# Patient Record
Sex: Female | Born: 1970 | Race: Black or African American | Hispanic: No | Marital: Married | Smoking: Never smoker
Health system: Southern US, Community
[De-identification: ages and names within clinical notes are randomized; demographics above are authoritative.]

## PROBLEM LIST (undated history)

## (undated) ENCOUNTER — Emergency Department (HOSPITAL_COMMUNITY): Admission: EM | Source: Home / Self Care

## (undated) DIAGNOSIS — Z8042 Family history of malignant neoplasm of prostate: Secondary | ICD-10-CM

## (undated) DIAGNOSIS — R9430 Abnormal result of cardiovascular function study, unspecified: Secondary | ICD-10-CM

## (undated) DIAGNOSIS — Z800 Family history of malignant neoplasm of digestive organs: Secondary | ICD-10-CM

## (undated) DIAGNOSIS — I209 Angina pectoris, unspecified: Secondary | ICD-10-CM

## (undated) DIAGNOSIS — D649 Anemia, unspecified: Secondary | ICD-10-CM

## (undated) DIAGNOSIS — Z803 Family history of malignant neoplasm of breast: Secondary | ICD-10-CM

## (undated) DIAGNOSIS — K5792 Diverticulitis of intestine, part unspecified, without perforation or abscess without bleeding: Secondary | ICD-10-CM

## (undated) DIAGNOSIS — C50919 Malignant neoplasm of unspecified site of unspecified female breast: Secondary | ICD-10-CM

## (undated) DIAGNOSIS — Z808 Family history of malignant neoplasm of other organs or systems: Secondary | ICD-10-CM

## (undated) DIAGNOSIS — M1990 Unspecified osteoarthritis, unspecified site: Secondary | ICD-10-CM

## (undated) HISTORY — DX: Family history of malignant neoplasm of digestive organs: Z80.0

## (undated) HISTORY — DX: Family history of malignant neoplasm of prostate: Z80.42

## (undated) HISTORY — DX: Unspecified osteoarthritis, unspecified site: M19.90

## (undated) HISTORY — DX: Family history of malignant neoplasm of other organs or systems: Z80.8

## (undated) HISTORY — DX: Malignant neoplasm of unspecified site of unspecified female breast: C50.919

## (undated) HISTORY — DX: Abnormal result of cardiovascular function study, unspecified: R94.30

## (undated) HISTORY — DX: Diverticulitis of intestine, part unspecified, without perforation or abscess without bleeding: K57.92

## (undated) HISTORY — PX: TUBAL LIGATION: SHX77

## (undated) HISTORY — PX: CARDIAC CATHETERIZATION: SHX172

## (undated) HISTORY — DX: Family history of malignant neoplasm of breast: Z80.3

---

## 1998-05-15 ENCOUNTER — Inpatient Hospital Stay (HOSPITAL_COMMUNITY): Admission: AD | Admit: 1998-05-15 | Discharge: 1998-05-15 | Payer: Self-pay | Admitting: Obstetrics

## 1998-07-02 ENCOUNTER — Emergency Department (HOSPITAL_COMMUNITY): Admission: EM | Admit: 1998-07-02 | Discharge: 1998-07-02 | Payer: Self-pay | Admitting: Emergency Medicine

## 1998-08-25 ENCOUNTER — Ambulatory Visit (HOSPITAL_COMMUNITY): Admission: RE | Admit: 1998-08-25 | Discharge: 1998-08-25 | Payer: Self-pay | Admitting: Obstetrics

## 1998-11-02 ENCOUNTER — Inpatient Hospital Stay (HOSPITAL_COMMUNITY): Admission: AD | Admit: 1998-11-02 | Discharge: 1998-11-02 | Payer: Self-pay | Admitting: Obstetrics

## 1998-11-21 ENCOUNTER — Encounter: Payer: Self-pay | Admitting: *Deleted

## 1998-11-21 ENCOUNTER — Emergency Department (HOSPITAL_COMMUNITY): Admission: EM | Admit: 1998-11-21 | Discharge: 1998-11-21 | Payer: Self-pay | Admitting: *Deleted

## 1998-12-24 ENCOUNTER — Inpatient Hospital Stay (HOSPITAL_COMMUNITY): Admission: AD | Admit: 1998-12-24 | Discharge: 1998-12-24 | Payer: Self-pay | Admitting: Obstetrics

## 1999-04-15 ENCOUNTER — Emergency Department (HOSPITAL_COMMUNITY): Admission: EM | Admit: 1999-04-15 | Discharge: 1999-04-15 | Payer: Self-pay | Admitting: *Deleted

## 1999-06-18 ENCOUNTER — Inpatient Hospital Stay (HOSPITAL_COMMUNITY): Admission: AD | Admit: 1999-06-18 | Discharge: 1999-06-18 | Payer: Self-pay | Admitting: Obstetrics

## 1999-09-29 ENCOUNTER — Encounter: Payer: Self-pay | Admitting: Obstetrics

## 1999-09-29 ENCOUNTER — Inpatient Hospital Stay (HOSPITAL_COMMUNITY): Admission: AD | Admit: 1999-09-29 | Discharge: 1999-10-02 | Payer: Self-pay | Admitting: Obstetrics

## 1999-11-20 ENCOUNTER — Emergency Department (HOSPITAL_COMMUNITY): Admission: EM | Admit: 1999-11-20 | Discharge: 1999-11-20 | Payer: Self-pay | Admitting: Emergency Medicine

## 2000-05-12 ENCOUNTER — Other Ambulatory Visit: Admission: RE | Admit: 2000-05-12 | Discharge: 2000-05-12 | Payer: Self-pay | Admitting: Obstetrics

## 2000-07-19 ENCOUNTER — Other Ambulatory Visit: Admission: RE | Admit: 2000-07-19 | Discharge: 2000-07-19 | Payer: Self-pay | Admitting: Obstetrics

## 2000-08-16 ENCOUNTER — Emergency Department (HOSPITAL_COMMUNITY): Admission: EM | Admit: 2000-08-16 | Discharge: 2000-08-16 | Payer: Self-pay | Admitting: Emergency Medicine

## 2000-11-02 ENCOUNTER — Inpatient Hospital Stay (HOSPITAL_COMMUNITY): Admission: AD | Admit: 2000-11-02 | Discharge: 2000-11-02 | Payer: Self-pay | Admitting: Obstetrics

## 2001-04-26 ENCOUNTER — Other Ambulatory Visit: Admission: RE | Admit: 2001-04-26 | Discharge: 2001-04-26 | Payer: Self-pay | Admitting: Obstetrics

## 2001-11-04 ENCOUNTER — Encounter: Payer: Self-pay | Admitting: Obstetrics

## 2001-11-04 ENCOUNTER — Inpatient Hospital Stay (HOSPITAL_COMMUNITY): Admission: AD | Admit: 2001-11-04 | Discharge: 2001-11-04 | Payer: Self-pay | Admitting: Obstetrics

## 2002-03-29 ENCOUNTER — Inpatient Hospital Stay (HOSPITAL_COMMUNITY): Admission: AD | Admit: 2002-03-29 | Discharge: 2002-03-29 | Payer: Self-pay | Admitting: Obstetrics

## 2002-06-26 ENCOUNTER — Encounter (INDEPENDENT_AMBULATORY_CARE_PROVIDER_SITE_OTHER): Payer: Self-pay | Admitting: Specialist

## 2002-06-26 ENCOUNTER — Inpatient Hospital Stay (HOSPITAL_COMMUNITY): Admission: AD | Admit: 2002-06-26 | Discharge: 2002-06-28 | Payer: Self-pay | Admitting: Obstetrics

## 2003-03-11 ENCOUNTER — Encounter: Payer: Self-pay | Admitting: Emergency Medicine

## 2003-03-11 ENCOUNTER — Emergency Department (HOSPITAL_COMMUNITY): Admission: EM | Admit: 2003-03-11 | Discharge: 2003-03-11 | Payer: Self-pay | Admitting: Emergency Medicine

## 2003-10-05 ENCOUNTER — Emergency Department (HOSPITAL_COMMUNITY): Admission: AD | Admit: 2003-10-05 | Discharge: 2003-10-05 | Payer: Self-pay | Admitting: Family Medicine

## 2004-09-14 ENCOUNTER — Emergency Department (HOSPITAL_COMMUNITY): Admission: EM | Admit: 2004-09-14 | Discharge: 2004-09-14 | Payer: Self-pay | Admitting: *Deleted

## 2005-03-17 ENCOUNTER — Emergency Department (HOSPITAL_COMMUNITY): Admission: EM | Admit: 2005-03-17 | Discharge: 2005-03-18 | Payer: Self-pay | Admitting: Emergency Medicine

## 2005-05-28 ENCOUNTER — Emergency Department (HOSPITAL_COMMUNITY): Admission: EM | Admit: 2005-05-28 | Discharge: 2005-05-28 | Payer: Self-pay | Admitting: Family Medicine

## 2005-06-17 ENCOUNTER — Emergency Department (HOSPITAL_COMMUNITY): Admission: EM | Admit: 2005-06-17 | Discharge: 2005-06-17 | Payer: Self-pay | Admitting: Family Medicine

## 2006-02-17 ENCOUNTER — Emergency Department (HOSPITAL_COMMUNITY): Admission: EM | Admit: 2006-02-17 | Discharge: 2006-02-17 | Payer: Self-pay | Admitting: Emergency Medicine

## 2006-03-19 ENCOUNTER — Emergency Department (HOSPITAL_COMMUNITY): Admission: EM | Admit: 2006-03-19 | Discharge: 2006-03-20 | Payer: Self-pay | Admitting: Emergency Medicine

## 2006-11-01 ENCOUNTER — Emergency Department (HOSPITAL_COMMUNITY): Admission: EM | Admit: 2006-11-01 | Discharge: 2006-11-01 | Payer: Self-pay | Admitting: Family Medicine

## 2006-12-23 ENCOUNTER — Emergency Department (HOSPITAL_COMMUNITY): Admission: EM | Admit: 2006-12-23 | Discharge: 2006-12-23 | Payer: Self-pay | Admitting: Family Medicine

## 2007-08-13 ENCOUNTER — Emergency Department (HOSPITAL_COMMUNITY): Admission: EM | Admit: 2007-08-13 | Discharge: 2007-08-13 | Payer: Self-pay | Admitting: Family Medicine

## 2007-11-16 ENCOUNTER — Emergency Department (HOSPITAL_COMMUNITY): Admission: EM | Admit: 2007-11-16 | Discharge: 2007-11-16 | Payer: Self-pay | Admitting: Emergency Medicine

## 2008-01-09 ENCOUNTER — Emergency Department (HOSPITAL_COMMUNITY): Admission: EM | Admit: 2008-01-09 | Discharge: 2008-01-09 | Payer: Self-pay | Admitting: Family Medicine

## 2008-02-27 ENCOUNTER — Emergency Department (HOSPITAL_COMMUNITY): Admission: EM | Admit: 2008-02-27 | Discharge: 2008-02-28 | Payer: Self-pay | Admitting: Emergency Medicine

## 2008-05-03 ENCOUNTER — Emergency Department (HOSPITAL_COMMUNITY): Admission: EM | Admit: 2008-05-03 | Discharge: 2008-05-03 | Payer: Self-pay | Admitting: Emergency Medicine

## 2008-08-09 ENCOUNTER — Emergency Department (HOSPITAL_COMMUNITY): Admission: EM | Admit: 2008-08-09 | Discharge: 2008-08-09 | Payer: Self-pay | Admitting: Family Medicine

## 2008-09-01 ENCOUNTER — Emergency Department (HOSPITAL_COMMUNITY): Admission: EM | Admit: 2008-09-01 | Discharge: 2008-09-01 | Payer: Self-pay | Admitting: Emergency Medicine

## 2008-09-08 ENCOUNTER — Inpatient Hospital Stay (HOSPITAL_COMMUNITY): Admission: AD | Admit: 2008-09-08 | Discharge: 2008-09-08 | Payer: Self-pay | Admitting: Obstetrics

## 2008-10-24 ENCOUNTER — Emergency Department (HOSPITAL_COMMUNITY): Admission: EM | Admit: 2008-10-24 | Discharge: 2008-10-24 | Payer: Self-pay | Admitting: Family Medicine

## 2008-11-01 ENCOUNTER — Emergency Department (HOSPITAL_COMMUNITY): Admission: EM | Admit: 2008-11-01 | Discharge: 2008-11-01 | Payer: Self-pay | Admitting: Emergency Medicine

## 2008-11-06 ENCOUNTER — Encounter (INDEPENDENT_AMBULATORY_CARE_PROVIDER_SITE_OTHER): Payer: Self-pay | Admitting: Family Medicine

## 2008-11-12 ENCOUNTER — Encounter (INDEPENDENT_AMBULATORY_CARE_PROVIDER_SITE_OTHER): Payer: Self-pay | Admitting: Family Medicine

## 2008-11-12 ENCOUNTER — Ambulatory Visit (HOSPITAL_COMMUNITY): Admission: RE | Admit: 2008-11-12 | Discharge: 2008-11-12 | Payer: Self-pay | Admitting: Obstetrics

## 2009-01-13 ENCOUNTER — Emergency Department (HOSPITAL_COMMUNITY): Admission: EM | Admit: 2009-01-13 | Discharge: 2009-01-13 | Payer: Self-pay | Admitting: Family Medicine

## 2009-01-14 ENCOUNTER — Ambulatory Visit: Payer: Self-pay | Admitting: Family Medicine

## 2009-01-14 DIAGNOSIS — D649 Anemia, unspecified: Secondary | ICD-10-CM

## 2009-01-15 ENCOUNTER — Ambulatory Visit: Payer: Self-pay | Admitting: Family Medicine

## 2009-01-15 ENCOUNTER — Encounter (INDEPENDENT_AMBULATORY_CARE_PROVIDER_SITE_OTHER): Payer: Self-pay | Admitting: Family Medicine

## 2009-01-15 DIAGNOSIS — E669 Obesity, unspecified: Secondary | ICD-10-CM | POA: Insufficient documentation

## 2009-01-15 DIAGNOSIS — L659 Nonscarring hair loss, unspecified: Secondary | ICD-10-CM | POA: Insufficient documentation

## 2009-01-16 LAB — CONVERTED CEMR LAB
AST: 15 units/L (ref 0–37)
Albumin: 4.3 g/dL (ref 3.5–5.2)
Alkaline Phosphatase: 49 units/L (ref 39–117)
Calcium: 8.7 mg/dL (ref 8.4–10.5)
Chloride: 111 meq/L (ref 96–112)
Cholesterol: 143 mg/dL (ref 0–200)
Creatinine, Ser: 0.83 mg/dL (ref 0.40–1.20)
HDL: 44 mg/dL (ref >39)
Hemoglobin: 10 g/dL — ABNORMAL LOW (ref 12.0–15.0)
MCV: 77 fL — ABNORMAL LOW (ref 78.0–100.0)
RBC: 4.05 M/uL (ref 3.87–5.11)
TIBC: 449 ug/dL (ref 250–470)
TSH: 1.253 microintl units/mL (ref 0.350–4.50)
Total Bilirubin: 0.8 mg/dL (ref 0.3–1.2)
Total CHOL/HDL Ratio: 3.3
Total Protein: 6.9 g/dL (ref 6.0–8.3)
Triglycerides: 42 mg/dL (ref <150)
WBC: 3.8 10*3/uL — ABNORMAL LOW (ref 4.0–10.5)

## 2009-04-07 ENCOUNTER — Emergency Department (HOSPITAL_COMMUNITY): Admission: EM | Admit: 2009-04-07 | Discharge: 2009-04-07 | Payer: Self-pay | Admitting: Emergency Medicine

## 2009-12-20 ENCOUNTER — Emergency Department (HOSPITAL_COMMUNITY): Admission: EM | Admit: 2009-12-20 | Discharge: 2009-12-20 | Payer: Self-pay | Admitting: Emergency Medicine

## 2010-03-01 ENCOUNTER — Emergency Department (HOSPITAL_COMMUNITY): Admission: EM | Admit: 2010-03-01 | Discharge: 2010-03-02 | Payer: Self-pay | Admitting: Emergency Medicine

## 2010-07-18 ENCOUNTER — Emergency Department (HOSPITAL_COMMUNITY): Admission: EM | Admit: 2010-07-18 | Discharge: 2010-07-18 | Payer: Self-pay | Admitting: Family Medicine

## 2010-10-31 ENCOUNTER — Emergency Department (HOSPITAL_COMMUNITY)
Admission: EM | Admit: 2010-10-31 | Discharge: 2010-10-31 | Payer: Self-pay | Source: Home / Self Care | Admitting: Family Medicine

## 2011-01-25 LAB — GC/CHLAMYDIA PROBE AMP, GENITAL
Chlamydia, DNA Probe: NEGATIVE
GC Probe Amp, Genital: NEGATIVE

## 2011-01-25 LAB — POCT PREGNANCY, URINE: Preg Test, Ur: NEGATIVE

## 2011-01-25 LAB — WET PREP, GENITAL: Clue Cells Wet Prep HPF POC: NONE SEEN

## 2011-01-25 LAB — POCT URINALYSIS DIPSTICK
Bilirubin Urine: NEGATIVE
Ketones, ur: NEGATIVE mg/dL
Nitrite: NEGATIVE
Protein, ur: NEGATIVE mg/dL
Urobilinogen, UA: 0.2 mg/dL (ref 0.0–1.0)

## 2011-01-28 LAB — POCT URINALYSIS DIPSTICK
Ketones, ur: NEGATIVE mg/dL
Nitrite: NEGATIVE
Specific Gravity, Urine: <=1.005 (ref 1.005–1.030)
Urobilinogen, UA: 0.2 mg/dL (ref 0.0–1.0)

## 2011-01-28 LAB — WET PREP, GENITAL: Yeast Wet Prep HPF POC: NONE SEEN

## 2011-01-28 LAB — URINE CULTURE

## 2011-01-28 LAB — GC/CHLAMYDIA PROBE AMP, GENITAL
Chlamydia, DNA Probe: NEGATIVE
GC Probe Amp, Genital: NEGATIVE

## 2011-02-02 LAB — BASIC METABOLIC PANEL WITH GFR
GFR calc Af Amer: >60 mL/min (ref >60)
GFR calc non Af Amer: >60 mL/min (ref >60)
Glucose, Bld: 97 mg/dL (ref 70–99)
Sodium: 139 meq/L (ref 135–145)

## 2011-02-02 LAB — BASIC METABOLIC PANEL
BUN: 11 mg/dL (ref 6–23)
CO2: 25 mEq/L (ref 19–32)
Calcium: 9.2 mg/dL (ref 8.4–10.5)
Chloride: 107 mEq/L (ref 96–112)
Creatinine, Ser: 0.92 mg/dL (ref 0.4–1.2)
Potassium: 3.7 mEq/L (ref 3.5–5.1)

## 2011-02-02 LAB — CBC
HCT: 27.8 % — ABNORMAL LOW (ref 36.0–46.0)
Hemoglobin: 9.3 g/dL — ABNORMAL LOW (ref 12.0–15.0)
MCHC: 33.4 g/dL (ref 30.0–36.0)
MCV: 76.1 fL — ABNORMAL LOW (ref 78.0–100.0)
Platelets: 357 10*3/uL (ref 150–400)
RBC: 3.65 MIL/uL — ABNORMAL LOW (ref 3.87–5.11)
RDW: 16.8 % — ABNORMAL HIGH (ref 11.5–15.5)
WBC: 6.8 10*3/uL (ref 4.0–10.5)

## 2011-02-02 LAB — POCT CARDIAC MARKERS
CKMB, poc: <1 ng/mL — ABNORMAL LOW (ref 1.0–8.0)
CKMB, poc: <1 ng/mL — ABNORMAL LOW (ref 1.0–8.0)
Myoglobin, poc: 89.5 ng/mL (ref 12–200)
Myoglobin, poc: 98.1 ng/mL (ref 12–200)
Troponin i, poc: <0.05 ng/mL (ref 0.00–0.09)
Troponin i, poc: <0.05 ng/mL (ref 0.00–0.09)

## 2011-03-02 ENCOUNTER — Inpatient Hospital Stay (HOSPITAL_COMMUNITY)
Admission: RE | Admit: 2011-03-02 | Discharge: 2011-03-02 | Disposition: A | Discharge status: Home / Self Care | Payer: Self-pay | Source: Ambulatory Visit | Attending: Family Medicine | Admitting: Family Medicine

## 2011-04-02 NOTE — Op Note (Signed)
   Sheila [name], DUNBAR NO.:  [id number]   MEDICAL RECORD NO.:  [id number]                   PATIENT TYPE:  INP   LOCATION:  9144                                 FACILITY:  [facility]   PHYSICIAN:  Kathreen Cosier, M.D.           DATE OF BIRTH:  1971/07/11   DATE OF PROCEDURE:  06/27/2002  DATE OF DISCHARGE:                                 OPERATIVE REPORT   PREOPERATIVE DIAGNOSIS:  Multiparity.   POSTOPERATIVE DIAGNOSIS:  Multiparity.   PROCEDURE:  Postpartum tubal ligation.   SURGEON:  Kathreen Cosier, M.D.   DESCRIPTION OF PROCEDURE:  Under general anesthesia, patient in the supine  position, abdomen prepped and draped.  Bladder emptied with a straight  catheter.  Midline subumbilical incision 1 inch long was made and carried  down to the fascia.  The fascia was cleaned.  The fascia and peritoneum were  opened with the Mayo scissors.  The left tube was grasped in the midportion  with the Babcock clamp.  The tube was traced to the fimbria.  A 0 plain  suture was placed in the mesosalpinx below the portion of the tube within  the clamp, and this was tied and approximately one inch of tube transected.  The procedure was done in the exact fashion on the other side.  The counts  were correct.  Hemostasis was satisfactory.  The abdomen was closed in  layers, the peritoneum and fascia with continuous suture of 0 Dexon, and the  skin closed with subcuticular sutures of 3-0 plain.  The patient tolerated  the procedure well and was taken to the recovery room in good condition.                                               Kathreen Cosier, M.D.    BAM/MEDQ  D:  06/27/2002  T:  06/27/2002  Job:  [id number] [id number]
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
## 2011-04-05 ENCOUNTER — Inpatient Hospital Stay (INDEPENDENT_AMBULATORY_CARE_PROVIDER_SITE_OTHER)
Admission: RE | Admit: 2011-04-05 | Discharge: 2011-04-05 | Disposition: A | Discharge status: Home / Self Care | Payer: Medicaid Other | Source: Ambulatory Visit | Attending: Emergency Medicine | Admitting: Emergency Medicine

## 2011-04-05 DIAGNOSIS — T7840XA Allergy, unspecified, initial encounter: Secondary | ICD-10-CM

## 2011-08-04 LAB — HERPES SIMPLEX VIRUS CULTURE: Culture: DETECTED

## 2011-08-16 LAB — POCT URINALYSIS DIP (DEVICE)
Bilirubin Urine: NEGATIVE
Nitrite: NEGATIVE
Operator id: 235561
Protein, ur: NEGATIVE
pH: 6.5

## 2011-08-16 LAB — GC/CHLAMYDIA PROBE AMP, GENITAL
Chlamydia, DNA Probe: NEGATIVE
GC Probe Amp, Genital: NEGATIVE

## 2011-08-16 LAB — POCT PREGNANCY, URINE: Preg Test, Ur: NEGATIVE

## 2011-08-17 LAB — URINALYSIS, ROUTINE W REFLEX MICROSCOPIC
Glucose, UA: NEGATIVE
pH: 6

## 2011-08-17 LAB — POCT PREGNANCY, URINE: Preg Test, Ur: NEGATIVE

## 2011-08-17 LAB — WET PREP, GENITAL
Trich, Wet Prep: NONE SEEN
Yeast Wet Prep HPF POC: NONE SEEN

## 2011-08-19 LAB — POCT URINALYSIS DIP (DEVICE)
Ketones, ur: NEGATIVE mg/dL
Protein, ur: NEGATIVE mg/dL
Specific Gravity, Urine: 1.01 (ref 1.005–1.030)
pH: 5.5 (ref 5.0–8.0)

## 2011-08-19 LAB — POCT PREGNANCY, URINE: Preg Test, Ur: NEGATIVE

## 2011-08-24 ENCOUNTER — Emergency Department (HOSPITAL_COMMUNITY)
Admission: EM | Admit: 2011-08-24 | Discharge: 2011-08-25 | Disposition: A | Discharge status: Home / Self Care | Payer: Medicaid Other | Attending: Emergency Medicine | Admitting: Emergency Medicine

## 2011-08-24 DIAGNOSIS — L02419 Cutaneous abscess of limb, unspecified: Secondary | ICD-10-CM | POA: Insufficient documentation

## 2011-08-24 DIAGNOSIS — M25569 Pain in unspecified knee: Secondary | ICD-10-CM | POA: Insufficient documentation

## 2012-04-15 ENCOUNTER — Encounter (HOSPITAL_COMMUNITY): Payer: Self-pay

## 2012-04-15 ENCOUNTER — Emergency Department (INDEPENDENT_AMBULATORY_CARE_PROVIDER_SITE_OTHER)
Admission: EM | Admit: 2012-04-15 | Discharge: 2012-04-15 | Disposition: A | Discharge status: Home / Self Care | Payer: Self-pay | Source: Home / Self Care | Attending: Emergency Medicine | Admitting: Emergency Medicine

## 2012-04-15 DIAGNOSIS — S139XXA Sprain of joints and ligaments of unspecified parts of neck, initial encounter: Secondary | ICD-10-CM

## 2012-04-15 DIAGNOSIS — M546 Pain in thoracic spine: Secondary | ICD-10-CM

## 2012-04-15 DIAGNOSIS — T148XXA Other injury of unspecified body region, initial encounter: Secondary | ICD-10-CM

## 2012-04-15 DIAGNOSIS — S161XXA Strain of muscle, fascia and tendon at neck level, initial encounter: Secondary | ICD-10-CM

## 2012-04-15 MED ORDER — CYCLOBENZAPRINE HCL 10 MG PO TABS: 10.0000 mg | ORAL_TABLET | Freq: Three times a day (TID) | ORAL | Status: AC | PRN

## 2012-04-15 MED ORDER — MELOXICAM 7.5 MG PO TABS: 7.5000 mg | ORAL_TABLET | Freq: Every day | ORAL | Status: DC
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
## 2012-04-15 NOTE — Discharge Instructions (Signed)
AS. Discussed if your pain persists beyond 2 weeks Dr. Dr. as you might need further evaluation and perhaps guided physical therapy. Your exam today was suggestive of multiple muscular sprains and strains. These medicines should help you with your current discomfort.   Back Pain, Adult Low back pain is very common. About 1 in 5 people have back pain.The cause of low back pain is rarely dangerous. The pain often gets better over time.About half of people with a sudden onset of back pain feel better in just 2 weeks. About 8 in 10 people feel better by 6 weeks.  CAUSES Some common causes of back pain include:  Strain of the muscles or ligaments supporting the spine.   Wear and tear (degeneration) of the spinal discs.   Arthritis.   Direct injury to the back.  DIAGNOSIS Most of the time, the direct cause of low back pain is not known.However, back pain can be treated effectively even when the exact cause of the pain is unknown.Answering your caregiver's questions about your overall health and symptoms is one of the most accurate ways to make sure the cause of your pain is not dangerous. If your caregiver needs more information, he or she may order lab work or imaging tests (X-rays or MRIs).However, even if imaging tests show changes in your back, this usually does not require surgery. HOME CARE INSTRUCTIONS For many people, back pain returns.Since low back pain is rarely dangerous, it is often a condition that people can learn to [facility] their own.   Remain active. It is stressful on the back to sit or stand in one place. Do not sit, drive, or stand in one place for more than 30 minutes at a time. Take short walks on level surfaces as soon as pain allows.Try to increase the length of time you walk each day.   Do not stay in bed.Resting more than 1 or 2 days can delay your recovery.   Do not avoid exercise or work.Your body is made to move.It is not dangerous to be active, even  though your back may hurt.Your back will likely heal faster if you return to being active before your pain is gone.   Pay attention to your body when you bend and lift. Many people have less discomfortwhen lifting if they bend their knees, keep the load close to their bodies,and avoid twisting. Often, the most comfortable positions are those that put less stress on your recovering back.   Find a comfortable position to sleep. Use a firm mattress and lie on your side with your knees slightly bent. If you lie on your back, put a pillow under your knees.   Only take over-the-counter or prescription medicines as directed by your caregiver. Over-the-counter medicines to reduce pain and inflammation are often the most helpful.Your caregiver may prescribe muscle relaxant drugs.These medicines help dull your pain so you can more quickly return to your normal activities and healthy exercise.   Put ice on the injured area.   Put ice in a plastic bag.   Place a towel between your skin and the bag.   Leave the ice on for 15 to 20 minutes, 3 to 4 times a day for the first 2 to 3 days. After that, ice and heat may be alternated to reduce pain and spasms.   Ask your caregiver about trying back exercises and gentle massage. This may be of some benefit.   Avoid feeling anxious or stressed.Stress increases muscle tension and can  worsen back pain.It is important to recognize when you are anxious or stressed and learn ways to manage it.Exercise is a great option.  SEEK MEDICAL CARE IF:  You have pain that is not relieved with rest or medicine.   You have pain that does not improve in 1 week.   You have new symptoms.   You are generally not feeling well.  SEEK IMMEDIATE MEDICAL CARE IF:   You have pain that radiates from your back into your legs.   You develop new bowel or bladder control problems.   You have unusual weakness or numbness in your arms or legs.   You develop nausea or vomiting.    You develop abdominal pain.   You feel faint.  Document Released: 11/01/2005 Document Revised: 10/21/2011 Document Reviewed: 03/22/2011 [facility] Patient Information 2012 [place], [place].

## 2012-05-25 ENCOUNTER — Encounter (HOSPITAL_COMMUNITY): Payer: Self-pay | Admitting: Emergency Medicine

## 2012-05-25 DIAGNOSIS — R110 Nausea: Secondary | ICD-10-CM | POA: Insufficient documentation

## 2012-05-25 DIAGNOSIS — R51 Headache: Secondary | ICD-10-CM | POA: Insufficient documentation

## 2012-05-25 DIAGNOSIS — R209 Unspecified disturbances of skin sensation: Secondary | ICD-10-CM | POA: Insufficient documentation

## 2012-05-25 DIAGNOSIS — F411 Generalized anxiety disorder: Secondary | ICD-10-CM | POA: Insufficient documentation

## 2012-05-25 DIAGNOSIS — R61 Generalized hyperhidrosis: Secondary | ICD-10-CM | POA: Insufficient documentation

## 2012-05-25 DIAGNOSIS — R064 Hyperventilation: Secondary | ICD-10-CM | POA: Insufficient documentation

## 2012-05-25 LAB — CBC WITH DIFFERENTIAL/PLATELET
Basophils Absolute: 0 10*3/uL (ref 0.0–0.1)
Eosinophils Relative: 2 % (ref 0–5)
HCT: 32.2 % — ABNORMAL LOW (ref 36.0–46.0)
Lymphocytes Relative: 26 % (ref 12–46)
MCV: 77.4 fL — ABNORMAL LOW (ref 78.0–100.0)
Monocytes Absolute: 0.7 10*3/uL (ref 0.1–1.0)
RDW: 15.4 % (ref 11.5–15.5)
WBC: 7.4 10*3/uL (ref 4.0–10.5)

## 2012-05-25 LAB — POCT I-STAT, CHEM 8
BUN: 13 mg/dL (ref 6–23)
Calcium, Ion: 1.23 mmol/L (ref 1.12–1.23)
Creatinine, Ser: 1 mg/dL (ref 0.50–1.10)
Glucose, Bld: 95 mg/dL (ref 70–99)
TCO2: 23 mmol/L (ref 0–100)

## 2012-05-25 NOTE — ED Notes (Signed)
Patient with shortness of breath and sudden onset of sharp pain and headache.  Patient states she felt sweaty and feels like she is going to pass out.

## 2012-05-26 ENCOUNTER — Encounter (HOSPITAL_COMMUNITY): Payer: Self-pay | Admitting: Radiology

## 2012-05-26 ENCOUNTER — Emergency Department (HOSPITAL_COMMUNITY): Payer: Medicaid Other

## 2012-05-26 ENCOUNTER — Emergency Department (HOSPITAL_COMMUNITY)
Admission: EM | Admit: 2012-05-26 | Discharge: 2012-05-26 | Disposition: A | Discharge status: Home / Self Care | Payer: Medicaid Other | Attending: Emergency Medicine | Admitting: Emergency Medicine

## 2012-05-26 DIAGNOSIS — R51 Headache: Secondary | ICD-10-CM

## 2012-05-26 HISTORY — DX: Anemia, unspecified: D64.9

## 2012-05-26 LAB — POCT I-STAT TROPONIN I: Troponin i, poc: 0 ng/mL (ref 0.00–0.08)

## 2012-05-26 NOTE — ED Notes (Signed)
Called pt to re-assess.  No response.  Will call again.
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
## 2012-05-30 ENCOUNTER — Other Ambulatory Visit (HOSPITAL_COMMUNITY): Payer: Self-pay | Admitting: Obstetrics

## 2012-05-30 DIAGNOSIS — Z1231 Encounter for screening mammogram for malignant neoplasm of breast: Secondary | ICD-10-CM

## 2012-06-16 ENCOUNTER — Ambulatory Visit (HOSPITAL_COMMUNITY): Payer: Medicaid Other | Attending: Obstetrics

## 2012-08-14 ENCOUNTER — Ambulatory Visit (HOSPITAL_COMMUNITY): Payer: Medicaid Other

## 2012-08-15 ENCOUNTER — Ambulatory Visit (HOSPITAL_COMMUNITY)
Admission: RE | Admit: 2012-08-15 | Discharge: 2012-08-15 | Disposition: A | Discharge status: Home / Self Care | Payer: Medicaid Other | Source: Ambulatory Visit | Attending: Obstetrics | Admitting: Obstetrics

## 2012-08-15 DIAGNOSIS — Z1231 Encounter for screening mammogram for malignant neoplasm of breast: Secondary | ICD-10-CM | POA: Insufficient documentation

## 2013-04-25 ENCOUNTER — Encounter (HOSPITAL_COMMUNITY): Payer: Self-pay | Admitting: Emergency Medicine

## 2013-04-25 ENCOUNTER — Emergency Department (HOSPITAL_COMMUNITY)
Admission: EM | Admit: 2013-04-25 | Discharge: 2013-04-25 | Disposition: A | Discharge status: Home / Self Care | Payer: Medicaid Other | Source: Home / Self Care

## 2013-04-25 DIAGNOSIS — IMO0002 Reserved for concepts with insufficient information to code with codable children: Secondary | ICD-10-CM

## 2013-04-25 DIAGNOSIS — S43401A Unspecified sprain of right shoulder joint, initial encounter: Secondary | ICD-10-CM

## 2013-04-25 MED ORDER — HYDROCODONE-ACETAMINOPHEN 2.5-500 MG PO TABS: 1.0000 | ORAL_TABLET | Freq: Four times a day (QID) | ORAL | Status: DC | PRN

## 2013-04-25 MED ORDER — NAPROXEN 375 MG PO TABS: 375.0000 mg | ORAL_TABLET | Freq: Two times a day (BID) | ORAL | Status: DC

## 2013-04-25 NOTE — ED Provider Notes (Signed)
History     CSN: [id number]  Arrival date & time 04/25/13  1855   None     Chief Complaint  Patient presents with  . Shoulder Pain    (Consider location/radiation/quality/duration/timing/severity/associated sxs/prior treatment) Patient is a 42 y.o. female presenting with shoulder pain.  Shoulder Pain   This is a 42 year old female who injured her left shoulder while trying to move a mattress a few weeks ago. She states that it is not improving in fact the pain is worse now than it has been in the past few weeks. Pain is essentially in the upper part of the shoulder and radiates down the arm to her hand. Past Medical History  Diagnosis Date  . Anemia     History reviewed. No pertinent past surgical history.  No family history on file.  History  Substance Use Topics  . Smoking status: Never Smoker   . Smokeless tobacco: Not on file  . Alcohol Use: No    OB History   Grav Para Term Preterm Abortions TAB SAB Ect Mult Living                  Review of Systems  Constitutional: Negative.   HENT: Negative.   Eyes: Negative.   Respiratory: Negative.   Cardiovascular: Negative.   Gastrointestinal: Negative.   Genitourinary: Negative.   Skin: Negative.   Neurological: Negative.   Hematological: Negative.   Psychiatric/Behavioral: Negative.     Allergies  Review of patient's allergies indicates no known allergies.  Home Medications   Current Outpatient Rx  Name  Route  Sig  Dispense  Refill  . HYDROcodone-acetaminophen (VICODIN) 2.5-500 MG per tablet   Oral   Take 1 tablet by mouth every 6 (six) hours as needed for pain.   30 tablet   0   . naproxen (NAPROSYN) 375 MG tablet   Oral   Take 1 tablet (375 mg total) by mouth 2 (two) times daily with a meal.   60 tablet   0     BP 149/88  Pulse 90  Temp(Src) 98.8 F (37.1 C) (Oral)  Resp 18  SpO2 98%  LMP 04/15/2013  Physical Exam  Constitutional: She is oriented to person, place, and time. She  appears well-developed and well-nourished.  HENT:  Head: Normocephalic and atraumatic.  Eyes: Conjunctivae are normal. Pupils are equal, round, and reactive to light.  Neck: Normal range of motion. Neck supple.  Cardiovascular: Normal rate and regular rhythm.   Pulmonary/Chest: Effort normal and breath sounds normal.  Abdominal: Soft. Bowel sounds are normal.  Musculoskeletal:  Tenderness in the upper outer aspect of left shoulder is noted. There is no mass. Patient is able to lift her arm above her head although this causes a significant exacerbation in her pain. She is also able to flex and extend at the shoulder without any significant difficulty other than moderate pain.  Neurological: She is alert and oriented to person, place, and time.  Skin: Skin is warm.  Psychiatric: She has a normal mood and affect. Her behavior is normal.    ED Course  Procedures (including critical care time)  Labs Reviewed - No data to display No results found.   1. Shoulder sprain, right, initial encounter       MDM  Naprosyn, Vicodin for pain and inflammation. I've also ordered a sling and given general instructions regarding rest and ice. Followup with orthopod if improvement is not noted over the next one to 2 weeks.  Calvert Cantor, MD 04/25/13 1945

## 2013-04-25 NOTE — ED Notes (Signed)
Left shoulder pain for one week, pain has been increasing over the week. Patient reports pain onset after moving her bed/mattress. Left hand numbness and tingling

## 2013-06-26 ENCOUNTER — Inpatient Hospital Stay (HOSPITAL_COMMUNITY)
Admission: AD | Admit: 2013-06-26 | Discharge: 2013-06-26 | Disposition: A | Discharge status: Home / Self Care | Payer: Medicaid Other | Source: Ambulatory Visit | Attending: Obstetrics and Gynecology | Admitting: Obstetrics and Gynecology

## 2013-06-26 ENCOUNTER — Inpatient Hospital Stay (HOSPITAL_COMMUNITY): Payer: Medicaid Other

## 2013-06-26 ENCOUNTER — Encounter (HOSPITAL_COMMUNITY): Payer: Self-pay | Admitting: *Deleted

## 2013-06-26 DIAGNOSIS — M545 Low back pain, unspecified: Secondary | ICD-10-CM | POA: Insufficient documentation

## 2013-06-26 DIAGNOSIS — R109 Unspecified abdominal pain: Secondary | ICD-10-CM | POA: Insufficient documentation

## 2013-06-26 DIAGNOSIS — N926 Irregular menstruation, unspecified: Secondary | ICD-10-CM

## 2013-06-26 LAB — URINE MICROSCOPIC-ADD ON

## 2013-06-26 LAB — WET PREP, GENITAL
Clue Cells Wet Prep HPF POC: NONE SEEN
Trich, Wet Prep: NONE SEEN

## 2013-06-26 LAB — URINALYSIS, ROUTINE W REFLEX MICROSCOPIC
Bilirubin Urine: NEGATIVE
Glucose, UA: 100 mg/dL — AB
Specific Gravity, Urine: 1.015 (ref 1.005–1.030)
pH: 6 (ref 5.0–8.0)

## 2013-06-26 MED ORDER — HYDROCODONE-ACETAMINOPHEN 5-325 MG PO TABS: 1.0000 | ORAL_TABLET | ORAL | Status: DC | PRN

## 2013-06-26 MED ORDER — KETOROLAC TROMETHAMINE 60 MG/2ML IM SOLN
60.0000 mg | Freq: Once | INTRAMUSCULAR | Status: AC
  Administered 2013-06-26: 60 mg via INTRAMUSCULAR
  Filled 2013-06-26: qty 2

## 2013-06-26 NOTE — MAU Note (Signed)
Cramping like period is going to start, low abd, side and low back, period was due July 21- has not come on.  Tubes are tied.  Has been nauseated.
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
## 2013-06-26 NOTE — MAU Note (Signed)
Very anxious and fearful of being pregnant; hx of BTL 11 yrs ago; c/o abdominal cramping and lower back pain; pain is worse on the R but is bilateral;

## 2013-06-26 NOTE — Discharge Instructions (Signed)

## 2013-06-27 LAB — GC/CHLAMYDIA PROBE AMP
CT Probe RNA: NEGATIVE
GC Probe RNA: NEGATIVE

## 2014-01-12 ENCOUNTER — Other Ambulatory Visit (HOSPITAL_COMMUNITY)
Admission: RE | Admit: 2014-01-12 | Discharge: 2014-01-12 | Disposition: A | Discharge status: Home / Self Care | Payer: Medicaid Other | Source: Ambulatory Visit | Attending: Emergency Medicine | Admitting: Emergency Medicine

## 2014-01-12 ENCOUNTER — Encounter (HOSPITAL_COMMUNITY): Payer: Self-pay | Admitting: Emergency Medicine

## 2014-01-12 ENCOUNTER — Emergency Department (HOSPITAL_COMMUNITY)
Admission: EM | Admit: 2014-01-12 | Discharge: 2014-01-12 | Disposition: A | Discharge status: Home / Self Care | Payer: Medicaid Other | Source: Home / Self Care | Attending: Emergency Medicine | Admitting: Emergency Medicine

## 2014-01-12 DIAGNOSIS — N949 Unspecified condition associated with female genital organs and menstrual cycle: Secondary | ICD-10-CM

## 2014-01-12 DIAGNOSIS — N760 Acute vaginitis: Secondary | ICD-10-CM | POA: Insufficient documentation

## 2014-01-12 DIAGNOSIS — R102 Pelvic and perineal pain: Secondary | ICD-10-CM

## 2014-01-12 DIAGNOSIS — R1031 Right lower quadrant pain: Secondary | ICD-10-CM

## 2014-01-12 DIAGNOSIS — Z113 Encounter for screening for infections with a predominantly sexual mode of transmission: Secondary | ICD-10-CM | POA: Insufficient documentation

## 2014-01-12 LAB — POCT URINALYSIS DIP (DEVICE)
Bilirubin Urine: NEGATIVE
GLUCOSE, UA: NEGATIVE mg/dL
Ketones, ur: NEGATIVE mg/dL
Leukocytes, UA: NEGATIVE
NITRITE: NEGATIVE
Protein, ur: NEGATIVE mg/dL
SPECIFIC GRAVITY, URINE: 1.02 (ref 1.005–1.030)
UROBILINOGEN UA: 0.2 mg/dL (ref 0.0–1.0)
pH: 5.5 (ref 5.0–8.0)

## 2014-01-12 LAB — POCT PREGNANCY, URINE: PREG TEST UR: NEGATIVE

## 2014-01-12 NOTE — ED Notes (Signed)
Pt  Ambulated  To  Treatment  Room  With a  Steady  Fluid  Gait    She  Reports  Back  Pain  For  Several  Days          She  denys  Any  Urinary  Symptoms       She  Is  Sitting  Upright on  Exam table  In  No  Acute  Distress

## 2014-01-12 NOTE — ED Provider Notes (Signed)
CSN: [id number]     Arrival date & time 01/12/14  0919 History   First MD Initiated Contact with Patient 01/12/14 1029     Chief Complaint  Patient presents with  . Back Pain   (Consider location/radiation/quality/duration/timing/severity/associated sxs/prior Treatment) Patient is a 43 y.o. female presenting with abdominal pain. The history is provided by the patient.  Abdominal Pain Pain location:  RLQ Pain quality: aching   Pain radiates to:  Back Pain severity:  Mild Onset quality:  Gradual Duration: several months. Timing:  Constant Progression:  Worsening Chronicity: new as of several months ago. Relieved by:  None tried Worsened by:  Palpation (pain worse with sexual intercourse too) Ineffective treatments:  None tried Associated symptoms: vaginal discharge   Associated symptoms: no chills, no constipation, no diarrhea, no dysuria, no fever, no nausea and no vomiting   Associated symptoms comment:  Pt feels has had vaginal discharge for last 2 days Risk factors: not pregnant   Risk factors comment:  Has no hx abd surgery   Past Medical History  Diagnosis Date  . Anemia    Past Surgical History  Procedure Laterality Date  . Tubal ligation     Family History  Problem Relation Age of Onset  . Hypertension Mother   . Diabetes Father   . Hypertension Maternal Aunt   . Hypertension Maternal Uncle   . Cancer Maternal Grandmother   . Hypertension Maternal Grandmother    History  Substance Use Topics  . Smoking status: Never Smoker   . Smokeless tobacco: Not on file  . Alcohol Use: No   OB History   Grav Para Term Preterm Abortions TAB SAB Ect Mult Living   8 6 4 2 2  2   4      Review of Systems  Constitutional: Negative for fever and chills.  Gastrointestinal: Positive for abdominal pain. Negative for nausea, vomiting, diarrhea and constipation.  Genitourinary: Positive for vaginal discharge and pelvic pain. Negative for dysuria, flank pain and genital sores.     Allergies  Review of patient's allergies indicates no known allergies.  Home Medications   Current Outpatient Rx  Name  Route  Sig  Dispense  Refill  . HYDROcodone-acetaminophen (NORCO/VICODIN) 5-325 MG per tablet   Oral   Take 1-2 tablets by mouth every 4 (four) hours as needed for pain.   10 tablet   0    BP 110/80  Pulse 78  Temp(Src) 98.4 F (36.9 C) (Oral)  Resp 17  SpO2 100%  LMP 12/25/2013 Physical Exam  Constitutional: She appears well-developed and well-nourished. No distress.  Cardiovascular: Normal rate and regular rhythm.   Pulmonary/Chest: Effort normal and breath sounds normal.  Abdominal: Normal appearance and bowel sounds are normal. She exhibits no distension. There is no hepatosplenomegaly. There is tenderness in the right lower quadrant. There is no rigidity, no rebound, no guarding, no CVA tenderness, no tenderness at McBurney's point and negative Murphy's sign.  Genitourinary: Vagina normal. There is no rash, tenderness or lesion on the right labia. There is no rash, tenderness or lesion on the left labia. Cervix exhibits no discharge. Right adnexum displays tenderness. Left adnexum displays no mass, no tenderness and no fullness. No vaginal discharge found.  Pt feels pain in R adnexa with motion of cervix.   Lymphadenopathy:       Right: No inguinal adenopathy present.       Left: No inguinal adenopathy present.    ED Course  Procedures (including critical care  time) Labs Review Labs Reviewed  POCT URINALYSIS DIP (DEVICE) - Abnormal; Notable for the following:    Hgb urine dipstick TRACE (*)    All other components within normal limits  POCT PREGNANCY, URINE  CERVICOVAGINAL ANCILLARY ONLY   Imaging Review No results found.   MDM   1. Pelvic pain   2. RLQ abdominal pain   Pelvic pain for several months R side. Had pelvic US 06/2013 that showed ovaries nl, possible adenomyosis.  Nothing acute today- I suggested pt f/u with her Gyn, Dr.  Ruthann Cancer. Pt to try ibuprofen or aleve for the pain and f/u with gyn.      Carvel Getting, NP 01/12/14 1038

## 2014-01-12 NOTE — ED Provider Notes (Signed)
Medical screening examination/treatment/procedure(s) were performed by non-physician practitioner and as supervising physician I was immediately available for consultation/collaboration.  Philipp Deputy, M.D.  Harden Mo, MD 01/12/14 2022

## 2014-01-12 NOTE — Discharge Instructions (Signed)
Try aleve or ibuprofen as directed on the package for pain. Follow up with Dr. Ruthann Cancer.    Pelvic Pain, Female Female pelvic pain can be caused by many different things and start from a variety of places. Pelvic pain refers to pain that is located in the lower half of the abdomen and between your hips. The pain may occur over a short period of time (acute) or may be reoccurring (chronic). The cause of pelvic pain may be related to disorders affecting the female reproductive organs (gynecologic), but it may also be related to the bladder, kidney stones, an intestinal complication, or muscle or skeletal problems. Getting help right away for pelvic pain is important, especially if there has been severe, sharp, or a sudden onset of unusual pain. It is also important to get help right away because some types of pelvic pain can be life threatening.  CAUSES  Below are only some of the causes of pelvic pain. The causes of pelvic pain can be in one of several categories.   Gynecologic.  Pelvic inflammatory disease.  Sexually transmitted infection.  Ovarian cyst or a twisted ovarian ligament (ovarian torsion).  Uterine lining that grows outside the uterus (endometriosis).  Fibroids, cysts, or tumors.  Ovulation.  Pregnancy.  Pregnancy that occurs outside the uterus (ectopic pregnancy).  Miscarriage.  Labor.  Abruption of the placenta or ruptured uterus.  Infection.  Uterine infection (endometritis).  Bladder infection.  Diverticulitis.  Miscarriage related to a uterine infection (septic abortion).  Bladder.  Inflammation of the bladder (cystitis).  Kidney stone(s).  Gastrointenstinal.  Constipation.  Diverticulitis.  Neurologic.  Trauma.  Feeling pelvic pain because of mental or emotional causes (psychosomatic).  Cancers of the bowel or pelvis. EVALUATION  Your caregiver will want to take a careful history of your concerns. This includes recent changes in your  health, a careful gynecologic history of your periods (menses), and a sexual history. Obtaining your family history and medical history is also important. Your caregiver may suggest a pelvic exam. A pelvic exam will help identify the location and severity of the pain. It also helps in the evaluation of which organ system may be involved. In order to identify the cause of the pelvic pain and be properly treated, your caregiver may order tests. These tests may include:   A pregnancy test.  Pelvic ultrasonography.  An X-ray exam of the abdomen.  A urinalysis or evaluation of vaginal discharge.  Blood tests. HOME CARE INSTRUCTIONS   Only take over-the-counter or prescription medicines for pain, discomfort, or fever as directed by your caregiver.   Rest as directed by your caregiver.   Eat a balanced diet.   Drink enough fluids to make your urine clear or pale yellow, or as directed.   Avoid sexual intercourse if it causes pain.   Apply warm or cold compresses to the lower abdomen depending on which one helps the pain.   Avoid stressful situations.   Keep a journal of your pelvic pain. Write down when it started, where the pain is located, and if there are things that seem to be associated with the pain, such as food or your menstrual cycle.  Follow up with your caregiver as directed.  SEEK MEDICAL CARE IF:  Your medicine does not help your pain.  You have abnormal vaginal discharge. SEEK IMMEDIATE MEDICAL CARE IF:   You have heavy bleeding from the vagina.   Your pelvic pain increases.   You feel lightheaded or faint.   You  have chills.   You have pain with urination or blood in your urine.   You have uncontrolled diarrhea or vomiting.   You have a fever or persistent symptoms for more than 3 days.  You have a fever and your symptoms suddenly get worse.   You are being physically or sexually abused.  MAKE SURE YOU:  Understand these  instructions.  Will watch your condition.  Will get help if you are not doing well or get worse. Document Released: 09/28/2004 Document Revised: 05/02/2012 Document Reviewed: 02/21/2012 [facility] Patient Information 2014 [place], [place].

## 2014-01-14 LAB — CERVICOVAGINAL ANCILLARY ONLY
Chlamydia: NEGATIVE
NEISSERIA GONORRHEA: NEGATIVE
WET PREP (BD AFFIRM): POSITIVE — AB
Wet Prep (BD Affirm): NEGATIVE
Wet Prep (BD Affirm): NEGATIVE

## 2014-01-16 ENCOUNTER — Telehealth (HOSPITAL_COMMUNITY): Payer: Self-pay | Admitting: *Deleted

## 2014-01-16 NOTE — ED Notes (Signed)
GC/Chlamydia neg., Affirm: Candida pos., Gardnerella and Trich neg.,  Message sent to Dr. Jake Michaelis. Roselyn Meier 01/16/2014

## 2014-01-17 MED ORDER — FLUCONAZOLE 150 MG PO TABS: 150.0000 mg | ORAL_TABLET | Freq: Once | ORAL | Status: DC

## 2014-01-17 NOTE — ED Notes (Signed)
DNA probe was positive for Candida. We'll treat with Diflucan 150 mg, #1, one tablet one time only. Will need to call the patient and inform her of this result.   Harden Mo, MD 01/17/14 1250

## 2014-01-17 NOTE — Telephone Encounter (Signed)
Message copied by Harden Mo on Thu Jan 17, 2014 12:50 PM ------      Message from: Roselyn Meier      Created: Wed Jan 16, 2014  9:17 PM      Regarding: labs       Candida pos., Rest of labs neg.  Pt. of Willeen Cass NP and you co-signed.  Did not see any treatment for this.      Roselyn Meier      01/16/2014       ------

## 2014-01-17 NOTE — ED Notes (Signed)
I called pt. Pt. verified x 2 and given results.  Pt. told she needs Diflucan for a yeast infection and where to pick up her Rx.  Pt. voiced understanding. Roselyn Meier 01/17/2014

## 2014-02-22 ENCOUNTER — Emergency Department (HOSPITAL_COMMUNITY)
Admission: EM | Admit: 2014-02-22 | Discharge: 2014-02-22 | Disposition: A | Discharge status: Home / Self Care | Payer: Medicaid Other | Attending: Emergency Medicine | Admitting: Emergency Medicine

## 2014-02-22 ENCOUNTER — Encounter (HOSPITAL_COMMUNITY): Payer: Self-pay | Admitting: Emergency Medicine

## 2014-02-22 DIAGNOSIS — Y939 Activity, unspecified: Secondary | ICD-10-CM | POA: Insufficient documentation

## 2014-02-22 DIAGNOSIS — S61509A Unspecified open wound of unspecified wrist, initial encounter: Secondary | ICD-10-CM | POA: Insufficient documentation

## 2014-02-22 DIAGNOSIS — Z862 Personal history of diseases of the blood and blood-forming organs and certain disorders involving the immune mechanism: Secondary | ICD-10-CM | POA: Insufficient documentation

## 2014-02-22 DIAGNOSIS — Y929 Unspecified place or not applicable: Secondary | ICD-10-CM | POA: Insufficient documentation

## 2014-02-22 DIAGNOSIS — W503XXA Accidental bite by another person, initial encounter: Secondary | ICD-10-CM | POA: Insufficient documentation

## 2014-02-22 MED ORDER — TRAMADOL HCL 50 MG PO TABS
50.0000 mg | ORAL_TABLET | Freq: Once | ORAL | Status: AC
  Administered 2014-02-22: 50 mg via ORAL
  Filled 2014-02-22: qty 1

## 2014-02-22 MED ORDER — TRAMADOL HCL 50 MG PO TABS
50.0000 mg | ORAL_TABLET | Freq: Four times a day (QID) | ORAL | Status: DC | PRN
Start: 2014-02-22 — End: 2014-04-03

## 2014-02-22 MED ORDER — AMOXICILLIN-POT CLAVULANATE 875-125 MG PO TABS
1.0000 | ORAL_TABLET | Freq: Two times a day (BID) | ORAL | Status: DC
Start: 2014-02-22 — End: 2014-04-03

## 2014-02-22 MED ORDER — AMOXICILLIN-POT CLAVULANATE 875-125 MG PO TABS
1.0000 | ORAL_TABLET | Freq: Once | ORAL | Status: AC
  Administered 2014-02-22: 1 via ORAL
  Filled 2014-02-22: qty 1
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
## 2014-02-22 NOTE — Discharge Instructions (Signed)
Take augmentin as directed until gone. Take Tramadol as needed for pain. Refer to attached documents for more information. Return to the ED with worsening or concerning symptoms.

## 2014-02-23 NOTE — ED Provider Notes (Signed)
Medical screening examination/treatment/procedure(s) were performed by non-physician practitioner and as supervising physician I was immediately available for consultation/collaboration.   EKG Interpretation None        Wandra Arthurs, MD 02/23/14 1048

## 2014-04-03 ENCOUNTER — Emergency Department (HOSPITAL_COMMUNITY)
Admission: EM | Admit: 2014-04-03 | Discharge: 2014-04-04 | Disposition: A | Discharge status: Home / Self Care | Payer: Medicaid Other | Attending: Emergency Medicine | Admitting: Emergency Medicine

## 2014-04-03 ENCOUNTER — Encounter (HOSPITAL_COMMUNITY): Payer: Self-pay | Admitting: Emergency Medicine

## 2014-04-03 ENCOUNTER — Emergency Department (HOSPITAL_COMMUNITY): Payer: Medicaid Other

## 2014-04-03 DIAGNOSIS — Z79899 Other long term (current) drug therapy: Secondary | ICD-10-CM | POA: Insufficient documentation

## 2014-04-03 DIAGNOSIS — R209 Unspecified disturbances of skin sensation: Secondary | ICD-10-CM | POA: Insufficient documentation

## 2014-04-03 DIAGNOSIS — R079 Chest pain, unspecified: Secondary | ICD-10-CM | POA: Insufficient documentation

## 2014-04-03 DIAGNOSIS — Z862 Personal history of diseases of the blood and blood-forming organs and certain disorders involving the immune mechanism: Secondary | ICD-10-CM | POA: Insufficient documentation

## 2014-04-03 LAB — CBC
HCT: 28 % — ABNORMAL LOW (ref 36.0–46.0)
Hemoglobin: 8.6 g/dL — ABNORMAL LOW (ref 12.0–15.0)
MCH: 20.9 pg — AB (ref 26.0–34.0)
MCHC: 30.7 g/dL (ref 30.0–36.0)
MCV: 68 fL — AB (ref 78.0–100.0)
PLATELETS: 324 10*3/uL (ref 150–400)
RBC: 4.12 MIL/uL (ref 3.87–5.11)
RDW: 16.9 % — AB (ref 11.5–15.5)
WBC: 6 10*3/uL (ref 4.0–10.5)

## 2014-04-03 LAB — BASIC METABOLIC PANEL
BUN: 10 mg/dL (ref 6–23)
CALCIUM: 9.2 mg/dL (ref 8.4–10.5)
CO2: 23 mEq/L (ref 19–32)
CREATININE: 0.9 mg/dL (ref 0.50–1.10)
Chloride: 104 mEq/L (ref 96–112)
GFR calc non Af Amer: 76 mL/min — ABNORMAL LOW (ref >90)
GFR, EST AFRICAN AMERICAN: 88 mL/min — AB (ref >90)
Glucose, Bld: 89 mg/dL (ref 70–99)
Potassium: 4 mEq/L (ref 3.7–5.3)
Sodium: 139 mEq/L (ref 137–147)

## 2014-04-03 LAB — I-STAT TROPONIN, ED: TROPONIN I, POC: 0 ng/mL (ref 0.00–0.08)

## 2014-04-03 MED ORDER — ASPIRIN 81 MG PO CHEW
324.0000 mg | CHEWABLE_TABLET | Freq: Once | ORAL | Status: AC
  Administered 2014-04-03: 324 mg via ORAL
  Filled 2014-04-03: qty 4

## 2014-04-03 NOTE — ED Provider Notes (Signed)
CSN: [id number]     Arrival date & time 04/03/14  1835 History   First MD Initiated Contact with Patient 04/03/14 2316     Chief Complaint  Patient presents with  . Chest Pain  . Tingling     (Consider location/radiation/quality/duration/timing/severity/associated sxs/prior Treatment) Patient is a 43 y.o. female presenting with chest pain. The history is provided by the patient.  Chest Pain She had onset about 30 minutes prior to arrival in the ED of sharp lower chest pain without radiation. Pain was moderate and she rated at 5/10. Nothing made it better nothing made it worse. There was associated dyspnea but no nausea or diaphoresis. She has been pain-free since arrival in the ED. She had an episode yesterday which he thinks was a panic attack. She was in her car when she felt like she couldn't breathe and she broke out in a sweat. There is no nausea vomiting or chest pain at that time. She had to call someone to get her. This morning, she woke up with numbness and tingling in her hands which has persisted through the day. She has no significant cardiac risk factors.  Past Medical History  Diagnosis Date  . Anemia    Past Surgical History  Procedure Laterality Date  . Tubal ligation     Family History  Problem Relation Age of Onset  . Hypertension Mother   . Diabetes Father   . Hypertension Maternal Aunt   . Hypertension Maternal Uncle   . Cancer Maternal Grandmother   . Hypertension Maternal Grandmother    History  Substance Use Topics  . Smoking status: Never Smoker   . Smokeless tobacco: Not on file  . Alcohol Use: No   OB History   Grav Para Term Preterm Abortions TAB SAB Ect Mult Living   8 6 4 2 2  2   4      Review of Systems  Cardiovascular: Positive for chest pain.  All other systems reviewed and are negative.     Allergies  Review of patient's allergies indicates no known allergies.  Home Medications   Prior to Admission medications   Medication Sig  Start Date End Date Taking? Authorizing Provider  albuterol (PROVENTIL HFA;VENTOLIN HFA) 108 (90 BASE) MCG/ACT inhaler Inhale 2 puffs into the lungs every 6 (six) hours as needed for wheezing or shortness of breath.   Yes Historical Provider, MD  traMADol (ULTRAM) 50 MG tablet Take 50 mg by mouth every 6 (six) hours as needed for moderate pain.   Yes Historical Provider, MD   BP 133/71  Pulse 76  Temp(Src) 99 F (37.2 C) (Oral)  Resp 20  Ht 5\' 9"  (1.753 m)  Wt 192 lb 7 oz (87.289 kg)  BMI 28.41 kg/m2  SpO2 100%  LMP 03/15/2014 Physical Exam  Nursing note and vitals reviewed.  43 year old female, resting comfortably and in no acute distress. Vital signs are normal. Oxygen saturation is 100%, which is normal. Head is normocephalic and atraumatic. PERRLA, EOMI. Oropharynx is clear. Neck is nontender and supple without adenopathy or JVD. Back is nontender and there is no CVA tenderness. Lungs are clear without rales, wheezes, or rhonchi. Chest is nontender. Heart has regular rate and rhythm without murmur. Abdomen is soft, flat, nontender without masses or hepatosplenomegaly and peristalsis is normoactive. Extremities have no cyanosis or edema, full range of motion is present. Skin is warm and dry without rash. Neurologic: Mental status is normal, cranial nerves are intact, there are no  motor or sensory deficits.  ED Course  Procedures (including critical care time) Labs Review Results for orders placed during the hospital encounter of 04/03/14  CBC      Result Value Ref Range   WBC 6.0  4.0 - 10.5 K/uL   RBC 4.12  3.87 - 5.11 MIL/uL   Hemoglobin 8.6 (*) 12.0 - 15.0 g/dL   HCT 28.0 (*) 36.0 - 46.0 %   MCV 68.0 (*) 78.0 - 100.0 fL   MCH 20.9 (*) 26.0 - 34.0 pg   MCHC 30.7  30.0 - 36.0 g/dL   RDW 16.9 (*) 11.5 - 15.5 %   Platelets 324  150 - 400 K/uL  BASIC METABOLIC PANEL      Result Value Ref Range   Sodium 139  137 - 147 mEq/L   Potassium 4.0  3.7 - 5.3 mEq/L   Chloride  104  96 - 112 mEq/L   CO2 23  19 - 32 mEq/L   Glucose, Bld 89  70 - 99 mg/dL   BUN 10  6 - 23 mg/dL   Creatinine, Ser 0.90  0.50 - 1.10 mg/dL   Calcium 9.2  8.4 - 10.5 mg/dL   GFR calc non Af Amer 76 (*) >90 mL/min   GFR calc Af Amer 88 (*) >90 mL/min  I-STAT TROPOININ, ED      Result Value Ref Range   Troponin i, poc 0.00  0.00 - 0.08 ng/mL   Comment 3           I-STAT TROPOININ, ED      Result Value Ref Range   Troponin i, poc 0.00  0.00 - 0.08 ng/mL   Comment 3            Imaging Review Dg Chest 2 View  04/03/2014   CLINICAL DATA:  Chest pain.  EXAM: CHEST  2 VIEW  COMPARISON:  A single view of the chest 03/01/2010.  FINDINGS: Heart size and mediastinal contours are within normal limits. Both lungs are clear. Visualized skeletal structures are unremarkable.  IMPRESSION: Negative exam.   Electronically Signed   By: Inge Rise M.D.   On: 04/03/2014 19:41     EKG Interpretation   Date/Time:  Wednesday Apr 03 2014 18:43:04 EDT Ventricular Rate:  89 PR Interval:  108 QRS Duration: 80 QT Interval:  348 QTC Calculation: 423 R Axis:   18 Text Interpretation:  Sinus rhythm with short PR ST \\T \ T wave  abnormality, consider inferior ischemia ST depression in Anterolateral  leads Abnormal ECG When compared with ECG of 05/25/2012, ST depression in  Anterolateral leads is now Present Confirmed by [facility]  MD, Marcquis Ridlon ([id number])  on 04/03/2014 11:19:29 PM      MDM   Final diagnoses:  Chest pain    Chest pain that does not sound especially concerning for cardiac cause. Episode yesterday which probably was panic attack. However, ECG does show some minor changes compared with ECG from 2 years ago, but she did have a pre-existing ST and T changes. Also, it is noted that she has pre-existing anemia which has worsened from 2 years ago. Hemoglobin has dropped about 2 g. Please note that recorded hemoglobin of 12.2 was probably factitious as this was done as part of an i-STAT and not a  dedicated CBC, and i-STAT hemoglobins are sometimes factitiously high. She does state that she had stopped taking her iron and she's not sure how long she has been off of her medications. She  does have cardiac risk factor of positive family history but she is a nonsmoker without history of diabetes, hyperlipidemia, or diabetes. At this point, plan is to repeat troponin and referred to cardiology for followup for her possible stress testing.  Repeat troponin is negative, and she is discharged.  Delora Fuel, MD 86/38/17 [id number]

## 2014-04-03 NOTE — ED Notes (Signed)
Patient states while driving on yesterday, she became anxious, sob, and developed tingling in her left arm/hand.  Today her sx persist and she developed chest pain prior to arrival to ED.  She points to more upper abdomen/chest as source of her pain.  Patient continues to having tingling in her hand  and blurred vision in both eyes.

## 2014-04-04 LAB — I-STAT TROPONIN, ED: Troponin i, poc: 0 ng/mL (ref 0.00–0.08)

## 2014-04-04 NOTE — ED Notes (Signed)
Dr. Glick at bedside.  

## 2014-04-04 NOTE — Discharge Instructions (Signed)
Chest Pain (Nonspecific) °It is often hard to give a specific diagnosis for the cause of chest pain. There is always a chance that your pain could be related to something serious, such as a heart attack or a blood clot in the lungs. You need to follow up with your caregiver for further evaluation. °CAUSES  °· Heartburn. °· Pneumonia or bronchitis. °· Anxiety or stress. °· Inflammation around your heart (pericarditis) or lung (pleuritis or pleurisy). °· A blood clot in the lung. °· A collapsed lung (pneumothorax). It can develop suddenly on its own (spontaneous pneumothorax) or from injury (trauma) to the chest. °· Shingles infection (herpes zoster virus). °The chest wall is composed of bones, muscles, and cartilage. Any of these can be the source of the pain. °· The bones can be bruised by injury. °· The muscles or cartilage can be strained by coughing or overwork. °· The cartilage can be affected by inflammation and become sore (costochondritis). °DIAGNOSIS  °Lab tests or other studies, such as X-rays, electrocardiography, stress testing, or cardiac imaging, may be needed to find the cause of your pain.  °TREATMENT  °· Treatment depends on what may be causing your chest pain. Treatment may include: °· Acid blockers for heartburn. °· Anti-inflammatory medicine. °· Pain medicine for inflammatory conditions. °· Antibiotics if an infection is present. °· You may be advised to change lifestyle habits. This includes stopping smoking and avoiding alcohol, caffeine, and chocolate. °· You may be advised to keep your head raised (elevated) when sleeping. This reduces the chance of acid going backward from your stomach into your esophagus. °· Most of the time, nonspecific chest pain will improve within 2 to 3 days with rest and mild pain medicine. °HOME CARE INSTRUCTIONS  °· If antibiotics were prescribed, take your antibiotics as directed. Finish them even if you start to feel better. °· For the next few days, avoid physical  activities that bring on chest pain. Continue physical activities as directed. °· Do not smoke. °· Avoid drinking alcohol. °· Only take over-the-counter or prescription medicine for pain, discomfort, or fever as directed by your caregiver. °· Follow your caregiver's suggestions for further testing if your chest pain does not go away. °· Keep any follow-up appointments you made. If you do not go to an appointment, you could develop lasting (chronic) problems with pain. If there is any problem keeping an appointment, you must call to reschedule. °SEEK MEDICAL CARE IF:  °· You think you are having problems from the medicine you are taking. Read your medicine instructions carefully. °· Your chest pain does not go away, even after treatment. °· You develop a rash with blisters on your chest. °SEEK IMMEDIATE MEDICAL CARE IF:  °· You have increased chest pain or pain that spreads to your arm, neck, jaw, back, or abdomen. °· You develop shortness of breath, an increasing cough, or you are coughing up blood. °· You have severe back or abdominal pain, feel nauseous, or vomit. °· You develop severe weakness, fainting, or chills. °· You have a fever. °THIS IS AN EMERGENCY. Do not wait to see if the pain will go away. Get medical help at once. Call your local emergency services (911 in [place] Do not drive yourself to the hospital. °MAKE SURE YOU:  °· Understand these instructions. °· Will watch your condition. °· Will get help right away if you are not doing well or get worse. °Document Released: 08/11/2005 Document Revised: 01/24/2012 Document Reviewed: 06/06/2008 °ExitCare® Patient Information ©2014 [employer]   [employer]. ° °

## 2014-04-23 ENCOUNTER — Encounter: Payer: Self-pay | Admitting: Cardiology

## 2014-04-23 DIAGNOSIS — R0789 Other chest pain: Secondary | ICD-10-CM | POA: Insufficient documentation

## 2014-04-23 DIAGNOSIS — F410 Panic disorder [episodic paroxysmal anxiety] without agoraphobia: Secondary | ICD-10-CM | POA: Insufficient documentation

## 2014-04-24 ENCOUNTER — Ambulatory Visit (INDEPENDENT_AMBULATORY_CARE_PROVIDER_SITE_OTHER): Payer: Medicaid Other | Admitting: Cardiology

## 2014-04-24 ENCOUNTER — Encounter: Payer: Self-pay | Admitting: Cardiology

## 2014-04-24 VITALS — BP 141/83 | HR 78 | Ht 69.0 in | Wt 188.4 lb

## 2014-04-24 DIAGNOSIS — F410 Panic disorder [episodic paroxysmal anxiety] without agoraphobia: Secondary | ICD-10-CM

## 2014-04-24 DIAGNOSIS — R9431 Abnormal electrocardiogram [ECG] [EKG]: Secondary | ICD-10-CM | POA: Insufficient documentation

## 2014-04-24 DIAGNOSIS — R0789 Other chest pain: Secondary | ICD-10-CM

## 2014-04-24 NOTE — Progress Notes (Signed)
Patient ID: Sheila [name], female   DOB: 28-Apr-1971, 43 y.o.   MRN: [id number]    HPI  Patient is seen today for cardiology consultation to followup and emergency room visit on Apr 04, 2014. The patient has had significant stress. Included in this is the fact that her daughter has overdosed. She presented to the emergency room Apr 03, 2014 with 5/10 chest pain. She does not have significant risk factors for coronary disease. She did seem to have a panic attack prior to her coming to the emergency room. Since that time she's had continued fatigue. She also has intermittent chest pain.  As part of today's evaluation I have carefully reviewed the hospital records. I have reviewed her emergency room visit and labs. I will also review the hospital EKGs.  No Known Allergies  Current Outpatient Prescriptions  Medication Sig Dispense Refill  . ferrous fumarate (HEMOCYTE - 106 MG FE) 325 (106 FE) MG TABS tablet Take 1 tablet by mouth.       No current facility-administered medications for this visit.    History   Social History  . Marital Status: Married    Spouse Name: N/A    Number of Children: N/A  . Years of Education: N/A   Occupational History  . Not on file.   Social History Main Topics  . Smoking status: Never Smoker   . Smokeless tobacco: Not on file  . Alcohol Use: No  . Drug Use: No  . Sexual Activity: Not on file   Other Topics Concern  . Not on file   Social History Narrative  . No narrative on file    Family History  Problem Relation Age of Onset  . Hypertension Mother   . Diabetes Father   . Hypertension Maternal Aunt   . Hypertension Maternal Uncle   . Cancer Maternal Grandmother   . Hypertension Maternal Grandmother     Past Medical History  Diagnosis Date  . Anemia     Past Surgical History  Procedure Laterality Date  . Tubal ligation      Patient Active Problem List   Diagnosis Date Noted  . Abnormal EKG 04/24/2014  . Chest discomfort  04/23/2014  . Panic attack 04/23/2014  . OBESITY 01/15/2009  . HAIR LOSS 01/15/2009  . ANEMIA-NOS 01/14/2009    ROS   Patient denies fever, chills, headache, sweats, rash, change in vision, change in hearing, cough, nausea vomiting, urinary symptoms. All other systems are reviewed and are negative.  PHYSICAL EXAM  Patient is oriented to person time and place. Affect is normal. She is here with her husband. Head is atraumatic. She has areas on her face of decreased pigmentation. There is no jugulovenous distention. Lungs are clear. Respiratory effort is nonlabored. There is a keloid on the anterior chest. Cardiac exam reveals an S1-S2. The abdomen is soft. There is no peripheral edema. There no musculoskeletal deformities. There are no skin rashes.  Filed Vitals:   04/24/14 1218  BP: 141/83  Pulse: 78  Height: 5\' 9"  (1.753 m)  Weight: 188 lb 6.4 oz (85.458 kg)   I have reviewed the EKGs from the emergency room visit. Have also reviewed older EKG from 2013. There is some decreased anterior R wave progression that is old. There are mild diffuse nonspecific ST-T wave changes that are old.  ASSESSMENT & PLAN

## 2014-04-24 NOTE — Patient Instructions (Addendum)
Your physician has requested that you have a Dobutamine stress echocardiogram. For further information please visit [email or web address]. Please follow instruction sheet as given.  Your physician recommends that you schedule a follow-up appointment in: 4 weeks

## 2014-04-24 NOTE — Assessment & Plan Note (Signed)
There is an abnormal EKG. This appears to be unchanged since to since 2013. 2-D echo will help [place] assess LV function.

## 2014-04-24 NOTE — Assessment & Plan Note (Signed)
The patient had chest discomfort that took her to the emergency room. She continues to have intermittent chest discomfort. Her resting EKG is abnormal. There is no diagnostic change from the past. She needs exercise testing. A standard treadmill cannot be done with this baseline EKG. We will proceed with a dobutamine stress echo. In the meantime I have encouraged the patient to remain active.

## 2014-04-24 NOTE — Assessment & Plan Note (Signed)
The patient has had significant stress recently. She does think that she had a panic attack before going to the hospital. It is important for [place] to be sure that she's not having any true cardiac symptoms.

## 2014-05-22 ENCOUNTER — Ambulatory Visit (HOSPITAL_COMMUNITY): Payer: Medicaid Other | Attending: Obstetrics | Admitting: Cardiology

## 2014-05-22 ENCOUNTER — Ambulatory Visit (HOSPITAL_BASED_OUTPATIENT_CLINIC_OR_DEPARTMENT_OTHER): Payer: Medicaid Other

## 2014-05-22 DIAGNOSIS — R0989 Other specified symptoms and signs involving the circulatory and respiratory systems: Secondary | ICD-10-CM

## 2014-05-22 DIAGNOSIS — R079 Chest pain, unspecified: Secondary | ICD-10-CM | POA: Diagnosis not present

## 2014-05-22 DIAGNOSIS — R9431 Abnormal electrocardiogram [ECG] [EKG]: Secondary | ICD-10-CM | POA: Diagnosis present

## 2014-05-22 DIAGNOSIS — R0789 Other chest pain: Secondary | ICD-10-CM

## 2014-05-22 NOTE — Progress Notes (Signed)
Stress echo performed.

## 2014-05-23 ENCOUNTER — Ambulatory Visit: Payer: Medicaid Other | Admitting: Cardiology

## 2014-05-24 ENCOUNTER — Encounter: Payer: Self-pay | Admitting: Cardiology

## 2014-05-24 DIAGNOSIS — R9430 Abnormal result of cardiovascular function study, unspecified: Secondary | ICD-10-CM | POA: Insufficient documentation

## 2014-05-30 ENCOUNTER — Ambulatory Visit (HOSPITAL_COMMUNITY): Admission: RE | Admit: 2014-05-30 | Payer: Medicaid Other | Source: Ambulatory Visit | Admitting: Cardiology

## 2014-05-30 ENCOUNTER — Ambulatory Visit (INDEPENDENT_AMBULATORY_CARE_PROVIDER_SITE_OTHER): Payer: Medicaid Other | Admitting: Cardiovascular Disease

## 2014-05-30 ENCOUNTER — Encounter (HOSPITAL_COMMUNITY)
Admission: EM | Disposition: A | Discharge status: Home / Self Care | Payer: Medicaid Other | Source: Home / Self Care | Attending: Cardiology

## 2014-05-30 ENCOUNTER — Encounter: Payer: Self-pay | Admitting: Cardiovascular Disease

## 2014-05-30 ENCOUNTER — Encounter (HOSPITAL_COMMUNITY): Payer: Self-pay | Admitting: *Deleted

## 2014-05-30 ENCOUNTER — Encounter (HOSPITAL_COMMUNITY): Payer: Self-pay | Admitting: Pharmacy Technician

## 2014-05-30 ENCOUNTER — Observation Stay (HOSPITAL_COMMUNITY)
Admission: EM | Admit: 2014-05-30 | Discharge: 2014-05-31 | Disposition: A | Discharge status: Home / Self Care | Payer: Medicaid Other | Attending: Cardiology | Admitting: Cardiology

## 2014-05-30 ENCOUNTER — Emergency Department (HOSPITAL_COMMUNITY): Payer: Medicaid Other

## 2014-05-30 VITALS — BP 130/92 | HR 76 | Ht 69.0 in | Wt 187.8 lb

## 2014-05-30 DIAGNOSIS — I249 Acute ischemic heart disease, unspecified: Secondary | ICD-10-CM

## 2014-05-30 DIAGNOSIS — R0789 Other chest pain: Secondary | ICD-10-CM

## 2014-05-30 DIAGNOSIS — D649 Anemia, unspecified: Secondary | ICD-10-CM | POA: Diagnosis not present

## 2014-05-30 DIAGNOSIS — R0989 Other specified symptoms and signs involving the circulatory and respiratory systems: Secondary | ICD-10-CM | POA: Insufficient documentation

## 2014-05-30 DIAGNOSIS — E669 Obesity, unspecified: Secondary | ICD-10-CM | POA: Diagnosis not present

## 2014-05-30 DIAGNOSIS — R079 Chest pain, unspecified: Secondary | ICD-10-CM

## 2014-05-30 DIAGNOSIS — R0609 Other forms of dyspnea: Secondary | ICD-10-CM | POA: Diagnosis not present

## 2014-05-30 DIAGNOSIS — I200 Unstable angina: Secondary | ICD-10-CM

## 2014-05-30 DIAGNOSIS — F410 Panic disorder [episodic paroxysmal anxiety] without agoraphobia: Secondary | ICD-10-CM | POA: Diagnosis present

## 2014-05-30 DIAGNOSIS — Z6827 Body mass index (BMI) 27.0-27.9, adult: Secondary | ICD-10-CM | POA: Diagnosis not present

## 2014-05-30 HISTORY — PX: LEFT HEART CATHETERIZATION WITH CORONARY ANGIOGRAM: SHX5451

## 2014-05-30 LAB — COMPREHENSIVE METABOLIC PANEL
ALK PHOS: 57 U/L (ref 39–117)
ALT: 9 U/L (ref 0–35)
ALT: 9 U/L (ref 0–35)
ANION GAP: 15 (ref 5–15)
ANION GAP: 15 (ref 5–15)
AST: 16 U/L (ref 0–37)
AST: 17 U/L (ref 0–37)
Albumin: 3.8 g/dL (ref 3.5–5.2)
Albumin: 3.5 g/dL (ref 3.5–5.2)
Alkaline Phosphatase: 59 U/L (ref 39–117)
BILIRUBIN TOTAL: 0.8 mg/dL (ref 0.3–1.2)
BUN: 9 mg/dL (ref 6–23)
BUN: 8 mg/dL (ref 6–23)
CALCIUM: 9.1 mg/dL (ref 8.4–10.5)
CHLORIDE: 101 meq/L (ref 96–112)
CO2: 23 mEq/L (ref 19–32)
CO2: 22 mEq/L (ref 19–32)
Calcium: 8.9 mg/dL (ref 8.4–10.5)
Chloride: 102 mEq/L (ref 96–112)
Creatinine, Ser: 0.84 mg/dL (ref 0.50–1.10)
Creatinine, Ser: 0.82 mg/dL (ref 0.50–1.10)
GFR calc Af Amer: >90 mL/min (ref >90)
GFR calc non Af Amer: 85 mL/min — ABNORMAL LOW (ref >90)
GFR, EST NON AFRICAN AMERICAN: 83 mL/min — AB (ref >90)
Glucose, Bld: 93 mg/dL (ref 70–99)
Glucose, Bld: 110 mg/dL — ABNORMAL HIGH (ref 70–99)
POTASSIUM: 3.9 meq/L (ref 3.7–5.3)
Potassium: 4.2 mEq/L (ref 3.7–5.3)
SODIUM: 140 meq/L (ref 137–147)
SODIUM: 138 meq/L (ref 137–147)
TOTAL PROTEIN: 7.4 g/dL (ref 6.0–8.3)
TOTAL PROTEIN: 7.1 g/dL (ref 6.0–8.3)
Total Bilirubin: 0.7 mg/dL (ref 0.3–1.2)

## 2014-05-30 LAB — CBC WITH DIFFERENTIAL/PLATELET
Basophils Absolute: 0 10*3/uL (ref 0.0–0.1)
Basophils Absolute: 0 10*3/uL (ref 0.0–0.1)
Basophils Relative: 0 % (ref 0–1)
Basophils Relative: 0 % (ref 0–1)
EOS ABS: 0.1 10*3/uL (ref 0.0–0.7)
EOS ABS: 0.1 10*3/uL (ref 0.0–0.7)
EOS PCT: 2 % (ref 0–5)
Eosinophils Relative: 2 % (ref 0–5)
HCT: 30 % — ABNORMAL LOW (ref 36.0–46.0)
HEMATOCRIT: 30.3 % — AB (ref 36.0–46.0)
HEMOGLOBIN: 9.4 g/dL — AB (ref 12.0–15.0)
Hemoglobin: 9.6 g/dL — ABNORMAL LOW (ref 12.0–15.0)
Lymphocytes Relative: 29 % (ref 12–46)
Lymphocytes Relative: 32 % (ref 12–46)
Lymphs Abs: 1.1 10*3/uL (ref 0.7–4.0)
Lymphs Abs: 1.6 10*3/uL (ref 0.7–4.0)
MCH: 22.3 pg — AB (ref 26.0–34.0)
MCH: 22.2 pg — ABNORMAL LOW (ref 26.0–34.0)
MCHC: 31.7 g/dL (ref 30.0–36.0)
MCHC: 31.3 g/dL (ref 30.0–36.0)
MCV: 70.5 fL — ABNORMAL LOW (ref 78.0–100.0)
MCV: 70.9 fL — ABNORMAL LOW (ref 78.0–100.0)
MONOS PCT: 7 % (ref 3–12)
Monocytes Absolute: 0.3 10*3/uL (ref 0.1–1.0)
Monocytes Absolute: 0.3 10*3/uL (ref 0.1–1.0)
Monocytes Relative: 9 % (ref 3–12)
NEUTROS ABS: 2.9 10*3/uL (ref 1.7–7.7)
NEUTROS PCT: 59 % (ref 43–77)
Neutro Abs: 2.3 10*3/uL (ref 1.7–7.7)
Neutrophils Relative %: 60 % (ref 43–77)
PLATELETS: 289 10*3/uL (ref 150–400)
PLATELETS: 319 10*3/uL (ref 150–400)
RBC: 4.3 MIL/uL (ref 3.87–5.11)
RBC: 4.23 MIL/uL (ref 3.87–5.11)
RDW: 20.6 % — ABNORMAL HIGH (ref 11.5–15.5)
RDW: 20.5 % — ABNORMAL HIGH (ref 11.5–15.5)
WBC: 3.9 10*3/uL — AB (ref 4.0–10.5)
WBC: 4.9 10*3/uL (ref 4.0–10.5)

## 2014-05-30 LAB — TROPONIN I

## 2014-05-30 LAB — PROTIME-INR
INR: 1.05 (ref 0.00–1.49)
INR: 1.07 (ref 0.00–1.49)
PROTHROMBIN TIME: 13.7 s (ref 11.6–15.2)
PROTHROMBIN TIME: 13.9 s (ref 11.6–15.2)

## 2014-05-30 LAB — TSH: TSH: 1.21 u[IU]/mL (ref 0.350–4.500)

## 2014-05-30 LAB — PREGNANCY, URINE: Preg Test, Ur: NEGATIVE

## 2014-05-30 SURGERY — LEFT HEART CATHETERIZATION WITH CORONARY ANGIOGRAM
Anesthesia: LOCAL

## 2014-05-30 MED ORDER — ACETAMINOPHEN 325 MG PO TABS
650.0000 mg | ORAL_TABLET | ORAL | Status: DC | PRN
  Administered 2014-05-30: 650 mg via ORAL

## 2014-05-30 MED ORDER — ONDANSETRON HCL 4 MG/2ML IJ SOLN: 4.0000 mg | Freq: Four times a day (QID) | INTRAMUSCULAR | Status: DC | PRN

## 2014-05-30 MED ORDER — ATORVASTATIN CALCIUM 40 MG PO TABS
40.0000 mg | ORAL_TABLET | Freq: Every day | ORAL | Status: DC
  Administered 2014-05-30: 21:00:00 40 mg via ORAL
  Filled 2014-05-30 (×2): qty 1

## 2014-05-30 MED ORDER — ASPIRIN EC 81 MG PO TBEC
81.0000 mg | DELAYED_RELEASE_TABLET | Freq: Every day | ORAL | Status: DC
  Administered 2014-05-31: 10:00:00 81 mg via ORAL
  Filled 2014-05-30: qty 1

## 2014-05-30 MED ORDER — SODIUM CHLORIDE 0.9 % IV SOLN: INTRAVENOUS | Status: DC

## 2014-05-30 MED ORDER — NITROGLYCERIN IN D5W 200-5 MCG/ML-% IV SOLN
3.0000 ug/min | INTRAVENOUS | Status: DC
  Administered 2014-05-30: 5 ug/min via INTRAVENOUS
  Filled 2014-05-30: qty 250

## 2014-05-30 MED ORDER — METOPROLOL TARTRATE 12.5 MG HALF TABLET
12.5000 mg | ORAL_TABLET | Freq: Two times a day (BID) | ORAL | Status: DC
  Administered 2014-05-30 – 2014-05-31 (×2): 12.5 mg via ORAL
  Filled 2014-05-30 (×3): qty 1

## 2014-05-30 MED ORDER — SODIUM CHLORIDE 0.9 % IJ SOLN
3.0000 mL | Freq: Two times a day (BID) | INTRAMUSCULAR | Status: DC
Start: 2014-05-30 — End: 2014-05-30

## 2014-05-30 MED ORDER — SODIUM CHLORIDE 0.9 % IJ SOLN: 3.0000 mL | INTRAMUSCULAR | Status: DC | PRN

## 2014-05-30 MED ORDER — NITROGLYCERIN 0.4 MG SL SUBL: 0.4000 mg | SUBLINGUAL_TABLET | SUBLINGUAL | Status: DC | PRN

## 2014-05-30 MED ORDER — SODIUM CHLORIDE 0.9 % IV SOLN: 250.0000 mL | INTRAVENOUS | Status: DC | PRN

## 2014-05-30 MED ORDER — ACETAMINOPHEN 325 MG PO TABS
650.0000 mg | ORAL_TABLET | ORAL | Status: DC | PRN
  Filled 2014-05-30: qty 2

## 2014-05-30 MED ORDER — HEPARIN BOLUS VIA INFUSION
4000.0000 [IU] | Freq: Once | INTRAVENOUS | Status: AC
  Administered 2014-05-30: 4000 [IU] via INTRAVENOUS
  Filled 2014-05-30: qty 4000

## 2014-05-30 MED ORDER — ASPIRIN 81 MG PO CHEW: 81.0000 mg | CHEWABLE_TABLET | ORAL | Status: DC

## 2014-05-30 MED ORDER — NITROGLYCERIN 0.4 MG SL SUBL
0.4000 mg | SUBLINGUAL_TABLET | Freq: Once | SUBLINGUAL | Status: AC
  Administered 2014-05-30: 0.4 mg via SUBLINGUAL

## 2014-05-30 MED ORDER — SODIUM CHLORIDE 0.9 % IV SOLN
1.0000 mL/kg/h | INTRAVENOUS | Status: AC
  Administered 2014-05-30: 1 mL/kg/h via INTRAVENOUS

## 2014-05-30 MED ORDER — HEPARIN (PORCINE) IN NACL 100-0.45 UNIT/ML-% IJ SOLN
1150.0000 [IU]/h | INTRAMUSCULAR | Status: DC
  Administered 2014-05-30: 1150 [IU]/h via INTRAVENOUS
  Filled 2014-05-30: qty 250

## 2014-05-30 NOTE — CV Procedure (Signed)
    Cardiac Catheterization Procedure Note  Name: UNIQUE SILLAS MRN: [id number] DOB: 11-04-1971  Procedure: Left Heart Cath, Selective Coronary Angiography, LV angiography  Indication: 43 yo BF present with symptoms of chest pain at rest. Recent Dobutamine Echo was abnormal with inferior ischemia.    Procedural Details: The right wrist was prepped, draped, and anesthetized with 1% lidocaine. Using the modified Seldinger technique, a 6 French slender sheath was introduced into the right radial artery. 3 mg of verapamil was administered through the sheath, weight-based unfractionated heparin was administered intravenously. Standard Judkins catheters were used for selective coronary angiography and left ventriculography. Catheter exchanges were performed over an exchange length guidewire. There were no immediate procedural complications. A TR band was used for radial hemostasis at the completion of the procedure.  The patient was transferred to the post catheterization recovery area for further monitoring.  Procedural Findings: Hemodynamics: AO 127/84 mean 105 mm Hg LV 128/7 mm Hg  Coronary angiography: Coronary dominance: right  Left mainstem: Normal  Left anterior descending (LAD): Normal  Left circumflex (LCx): Normal  Right coronary artery (RCA): Normal  Left ventriculography: Left ventricular systolic function is normal, LVEF is estimated at 55-65%, there is no significant mitral regurgitation   Final Conclusions:   1. Normal coronary anatomy 2. Normal LV function.  Recommendations: Medical management.  Yeslin Delio [place], [place]  05/30/2014, 6:15 PM

## 2014-05-30 NOTE — Patient Instructions (Addendum)
Patient transported to [facility] ER by EMS for evaluation of chest pain

## 2014-05-30 NOTE — Assessment & Plan Note (Signed)
She's been having intermittent chest pain for several weeks. 2 days ago the chest pain and left arm pain became constant. Her EKG shows T-wave inversions in the inferior leads that are slightly worse than her previous EKG in the emergency room.  She has a positive dobutamine echo that revealed inferior ischemia.    We gave her a nitroglycerin here in the office and her left arm pain was relieved.  We will go ahead and send her over to the hospital today. We have arranged for her to have a cardiac catheterization this afternoon. We have discussed the risks, benefits, and options concerning heart catheterization. She understands and agrees to proceed.

## 2014-05-30 NOTE — ED Notes (Addendum)
Per EMS- pt began having left arm and chest  tightness intermittently for 2 weeks and constant yesterday. Pt was seen at Dr. Gala Romney office for an eval this morning and was sent here for EKG changes. Pt received 1 nitro and 324mg  of asprin and is pain free at this time.

## 2014-05-30 NOTE — ED Notes (Signed)
Gave report to cath lab nurses and provided pt's paperwork with her.

## 2014-05-30 NOTE — H&P (Signed)
HPI  Patient is seen  As a  followup and emergency room visit on Apr 04, 2014. The patient has had significant stress. Included in this is the fact that her daughter has overdosed. She presented to the emergency room Apr 03, 2014 with 5/10 chest pain. She does not have significant risk factors for coronary disease. She did seem to have a panic attack prior to her coming to the emergency room. Since that time she's had continued fatigue. She also has intermittent chest pain.   As part of today's evaluation I have carefully reviewed the hospital records. I have reviewed her emergency room visit and labs. I will also review the hospital EKGs.   She was seen by Dr. Ron Parker in early July. She had a dobutamine echo which was abnormal and revealed an inferior wall motion abnormality. She also had EKG changes with a stress test. She was initially told to return earlier this week but missed that appointment. She returns today.   She has been having CP with radiation down her left arm.  The pain was intermittant but has been constant for the past 2 days.   The pan has kept her from sleeping for a day or so  Associated with dyspnea.   They occur at various times - rest and exertion.     She is a nonsmoker. Does not do any drugs.   Family hx-  hyperlipidemia.  No known CAD according to her mother.     No Known Allergies    Current Outpatient Prescriptions   Medication  Sig  Dispense  Refill   .  ferrous fumarate (HEMOCYTE - 106 MG FE) 325 (106 FE) MG TABS tablet  Take 1 tablet by mouth.             No current facility-administered medications for this visit.         History       Social History   .  Marital Status:  Married       Spouse Name:  N/A       Number of Children:  N/A   .  Years of Education:  N/A       Occupational History   .  Not on file.       Social History Main Topics   .  Smoking status:  Never Smoker    .  Smokeless tobacco:  Not on file   .  Alcohol Use:   No   .  Drug Use:  No   .  Sexual Activity:  Not on file       Other Topics  Concern   .  Not on file       Social History Narrative   .  No narrative on file         Family History   Problem  Relation  Age of Onset   .  Hypertension  Mother     .  Diabetes  Father     .  Hypertension  Maternal Aunt     .  Hypertension  Maternal Uncle     .  Cancer  Maternal Grandmother     .  Hypertension  Maternal Grandmother         Past Medical History   Diagnosis  Date   .  Anemia     .  Ejection fraction         Past Surgical History   Procedure  Laterality  Date   .  Tubal ligation             Patient Active Problem List     Diagnosis  Date Noted   .  Ejection fraction     .  Abnormal EKG  04/24/2014   .  Chest discomfort  04/23/2014   .  Panic attack  04/23/2014   .  OBESITY  01/15/2009   .  HAIR LOSS  01/15/2009   .  ANEMIA-NOS  01/14/2009     ROS    Patient denies fever, chills, headache, sweats, rash, change in vision, change in hearing, cough, nausea vomiting, urinary symptoms. All other systems are reviewed and are negative.   PHYSICAL EXAM  Patient is oriented to person time and place. Affect is normal. She is here with her husband. Head is atraumatic. She has areas on her face of decreased pigmentation. There is no jugulovenous distention. Lungs are clear. Respiratory effort is nonlabored. There is a keloid on the anterior chest. Cardiac exam reveals an S1-S2. The abdomen is soft. There is no peripheral edema. There no musculoskeletal deformities. There are no skin rashes.    Filed Vitals:     05/30/14 0844   BP:  130/84   Pulse:  76   Height:  5\' 9"  (1.753 m)   Weight:  187 lb 12.8 oz (85.186 kg)     EKG: 05/30/2014: Normal sinus   rhythm 96. She is T wave inversions in the inferior leads.   the T wave inversion has progressed slightly since her previous EKG in the emergency room.    ASSESSMENT & PLAN      Chest discomfort    She's been having  intermittent chest pain for several weeks. 2 days ago the chest pain and left arm pain became constant. Her EKG shows T-wave inversions in the inferior leads that are slightly worse than her previous EKG in the emergency room.  She has a positive dobutamine echo that revealed inferior ischemia.    We gave her a nitroglycerin here in the office and her left arm pain was relieved.  We will go ahead and send her over to the hospital today. We have arranged for her to have a cardiac catheterization this afternoon. We have discussed the risks, benefits, and options concerning heart catheterization. She understands and agrees to proceed.     Thayer Headings, Brooke Bonito., MD, [facility] 05/30/2014, 9:59 AM [address]  [place] Pager [phone]

## 2014-05-30 NOTE — Interval H&P Note (Signed)
History and Physical Interval Note:  05/30/2014 5:39 PM  Sheila [name]  has presented today for surgery, with the diagnosis of NSTEMI  The various methods of treatment have been discussed with the patient and family. After consideration of risks, benefits and other options for treatment, the patient has consented to  Procedure(s): LEFT HEART CATHETERIZATION WITH CORONARY ANGIOGRAM (N/A) as a surgical intervention .  The patient's history has been reviewed, patient examined, no change in status, stable for surgery.  I have reviewed the patient's chart and labs.  Questions were answered to the patient's satisfaction.   Cath Lab Visit (complete for each Cath Lab visit)  Clinical Evaluation Leading to the Procedure:   ACS: Yes.    Non-ACS:    Anginal Classification: CCS IV  Anti-ischemic medical therapy: No Therapy  Non-Invasive Test Results: Intermediate-risk stress test findings: cardiac mortality 1-3%/year  Prior CABG: No previous CABG        Collier Salina [facility] 05/30/2014 5:39 PM

## 2014-05-30 NOTE — ED Provider Notes (Signed)
CSN: [id number]     Arrival date & time 05/30/14  3007 History   First MD Initiated Contact with Patient 05/30/14 1007     Chief Complaint  Patient presents with  . Abnormal ECG     (Consider location/radiation/quality/duration/timing/severity/associated sxs/prior Treatment) HPI Comments: Patient from cardiology office with 2 day history of left-sided chest pain it radiates to left arm associated with EKG changes. No previous cardiac history. She's never had a stress test. The pain is on the left side of the chest and radiates down her left arm.  nothing makes it better or worse. She denies any fever, chills, nausea vomiting. She endorses some shortness of breath. She was found to have T wave inversions inferior laterally and sent over for catheterization. She is pain-free currently.  The history is provided by the patient.    Past Medical History  Diagnosis Date  . Anemia   . Ejection fraction    Past Surgical History  Procedure Laterality Date  . Tubal ligation     Family History  Problem Relation Age of Onset  . Hypertension Mother   . Diabetes Father   . Hypertension Maternal Aunt   . Hypertension Maternal Uncle   . Cancer Maternal Grandmother   . Hypertension Maternal Grandmother    History  Substance Use Topics  . Smoking status: Never Smoker   . Smokeless tobacco: Not on file  . Alcohol Use: No   OB History   Grav Para Term Preterm Abortions TAB SAB Ect Mult Living   8 6 4 2 2  2   4      Review of Systems  Constitutional: Negative for fever, activity change and appetite change.  Respiratory: Positive for chest tightness and shortness of breath.   Cardiovascular: Positive for chest pain.  Gastrointestinal: Negative for nausea, vomiting and abdominal pain.  Genitourinary: Negative for dysuria and hematuria.  Musculoskeletal: Negative for arthralgias and myalgias.  Skin: Negative for rash.  Neurological: Negative for dizziness, weakness and headaches.  A  complete 10 system review of systems was obtained and all systems are negative except as noted in the HPI and PMH.      Allergies  Review of patient's allergies indicates no known allergies.  Home Medications   Prior to Admission medications   Medication Sig Start Date End Date Taking? Authorizing Provider  ferrous fumarate (HEMOCYTE - 106 MG FE) 325 (106 FE) MG TABS tablet Take 1 tablet by mouth.   Yes Historical Provider, MD  OVER THE COUNTER MEDICATION Take 1 tablet by mouth daily. Pure [employer] Booster   Yes Historical Provider, MD   BP 128/78  Pulse 86  Resp 18  Ht 5\' 9"  (1.753 m)  Wt 188 lb (85.276 kg)  BMI 27.75 kg/m2  SpO2 100%  LMP 05/16/2014 Physical Exam  Nursing note and vitals reviewed. Constitutional: She is oriented to person, place, and time. She appears well-developed and well-nourished. No distress.  HENT:  Head: Normocephalic and atraumatic.  Mouth/Throat: Oropharynx is clear and moist. No oropharyngeal exudate.  Eyes: Conjunctivae and EOM are normal. Pupils are equal, round, and reactive to light.  Neck: Normal range of motion. Neck supple.  No meningismus.  Cardiovascular: Normal rate, regular rhythm, normal heart sounds and intact distal pulses.   No murmur heard. Pulmonary/Chest: Effort normal and breath sounds normal. No respiratory distress.  Abdominal: Soft. There is no tenderness. There is no rebound and no guarding.  Musculoskeletal: Normal range of motion. She exhibits no edema and  no tenderness.  Neurological: She is alert and oriented to person, place, and time. No cranial nerve deficit. She exhibits normal muscle tone. Coordination normal.  No ataxia on finger to nose bilaterally. No pronator drift. 5/5 strength throughout. CN 2-12 intact. Negative Romberg. Equal grip strength. Sensation intact. Gait is normal.   Skin: Skin is warm.  Psychiatric: She has a normal mood and affect. Her behavior is normal.    ED Course  Procedures (including  critical care time) Labs Review Labs Reviewed  CBC WITH DIFFERENTIAL - Abnormal; Notable for the following:    WBC 3.9 (*)    Hemoglobin 9.6 (*)    HCT 30.3 (*)    MCV 70.5 (*)    MCH 22.3 (*)    RDW 20.6 (*)    All other components within normal limits  COMPREHENSIVE METABOLIC PANEL - Abnormal; Notable for the following:    GFR calc non Af Amer 83 (*)    All other components within normal limits  TROPONIN I  PROTIME-INR    Imaging Review Dg Chest 2 View  05/30/2014   CLINICAL DATA:  Abnormal ECG with left arm discomfort  EXAM: CHEST  2 VIEW  COMPARISON:  PA and lateral chest of Apr 03, 2014  FINDINGS: The lungs are well-expanded and clear. The heart and mediastinal structures are normal. There is no pleural effusion. The bony thorax is unremarkable.  IMPRESSION: There is no active cardiopulmonary disease.   Electronically Signed   By: David  Martinique   On: 05/30/2014 11:37     EKG Interpretation None      MDM   Final diagnoses:  Chest discomfort  Acute coronary syndrome  Chest pain, unspecified chest pain type   From cardiology office with left-sided chest pain for the past several days. Positive stress test for July 8. Patient received aspirin and nitroglycerin prior to arrival. Patient denies any chest pain on arrival. EKG shows unchanged T wave inversions inferolaterally that isn't new from previous visits. Chest x-ray is negative. Troponin negative.  Patient has been seen by cardiology and will be started on heparin drip. She'll be admitted for catheterization later today.    Date: 05/30/2014  Rate: 96  Rhythm: normal sinus rhythm  QRS Axis: normal  Intervals: normal  ST/T Wave abnormalities: nonspecific ST/T changes  Conduction Disutrbances:none  Narrative Interpretation: T wave inversions inferior laterally  Old EKG Reviewed: changes noted  CRITICAL CARE Performed by: Ezequiel Essex Total critical care time: 30 Critical care time was exclusive of  separately billable procedures and treating other patients. Critical care was necessary to treat or prevent imminent or life-threatening deterioration. Critical care was time spent personally by me on the following activities: development of treatment plan with patient and/or surrogate as well as nursing, discussions with consultants, evaluation of patient's response to treatment, examination of patient, obtaining history from patient or surrogate, ordering and performing treatments and interventions, ordering and review of laboratory studies, ordering and review of radiographic studies, pulse oximetry and re-evaluation of patient's condition.   Ezequiel Essex, MD 05/30/14 [phone]

## 2014-05-30 NOTE — Progress Notes (Signed)
Patient ID: Sheila [name], female   DOB: 1971-05-09, 43 y.o.   MRN: [id number]    HPI  Patient is seen  As a  followup and emergency room visit on Apr 04, 2014. The patient has had significant stress. Included in this is the fact that her daughter has overdosed. She presented to the emergency room Apr 03, 2014 with 5/10 chest pain. She does not have significant risk factors for coronary disease. She did seem to have a panic attack prior to her coming to the emergency room. Since that time she's had continued fatigue. She also has intermittent chest pain.  As part of today's evaluation I have carefully reviewed the hospital records. I have reviewed her emergency room visit and labs. I will also review the hospital EKGs.  She was seen by Dr. Ron Parker in early July. She had a dobutamine echo which was abnormal and revealed an inferior wall motion abnormality. She also had EKG changes with a stress test. She was initially told to return earlier this week but missed that appointment. She returns today.  She has been having CP with radiation down her left arm.  The pain was intermittant but has been constant for the past 2 days.   The pan has kept her from sleeping for a day or so  Associated with dyspnea.   They occur at various times - rest and exertion.    She is a nonsmoker. Does not do any drugs.  Family hx-  hyperlipidemia.  No known CAD according to her mother.    No Known Allergies  Current Outpatient Prescriptions  Medication Sig Dispense Refill  . ferrous fumarate (HEMOCYTE - 106 MG FE) 325 (106 FE) MG TABS tablet Take 1 tablet by mouth.       No current facility-administered medications for this visit.    History   Social History  . Marital Status: Married    Spouse Name: N/A    Number of Children: N/A  . Years of Education: N/A   Occupational History  . Not on file.   Social History Main Topics  . Smoking status: Never Smoker   . Smokeless tobacco: Not on file  . Alcohol  Use: No  . Drug Use: No  . Sexual Activity: Not on file   Other Topics Concern  . Not on file   Social History Narrative  . No narrative on file    Family History  Problem Relation Age of Onset  . Hypertension Mother   . Diabetes Father   . Hypertension Maternal Aunt   . Hypertension Maternal Uncle   . Cancer Maternal Grandmother   . Hypertension Maternal Grandmother     Past Medical History  Diagnosis Date  . Anemia   . Ejection fraction     Past Surgical History  Procedure Laterality Date  . Tubal ligation      Patient Active Problem List   Diagnosis Date Noted  . Ejection fraction   . Abnormal EKG 04/24/2014  . Chest discomfort 04/23/2014  . Panic attack 04/23/2014  . OBESITY 01/15/2009  . HAIR LOSS 01/15/2009  . ANEMIA-NOS 01/14/2009    ROS   Patient denies fever, chills, headache, sweats, rash, change in vision, change in hearing, cough, nausea vomiting, urinary symptoms. All other systems are reviewed and are negative.  PHYSICAL EXAM  Patient is oriented to person time and place. Affect is normal. She is here with her husband. Head is atraumatic. She has areas on her face of  decreased pigmentation. There is no jugulovenous distention. Lungs are clear. Respiratory effort is nonlabored. There is a keloid on the anterior chest. Cardiac exam reveals an S1-S2. The abdomen is soft. There is no peripheral edema. There no musculoskeletal deformities. There are no skin rashes.  Filed Vitals:   05/30/14 0844  BP: 130/84  Pulse: 76  Height: 5\' 9"  (1.753 m)  Weight: 187 lb 12.8 oz (85.186 kg)    EKG: 05/30/2014: Normal sinus   rhythm 96. She is T wave inversions in the inferior leads.   the T wave inversion has progressed slightly since her previous EKG in the emergency room.    ASSESSMENT & PLAN

## 2014-05-30 NOTE — Progress Notes (Signed)
ANTICOAGULATION CONSULT NOTE - Initial Consult  Pharmacy Consult for Heparin Indication: chest pain/ACS  No Known Allergies  Patient Measurements: Height: 5\' 9"  (175.3 cm) Weight: 188 lb (85.276 kg) IBW/kg (Calculated) : 66.2 Heparin Dosing Weight: 85 kg  Vital Signs: BP: 150/92 mmHg (07/16 1305) Pulse Rate: 78 (07/16 1305)  Labs:  Recent Labs  05/30/14 1150  HGB 9.6*  HCT 30.3*  PLT 289  LABPROT 13.7  INR 1.05  CREATININE 0.84  TROPONINI <0.30    Estimated Creatinine Clearance: 98.5 ml/min (by C-G formula based on Cr of 0.84).   Medical History: Past Medical History  Diagnosis Date  . Anemia   . Ejection fraction     Medications:  See electronic med rec  Assessment: 43 y.o. female presents from o/p cardiology office to ED with CP. Pt with positive dobutamine ECHO that revealed inferior ischemia. EKG shows slightly worse T-wave inversions. Plan to start heparin and then cath later this afternoon. Hgb a little low on admit but appears about baseline for her.  Goal of Therapy:  Heparin level 0.3-0.7 units/ml Monitor platelets by anticoagulation protocol: Yes   Plan:  1. Heparin IV bolus 4000 units 2. Heparin IV gtt at 1150 units/hr 3. Will f/u post cath  Sherlon Handing, PharmD, BCPS Clinical pharmacist, pager [phone] 05/30/2014,1:29 PM

## 2014-05-31 DIAGNOSIS — D649 Anemia, unspecified: Secondary | ICD-10-CM | POA: Diagnosis not present

## 2014-05-31 DIAGNOSIS — F410 Panic disorder [episodic paroxysmal anxiety] without agoraphobia: Secondary | ICD-10-CM | POA: Diagnosis not present

## 2014-05-31 DIAGNOSIS — E669 Obesity, unspecified: Secondary | ICD-10-CM | POA: Diagnosis not present

## 2014-05-31 DIAGNOSIS — R079 Chest pain, unspecified: Secondary | ICD-10-CM | POA: Diagnosis not present

## 2014-05-31 LAB — LIPID PANEL
CHOL/HDL RATIO: 3.4 ratio
Cholesterol: 155 mg/dL (ref 0–200)
HDL: 45 mg/dL (ref >39)
LDL CALC: 97 mg/dL (ref 0–99)
Triglycerides: 66 mg/dL (ref <150)
VLDL: 13 mg/dL (ref 0–40)

## 2014-05-31 MED ORDER — METOPROLOL TARTRATE 12.5 MG HALF TABLET: 12.5000 mg | ORAL_TABLET | Freq: Two times a day (BID) | ORAL | Status: DC

## 2014-05-31 MED FILL — Lidocaine HCl Local Preservative Free (PF) Inj 1%: INTRAMUSCULAR | Qty: 30 | Status: AC

## 2014-05-31 MED FILL — Midazolam HCl Inj 2 MG/2ML (Base Equivalent): INTRAMUSCULAR | Qty: 2 | Status: AC

## 2014-05-31 MED FILL — Heparin Sodium (Porcine) Inj 1000 Unit/ML: INTRAMUSCULAR | Qty: 10 | Status: AC

## 2014-05-31 MED FILL — Verapamil HCl IV Soln 2.5 MG/ML: INTRAVENOUS | Qty: 2 | Status: AC

## 2014-05-31 MED FILL — Fentanyl Citrate Inj 0.05 MG/ML: INTRAMUSCULAR | Qty: 2 | Status: AC

## 2014-05-31 MED FILL — Nitroglycerin IV Soln 200 MCG/ML in D5W: INTRAVENOUS | Qty: 1 | Status: AC

## 2014-05-31 MED FILL — Heparin Sodium (Porcine) 2 Unit/ML in Sodium Chloride 0.9%: INTRAMUSCULAR | Qty: 500 | Status: AC

## 2014-05-31 NOTE — Discharge Summary (Signed)
Discharge Summary   Patient ID: Sheila [name],  MRN: [id number], DOB/AGE: [birth year] 43 y.o.  Admit date: 05/30/2014 Discharge date: 05/31/2014  Primary Care Provider: MARSHALL,BERNARD A Primary Cardiologist: Dr. Ron Parker Discharge Diagnoses Principal Problem:   Chest pain at rest Active Problems:   OBESITY   ANEMIA-NOS   Panic attack   Allergies No Known Allergies  Procedures  Procedure: Left Heart Cath, Selective Coronary Angiography, LV angiography   Procedural Findings:  Hemodynamics:  AO 127/84 mean 105 mm Hg  LV 128/7 mm Hg  Coronary angiography:  Coronary dominance: right  Left mainstem: Normal  Left anterior descending (LAD): Normal  Left circumflex (LCx): Normal  Right coronary artery (RCA): Normal  Left ventriculography: Left ventricular systolic function is normal, LVEF is estimated at 55-65%, there is no significant mitral regurgitation  Final Conclusions:  1. Normal coronary anatomy  2. Normal LV function.  Recommendations: Medical management.   Hospital Course  The patient is a 43 year old [place] [name] female with no past cardiac history who has been dealing with a lot of stress recently. She presented to [facility] ED on 05/30/2014 with constant chest pain radiating to her left arm for the past 2 days. It was also associated with dyspnea as well. She was recently seen by Dr. Ron Parker in early July and had a dobutamine echo was abnormal and revealed inferior wall abnormality. She had EKG changes during the stress test. Given her recently abnormal stress test, the decision was made to evaluate with cardiac catheterization.  Patient underwent cardiac cath on 05/30/2014 which showed normal LV function and normal coronary anatomy. She was seen the morning of 05/31/2014 at which time she denies any symptoms significant chest discomfort, shortness of breath or dizziness. Her right radial cath site appeared to be clean, dry, intact without significant bleeding or  hematoma. According to the patient, she has been dealing with a lot of stress recently and had several episodes of what appears to be panic attack. I will schedule post-cath wound check followup with cardiology clinic in 1 month.  Patient will be discharged once able to ambulate without significant CP or SOB  Discharge Vitals Blood pressure 102/85, pulse 77, temperature 97.9 F (36.6 C), temperature source Oral, resp. rate 18, height 5\' 9"  (1.753 m), weight 188 lb (85.276 kg), last menstrual period 05/16/2014, SpO2 99.00%.  Filed Weights   05/30/14 1305  Weight: 188 lb (85.276 kg)    Labs  CBC  Recent Labs  05/30/14 1150 05/30/14 2025  WBC 3.9* 4.9  NEUTROABS 2.3 2.9  HGB 9.6* 9.4*  HCT 30.3* 30.0*  MCV 70.5* 70.9*  PLT 289 409   Basic Metabolic Panel  Recent Labs  05/30/14 1150 05/30/14 2025  NA 140 138  K 4.2 3.9  CL 102 101  CO2 23 22  GLUCOSE 93 110*  BUN 9 8  CREATININE 0.84 0.82  CALCIUM 9.1 8.9   Liver Function Tests  Recent Labs  05/30/14 1150 05/30/14 2025  AST 16 17  ALT 9 9  ALKPHOS 59 57  BILITOT 0.7 0.8  PROT 7.4 7.1  ALBUMIN 3.8 3.5   Cardiac Enzymes  Recent Labs  05/30/14 1150  TROPONINI <0.30   Fasting Lipid Panel  Recent Labs  05/31/14 0433  CHOL 155  HDL 45  LDLCALC 97  TRIG 66  CHOLHDL 3.4   Thyroid Function Tests  Recent Labs  05/30/14 2025  TSH 1.210    Disposition  Pt is being discharged home today in good  condition.  Follow-up Plans & Appointments      Follow-up Information   Follow up with Richardson Dopp, [name] On 07/08/2014. (post-cath wound check. 10:10am)    Specialty:  Physician Assistant   Contact information:   [address] [address] [phone]       Please follow up. (Please follow up with your PCP for monitoring of anemia)       Discharge Medications    Medication List    ASK your doctor about these medications       ferrous fumarate 325 (106 FE) MG  Tabs tablet  Commonly known as:  HEMOCYTE - 106 mg FE  Take 1 tablet by mouth.     OVER THE COUNTER MEDICATION  Take 1 tablet by mouth daily. Pure [employer] Booster        Outstanding Labs/Studies  Please followup with your PCP for monitoring of anemia.  Duration of Discharge Encounter   Greater than 30 minutes including physician time.  Signed, Almyra Deforest [name] 05/31/2014, 8:18 AM    I have examined the patient and reviewed assessment and plan and discussed with patient.  Agree with above as stated.  No cardiac etiology for chest pain noted.  She is pain free.  No problems with walking reported.  OK for discharge.  Terrill Wauters S.

## 2014-05-31 NOTE — Progress Notes (Signed)
Patient Name: ROSHA COCKER Date of Encounter: 05/31/2014     Active Problems:   Chest pain at rest    SUBJECTIVE  Mild chest pain last night, resolved. No SOB  CURRENT MEDS . aspirin EC  81 mg Oral Daily  . atorvastatin  40 mg Oral q1800  . metoprolol tartrate  12.5 mg Oral BID    OBJECTIVE  Filed Vitals:   05/30/14 2107 05/30/14 2200 05/31/14 0008 05/31/14 0421  BP: 133/87 124/93 111/76 112/76  Pulse: 92 79 80 67  Temp:   98.6 F (37 C) 98.1 F (36.7 C)  TempSrc:   Oral Oral  Resp:   18 18  Height:      Weight:      SpO2:  100% 100% 99%    Intake/Output Summary (Last 24 hours) at 05/31/14 0735 Last data filed at 05/30/14 2202  Gross per 24 hour  Intake      0 ml  Output      1 ml  Net     -1 ml   Filed Weights   05/30/14 1305  Weight: 188 lb (85.276 kg)    PHYSICAL EXAM  General: Pleasant, NAD. Neuro: Alert and oriented X 3. Moves all extremities spontaneously. Psych: Normal affect. HEENT:  Normal  Neck: Supple without bruits or JVD. Lungs:  Resp regular and unlabored, CTA. Keloid on chest Heart: RRR no s3, s4, or murmurs. Abdomen: Soft, non-tender, non-distended, BS + x 4.  Extremities: No clubbing, cyanosis or edema. DP/PT/Radials 2+ and equal bilaterally.  Accessory Clinical Findings  CBC  Recent Labs  05/30/14 1150 05/30/14 2025  WBC 3.9* 4.9  NEUTROABS 2.3 2.9  HGB 9.6* 9.4*  HCT 30.3* 30.0*  MCV 70.5* 70.9*  PLT 289 154   Basic Metabolic Panel  Recent Labs  05/30/14 1150 05/30/14 2025  NA 140 138  K 4.2 3.9  CL 102 101  CO2 23 22  GLUCOSE 93 110*  BUN 9 8  CREATININE 0.84 0.82  CALCIUM 9.1 8.9   Liver Function Tests  Recent Labs  05/30/14 1150 05/30/14 2025  AST 16 17  ALT 9 9  ALKPHOS 59 57  BILITOT 0.7 0.8  PROT 7.4 7.1  ALBUMIN 3.8 3.5   Cardiac Enzymes  Recent Labs  05/30/14 1150  TROPONINI <0.30   Fasting Lipid Panel  Recent Labs  05/31/14 0433  CHOL 155  HDL 45  LDLCALC 97  TRIG 66   CHOLHDL 3.4   Thyroid Function Tests  Recent Labs  05/30/14 2025  TSH 1.210    TELE  NSR with HR 80s without significant ventricular ectopy  ECG  NSR with TWI in V3-V6 and inferior lead  Radiology/Studies  Dg Chest 2 View  05/30/2014   CLINICAL DATA:  Abnormal ECG with left arm discomfort  EXAM: CHEST  2 VIEW  COMPARISON:  PA and lateral chest of Apr 03, 2014  FINDINGS: The lungs are well-expanded and clear. The heart and mediastinal structures are normal. There is no pleural effusion. The bony thorax is unremarkable.  IMPRESSION: There is no active cardiopulmonary disease.   Electronically Signed   By: David  Martinique   On: 05/30/2014 11:37    ASSESSMENT AND PLAN  1. Chest pain with recent abnormal stress test  - TWI in inferior lead on EKG  - clean cath 05/30/2014, CP noncardiac  - plan for discharge if able to walk without significant chest discomfort.   2. Anxiety: possible panic attacks  3.  Anemia: stable at 9.4  Signed, Almyra Deforest [name]   I have examined the patient and reviewed assessment and plan and discussed with patient.  Agree with above as stated.  No cardiac etiology for chest pain noted.  She is pain free.  No problems with walking reported.  OK for discharge.  Debraann Livingstone S.

## 2014-05-31 NOTE — Progress Notes (Signed)
Utilization review completed.  

## 2014-05-31 NOTE — Progress Notes (Signed)
TR BAND REMOVAL  LOCATION:    right radial  DEFLATED PER PROTOCOL:    Yes.    TIME BAND OFF / DRESSING APPLIED:    21:00   SITE UPON ARRIVAL:    Level 0  SITE AFTER BAND REMOVAL:    Level 0  REVERSE ALLEN'S TEST:     positive  CIRCULATION SENSATION AND MOVEMENT:    Within Normal Limits   Yes.    COMMENTS:   Pt tolerated removal of TR band without complications, will continue to monitor patient,

## 2014-05-31 NOTE — Discharge Instructions (Signed)

## 2014-07-08 ENCOUNTER — Ambulatory Visit (INDEPENDENT_AMBULATORY_CARE_PROVIDER_SITE_OTHER): Payer: Medicaid Other | Admitting: Physician Assistant

## 2014-07-08 ENCOUNTER — Encounter: Payer: Self-pay | Admitting: Physician Assistant

## 2014-07-08 VITALS — BP 140/90 | HR 63 | Ht 69.0 in | Wt 189.0 lb

## 2014-07-08 DIAGNOSIS — R0789 Other chest pain: Secondary | ICD-10-CM

## 2014-07-08 DIAGNOSIS — D649 Anemia, unspecified: Secondary | ICD-10-CM

## 2014-07-08 DIAGNOSIS — R030 Elevated blood-pressure reading, without diagnosis of hypertension: Secondary | ICD-10-CM

## 2014-07-08 NOTE — Patient Instructions (Addendum)
Your physician recommends that you continue on your current medications as directed. Please refer to the Current Medication list given to you today.  Your physician recommends that you schedule a follow-up appointment as needed with Dr Ron Parker

## 2014-07-08 NOTE — Progress Notes (Signed)
Cardiology Office Note    Date:  07/08/2014   ID:  Sheila [name], DOB 1971/04/11, MRN [id number]  PCP:  Frederico Hamman, MD  Cardiologist:  Dr. Cleatis Polka      History of Present Illness: Sheila [name] is a 43 y.o. female who was evaluated by Dr. Cleatis Polka in 04/2014 after a trip to the ED with chest pain.  Dobutamine Echo was abnormal and suggestive of inferior wall ischemia.  She was seen in FU and had significant chest pain and was admitted to the hospital.  LHC demonstrated normal coronary arteries.  Patient did admit to significant emotional stress in her life and suspected she may be having panic attacks.    She returns for FU.  She is doing much better. She has occasional chest pain that she thinks is related to the large keloid on her chest.  She denies significant dyspnea.  Denies orthopnea, PND, edema.  She denies syncope or palpitations.  Denies any chest pain related to meals, dysphagia, melena, hematochezia.     Studies:  - LHC (05/2014):  EF 55-65%, normal coronary arteries  - Dobutamine Echo (05/2014):  EF 55-60%; + stress induced inf WMA c/w ischemia; + inf-lat ST depression with peak stress   Recent Labs/Images: 05/30/2014: ALT 9; Creatinine 0.82; Hemoglobin 9.4*; Potassium 3.9; TSH 1.210  05/31/2014: HDL Cholesterol by NMR 45; LDL (calc) 97   Dg Chest 2 View   05/30/2014    IMPRESSION: There is no active cardiopulmonary disease.   Electronically Signed   By: David  Martinique   On: 05/30/2014 11:37     Wt Readings from Last 3 Encounters:  07/08/14 189 lb (85.73 kg)  05/30/14 188 lb (85.276 kg)  05/30/14 187 lb 12.8 oz (85.186 kg)     Past Medical History  Diagnosis Date  . Anemia   . Ejection fraction     Current Outpatient Prescriptions  Medication Sig Dispense Refill  . ferrous fumarate (HEMOCYTE - 106 MG FE) 325 (106 FE) MG TABS tablet Take 1 tablet by mouth.      . metoprolol tartrate (LOPRESSOR) 12.5 mg TABS tablet Take 0.5 tablets (12.5 mg  total) by mouth 2 (two) times daily.  30 tablet  6  . OVER THE COUNTER MEDICATION Take 1 tablet by mouth daily. Pure [employer] Booster       No current facility-administered medications for this visit.     Allergies:   Review of patient's allergies indicates no known allergies.   Social History:  The patient  reports that she has never smoked. She does not have any smokeless tobacco history on file. She reports that she does not drink alcohol or use illicit drugs.   Family History:  The patient's family history includes Cancer in her maternal grandmother; Diabetes in her father; Hypertension in her maternal aunt, maternal grandmother, maternal uncle, and mother; Stroke in her maternal grandfather and maternal grandmother.   ROS:  Please see the history of present illness.      All other systems reviewed and negative.   PHYSICAL EXAM: VS:  BP 140/90  Pulse 63  Ht 5\' 9"  (1.753 m)  Wt 189 lb (85.73 kg)  BMI 27.90 kg/m2 Well nourished, well developed, in no acute distress HEENT: normal Neck: no JVD Cardiac:  normal S1, S2; RRR; no murmur Lungs:  clear to auscultation bilaterally, no wheezing, rhonchi or rales Abd: soft, nontender, no hepatomegaly Ext: no edemaright wrist without hematoma or mass  Skin: large  horizontal keloid noted on anterior chest Neuro:  CNs 2-12 intact, no focal abnormalities noted  EKG:  NSR, HR 63, normal axis, NSSTTW changes, short PR, no change from prior tracing.      ASSESSMENT AND PLAN:  Chest discomfort:  Overall improved.  She is likely experiencing pain from the keloid on her chest as well. She denies symptoms that would suggest a GI etiology.  LHC demonstrated normal coronary arteries.  No further cardiac workup is needed.   ANEMIA-NOS:  Continue Fe.  FU with PCP.   Borderline systolic HTN:  Continue Metoprolol Tartrate.  Continue to monitor.     Disposition:  FU with Dr. Cleatis Polka PRN.   Signed, Versie Starks, MHS 07/08/2014 10:26  AM    [facility] [place], [place], [place]  [address] Phone: [phone]; Fax: [phone]

## 2014-07-23 ENCOUNTER — Emergency Department (HOSPITAL_COMMUNITY): Payer: Medicaid Other

## 2014-07-23 ENCOUNTER — Encounter (HOSPITAL_COMMUNITY): Payer: Self-pay | Admitting: Emergency Medicine

## 2014-07-23 ENCOUNTER — Emergency Department (HOSPITAL_COMMUNITY)
Admission: EM | Admit: 2014-07-23 | Discharge: 2014-07-23 | Disposition: A | Discharge status: Home / Self Care | Payer: Medicaid Other | Attending: Emergency Medicine | Admitting: Emergency Medicine

## 2014-07-23 DIAGNOSIS — R61 Generalized hyperhidrosis: Secondary | ICD-10-CM | POA: Diagnosis not present

## 2014-07-23 DIAGNOSIS — D649 Anemia, unspecified: Secondary | ICD-10-CM | POA: Diagnosis not present

## 2014-07-23 DIAGNOSIS — R110 Nausea: Secondary | ICD-10-CM | POA: Insufficient documentation

## 2014-07-23 DIAGNOSIS — R0602 Shortness of breath: Secondary | ICD-10-CM | POA: Diagnosis not present

## 2014-07-23 DIAGNOSIS — Z79899 Other long term (current) drug therapy: Secondary | ICD-10-CM | POA: Insufficient documentation

## 2014-07-23 DIAGNOSIS — R079 Chest pain, unspecified: Secondary | ICD-10-CM | POA: Insufficient documentation

## 2014-07-23 LAB — BASIC METABOLIC PANEL
ANION GAP: 13 (ref 5–15)
BUN: 13 mg/dL (ref 6–23)
CHLORIDE: 102 meq/L (ref 96–112)
CO2: 14 mEq/L — ABNORMAL LOW (ref 19–32)
Calcium: 8.5 mg/dL (ref 8.4–10.5)
Creatinine, Ser: 0.78 mg/dL (ref 0.50–1.10)
GFR calc Af Amer: >90 mL/min (ref >90)
GFR calc non Af Amer: >90 mL/min (ref >90)
Glucose, Bld: 94 mg/dL (ref 70–99)
POTASSIUM: 5.2 meq/L (ref 3.7–5.3)
Sodium: 129 mEq/L — ABNORMAL LOW (ref 137–147)

## 2014-07-23 LAB — I-STAT TROPONIN, ED: Troponin i, poc: 0 ng/mL (ref 0.00–0.08)

## 2014-07-23 LAB — HCG, SERUM, QUALITATIVE: PREG SERUM: NEGATIVE

## 2014-07-23 LAB — CBC
HEMATOCRIT: 27.3 % — AB (ref 36.0–46.0)
Hemoglobin: 8.7 g/dL — ABNORMAL LOW (ref 12.0–15.0)
MCH: 22.7 pg — ABNORMAL LOW (ref 26.0–34.0)
MCHC: 31.9 g/dL (ref 30.0–36.0)
MCV: 71.1 fL — ABNORMAL LOW (ref 78.0–100.0)
Platelets: 313 10*3/uL (ref 150–400)
RBC: 3.84 MIL/uL — AB (ref 3.87–5.11)
RDW: 17.5 % — ABNORMAL HIGH (ref 11.5–15.5)
WBC: 4.3 10*3/uL (ref 4.0–10.5)

## 2014-07-23 LAB — TROPONIN I

## 2014-07-23 LAB — D-DIMER, QUANTITATIVE: D-Dimer, Quant: 0.59 ug/mL-FEU — ABNORMAL HIGH (ref 0.00–0.48)

## 2014-07-23 MED ORDER — MORPHINE SULFATE 4 MG/ML IJ SOLN
6.0000 mg | Freq: Once | INTRAMUSCULAR | Status: AC
  Administered 2014-07-23: 6 mg via INTRAVENOUS
  Filled 2014-07-23: qty 2

## 2014-07-23 MED ORDER — SODIUM CHLORIDE 0.9 % IV BOLUS (SEPSIS)
1000.0000 mL | Freq: Once | INTRAVENOUS | Status: AC
  Administered 2014-07-23: 1000 mL via INTRAVENOUS

## 2014-07-23 MED ORDER — FENTANYL CITRATE 0.05 MG/ML IJ SOLN
50.0000 ug | Freq: Once | INTRAMUSCULAR | Status: AC
  Administered 2014-07-23: 50 ug via INTRAVENOUS
  Filled 2014-07-23: qty 2

## 2014-07-23 MED ORDER — NITROGLYCERIN 0.4 MG SL SUBL: 0.4000 mg | SUBLINGUAL_TABLET | SUBLINGUAL | Status: DC | PRN

## 2014-07-23 MED ORDER — HYDROCODONE-ACETAMINOPHEN 5-325 MG PO TABS: 1.0000 | ORAL_TABLET | Freq: Two times a day (BID) | ORAL | Status: DC | PRN

## 2014-07-23 MED ORDER — NITROGLYCERIN 0.4 MG SL SUBL
0.4000 mg | SUBLINGUAL_TABLET | SUBLINGUAL | Status: DC | PRN
  Administered 2014-07-23 (×2): 0.4 mg via SUBLINGUAL
  Filled 2014-07-23: qty 1

## 2014-07-23 MED ORDER — IBUPROFEN 200 MG PO TABS
400.0000 mg | ORAL_TABLET | Freq: Once | ORAL | Status: AC
  Administered 2014-07-23: 400 mg via ORAL
  Filled 2014-07-23: qty 2

## 2014-07-23 MED ORDER — IOHEXOL 350 MG/ML SOLN
100.0000 mL | Freq: Once | INTRAVENOUS | Status: AC | PRN
  Administered 2014-07-23: 73 mL via INTRAVENOUS

## 2014-07-23 NOTE — ED Notes (Signed)
Patient already received dose of nitro with EMS, and had 2 asa prior to arrival.

## 2014-07-23 NOTE — Discharge Instructions (Signed)
Chest Pain (Nonspecific) Sheila [name], you were seen today for chest pain.  Your heart markers were negative twice and your CT scan did not show any blood clots.  Follow up with your regular doctor within 3 days for more evaluation of your chest pain.  If ANY of your symptoms worsen or you develop worsening chest pain, shortness of breath, dizziness, return to the ED immediately for repeat evaluation.  Thank you.  It is often hard to give a diagnosis for the cause of chest pain. There is always a chance that your pain could be related to something serious, such as a heart attack or a blood clot in the lungs. You need to follow up with your doctor. HOME CARE  If antibiotic medicine was given, take it as directed by your doctor. Finish the medicine even if you start to feel better.  For the next few days, avoid activities that bring on chest pain. Continue physical activities as told by your doctor.  Do not use any tobacco products. This includes cigarettes, chewing tobacco, and e-cigarettes.  Avoid drinking alcohol.  Only take medicine as told by your doctor.  Follow your doctor's suggestions for more testing if your chest pain does not go away.  Keep all doctor visits you made. GET HELP IF:  Your chest pain does not go away, even after treatment.  You have a rash with blisters on your chest.  You have a fever. GET HELP RIGHT AWAY IF:   You have more pain or pain that spreads to your arm, neck, jaw, back, or belly (abdomen).  You have shortness of breath.  You cough more than usual or cough up blood.  You have very bad back or belly pain.  You feel sick to your stomach (nauseous) or throw up (vomit).  You have very bad weakness.  You pass out (faint).  You have chills. This is an emergency. Do not wait to see if the problems will go away. Call your local emergency services (911 in [place] Do not drive yourself to the hospital. MAKE SURE YOU:   Understand these  instructions.  Will watch your condition.  Will get help right away if you are not doing well or get worse. Document Released: 04/19/2008 Document Revised: 11/06/2013 Document Reviewed: 04/19/2008 [facility] Patient Information 2015 [place], [place]. This information is not intended to replace advice given to you by your health care provider. Make sure you discuss any questions you have with your health care provider.

## 2014-07-23 NOTE — ED Notes (Signed)
CT called to inform IV placed for testingl.

## 2014-07-23 NOTE — ED Provider Notes (Signed)
CSN: [id number]     Arrival date & time 07/23/14  0156 History   First MD Initiated Contact with Patient 07/23/14 [phone]     Chief Complaint  Patient presents with  . Chest Pain     (Consider location/radiation/quality/duration/timing/severity/associated sxs/prior Treatment) HPI  Sheila [name] is a 43 y.o. female with a past medical history of anemia coming in with right-sided chest pain. Patient states it woke her up out of sleep around midnight. It was associated with shortness of breath diaphoresis and nausea. She denies any emesis. The pain is pleuritic and worse with a deep breath. She states it radiates straight through to her back. As described as a cramp or ache sensation. Of note patient was recently admitted to this hospital for left-sided chest pain. She had an abnormal stress test and a normal cardiac cath workup. Patient was given 3 nitroglycerin tablets which did help relieve her chest pain today. However currently she states it is coming back again. She denies long distance travel, recent surgeries, history of blood clots, or history of malignancy. She admits to subjective fevers. No recent infections, no abdominal pain changes in her urine or bowel.  10 Systems reviewed and are negative for acute change except as noted in the HPI.    Past Medical History  Diagnosis Date  . Anemia   . Ejection fraction    Past Surgical History  Procedure Laterality Date  . Tubal ligation     Family History  Problem Relation Age of Onset  . Hypertension Mother   . Diabetes Father   . Hypertension Maternal Aunt   . Hypertension Maternal Uncle   . Cancer Maternal Grandmother   . Hypertension Maternal Grandmother   . Stroke Maternal Grandmother   . Stroke Maternal Grandfather    History  Substance Use Topics  . Smoking status: Never Smoker   . Smokeless tobacco: Not on file  . Alcohol Use: No   OB History   Grav Para Term Preterm Abortions TAB SAB Ect Mult Living   8 6 4 2 2  2    4      Review of Systems    Allergies  Review of patient's allergies indicates no known allergies.  Home Medications   Prior to Admission medications   Medication Sig Start Date End Date Taking? Authorizing Provider  albuterol (PROVENTIL HFA;VENTOLIN HFA) 108 (90 BASE) MCG/ACT inhaler Inhale 2 puffs into the lungs every 6 (six) hours as needed for wheezing or shortness of breath.   Yes Historical Provider, MD  ferrous fumarate (HEMOCYTE - 106 MG FE) 325 (106 FE) MG TABS tablet Take 1 tablet by mouth.   Yes Historical Provider, MD  metoprolol tartrate (LOPRESSOR) 12.5 mg TABS tablet Take 0.5 tablets (12.5 mg total) by mouth 2 (two) times daily. 05/31/14  Yes Almyra Deforest, PA   BP 135/100  Pulse 72  Temp(Src) 98.2 F (36.8 C) (Oral)  Resp 16  Ht 5\' 9"  (1.753 m)  Wt 189 lb (85.73 kg)  BMI 27.90 kg/m2  SpO2 100%  LMP 07/08/2014 Physical Exam  Nursing note and vitals reviewed. Constitutional: She is oriented to person, place, and time. She appears well-developed and well-nourished. No distress.  HENT:  Head: Normocephalic and atraumatic.  Nose: Nose normal.  Mouth/Throat: Oropharynx is clear and moist. No oropharyngeal exudate.  Eyes: Conjunctivae and EOM are normal. Pupils are equal, round, and reactive to light. No scleral icterus.  Neck: Normal range of motion. Neck supple. No JVD present. No  tracheal deviation present. No thyromegaly present.  Cardiovascular: Normal rate, regular rhythm and normal heart sounds.  Exam reveals no gallop and no friction rub.   No murmur heard. Pulmonary/Chest: Effort normal and breath sounds normal. No respiratory distress. She has no wheezes. She exhibits no tenderness.  Abdominal: Soft. Bowel sounds are normal. She exhibits no distension and no mass. There is no tenderness. There is no rebound and no guarding.  Musculoskeletal: Normal range of motion. She exhibits no edema and no tenderness.  Lymphadenopathy:    She has no cervical adenopathy.   Neurological: She is alert and oriented to person, place, and time. No cranial nerve deficit. She exhibits normal muscle tone.  Skin: Skin is warm and dry. No rash noted. She is not diaphoretic. No erythema. No pallor.    ED Course  Procedures (including critical care time) Labs Review Labs Reviewed  CBC - Abnormal; Notable for the following:    RBC 3.84 (*)    Hemoglobin 8.7 (*)    HCT 27.3 (*)    MCV 71.1 (*)    MCH 22.7 (*)    RDW 17.5 (*)    All other components within normal limits  BASIC METABOLIC PANEL - Abnormal; Notable for the following:    Sodium 129 (*)    CO2 14 (*)    All other components within normal limits  D-DIMER, QUANTITATIVE - Abnormal; Notable for the following:    D-Dimer, Quant 0.59 (*)    All other components within normal limits  TROPONIN I  HCG, SERUM, QUALITATIVE  I-STAT TROPOININ, ED    Imaging Review Ct Angio Chest Pe W/cm &/or Wo Cm  07/23/2014   CLINICAL DATA:  Right-sided chest pain  EXAM: CT ANGIOGRAPHY CHEST WITH CONTRAST  TECHNIQUE: Multidetector CT imaging of the chest was performed using the standard protocol during bolus administration of intravenous contrast. Multiplanar CT image reconstructions and MIPs were obtained to evaluate the vascular anatomy.  CONTRAST:  35mL OMNIPAQUE IOHEXOL 350 MG/ML SOLN  COMPARISON:  None.  FINDINGS: Lungs are well aerated bilaterally without focal infiltrate or sizable effusion. No parenchymal nodule is noted.  The thoracic aorta is well visualized and shows the findings of the aneurysmal dilatation or dissection. Pulmonary artery is well visualized and demonstrates a normal branching pattern. No filling defects are identified just pulmonary emboli. No hilar or mediastinal adenopathy is seen. No significant coronary artery calcifications all are noted. No bony abnormality is noted.  Review of the MIP images confirms the above findings.  IMPRESSION: No evidence of pulmonary emboli.  No acute abnormality is seen.    Electronically Signed   By: Inez Catalina M.D.   On: 07/23/2014 07:19   Dg Chest Port 1 View  07/23/2014   CLINICAL DATA:  Right chest pain  EXAM: PORTABLE CHEST - 1 VIEW  COMPARISON:  05/30/2014  FINDINGS: Lungs are clear.  No pleural effusion or pneumothorax.  The heart is normal size.  IMPRESSION: No evidence of acute cardiopulmonary disease.   Electronically Signed   By: Julian Hy M.D.   On: 07/23/2014 02:45     EKG Interpretation   Date/Time:  Tuesday July 23 2014 02:04:19 EDT Ventricular Rate:  78 PR Interval:    QRS Duration: 74 QT Interval:  375 QTC Calculation: 427 R Axis:   32 Text Interpretation:  Normal sinus rhythm No significant change since last  tracing Confirmed by Glynn Octave [phone]) on 07/23/2014 6:05:58 AM      MDM  Final diagnoses:  None    Patient is to emergency department out of concern for chest pain. She states that the pain is significant and she has never had anything like this before. She states this is different from the pain in which she got the cardiac cath.  D-dimer was positive, CT scan has been ordered. Patient was given morphine for pain management.  Upon my repeat assessment patient states her pain is significantly improved. Troponins are now negative x2. CT scan did not reveal any pulmonary embolism.  This was advised to follow up with her primary care physician within 3 days for continued evaluation of her chest pain.  She will be discharged with pain medication. Her vital signs remain stable at her normal baseline. Patient safe for discharge.  Everlene Balls, MD 07/23/14 [phone]

## 2014-07-23 NOTE — ED Notes (Signed)
Called main lab in order to add on new lab orders.  Red top needed for HCG. All others are added on.

## 2014-07-23 NOTE — ED Notes (Signed)
Patient ambulated to the restroom

## 2014-07-23 NOTE — ED Notes (Signed)
Per EMS, patient reports having chest pain that started this morning that woke her up from her sleep.  Right sided chest and back pain "cramping". ASA taken prior to EMS arrival.  EKG en route showed normal sinus. 1  Nitro given with no relief.  History of back spasms. Cardiac cath approx 2 months ago that showed no blockage.  No diaphoresis, painful when taking a deep breath.  BP 158/96, P 70, O2 sat 100% on room air.

## 2014-09-16 ENCOUNTER — Encounter (HOSPITAL_COMMUNITY): Payer: Self-pay | Admitting: Emergency Medicine

## 2014-10-24 ENCOUNTER — Encounter (HOSPITAL_COMMUNITY): Payer: Self-pay | Admitting: Cardiology

## 2014-10-28 ENCOUNTER — Other Ambulatory Visit: Payer: Self-pay

## 2014-10-28 DIAGNOSIS — Z1231 Encounter for screening mammogram for malignant neoplasm of breast: Secondary | ICD-10-CM

## 2014-11-07 ENCOUNTER — Ambulatory Visit
Admission: RE | Admit: 2014-11-07 | Discharge: 2014-11-07 | Disposition: A | Discharge status: Home / Self Care | Payer: Medicaid Other | Source: Ambulatory Visit

## 2014-11-07 DIAGNOSIS — Z1231 Encounter for screening mammogram for malignant neoplasm of breast: Secondary | ICD-10-CM

## 2014-11-12 ENCOUNTER — Emergency Department (HOSPITAL_COMMUNITY)
Admission: EM | Admit: 2014-11-12 | Discharge: 2014-11-12 | Disposition: A | Discharge status: Home / Self Care | Payer: Medicaid Other | Attending: Emergency Medicine | Admitting: Emergency Medicine

## 2014-11-12 ENCOUNTER — Emergency Department (HOSPITAL_COMMUNITY): Payer: Medicaid Other

## 2014-11-12 ENCOUNTER — Encounter (HOSPITAL_COMMUNITY): Payer: Self-pay | Admitting: Emergency Medicine

## 2014-11-12 DIAGNOSIS — M79621 Pain in right upper arm: Secondary | ICD-10-CM | POA: Insufficient documentation

## 2014-11-12 DIAGNOSIS — R05 Cough: Secondary | ICD-10-CM | POA: Insufficient documentation

## 2014-11-12 DIAGNOSIS — Z79899 Other long term (current) drug therapy: Secondary | ICD-10-CM | POA: Diagnosis not present

## 2014-11-12 DIAGNOSIS — M79622 Pain in left upper arm: Secondary | ICD-10-CM | POA: Insufficient documentation

## 2014-11-12 DIAGNOSIS — D649 Anemia, unspecified: Secondary | ICD-10-CM | POA: Insufficient documentation

## 2014-11-12 DIAGNOSIS — M79601 Pain in right arm: Secondary | ICD-10-CM

## 2014-11-12 DIAGNOSIS — M79602 Pain in left arm: Secondary | ICD-10-CM

## 2014-11-12 DIAGNOSIS — M79603 Pain in arm, unspecified: Secondary | ICD-10-CM | POA: Diagnosis present

## 2014-11-12 DIAGNOSIS — Z9889 Other specified postprocedural states: Secondary | ICD-10-CM | POA: Insufficient documentation

## 2014-11-12 LAB — CBC WITH DIFFERENTIAL/PLATELET
Basophils Absolute: 0 10*3/uL (ref 0.0–0.1)
Basophils Relative: 1 % (ref 0–1)
Eosinophils Absolute: 0.1 10*3/uL (ref 0.0–0.7)
Eosinophils Relative: 2 % (ref 0–5)
HCT: 30.1 % — ABNORMAL LOW (ref 36.0–46.0)
Hemoglobin: 9.5 g/dL — ABNORMAL LOW (ref 12.0–15.0)
Lymphocytes Relative: 32 % (ref 12–46)
Lymphs Abs: 1.1 10*3/uL (ref 0.7–4.0)
MCH: 22.8 pg — ABNORMAL LOW (ref 26.0–34.0)
MCHC: 31.6 g/dL (ref 30.0–36.0)
MCV: 72.2 fL — ABNORMAL LOW (ref 78.0–100.0)
Monocytes Absolute: 0.3 10*3/uL (ref 0.1–1.0)
Monocytes Relative: 9 % (ref 3–12)
Neutro Abs: 2 10*3/uL (ref 1.7–7.7)
Neutrophils Relative %: 56 % (ref 43–77)
Platelets: 342 10*3/uL (ref 150–400)
RBC: 4.17 MIL/uL (ref 3.87–5.11)
RDW: 17.2 % — ABNORMAL HIGH (ref 11.5–15.5)
WBC: 3.5 10*3/uL — ABNORMAL LOW (ref 4.0–10.5)

## 2014-11-12 LAB — BASIC METABOLIC PANEL
Anion gap: 9 (ref 5–15)
BUN: 7 mg/dL (ref 6–23)
CO2: 22 mmol/L (ref 19–32)
Calcium: 8.9 mg/dL (ref 8.4–10.5)
Chloride: 107 mEq/L (ref 96–112)
Creatinine, Ser: 0.84 mg/dL (ref 0.50–1.10)
GFR calc Af Amer: >90 mL/min (ref >90)
GFR calc non Af Amer: 83 mL/min — ABNORMAL LOW (ref >90)
Glucose, Bld: 96 mg/dL (ref 70–99)
Potassium: 4 mmol/L (ref 3.5–5.1)
Sodium: 138 mmol/L (ref 135–145)

## 2014-11-12 MED ORDER — IBUPROFEN 600 MG PO TABS: 600.0000 mg | ORAL_TABLET | Freq: Four times a day (QID) | ORAL | Status: DC | PRN

## 2014-11-12 MED ORDER — IBUPROFEN 400 MG PO TABS
600.0000 mg | ORAL_TABLET | Freq: Once | ORAL | Status: AC
  Administered 2014-11-12: 600 mg via ORAL
  Filled 2014-11-12 (×2): qty 1

## 2014-11-12 MED ORDER — OXYCODONE-ACETAMINOPHEN 5-325 MG PO TABS
2.0000 | ORAL_TABLET | Freq: Once | ORAL | Status: AC
  Administered 2014-11-12: 2 via ORAL
  Filled 2014-11-12: qty 2

## 2014-11-12 MED ORDER — PREDNISONE 20 MG PO TABS: 20.0000 mg | ORAL_TABLET | Freq: Every day | ORAL | Status: DC

## 2014-11-12 MED ORDER — OXYCODONE-ACETAMINOPHEN 5-325 MG PO TABS: 1.0000 | ORAL_TABLET | ORAL | Status: DC | PRN

## 2014-11-12 MED ORDER — PREDNISONE 20 MG PO TABS
40.0000 mg | ORAL_TABLET | Freq: Once | ORAL | Status: AC
  Administered 2014-11-12: 40 mg via ORAL
  Filled 2014-11-12: qty 2

## 2014-11-12 NOTE — Discharge Instructions (Signed)
Musculoskeletal Pain Musculoskeletal pain is muscle and boney aches and pains. These pains can occur in any part of the body. Your caregiver may treat you without knowing the cause of the pain. They may treat you if blood or urine tests, X-rays, and other tests were normal.  CAUSES There is often not a definite cause or reason for these pains. These pains may be caused by a type of germ (virus). The discomfort may also come from overuse. Overuse includes working out too hard when your body is not fit. Boney aches also come from weather changes. Bone is sensitive to atmospheric pressure changes. HOME CARE INSTRUCTIONS   Ask when your test results will be ready. Make sure you get your test results.  Only take over-the-counter or prescription medicines for pain, discomfort, or fever as directed by your caregiver. If you were given medications for your condition, do not drive, operate machinery or power tools, or sign legal documents for 24 hours. Do not drink alcohol. Do not take sleeping pills or other medications that may interfere with treatment.  Continue all activities unless the activities cause more pain. When the pain lessens, slowly resume normal activities. Gradually increase the intensity and duration of the activities or exercise.  During periods of severe pain, bed rest may be helpful. Lay or sit in any position that is comfortable.  Putting ice on the injured area.  Put ice in a bag.  Place a towel between your skin and the bag.  Leave the ice on for 15 to 20 minutes, 3 to 4 times a day.  Follow up with your caregiver for continued problems and no reason can be found for the pain. If the pain becomes worse or does not go away, it may be necessary to repeat tests or do additional testing. Your caregiver may need to look further for a possible cause. SEEK IMMEDIATE MEDICAL CARE IF:  You have pain that is getting worse and is not relieved by medications.  You develop chest pain  that is associated with shortness or breath, sweating, feeling sick to your stomach (nauseous), or throw up (vomit).  Your pain becomes localized to the abdomen.  You develop any new symptoms that seem different or that concern you. MAKE SURE YOU:   Understand these instructions.  Will watch your condition.  Will get help right away if you are not doing well or get worse. Document Released: 11/01/2005 Document Revised: 01/24/2012 Document Reviewed: 07/06/2013 [facility] Patient Information 2015 [place], [place]. This information is not intended to replace advice given to you by your health care provider. Make sure you discuss any questions you have with your health care provider.  Pain of Unknown Etiology (Pain Without a Known Cause) You have come to your caregiver because of pain. Pain can occur in any part of the body. Often there is not a definite cause. If your laboratory (blood or urine) work was normal and X-rays or other studies were normal, your caregiver may treat you without knowing the cause of the pain. An example of this is the headache. Most headaches are diagnosed by taking a history. This means your caregiver asks you questions about your headaches. Your caregiver determines a treatment based on your answers. Usually testing done for headaches is normal. Often testing is not done unless there is no response to medications. Regardless of where your pain is located today, you can be given medications to make you comfortable. If no physical cause of pain can be found, most  cases of pain will gradually leave as suddenly as they came.  If you have a painful condition and no reason can be found for the pain, it is important that you follow up with your caregiver. If the pain becomes worse or does not go away, it may be necessary to repeat tests and look further for a possible cause.  Only take over-the-counter or prescription medicines for pain, discomfort, or fever as directed by your  caregiver.  For the protection of your privacy, test results cannot be given over the phone. Make sure you receive the results of your test. Ask how these results are to be obtained if you have not been informed. It is your responsibility to obtain your test results.  You may continue all activities unless the activities cause more pain. When the pain lessens, it is important to gradually resume normal activities. Resume activities by beginning slowly and gradually increasing the intensity and duration of the activities or exercise. During periods of severe pain, bed rest may be helpful. Lie or sit in any position that is comfortable.  Ice used for acute (sudden) conditions may be effective. Use a large plastic bag filled with ice and wrapped in a towel. This may provide pain relief.  See your caregiver for continued problems. Your caregiver can help or refer you for exercises or physical therapy if necessary. If you were given medications for your condition, do not drive, operate machinery or power tools, or sign legal documents for 24 hours. Do not drink alcohol, take sleeping pills, or take other medications that may interfere with treatment. See your caregiver immediately if you have pain that is becoming worse and not relieved by medications. Document Released: 07/27/2001 Document Revised: 08/22/2013 Document Reviewed: 11/01/2005 [facility] Patient Information 2015 [place], [place]. This information is not intended to replace advice given to you by your health care provider. Make sure you discuss any questions you have with your health care provider.

## 2014-11-12 NOTE — ED Notes (Signed)
Pt c/o bilateral arm and hand pain; pt sts pain with cough and body aches with nausea; pt sts bilateral hand pain

## 2014-11-12 NOTE — ED Notes (Signed)
Pt sts pain her right arm started a few days ago.  Denies any injury.  Sts heat, aspirin and massage have not minimized pain at all.

## 2014-11-12 NOTE — ED Provider Notes (Signed)
CSN: [id number]     Arrival date & time 11/12/14  1246 History   First MD Initiated Contact with Patient 11/12/14 1604     Chief Complaint  Patient presents with  . Arm Pain  . Cough  . Generalized Body Aches     (Consider location/radiation/quality/duration/timing/severity/associated sxs/prior Treatment) HPI   43 year old female with bilateral upper extremity pain. Intermittent for the past several months. Most recently it started again approximately 3 days ago. Gradual onset in her right hand. He had having pain in her left hand shortly later. The pain extends proximally up the arm to Shoulder level. Feels like a deep ache. Denies neck pain. No joint swelling. No rash.Not significantly worse with any certain movements or positions. Pt does frequently rearrange heavy furniture around the house "because I'm OCD." She says she had been doing this well before symptoms began though. Patient has been having similar type pain intermittently since June or July. She is better this past July had a cardiac workup including cardiac cath on 05/30/2014 which showed normal LV function and normal coronary anatomy. She reports at one point someone prescribed her steroids for these symptoms and they improved, but she cannot remember whom and what specifically was being treated. No diagnosed rheumatological process.   Past Medical History  Diagnosis Date  . Anemia   . Ejection fraction    Past Surgical History  Procedure Laterality Date  . Tubal ligation    . Left heart catheterization with coronary angiogram N/A 05/30/2014    Procedure: LEFT HEART CATHETERIZATION WITH CORONARY ANGIOGRAM;  Surgeon: Peter M Martinique, MD;  Location: [facility] C[facility] LAB;  Service: Cardiovascular;  Laterality: N/A;   Family History  Problem Relation Age of Onset  . Hypertension Mother   . Diabetes Father   . Hypertension Maternal Aunt   . Hypertension Maternal Uncle   . Cancer Maternal Grandmother   . Hypertension Maternal  Grandmother   . Stroke Maternal Grandmother   . Stroke Maternal Grandfather    History  Substance Use Topics  . Smoking status: Never Smoker   . Smokeless tobacco: Not on file  . Alcohol Use: No   OB History    Gravida Para Term Preterm AB TAB SAB Ectopic Multiple Living   8 6 4 2 2  2   4      Review of Systems  All systems reviewed and negative, other than as noted in HPI.   Allergies  Review of patient's allergies indicates no known allergies.  Home Medications   Prior to Admission medications   Medication Sig Start Date End Date Taking? Authorizing Provider  albuterol (PROVENTIL HFA;VENTOLIN HFA) 108 (90 BASE) MCG/ACT inhaler Inhale 2 puffs into the lungs every 6 (six) hours as needed for wheezing or shortness of breath.    Historical Provider, MD  ferrous fumarate (HEMOCYTE - 106 MG FE) 325 (106 FE) MG TABS tablet Take 1 tablet by mouth.    Historical Provider, MD  HYDROcodone-acetaminophen (NORCO/VICODIN) 5-325 MG per tablet Take 1 tablet by mouth 2 (two) times daily as needed for severe pain. 07/23/14   Everlene Balls, MD  metoprolol tartrate (LOPRESSOR) 12.5 mg TABS tablet Take 0.5 tablets (12.5 mg total) by mouth 2 (two) times daily. 05/31/14   Almyra Deforest, PA   BP 136/79 mmHg  Pulse 75  Temp(Src) 98.6 F (37 C) (Oral)  Resp 22  SpO2 100% Physical Exam  Constitutional: She appears well-developed and well-nourished. No distress.  HENT:  Head: Normocephalic and atraumatic.  Eyes: Conjunctivae are normal. Right eye exhibits no discharge. Left eye exhibits no discharge.  Neck: Neck supple.  Cardiovascular: Normal rate, regular rhythm and normal heart sounds.  Exam reveals no gallop and no friction rub.   No murmur heard. Pulmonary/Chest: Effort normal and breath sounds normal. No respiratory distress.  Abdominal: Soft. She exhibits no distension. There is no tenderness.  Musculoskeletal: She exhibits no edema or tenderness.  Upper extremities normal in appearance and  symmetric as compared to each other. He will to actively range her shoulders, elbows wrists and digits without any apparent difficulty. No joint swelling. No rash. Easily palpable radial pulses bilaterally. Sensation is intact to light touch at fingertips and cap refill in fingertips is brisk. Pain is not worsening with palpation. No midline spinal tenderness.   Neurological: She is alert.  Skin: Skin is warm and dry.  Psychiatric: She has a normal mood and affect. Her behavior is normal. Thought content normal.  Nursing note and vitals reviewed.   ED Course  Procedures (including critical care time) Labs Review Labs Reviewed  CBC WITH DIFFERENTIAL - Abnormal; Notable for the following:    WBC 3.5 (*)    Hemoglobin 9.5 (*)    HCT 30.1 (*)    MCV 72.2 (*)    MCH 22.8 (*)    RDW 17.2 (*)    All other components within normal limits  BASIC METABOLIC PANEL - Abnormal; Notable for the following:    GFR calc non Af Amer 83 (*)    All other components within normal limits    Imaging Review Dg Chest 2 View (if Patient Has Fever And/or Copd)  11/12/2014   CLINICAL DATA:  Chest pain extending to both law arms, 3 days duration. Nausea and dizziness.  EXAM: CHEST  2 VIEW  COMPARISON:  07/23/2014  FINDINGS: Heart size is normal. Mediastinal shadows are normal. The lungs are clear. No effusions. No bony abnormalities.  IMPRESSION: No active cardiopulmonary disease.   Electronically Signed   By: Nelson Chimes M.D.   On: 11/12/2014 14:13     EKG Interpretation None      MDM   Final diagnoses:  Bilateral arm pain    43 year old female with roughly symmetric pain in her bilateral upper extremities. Her exam itself was pretty unremarkable. I do not appreciate any joint swelling. No rash. Pain does not seem sniffly change her range of motion. She is neurovascularly intact. She reports prior improvement of symptoms when previously on steroids. She has no PCP. Discussed with her the need to  establish 1. Will treat symptomatically at this point. Lowsuspicion for emergent process. Return precautions were discussed.    Virgel Manifold, MD 11/12/14 [phone]

## 2014-11-21 ENCOUNTER — Emergency Department (HOSPITAL_COMMUNITY)
Admission: EM | Admit: 2014-11-21 | Discharge: 2014-11-21 | Disposition: A | Discharge status: Home / Self Care | Payer: Medicaid Other | Attending: Emergency Medicine | Admitting: Emergency Medicine

## 2014-11-21 ENCOUNTER — Encounter (HOSPITAL_COMMUNITY): Payer: Self-pay | Admitting: *Deleted

## 2014-11-21 DIAGNOSIS — Z7952 Long term (current) use of systemic steroids: Secondary | ICD-10-CM | POA: Insufficient documentation

## 2014-11-21 DIAGNOSIS — Y9389 Activity, other specified: Secondary | ICD-10-CM | POA: Diagnosis not present

## 2014-11-21 DIAGNOSIS — Y998 Other external cause status: Secondary | ICD-10-CM | POA: Insufficient documentation

## 2014-11-21 DIAGNOSIS — Y92410 Unspecified street and highway as the place of occurrence of the external cause: Secondary | ICD-10-CM | POA: Insufficient documentation

## 2014-11-21 DIAGNOSIS — Z7982 Long term (current) use of aspirin: Secondary | ICD-10-CM | POA: Diagnosis not present

## 2014-11-21 DIAGNOSIS — S39002A Unspecified injury of muscle, fascia and tendon of lower back, initial encounter: Secondary | ICD-10-CM | POA: Insufficient documentation

## 2014-11-21 DIAGNOSIS — S3992XA Unspecified injury of lower back, initial encounter: Secondary | ICD-10-CM | POA: Diagnosis present

## 2014-11-21 DIAGNOSIS — D649 Anemia, unspecified: Secondary | ICD-10-CM | POA: Insufficient documentation

## 2014-11-21 DIAGNOSIS — S199XXA Unspecified injury of neck, initial encounter: Secondary | ICD-10-CM | POA: Diagnosis not present

## 2014-11-21 DIAGNOSIS — M791 Myalgia, unspecified site: Secondary | ICD-10-CM

## 2014-11-21 DIAGNOSIS — Z79899 Other long term (current) drug therapy: Secondary | ICD-10-CM | POA: Insufficient documentation

## 2014-11-21 NOTE — Discharge Instructions (Signed)
Read the information below.  You may return to the Emergency Department at any time for worsening condition or any new symptoms that concern you. °
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
## 2015-02-18 ENCOUNTER — Emergency Department (HOSPITAL_COMMUNITY)
Admission: EM | Admit: 2015-02-18 | Discharge: 2015-02-18 | Disposition: A | Discharge status: Home / Self Care | Payer: Medicaid Other | Source: Home / Self Care | Attending: Family Medicine | Admitting: Family Medicine

## 2015-02-18 ENCOUNTER — Encounter (HOSPITAL_COMMUNITY): Payer: Self-pay | Admitting: Emergency Medicine

## 2015-02-18 DIAGNOSIS — J069 Acute upper respiratory infection, unspecified: Secondary | ICD-10-CM

## 2015-02-18 LAB — POCT RAPID STREP A: STREPTOCOCCUS, GROUP A SCREEN (DIRECT): NEGATIVE

## 2015-02-18 MED ORDER — BENZONATATE 100 MG PO CAPS: 100.0000 mg | ORAL_CAPSULE | Freq: Three times a day (TID) | ORAL | Status: DC | PRN

## 2015-02-18 MED ORDER — IPRATROPIUM BROMIDE 0.06 % NA SOLN: 2.0000 | Freq: Four times a day (QID) | NASAL | Status: DC

## 2015-02-18 NOTE — ED Provider Notes (Signed)
CSN: [id number]     Arrival date & time 02/18/15  4098 History   First MD Initiated Contact with Patient 02/18/15 [phone]     Chief Complaint  Patient presents with  . Sore Throat   (Consider location/radiation/quality/duration/timing/severity/associated sxs/prior Treatment) Patient is a 44 y.o. female presenting with URI. The history is provided by the patient.  URI Presenting symptoms: congestion, cough, rhinorrhea and sore throat   Presenting symptoms: no ear pain, no facial pain, no fatigue and no fever   Sore throat:    Severity:  Mild   Onset quality:  Gradual   Duration:  12 hours   Timing:  Constant   Progression:  Unchanged Severity:  Mild Onset quality:  Gradual Duration:  12 hours Timing:  Constant Progression:  Unchanged Chronicity:  New Relieved by:  None tried Ineffective treatments:  None tried Associated symptoms: no arthralgias, no headaches, no myalgias, no neck pain, no sinus pain, no sneezing, no swollen glands and no wheezing   Associated symptoms comment:  +PND Risk factors: sick contacts   Risk factors comment:  +both her children currently ill with URI sx   Past Medical History  Diagnosis Date  . Anemia   . Ejection fraction    Past Surgical History  Procedure Laterality Date  . Tubal ligation    . Left heart catheterization with coronary angiogram N/A 05/30/2014    Procedure: LEFT HEART CATHETERIZATION WITH CORONARY ANGIOGRAM;  Surgeon: Peter M Martinique, MD;  Location[facility] CATH LAB;  Service: Cardiovascular;  Laterality: N/A;   Family History  Problem Relation Age of Onset  . Hypertension Mother   . Diabetes Father   . Hypertension Maternal Aunt   . Hypertension Maternal Uncle   . Cancer Maternal Grandmother   . Hypertension Maternal Grandmother   . Stroke Maternal Grandmother   . Stroke Maternal Grandfather    History  Substance Use Topics  . Smoking status: Never Smoker   . Smokeless tobacco: Not on file  . Alcohol Use: No   OB History     Gravida Para Term Preterm AB TAB SAB Ectopic Multiple Living   8 6 4 2 2  2   4      Review of Systems  Constitutional: Negative for fever and fatigue.  HENT: Positive for congestion, rhinorrhea and sore throat. Negative for ear pain and sneezing.   Respiratory: Positive for cough. Negative for wheezing.   Musculoskeletal: Negative for myalgias, arthralgias and neck pain.  Neurological: Negative for headaches.  All other systems reviewed and are negative.   Allergies  Review of patient's allergies indicates no known allergies.  Home Medications   Prior to Admission medications   Medication Sig Start Date End Date Taking? Authorizing Provider  albuterol (PROVENTIL HFA;VENTOLIN HFA) 108 (90 BASE) MCG/ACT inhaler Inhale 2 puffs into the lungs every 6 (six) hours as needed for wheezing or shortness of breath.    Historical Provider, MD  aspirin 325 MG tablet Take 325 mg by mouth every 6 (six) hours as needed for mild pain.    Historical Provider, MD  benzonatate (TESSALON) 100 MG capsule Take 1 capsule (100 mg total) by mouth 3 (three) times daily as needed for cough. 02/18/15   Audelia Hives Presson, PA  ferrous fumarate (HEMOCYTE - 106 MG FE) 325 (106 FE) MG TABS tablet Take 1 tablet by mouth.    Historical Provider, MD  HYDROcodone-acetaminophen (NORCO/VICODIN) 5-325 MG per tablet Take 1 tablet by mouth 2 (two) times daily as needed  for severe pain. Patient not taking: Reported on 11/12/2014 07/23/14   Everlene Balls, MD  ibuprofen (ADVIL,MOTRIN) 600 MG tablet Take 1 tablet (600 mg total) by mouth every 6 (six) hours as needed. 11/12/14   Virgel Manifold, MD  ipratropium (ATROVENT) 0.06 % nasal spray Place 2 sprays into both nostrils 4 (four) times daily. For nasal congestion and runny nose 02/18/15   Lutricia Feil, PA  metoprolol tartrate (LOPRESSOR) 12.5 mg TABS tablet Take 0.5 tablets (12.5 mg total) by mouth 2 (two) times daily. 05/31/14   Almyra Deforest, PA  oxyCODONE-acetaminophen  (PERCOCET/ROXICET) 5-325 MG per tablet Take 1 tablet by mouth every 4 (four) hours as needed for severe pain. 11/12/14   Virgel Manifold, MD  predniSONE (DELTASONE) 20 MG tablet Take 1 tablet (20 mg total) by mouth daily. 11/12/14   Virgel Manifold, MD   BP 130/92 mmHg  Pulse 85  Temp(Src) 98.1 F (36.7 C) (Oral)  Resp 16  SpO2 100% Physical Exam  Constitutional: She is oriented to person, place, and time. She appears well-developed and well-nourished.  HENT:  Head: Normocephalic and atraumatic.  Right Ear: Hearing, tympanic membrane, external ear and ear canal normal.  Left Ear: Hearing, tympanic membrane, external ear and ear canal normal.  Nose: Nose normal.  Mouth/Throat: Uvula is midline, oropharynx is clear and moist and mucous membranes are normal.  Eyes: Conjunctivae are normal. Right eye exhibits no discharge. Left eye exhibits no discharge. No scleral icterus.  Neck: Normal range of motion. Neck supple.  Cardiovascular: Normal rate, regular rhythm and normal heart sounds.   Pulmonary/Chest: Effort normal and breath sounds normal. No respiratory distress. She has no wheezes.  Musculoskeletal: Normal range of motion.  Lymphadenopathy:    She has no cervical adenopathy.  Neurological: She is alert and oriented to person, place, and time.  Skin: Skin is warm and dry.  Psychiatric: She has a normal mood and affect. Her behavior is normal.  Nursing note and vitals reviewed.   ED Course  Procedures (including critical care time) Labs Review Labs Reviewed  POCT RAPID STREP A ([facility] URG CARE ONLY)    Imaging Review No results found.   MDM   1. URI (upper respiratory infection)    Mild URI Symptomatic care at home Atrovent  Tessalon Rapid strep negative    Lutricia Feil, [place] 02/18/15 [phone]

## 2015-02-18 NOTE — Discharge Instructions (Signed)
Mild common cold Symptomatic care at home Atrovent for nasal congestion Tessalon for cough Rapid strep negative Upper Respiratory Infection, Adult An upper respiratory infection (URI) is also sometimes known as the common cold. The upper respiratory tract includes the nose, sinuses, throat, trachea, and bronchi. Bronchi are the airways leading to the lungs. Most people improve within 1 week, but symptoms can last up to 2 weeks. A residual cough may last even longer.  CAUSES Many different viruses can infect the tissues lining the upper respiratory tract. The tissues become irritated and inflamed and often become very moist. Mucus production is also common. A cold is contagious. You can easily spread the virus to others by oral contact. This includes kissing, sharing a glass, coughing, or sneezing. Touching your mouth or nose and then touching a surface, which is then touched by another person, can also spread the virus. SYMPTOMS  Symptoms typically develop 1 to 3 days after you come in contact with a cold virus. Symptoms vary from person to person. They may include:  Runny nose.  Sneezing.  Nasal congestion.  Sinus irritation.  Sore throat.  Loss of voice (laryngitis).  Cough.  Fatigue.  Muscle aches.  Loss of appetite.  Headache.  Low-grade fever. DIAGNOSIS  You might diagnose your own cold based on familiar symptoms, since most people get a cold 2 to 3 times a year. Your caregiver can confirm this based on your exam. Most importantly, your caregiver can check that your symptoms are not due to another disease such as strep throat, sinusitis, pneumonia, asthma, or epiglottitis. Blood tests, throat tests, and X-rays are not necessary to diagnose a common cold, but they may sometimes be helpful in excluding other more serious diseases. Your caregiver will decide if any further tests are required. RISKS AND COMPLICATIONS  You may be at risk for a more severe case of the common cold  if you smoke cigarettes, have chronic heart disease (such as heart failure) or lung disease (such as asthma), or if you have a weakened immune system. The very young and very old are also at risk for more serious infections. Bacterial sinusitis, middle ear infections, and bacterial pneumonia can complicate the common cold. The common cold can worsen asthma and chronic obstructive pulmonary disease (COPD). Sometimes, these complications can require emergency medical care and may be life-threatening. PREVENTION  The best way to protect against getting a cold is to practice good hygiene. Avoid oral or hand contact with people with cold symptoms. Wash your hands often if contact occurs. There is no clear evidence that vitamin C, vitamin E, echinacea, or exercise reduces the chance of developing a cold. However, it is always recommended to get plenty of rest and practice good nutrition. TREATMENT  Treatment is directed at relieving symptoms. There is no cure. Antibiotics are not effective, because the infection is caused by a virus, not by bacteria. Treatment may include:  Increased fluid intake. Sports drinks offer valuable electrolytes, sugars, and fluids.  Breathing heated mist or steam (vaporizer or shower).  Eating chicken soup or other clear broths, and maintaining good nutrition.  Getting plenty of rest.  Using gargles or lozenges for comfort.  Controlling fevers with ibuprofen or acetaminophen as directed by your caregiver.  Increasing usage of your inhaler if you have asthma. Zinc gel and zinc lozenges, taken in the first 24 hours of the common cold, can shorten the duration and lessen the severity of symptoms. Pain medicines may help with fever, muscle aches, and  throat pain. A variety of non-prescription medicines are available to treat congestion and runny nose. Your caregiver can make recommendations and may suggest nasal or lung inhalers for other symptoms.  HOME CARE INSTRUCTIONS    Only take over-the-counter or prescription medicines for pain, discomfort, or fever as directed by your caregiver.  Use a warm mist humidifier or inhale steam from a shower to increase air moisture. This may keep secretions moist and make it easier to breathe.  Drink enough water and fluids to keep your urine clear or pale yellow.  Rest as needed.  Return to work when your temperature has returned to normal or as your caregiver advises. You may need to stay home longer to avoid infecting others. You can also use a face mask and careful hand washing to prevent spread of the virus. SEEK MEDICAL CARE IF:   After the first few days, you feel you are getting worse rather than better.  You need your caregiver's advice about medicines to control symptoms.  You develop chills, worsening shortness of breath, or brown or red sputum. These may be signs of pneumonia.  You develop yellow or brown nasal discharge or pain in the face, especially when you bend forward. These may be signs of sinusitis.  You develop a fever, swollen neck glands, pain with swallowing, or white areas in the back of your throat. These may be signs of strep throat. SEEK IMMEDIATE MEDICAL CARE IF:   You have a fever.  You develop severe or persistent headache, ear pain, sinus pain, or chest pain.  You develop wheezing, a prolonged cough, cough up blood, or have a change in your usual mucus (if you have chronic lung disease).  You develop sore muscles or a stiff neck. Document Released: 04/27/2001 Document Revised: 01/24/2012 Document Reviewed: 02/06/2014 [facility] Patient Information 2015 [place], [place]. This information is not intended to replace advice given to you by your health care provider. Make sure you discuss any questions you have with your health care provider.

## 2015-02-18 NOTE — ED Notes (Signed)
Reports waking during the night with sore throat, dry cough, slight runny nose.

## 2015-02-20 LAB — CULTURE, GROUP A STREP: Strep A Culture: NEGATIVE

## 2015-05-11 ENCOUNTER — Inpatient Hospital Stay (HOSPITAL_COMMUNITY)
Admission: AD | Admit: 2015-05-11 | Discharge: 2015-05-11 | Disposition: A | Discharge status: Home / Self Care | Payer: Medicaid Other | Source: Ambulatory Visit | Attending: Obstetrics | Admitting: Obstetrics

## 2015-05-11 ENCOUNTER — Encounter (HOSPITAL_COMMUNITY): Payer: Self-pay | Admitting: *Deleted

## 2015-05-11 DIAGNOSIS — R102 Pelvic and perineal pain: Secondary | ICD-10-CM | POA: Insufficient documentation

## 2015-05-11 DIAGNOSIS — N898 Other specified noninflammatory disorders of vagina: Secondary | ICD-10-CM | POA: Diagnosis present

## 2015-05-11 DIAGNOSIS — B373 Candidiasis of vulva and vagina: Secondary | ICD-10-CM | POA: Insufficient documentation

## 2015-05-11 DIAGNOSIS — B3731 Acute candidiasis of vulva and vagina: Secondary | ICD-10-CM

## 2015-05-11 LAB — WET PREP, GENITAL
Clue Cells Wet Prep HPF POC: NONE SEEN
Trich, Wet Prep: NONE SEEN

## 2015-05-11 LAB — URINALYSIS, ROUTINE W REFLEX MICROSCOPIC
BILIRUBIN URINE: NEGATIVE
Glucose, UA: 100 mg/dL — AB
HGB URINE DIPSTICK: NEGATIVE
KETONES UR: NEGATIVE mg/dL
Nitrite: NEGATIVE
PH: 5.5 (ref 5.0–8.0)
Protein, ur: NEGATIVE mg/dL
Specific Gravity, Urine: 1.02 (ref 1.005–1.030)
Urobilinogen, UA: 0.2 mg/dL (ref 0.0–1.0)

## 2015-05-11 LAB — POCT PREGNANCY, URINE: PREG TEST UR: NEGATIVE

## 2015-05-11 LAB — URINE MICROSCOPIC-ADD ON

## 2015-05-11 MED ORDER — TRIAMCINOLONE ACETONIDE 0.1 % EX CREA: 1.0000 "application " | TOPICAL_CREAM | Freq: Two times a day (BID) | CUTANEOUS | Status: DC

## 2015-05-11 MED ORDER — NYSTATIN 100000 UNIT/GM EX CREA: 1.0000 "application " | TOPICAL_CREAM | Freq: Two times a day (BID) | CUTANEOUS | Status: DC

## 2015-05-11 MED ORDER — ACYCLOVIR 400 MG PO TABS: 400.0000 mg | ORAL_TABLET | Freq: Three times a day (TID) | ORAL | Status: DC

## 2015-05-11 MED ORDER — FLUCONAZOLE 150 MG PO TABS: 150.0000 mg | ORAL_TABLET | Freq: Once | ORAL | Status: DC

## 2015-05-11 NOTE — Discharge Instructions (Signed)

## 2015-05-11 NOTE — MAU Note (Signed)
Patient presents stating that she is not pregnant but has had lower pelvic pain and vaginal discharge X 2 days following her menstrual cycle.

## 2015-05-11 NOTE — MAU Provider Note (Signed)
History     CSN: [id number]  Arrival date and time: 05/11/15 1329   First Provider Initiated Contact with Patient 05/11/15 1356      Chief Complaint  Patient presents with  . Pelvic Pain  . Vaginal Discharge   HPI 44 y.o. Sheila [name] with vulvar irritation and vaginal discharge x 7 days. States a few days prior to this episode she had unprotected intercourse w/ her husband for the first time in some time and she is concerned he has been with other people. She also states she just stopped her menstrual period, which was longer than usual, so she thinks wearing pads may have caused some irritation. 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   Past Medical History  Diagnosis Date  . Anemia   . Ejection fraction     Past Surgical History  Procedure Laterality Date  . Tubal ligation    . Left heart catheterization with coronary angiogram N/A 05/30/2014    Procedure: LEFT HEART CATHETERIZATION WITH CORONARY ANGIOGRAM;  Surgeon: Peter M Martinique, MD;  Location: [facility] CATH LAB;  Service: Cardiovascular;  Laterality: N/A;    Family History  Problem Relation Age of Onset  . Hypertension Mother   . Diabetes Father   . Hypertension Maternal Aunt   . Hypertension Maternal Uncle   . Cancer Maternal Grandmother   . Hypertension Maternal Grandmother   . Stroke Maternal Grandmother   . Stroke Maternal Grandfather     History  Substance Use Topics  . Smoking status: Never Smoker   . Smokeless tobacco: Not on file  . Alcohol Use: No    Allergies: No Known Allergies  Prescriptions prior to admission  Medication Sig Dispense Refill Last Dose  . ferrous fumarate (HEMOCYTE - 106 MG FE) 325 (106 FE) MG TABS tablet Take 1 tablet by mouth.   Past Month at Unknown time  . vitamin B-12 (CYANOCOBALAMIN) 50 MCG tablet Take 50 mcg by mouth daily.   05/10/2015 at Unknown time  . albuterol (PROVENTIL HFA;VENTOLIN HFA) 108 (90 BASE) MCG/ACT inhaler Inhale 2 puffs into  the lungs every 6 (six) hours as needed for wheezing or shortness of breath.   prn  . aspirin 325 MG tablet Take 325 mg by mouth every 6 (six) hours as needed for mild pain.   prn  . benzonatate (TESSALON) 100 MG capsule Take 1 capsule (100 mg total) by mouth 3 (three) times daily as needed for cough. (Patient not taking: Reported on 05/11/2015) 21 capsule 0 Completed Course at Unknown time  . HYDROcodone-acetaminophen (NORCO/VICODIN) 5-325 MG per tablet Take 1 tablet by mouth 2 (two) times daily as needed for severe pain. (Patient not taking: Reported on 11/12/2014) 8 tablet 0 Not Taking at Unknown time  . ibuprofen (ADVIL,MOTRIN) 600 MG tablet Take 1 tablet (600 mg total) by mouth every 6 (six) hours as needed. (Patient not taking: Reported on 05/11/2015) 30 tablet 0 Not Taking at Unknown time  . ipratropium (ATROVENT) 0.06 % nasal spray Place 2 sprays into both nostrils 4 (four) times daily. For nasal congestion and runny nose (Patient not taking: Reported on 05/11/2015) 15 mL 0 Not Taking at Unknown time  . metoprolol tartrate (LOPRESSOR) 12.5 mg TABS tablet Take 0.5 tablets (12.5 mg total) by mouth 2 (two) times daily. 30 tablet 6 prn  . oxyCODONE-acetaminophen (PERCOCET/ROXICET) 5-325 MG per tablet Take 1 tablet by mouth every 4 (four) hours as needed for severe pain. (  Patient not taking: Reported on 05/11/2015) 12 tablet 0 Not Taking at Unknown time  . predniSONE (DELTASONE) 20 MG tablet Take 1 tablet (20 mg total) by mouth daily. (Patient not taking: Reported on 05/11/2015) 6 tablet 0 Not Taking at Unknown time    Review of Systems  Constitutional: Negative.   Respiratory: Negative.   Cardiovascular: Negative.   Gastrointestinal: Negative for nausea, vomiting, abdominal pain, diarrhea and constipation.  Genitourinary: Negative for dysuria, urgency, frequency, hematuria and flank pain.       + discharge, irritation   Musculoskeletal: Negative.   Neurological: Negative.     Psychiatric/Behavioral: Negative.    Physical Exam   Blood pressure 133/73, pulse 95, temperature 98.7 F (37.1 C), temperature source Oral, resp. rate 18, last menstrual period 04/18/2015.  Physical Exam  Nursing note and vitals reviewed. Constitutional: She is oriented to person, place, and time. She appears well-developed and well-nourished. No distress.  Cardiovascular: Normal rate.   Respiratory: Effort normal.  Genitourinary:    There is lesion on the right labia. There is lesion on the left labia. Uterus is not enlarged and not tender. Cervix exhibits no motion tenderness, no discharge and no friability. Right adnexum displays no mass, no tenderness and no fullness. Left adnexum displays no mass, no tenderness and no fullness. No erythema or bleeding in the vagina. Vaginal discharge (curdy, thick, white) found.  Musculoskeletal: Normal range of motion.  Neurological: She is alert and oriented to person, place, and time.  Skin: Skin is warm and dry.  Psychiatric: She has a normal mood and affect.    MAU Course  Procedures  Results for orders placed or performed during the hospital encounter of 05/11/15 (from the past 24 hour(s))  Urinalysis, Routine w reflex microscopic (not at [facility])     Status: Abnormal   Collection Time: 05/11/15  1:33 PM  Result Value Ref Range   Color, Urine YELLOW YELLOW   APPearance CLEAR CLEAR   Specific Gravity, Urine 1.020 1.005 - 1.030   pH 5.5 5.0 - 8.0   Glucose, UA 100 (A) NEGATIVE mg/dL   Hgb urine dipstick NEGATIVE NEGATIVE   Bilirubin Urine NEGATIVE NEGATIVE   Ketones, ur NEGATIVE NEGATIVE mg/dL   Protein, ur NEGATIVE NEGATIVE mg/dL   Urobilinogen, UA 0.2 0.0 - 1.0 mg/dL   Nitrite NEGATIVE NEGATIVE   Leukocytes, UA TRACE (A) NEGATIVE  Urine microscopic-add on     Status: Abnormal   Collection Time: 05/11/15  1:33 PM  Result Value Ref Range   Squamous Epithelial / LPF RARE RARE   WBC, UA 0-2 <3 WBC/hpf   Bacteria, UA FEW (A) RARE    Urine-Other MUCOUS PRESENT   Pregnancy, urine POC     Status: None   Collection Time: 05/11/15  1:57 PM  Result Value Ref Range   Preg Test, Ur NEGATIVE NEGATIVE  Wet prep, genital     Status: Abnormal   Collection Time: 05/11/15  2:00 PM  Result Value Ref Range   Yeast Wet Prep HPF POC FEW (A) NONE SEEN   Trich, Wet Prep NONE SEEN NONE SEEN   Clue Cells Wet Prep HPF POC NONE SEEN NONE SEEN   WBC, Wet Prep HPF POC FEW (A) NONE SEEN     Assessment and Plan   1. Vaginal candidiasis   Diflucan, nystatin/triamcinolone for yeast. Pt w/ known HSV, appearance today not entirely c/w HSV outbreak, but more significant that typical yeast infection, will provide rx for Acyclovir for episodic HSV in  case of outbreak. Discussed outpatient follow up for repeat STI testing considering concerning exposure within last 2 weeks - HIV, RPR, GC/CT pending, discussed incubation period.     Medication List    STOP taking these medications        benzonatate 100 MG capsule  Commonly known as:  TESSALON     HYDROcodone-acetaminophen 5-325 MG per tablet  Commonly known as:  NORCO/VICODIN     ibuprofen 600 MG tablet  Commonly known as:  ADVIL,MOTRIN     ipratropium 0.06 % nasal spray  Commonly known as:  ATROVENT     oxyCODONE-acetaminophen 5-325 MG per tablet  Commonly known as:  PERCOCET/ROXICET     predniSONE 20 MG tablet  Commonly known as:  DELTASONE      TAKE these medications        acyclovir 400 MG tablet  Commonly known as:  ZOVIRAX  Take 1 tablet (400 mg total) by mouth 3 (three) times daily.     albuterol 108 (90 BASE) MCG/ACT inhaler  Commonly known as:  PROVENTIL HFA;VENTOLIN HFA  Inhale 2 puffs into the lungs every 6 (six) hours as needed for wheezing or shortness of breath.     aspirin 325 MG tablet  Take 325 mg by mouth every 6 (six) hours as needed for mild pain.     ferrous fumarate 325 (106 FE) MG Tabs tablet  Commonly known as:  HEMOCYTE - 106 mg FE  Take 1  tablet by mouth.     fluconazole 150 MG tablet  Commonly known as:  DIFLUCAN  Take 1 tablet (150 mg total) by mouth once. Repeat in 3 days.     metoprolol tartrate 12.5 mg Tabs tablet  Commonly known as:  LOPRESSOR  Take 0.5 tablets (12.5 mg total) by mouth 2 (two) times daily.     nystatin cream  Commonly known as:  MYCOSTATIN  Apply 1 application topically 2 (two) times daily.     triamcinolone cream 0.1 %  Commonly known as:  KENALOG  Apply 1 application topically 2 (two) times daily.     vitamin B-12 50 MCG tablet  Commonly known as:  CYANOCOBALAMIN  Take 50 mcg by mouth daily.        Follow-up Information    Follow up with Frederico Hamman, MD.   Specialty:  Obstetrics and Gynecology   Contact information:   [address] [place] [address] [phone]         [facility] 05/11/2015, 2:23 PM

## 2015-05-12 LAB — GC/CHLAMYDIA PROBE AMP (~~LOC~~) NOT AT ARMC
Chlamydia: NEGATIVE
Neisseria Gonorrhea: NEGATIVE

## 2015-05-12 LAB — RPR: RPR Ser Ql: NONREACTIVE

## 2015-05-12 LAB — HIV ANTIBODY (ROUTINE TESTING W REFLEX): HIV Screen 4th Generation wRfx: NONREACTIVE

## 2015-07-23 ENCOUNTER — Encounter (HOSPITAL_COMMUNITY): Payer: Self-pay | Admitting: *Deleted

## 2015-07-23 ENCOUNTER — Inpatient Hospital Stay (HOSPITAL_COMMUNITY)
Admission: AD | Admit: 2015-07-23 | Discharge: 2015-07-23 | Disposition: A | Discharge status: Home / Self Care | Payer: Medicaid Other | Source: Ambulatory Visit | Attending: Obstetrics | Admitting: Obstetrics

## 2015-07-23 DIAGNOSIS — N946 Dysmenorrhea, unspecified: Secondary | ICD-10-CM

## 2015-07-23 DIAGNOSIS — D509 Iron deficiency anemia, unspecified: Secondary | ICD-10-CM

## 2015-07-23 DIAGNOSIS — R109 Unspecified abdominal pain: Secondary | ICD-10-CM | POA: Diagnosis present

## 2015-07-23 DIAGNOSIS — Z7982 Long term (current) use of aspirin: Secondary | ICD-10-CM | POA: Diagnosis not present

## 2015-07-23 DIAGNOSIS — D649 Anemia, unspecified: Secondary | ICD-10-CM | POA: Diagnosis not present

## 2015-07-23 DIAGNOSIS — M545 Low back pain: Secondary | ICD-10-CM | POA: Insufficient documentation

## 2015-07-23 LAB — URINALYSIS, ROUTINE W REFLEX MICROSCOPIC
Bilirubin Urine: NEGATIVE
GLUCOSE, UA: NEGATIVE mg/dL
Ketones, ur: NEGATIVE mg/dL
Leukocytes, UA: NEGATIVE
Nitrite: NEGATIVE
PH: 6.5 (ref 5.0–8.0)
PROTEIN: NEGATIVE mg/dL
SPECIFIC GRAVITY, URINE: 1.015 (ref 1.005–1.030)
Urobilinogen, UA: 0.2 mg/dL (ref 0.0–1.0)

## 2015-07-23 LAB — POCT PREGNANCY, URINE: Preg Test, Ur: NEGATIVE

## 2015-07-23 LAB — URINE MICROSCOPIC-ADD ON

## 2015-07-23 LAB — WET PREP, GENITAL
Clue Cells Wet Prep HPF POC: NONE SEEN
Trich, Wet Prep: NONE SEEN
Yeast Wet Prep HPF POC: NONE SEEN

## 2015-07-23 LAB — CBC
HCT: 26.1 % — ABNORMAL LOW (ref 36.0–46.0)
Hemoglobin: 8.3 g/dL — ABNORMAL LOW (ref 12.0–15.0)
MCH: 22.5 pg — ABNORMAL LOW (ref 26.0–34.0)
MCHC: 31.8 g/dL (ref 30.0–36.0)
MCV: 70.7 fL — ABNORMAL LOW (ref 78.0–100.0)
Platelets: 306 10*3/uL (ref 150–400)
RBC: 3.69 MIL/uL — ABNORMAL LOW (ref 3.87–5.11)
RDW: 17.2 % — ABNORMAL HIGH (ref 11.5–15.5)
WBC: 4.1 10*3/uL (ref 4.0–10.5)

## 2015-07-23 MED ORDER — FERROUS SULFATE 325 (65 FE) MG PO TBEC: 325.0000 mg | DELAYED_RELEASE_TABLET | Freq: Two times a day (BID) | ORAL | Status: DC

## 2015-07-23 MED ORDER — NAPROXEN SODIUM 550 MG PO TABS: 550.0000 mg | ORAL_TABLET | Freq: Two times a day (BID) | ORAL | Status: DC

## 2015-07-23 MED ORDER — NAPROXEN SODIUM 550 MG PO TABS
550.0000 mg | ORAL_TABLET | Freq: Once | ORAL | Status: AC
  Administered 2015-07-23: 550 mg via ORAL
  Filled 2015-07-23: qty 1

## 2015-07-23 NOTE — MAU Provider Note (Signed)
History     CSN: [id number]  Arrival date and time: 07/23/15 1335   None     Chief Complaint  Patient presents with  . Abdominal Pain  . Back Pain   Abdominal Pain  Back Pain Associated symptoms include abdominal pain.    Sheila [name] 44 y.o. [id number] presents to the MAU stating that she is having pain in her lower back and in her abdomen. She describes this as a cramping sensation that is very painful when it is happening. She is "waiting" on her period to start. Her last LMP was 06/18/15.  Past Medical History  Diagnosis Date  . Anemia   . Ejection fraction     Past Surgical History  Procedure Laterality Date  . Tubal ligation    . Left heart catheterization with coronary angiogram N/A 05/30/2014    Procedure: LEFT HEART CATHETERIZATION WITH CORONARY ANGIOGRAM;  Surgeon: Sheila M Martinique, MD;  Location: [name];  Service: Cardiovascular;  Laterality: N/A;    Family History  Problem Relation Age of Onset  . Hypertension Mother   . Diabetes Father   . Hypertension Maternal Aunt   . Hypertension Maternal Uncle   . Cancer Maternal Grandmother   . Hypertension Maternal Grandmother   . Stroke Maternal Grandmother   . Stroke Maternal Grandfather     Social History  Substance Use Topics  . Smoking status: Never Smoker   . Smokeless tobacco: None  . Alcohol Use: No    Allergies: No Known Allergies  Prescriptions prior to admission  Medication Sig Dispense Refill Last Dose  . acyclovir (ZOVIRAX) 400 MG tablet Take 1 tablet (400 mg total) by mouth 3 (three) times daily. 15 tablet 3   . albuterol (PROVENTIL HFA;VENTOLIN HFA) 108 (90 BASE) MCG/ACT inhaler Inhale 2 puffs into the lungs every 6 (six) hours as needed for wheezing or shortness of breath.   prn  . aspirin 325 MG tablet Take 325 mg by mouth every 6 (six) hours as needed for mild pain.   prn  . ferrous fumarate (HEMOCYTE - 106 MG FE) 325 (106 FE) MG TABS tablet Take 1 tablet by mouth.   Past Month at  Unknown time  . fluconazole (DIFLUCAN) 150 MG tablet Take 1 tablet (150 mg total) by mouth once. Repeat in 3 days. 2 tablet 0   . metoprolol tartrate (LOPRESSOR) 12.5 mg TABS tablet Take 0.5 tablets (12.5 mg total) by mouth 2 (two) times daily. 30 tablet 6 prn  . nystatin cream (MYCOSTATIN) Apply 1 application topically 2 (two) times daily. 30 g 0   . triamcinolone cream (KENALOG) 0.1 % Apply 1 application topically 2 (two) times daily. 30 g 0   . vitamin B-12 (CYANOCOBALAMIN) 50 MCG tablet Take 50 mcg by mouth daily.   05/10/2015 at Unknown time    Review of Systems  Gastrointestinal: Positive for abdominal pain.  Musculoskeletal: Positive for back pain.  All other systems reviewed and are negative.  Physical Exam   Blood pressure 140/92, pulse 83, temperature 98.4 F (36.9 C), temperature source Oral, resp. rate 20, height 5' 7.5" (1.715 m), weight 78.382 kg (172 lb 12.8 oz), last menstrual period 06/18/2015.  Physical Exam  Nursing note and vitals reviewed. Constitutional: She is oriented to person, place, and time. She appears well-developed and well-nourished.  HENT:  Head: Normocephalic and atraumatic.  Cardiovascular: Normal rate.   Respiratory: Effort normal. No respiratory distress.  GI: Soft. She exhibits no distension and no mass. There  is no tenderness. There is no rigidity, no rebound, no guarding, no CVA tenderness, no tenderness at McBurney's point and negative Murphy's sign.  Musculoskeletal: Normal range of motion.  Neurological: She is alert and oriented to person, place, and time.  Skin: Skin is warm and dry.  Psychiatric: She has a normal mood and affect. Her behavior is normal. Judgment and thought content normal.   Results for orders placed or performed during the [name] encounter of 07/23/15 (from the past 24 hour(s))  Urinalysis, Routine w reflex microscopic (not at [facility])     Status: Abnormal   Collection Time: 07/23/15  2:00 PM  Result Value Ref Range    Color, Urine YELLOW YELLOW   APPearance CLEAR CLEAR   Specific Gravity, Urine 1.015 1.005 - 1.030   pH 6.5 5.0 - 8.0   Glucose, UA NEGATIVE NEGATIVE mg/dL   Hgb urine dipstick TRACE (A) NEGATIVE   Bilirubin Urine NEGATIVE NEGATIVE   Ketones, ur NEGATIVE NEGATIVE mg/dL   Protein, ur NEGATIVE NEGATIVE mg/dL   Urobilinogen, UA 0.2 0.0 - 1.0 mg/dL   Nitrite NEGATIVE NEGATIVE   Leukocytes, UA NEGATIVE NEGATIVE  Urine microscopic-add on     Status: None   Collection Time: 07/23/15  2:00 PM  Result Value Ref Range   Squamous Epithelial / LPF RARE RARE   WBC, UA 0-2 <3 WBC/hpf   RBC / HPF 0-2 <3 RBC/hpf   Bacteria, UA RARE RARE  Pregnancy, urine POC     Status: None   Collection Time: 07/23/15  2:06 PM  Result Value Ref Range   Preg Test, Ur NEGATIVE NEGATIVE  Wet prep, genital     Status: Abnormal   Collection Time: 07/23/15  2:30 PM  Result Value Ref Range   Yeast Wet Prep HPF POC NONE SEEN NONE SEEN   Trich, Wet Prep NONE SEEN NONE SEEN   Clue Cells Wet Prep HPF POC NONE SEEN NONE SEEN   WBC, Wet Prep HPF POC FEW (A) NONE SEEN  CBC     Status: Abnormal   Collection Time: 07/23/15  2:41 PM  Result Value Ref Range   WBC 4.1 4.0 - 10.5 K/uL   RBC 3.69 (L) 3.87 - 5.11 MIL/uL   Hemoglobin 8.3 (L) 12.0 - 15.0 g/dL   HCT 26.1 (L) 36.0 - 46.0 %   MCV 70.7 (L) 78.0 - 100.0 fL   MCH 22.5 (L) 26.0 - 34.0 pg   MCHC 31.8 30.0 - 36.0 g/dL   RDW 17.2 (H) 11.5 - 15.5 %   Platelets 306 150 - 400 K/uL   MAU Course  Procedures  MDM Blind wet prep and CBC pending. Anaprox DS. Pt has improved and will be discharged.  Assessment and Plan  Dysmenorhea Anemia  Anaprox DS RX Iron Supplement Discharge to home  [name] Sheila [name] 07/23/2015, 2:28 PM

## 2015-07-23 NOTE — Discharge Instructions (Signed)
Dysmenorrhea Dysmenorrhea is pain during a menstrual period. You will have pain in the lower belly (abdomen). The pain is caused by the tightening (contracting) of the muscles of the uterus. The pain can be minor or severe. Headache, feeling sick to your stomach (nausea), throwing up (vomiting), or low back pain may occur with this condition. HOME CARE  Only take medicine as told by your doctor.  Place a heating pad or hot water bottle on your lower back or belly. Do not sleep with a heating pad.  Exercise may help lessen the pain.  Massage the lower back or belly.  Stop smoking.  Avoid alcohol and caffeine. GET HELP IF:   Your pain does not get better with medicine.  You have pain during sex.  Your pain gets worse while taking pain medicine.  Your period bleeding is heavier than normal.  You keep feeling sick to your stomach or keep throwing up. GET HELP RIGHT AWAY IF: You pass out (faint). Document Released: 01/28/2009 Document Revised: 11/06/2013 Document Reviewed: 04/19/2013 [facility] Patient Information 2015 [place], [place]. This information is not intended to replace advice given to you by your health care provider. Make sure you discuss any questions you have with your health care provider.  Anemia, Nonspecific Anemia is a condition in which the concentration of red blood cells or hemoglobin in the blood is below normal. Hemoglobin is a substance in red blood cells that carries oxygen to the tissues of the body. Anemia results in not enough oxygen reaching these tissues.  CAUSES  Common causes of anemia include:   Excessive bleeding. Bleeding may be internal or external. This includes excessive bleeding from periods (in women) or from the intestine.   Poor nutrition.   Chronic kidney, thyroid, and liver disease.  Bone marrow disorders that decrease red blood cell production.  Cancer and treatments for cancer.  HIV, AIDS, and their treatments.  Spleen problems  that increase red blood cell destruction.  Blood disorders.  Excess destruction of red blood cells due to infection, medicines, and autoimmune disorders. SIGNS AND SYMPTOMS   Minor weakness.   Dizziness.   Headache.  Palpitations.   Shortness of breath, especially with exercise.   Paleness.  Cold sensitivity.  Indigestion.  Nausea.  Difficulty sleeping.  Difficulty concentrating. Symptoms may occur suddenly or they may develop slowly.  DIAGNOSIS  Additional blood tests are often needed. These help your health care provider determine the best treatment. Your health care provider will check your stool for blood and look for other causes of blood loss.  TREATMENT  Treatment varies depending on the cause of the anemia. Treatment can include:   Supplements of iron, vitamin Q76, or folic acid.   Hormone medicines.   A blood transfusion. This may be needed if blood loss is severe.   Hospitalization. This may be needed if there is significant continual blood loss.   Dietary changes.  Spleen removal. HOME CARE INSTRUCTIONS Keep all follow-up appointments. It often takes many weeks to correct anemia, and having your health care provider check on your condition and your response to treatment is very important. SEEK IMMEDIATE MEDICAL CARE IF:   You develop extreme weakness, shortness of breath, or chest pain.   You become dizzy or have trouble concentrating.  You develop heavy vaginal bleeding.   You develop a rash.   You have bloody or black, tarry stools.   You faint.   You vomit up blood.   You vomit repeatedly.   You  have abdominal pain.  You have a fever or persistent symptoms for more than 2-3 days.   You have a fever and your symptoms suddenly get worse.   You are dehydrated.  MAKE SURE YOU:  Understand these instructions.  Will watch your condition.  Will get help right away if you are not doing well or get worse. Document  Released: 12/09/2004 Document Revised: 07/04/2013 Document Reviewed: 04/27/2013 [facility] Patient Information 2015 [place], [place]. This information is not intended to replace advice given to you by your health care provider. Make sure you discuss any questions you have with your health care provider.

## 2015-07-23 NOTE — MAU Note (Addendum)
Been been having pains in side, low back and bottom of stomach for the past month. At times feels like she is going to pass out, is unbearable.  Been cramping for like a month, no cycle, tubes are tied.  Has not taken anything

## 2015-08-12 ENCOUNTER — Inpatient Hospital Stay (HOSPITAL_COMMUNITY)
Admission: AD | Admit: 2015-08-12 | Discharge: 2015-08-12 | Disposition: A | Discharge status: Home / Self Care | Payer: Medicaid Other | Source: Ambulatory Visit | Attending: Obstetrics | Admitting: Obstetrics

## 2015-08-12 ENCOUNTER — Encounter (HOSPITAL_COMMUNITY): Payer: Self-pay | Admitting: *Deleted

## 2015-08-12 DIAGNOSIS — R102 Pelvic and perineal pain: Secondary | ICD-10-CM | POA: Insufficient documentation

## 2015-08-12 DIAGNOSIS — O218 Other vomiting complicating pregnancy: Secondary | ICD-10-CM

## 2015-08-12 DIAGNOSIS — O9989 Other specified diseases and conditions complicating pregnancy, childbirth and the puerperium: Secondary | ICD-10-CM | POA: Diagnosis not present

## 2015-08-12 DIAGNOSIS — Z3201 Encounter for pregnancy test, result positive: Secondary | ICD-10-CM

## 2015-08-12 DIAGNOSIS — R109 Unspecified abdominal pain: Secondary | ICD-10-CM | POA: Diagnosis not present

## 2015-08-12 DIAGNOSIS — Z3202 Encounter for pregnancy test, result negative: Secondary | ICD-10-CM | POA: Insufficient documentation

## 2015-08-12 DIAGNOSIS — N911 Secondary amenorrhea: Secondary | ICD-10-CM | POA: Diagnosis not present

## 2015-08-12 DIAGNOSIS — R110 Nausea: Secondary | ICD-10-CM

## 2015-08-12 LAB — CBC
HCT: 31.8 % — ABNORMAL LOW (ref 36.0–46.0)
HEMOGLOBIN: 9.9 g/dL — AB (ref 12.0–15.0)
MCH: 23.3 pg — ABNORMAL LOW (ref 26.0–34.0)
MCHC: 31.1 g/dL (ref 30.0–36.0)
MCV: 74.8 fL — ABNORMAL LOW (ref 78.0–100.0)
Platelets: 297 10*3/uL (ref 150–400)
RBC: 4.25 MIL/uL (ref 3.87–5.11)
RDW: 21.1 % — ABNORMAL HIGH (ref 11.5–15.5)
WBC: 4.2 10*3/uL (ref 4.0–10.5)

## 2015-08-12 LAB — WET PREP, GENITAL
Trich, Wet Prep: NONE SEEN
YEAST WET PREP: NONE SEEN

## 2015-08-12 LAB — URINALYSIS, ROUTINE W REFLEX MICROSCOPIC
Bilirubin Urine: NEGATIVE
Glucose, UA: NEGATIVE mg/dL
Ketones, ur: NEGATIVE mg/dL
Nitrite: NEGATIVE
Protein, ur: NEGATIVE mg/dL
SPECIFIC GRAVITY, URINE: 1.02 (ref 1.005–1.030)
UROBILINOGEN UA: 0.2 mg/dL (ref 0.0–1.0)
pH: 6 (ref 5.0–8.0)

## 2015-08-12 LAB — HCG, QUANTITATIVE, PREGNANCY: HCG, BETA CHAIN, QUANT, S: 1 m[IU]/mL (ref <5)

## 2015-08-12 LAB — URINE MICROSCOPIC-ADD ON

## 2015-08-12 LAB — ABO/RH: ABO/RH(D): A POS

## 2015-08-12 MED ORDER — ONDANSETRON HCL 4 MG PO TABS: 4.0000 mg | ORAL_TABLET | Freq: Four times a day (QID) | ORAL | Status: DC

## 2015-08-12 NOTE — MAU Provider Note (Signed)
Chief Complaint: Abdominal Pain; Possible Pregnancy; and Nausea   None     SUBJECTIVE HPI: Sheila BISAILLON is a 44 y.o. [id number] who presents to maternity admissions reporting abdominal cramping and nausea x 1 month with late menses. She reports her menses are usually regular and LMP was 06/18/15.  She reports feeling menstrual-like cramps off and on x 1 month accompanied by nausea but no onset of menses.  She had a dental procedure today and decided to take a home UPT which was positive.  She does not desire pregnancy.   She denies vaginal bleeding, vaginal itching/burning, urinary symptoms, h/a, dizziness, vomiting, or fever/chills.     Abdominal Pain This is a new problem. The current episode started 1 to 4 weeks ago. The onset quality is gradual. The problem occurs intermittently. The problem has been waxing and waning. The pain is located in the generalized abdominal region. The pain is moderate. The quality of the pain is cramping. The abdominal pain radiates to the back. Associated symptoms include nausea. Pertinent negatives include no constipation, diarrhea, dysuria, fever, frequency, headaches or vomiting. Nothing aggravates the pain. The pain is relieved by nothing. She has tried nothing for the symptoms.  Possible Pregnancy Associated symptoms include abdominal pain and nausea. Pertinent negatives include no chest pain, chills, fatigue, fever, headaches, neck pain, vomiting or weakness.    Past Medical History  Diagnosis Date  . Anemia   . Ejection fraction    Past Surgical History  Procedure Laterality Date  . Tubal ligation    . Left heart catheterization with coronary angiogram N/A 05/30/2014    Procedure: LEFT HEART CATHETERIZATION WITH CORONARY ANGIOGRAM;  Surgeon: Peter M Martinique, MD;  Location: [facility] CATH LAB;  Service: Cardiovascular;  Laterality: N/A;   Social History   Social History  . Marital Status: Married    Spouse Name: N/A  . Number of Children: N/A  . Years of  Education: N/A   Occupational History  . Not on file.   Social History Main Topics  . Smoking status: Never Smoker   . Smokeless tobacco: Not on file  . Alcohol Use: No  . Drug Use: No  . Sexual Activity: Yes    Birth Control/ Protection: Condom, Surgical   Other Topics Concern  . Not on file   Social History Narrative   No current facility-administered medications on file prior to encounter.   Current Outpatient Prescriptions on File Prior to Encounter  Medication Sig Dispense Refill  . albuterol (PROVENTIL HFA;VENTOLIN HFA) 108 (90 BASE) MCG/ACT inhaler Inhale 2 puffs into the lungs every 6 (six) hours as needed for wheezing or shortness of breath.    . ferrous sulfate 325 (65 FE) MG EC tablet Take 1 tablet (325 mg total) by mouth 2 (two) times daily. 60 tablet 3  . naproxen sodium (ANAPROX DS) 550 MG tablet Take 1 tablet (550 mg total) by mouth 2 (two) times daily with a meal. 60 tablet 2   No Known Allergies  ROS:  Review of Systems  Constitutional: Negative for fever, chills and fatigue.  HENT: Negative for sinus pressure.   Eyes: Negative for photophobia.  Respiratory: Negative for shortness of breath.   Cardiovascular: Negative for chest pain.  Gastrointestinal: Positive for nausea and abdominal pain. Negative for vomiting, diarrhea and constipation.  Genitourinary: Negative for dysuria, frequency, flank pain, vaginal bleeding, vaginal discharge, difficulty urinating, vaginal pain and pelvic pain.  Musculoskeletal: Negative for neck pain.  Neurological: Negative for dizziness, weakness and  headaches.  Psychiatric/Behavioral: Negative.      I have reviewed patient's Past Medical Hx, Surgical Hx, Family Hx, Social Hx, medications and allergies.   Physical Exam  Patient Vitals for the past 24 hrs:  BP Temp Temp src Pulse Resp SpO2 Height Weight  08/12/15 1503 141/93 mmHg 97.6 F (36.4 C) Oral 77 18 100 % 5' 8.5" (1.74 m) 77.338 kg (170 lb 8 oz)   Constitutional:  Well-developed, well-nourished female in no acute distress.  Cardiovascular: normal rate Respiratory: normal effort GI: Abd soft, non-tender. Pos BS x 4 MS: Extremities nontender, no edema, normal ROM Neurologic: Alert and oriented x 4.  GU: Neg CVAT.  PELVIC EXAM: Cervix pink, visually closed, without lesion, scant white creamy discharge, vaginal walls and external genitalia normal Bimanual exam: Cervix 0/long/high, firm, anterior, neg CMT, uterus nontender, nonenlarged, adnexa without tenderness, enlargement, or mass   LAB RESULTS Results for orders placed or performed during the hospital encounter of 08/12/15 (from the past 24 hour(s))  Urinalysis, Routine w reflex microscopic (not at [facility])     Status: Abnormal   Collection Time: 08/12/15  3:10 PM  Result Value Ref Range   Color, Urine YELLOW YELLOW   APPearance HAZY (A) CLEAR   Specific Gravity, Urine 1.020 1.005 - 1.030   pH 6.0 5.0 - 8.0   Glucose, UA NEGATIVE NEGATIVE mg/dL   Hgb urine dipstick TRACE (A) NEGATIVE   Bilirubin Urine NEGATIVE NEGATIVE   Ketones, ur NEGATIVE NEGATIVE mg/dL   Protein, ur NEGATIVE NEGATIVE mg/dL   Urobilinogen, UA 0.2 0.0 - 1.0 mg/dL   Nitrite NEGATIVE NEGATIVE   Leukocytes, UA TRACE (A) NEGATIVE  Urine microscopic-add on     Status: Abnormal   Collection Time: 08/12/15  3:10 PM  Result Value Ref Range   Squamous Epithelial / LPF FEW (A) RARE   WBC, UA 0-2 <3 WBC/hpf   RBC / HPF 0-2 <3 RBC/hpf   Bacteria, UA RARE RARE  hCG, quantitative, pregnancy     Status: None   Collection Time: 08/12/15  3:35 PM  Result Value Ref Range   hCG, Beta Chain, Quant, S 1 <5 mIU/mL  CBC     Status: Abnormal   Collection Time: 08/12/15  3:35 PM  Result Value Ref Range   WBC 4.2 4.0 - 10.5 K/uL   RBC 4.25 3.87 - 5.11 MIL/uL   Hemoglobin 9.9 (L) 12.0 - 15.0 g/dL   HCT 31.8 (L) 36.0 - 46.0 %   MCV 74.8 (L) 78.0 - 100.0 fL   MCH 23.3 (L) 26.0 - 34.0 pg   MCHC 31.1 30.0 - 36.0 g/dL   RDW 21.1 (H) 11.5 -  15.5 %   Platelets 297 150 - 400 K/uL  ABO/Rh     Status: None (Preliminary result)   Collection Time: 08/12/15  3:35 PM  Result Value Ref Range   ABO/RH(D) A POS   Wet prep, genital     Status: Abnormal   Collection Time: 08/12/15  4:41 PM  Result Value Ref Range   Yeast Wet Prep HPF POC NONE SEEN NONE SEEN   Trich, Wet Prep NONE SEEN NONE SEEN   Clue Cells Wet Prep HPF POC FEW (A) NONE SEEN   WBC, Wet Prep HPF POC FEW (A) NONE SEEN    --/--/A POS (09/27 1535)  IMAGING No results found.  MAU Management/MDM: Ordered labs and reviewed results.  Quant hcg 1 indicates false positive pregnancy test at home today.  Pt to pick up  Naproxen as prescribed by dentist today and add Zofran PRN for nausea.  F/U outpatient for irregular menses/menstrual pain as needed.  Pt stable at time of discharge.  ASSESSMENT 1. Negative pregnancy test   2. Secondary amenorrhea   3. Pelvic pain in female   4. Nausea     PLAN Discharge home Zofran 4 mg PO Q 8 hours PRN       Follow-up Information    Schedule an appointment as soon as possible for a visit with Frederico Hamman, MD.   Specialty:  Obstetrics and Gynecology   Contact information:   [address] [place] [address] [phone]       Follow up with [facility].   Why:  As needed for emergencies   Contact information:   [address] [id number] [place] Certified Nurse-Midwife 08/12/2015  5:17 PM

## 2015-08-12 NOTE — MAU Note (Signed)
Having real bad abd pain- continues.  Still hasn't had menstrual and the nausea also continues. +HPT this morning.

## 2015-08-12 NOTE — Discharge Instructions (Signed)
Pelvic Pain °Female pelvic pain can be caused by many different things and start from a variety of places. Pelvic pain refers to pain that is located in the lower half of the abdomen and between your hips. The pain may occur over a short period of time (acute) or may be reoccurring (chronic). The cause of pelvic pain may be related to disorders affecting the female reproductive organs (gynecologic), but it may also be related to the bladder, kidney stones, an intestinal complication, or muscle or skeletal problems. Getting help right away for pelvic pain is important, especially if there has been severe, sharp, or a sudden onset of unusual pain. It is also important to get help right away because some types of pelvic pain can be life threatening.  °CAUSES  °Below are only some of the causes of pelvic pain. The causes of pelvic pain can be in one of several categories.  °· Gynecologic. °· Pelvic inflammatory disease. °· Sexually transmitted infection. °· Ovarian cyst or a twisted ovarian ligament (ovarian torsion). °· Uterine lining that grows outside the uterus (endometriosis). °· Fibroids, cysts, or tumors. °· Ovulation. °· Pregnancy. °· Pregnancy that occurs outside the uterus (ectopic pregnancy). °· Miscarriage. °· Labor. °· Abruption of the placenta or ruptured uterus. °· Infection. °· Uterine infection (endometritis). °· Bladder infection. °· Diverticulitis. °· Miscarriage related to a uterine infection (septic abortion). °· Bladder. °· Inflammation of the bladder (cystitis). °· Kidney stone(s). °· Gastrointestinal. °· Constipation. °· Diverticulitis. °· Neurologic. °· Trauma. °· Feeling pelvic pain because of mental or emotional causes (psychosomatic). °· Cancers of the bowel or pelvis. °EVALUATION  °Your caregiver will want to take a careful history of your concerns. This includes recent changes in your health, a careful gynecologic history of your periods (menses), and a sexual history. Obtaining your family  history and medical history is also important. Your caregiver may suggest a pelvic exam. A pelvic exam will help identify the location and severity of the pain. It also helps in the evaluation of which organ system may be involved. In order to identify the cause of the pelvic pain and be properly treated, your caregiver may order tests. These tests may include:  °· A pregnancy test. °· Pelvic ultrasonography. °· An X-ray exam of the abdomen. °· A urinalysis or evaluation of vaginal discharge. °· Blood tests. °HOME CARE INSTRUCTIONS  °· Only take over-the-counter or prescription medicines for pain, discomfort, or fever as directed by your caregiver.   °· Rest as directed by your caregiver.   °· Eat a balanced diet.   °· Drink enough fluids to make your urine clear or pale yellow, or as directed.   °· Avoid sexual intercourse if it causes pain.   °· Apply warm or cold compresses to the lower abdomen depending on which one helps the pain.   °· Avoid stressful situations.   °· Keep a journal of your pelvic pain. Write down when it started, where the pain is located, and if there are things that seem to be associated with the pain, such as food or your menstrual cycle. °· Follow up with your caregiver as directed.   °SEEK MEDICAL CARE IF: °· Your medicine does not help your pain. °· You have abnormal vaginal discharge. °SEEK IMMEDIATE MEDICAL CARE IF:  °· You have heavy bleeding from the vagina.   °· Your pelvic pain increases.   °· You feel light-headed or faint.   °· You have chills.   °· You have pain with urination or blood in your urine.   °· You have uncontrolled diarrhea   or vomiting.   You have a fever or persistent symptoms for more than 3 days.  You have a fever and your symptoms suddenly get worse.   You are being physically or sexually abused.  MAKE SURE YOU:  Understand these instructions.  Will watch your condition.  Will get help if you are not doing well or get worse. Document Released:  09/28/2004 Document Revised: 03/18/2014 Document Reviewed: 02/21/2012 [facility] Patient Information 2015 [place], [place]. This information is not intended to replace advice given to you by your health care provider. Make sure you discuss any questions you have with your health care provider. Nausea and Vomiting Nausea is a sick feeling that often comes before throwing up (vomiting). Vomiting is a reflex where stomach contents come out of your mouth. Vomiting can cause severe loss of body fluids (dehydration). Children and elderly adults can become dehydrated quickly, especially if they also have diarrhea. Nausea and vomiting are symptoms of a condition or disease. It is important to find the cause of your symptoms. CAUSES   Direct irritation of the stomach lining. This irritation can result from increased acid production (gastroesophageal reflux disease), infection, food poisoning, taking certain medicines (such as nonsteroidal anti-inflammatory drugs), alcohol use, or tobacco use.  Signals from the brain.These signals could be caused by a headache, heat exposure, an inner ear disturbance, increased pressure in the brain from injury, infection, a tumor, or a concussion, pain, emotional stimulus, or metabolic problems.  An obstruction in the gastrointestinal tract (bowel obstruction).  Illnesses such as diabetes, hepatitis, gallbladder problems, appendicitis, kidney problems, cancer, sepsis, atypical symptoms of a heart attack, or eating disorders.  Medical treatments such as chemotherapy and radiation.  Receiving medicine that makes you sleep (general anesthetic) during surgery. DIAGNOSIS Your caregiver may ask for tests to be done if the problems do not improve after a few days. Tests may also be done if symptoms are severe or if the reason for the nausea and vomiting is not clear. Tests may include:  Urine tests.  Blood tests.  Stool tests.  Cultures (to look for evidence of  infection).  X-rays or other imaging studies. Test results can help your caregiver make decisions about treatment or the need for additional tests. TREATMENT You need to stay well hydrated. Drink frequently but in small amounts.You may wish to drink water, sports drinks, clear broth, or eat frozen ice pops or gelatin dessert to help stay hydrated.When you eat, eating slowly may help prevent nausea.There are also some antinausea medicines that may help prevent nausea. HOME CARE INSTRUCTIONS   Take all medicine as directed by your caregiver.  If you do not have an appetite, do not force yourself to eat. However, you must continue to drink fluids.  If you have an appetite, eat a normal diet unless your caregiver tells you differently.  Eat a variety of complex carbohydrates (rice, wheat, potatoes, bread), lean meats, yogurt, fruits, and vegetables.  Avoid high-fat foods because they are more difficult to digest.  Drink enough water and fluids to keep your urine clear or pale yellow.  If you are dehydrated, ask your caregiver for specific rehydration instructions. Signs of dehydration may include:  Severe thirst.  Dry lips and mouth.  Dizziness.  Dark urine.  Decreasing urine frequency and amount.  Confusion.  Rapid breathing or pulse. SEEK IMMEDIATE MEDICAL CARE IF:   You have blood or brown flecks (like coffee grounds) in your vomit.  You have black or bloody stools.  You have  a severe headache or stiff neck.  You are confused.  You have severe abdominal pain.  You have chest pain or trouble breathing.  You do not urinate at least once every 8 hours.  You develop cold or clammy skin.  You continue to vomit for longer than 24 to 48 hours.  You have a fever. MAKE SURE YOU:   Understand these instructions.  Will watch your condition.  Will get help right away if you are not doing well or get worse. Document Released: 11/01/2005 Document Revised: 01/24/2012  Document Reviewed: 03/31/2011 [facility] Patient Information 2015 [place], [place]. This information is not intended to replace advice given to you by your health care provider. Make sure you discuss any questions you have with your health care provider.

## 2015-08-13 LAB — GC/CHLAMYDIA PROBE AMP (~~LOC~~) NOT AT ARMC
Chlamydia: NEGATIVE
Neisseria Gonorrhea: NEGATIVE

## 2015-12-03 ENCOUNTER — Emergency Department (HOSPITAL_COMMUNITY): Payer: Medicaid Other

## 2015-12-03 ENCOUNTER — Emergency Department (HOSPITAL_COMMUNITY)
Admission: EM | Admit: 2015-12-03 | Discharge: 2015-12-03 | Disposition: A | Discharge status: Home / Self Care | Payer: Medicaid Other | Attending: Emergency Medicine | Admitting: Emergency Medicine

## 2015-12-03 ENCOUNTER — Encounter (HOSPITAL_COMMUNITY): Payer: Self-pay | Admitting: Emergency Medicine

## 2015-12-03 DIAGNOSIS — R1013 Epigastric pain: Secondary | ICD-10-CM | POA: Insufficient documentation

## 2015-12-03 DIAGNOSIS — Z3202 Encounter for pregnancy test, result negative: Secondary | ICD-10-CM | POA: Diagnosis not present

## 2015-12-03 DIAGNOSIS — R0789 Other chest pain: Secondary | ICD-10-CM | POA: Insufficient documentation

## 2015-12-03 DIAGNOSIS — R42 Dizziness and giddiness: Secondary | ICD-10-CM | POA: Diagnosis not present

## 2015-12-03 DIAGNOSIS — Z79899 Other long term (current) drug therapy: Secondary | ICD-10-CM | POA: Insufficient documentation

## 2015-12-03 DIAGNOSIS — M545 Low back pain: Secondary | ICD-10-CM | POA: Diagnosis not present

## 2015-12-03 DIAGNOSIS — Z8659 Personal history of other mental and behavioral disorders: Secondary | ICD-10-CM | POA: Insufficient documentation

## 2015-12-03 DIAGNOSIS — D649 Anemia, unspecified: Secondary | ICD-10-CM | POA: Diagnosis not present

## 2015-12-03 DIAGNOSIS — R630 Anorexia: Secondary | ICD-10-CM | POA: Diagnosis not present

## 2015-12-03 DIAGNOSIS — R110 Nausea: Secondary | ICD-10-CM | POA: Diagnosis not present

## 2015-12-03 DIAGNOSIS — R079 Chest pain, unspecified: Secondary | ICD-10-CM

## 2015-12-03 LAB — URINALYSIS, ROUTINE W REFLEX MICROSCOPIC
Bilirubin Urine: NEGATIVE
Glucose, UA: 100 mg/dL — AB
HGB URINE DIPSTICK: NEGATIVE
Ketones, ur: NEGATIVE mg/dL
LEUKOCYTES UA: NEGATIVE
Nitrite: NEGATIVE
PROTEIN: 30 mg/dL — AB
SPECIFIC GRAVITY, URINE: 1.013 (ref 1.005–1.030)
pH: 6 (ref 5.0–8.0)

## 2015-12-03 LAB — URINE MICROSCOPIC-ADD ON: RBC / HPF: NONE SEEN RBC/hpf (ref 0–5)

## 2015-12-03 LAB — COMPREHENSIVE METABOLIC PANEL
ALBUMIN: 3.6 g/dL (ref 3.5–5.0)
ALK PHOS: 51 U/L (ref 38–126)
ALT: 12 U/L — ABNORMAL LOW (ref 14–54)
AST: 18 U/L (ref 15–41)
Anion gap: 8 (ref 5–15)
BILIRUBIN TOTAL: 1.1 mg/dL (ref 0.3–1.2)
BUN: 6 mg/dL (ref 6–20)
CO2: 22 mmol/L (ref 22–32)
CREATININE: 0.9 mg/dL (ref 0.44–1.00)
Calcium: 9.1 mg/dL (ref 8.9–10.3)
Chloride: 108 mmol/L (ref 101–111)
GFR calc Af Amer: >60 mL/min (ref >60)
Glucose, Bld: 102 mg/dL — ABNORMAL HIGH (ref 65–99)
POTASSIUM: 3.9 mmol/L (ref 3.5–5.1)
Sodium: 138 mmol/L (ref 135–145)
Total Protein: 6.9 g/dL (ref 6.5–8.1)

## 2015-12-03 LAB — I-STAT TROPONIN, ED
Troponin i, poc: 0.01 ng/mL (ref 0.00–0.08)
Troponin i, poc: 0 ng/mL (ref 0.00–0.08)

## 2015-12-03 LAB — CBC
HEMATOCRIT: 35.8 % — AB (ref 36.0–46.0)
HEMOGLOBIN: 11.9 g/dL — AB (ref 12.0–15.0)
MCH: 27.9 pg (ref 26.0–34.0)
MCHC: 33.2 g/dL (ref 30.0–36.0)
MCV: 83.8 fL (ref 78.0–100.0)
Platelets: 261 10*3/uL (ref 150–400)
RBC: 4.27 MIL/uL (ref 3.87–5.11)
RDW: 14.8 % (ref 11.5–15.5)
WBC: 5.2 10*3/uL (ref 4.0–10.5)

## 2015-12-03 LAB — LIPASE, BLOOD: Lipase: 25 U/L (ref 11–51)

## 2015-12-03 LAB — I-STAT BETA HCG BLOOD, ED (MC, WL, AP ONLY)

## 2015-12-03 MED ORDER — SODIUM CHLORIDE 0.9 % IV BOLUS (SEPSIS): 500.0000 mL | Freq: Once | INTRAVENOUS | Status: DC

## 2015-12-03 MED ORDER — ACETAMINOPHEN 325 MG PO TABS
650.0000 mg | ORAL_TABLET | Freq: Once | ORAL | Status: AC
  Administered 2015-12-03: 650 mg via ORAL
  Filled 2015-12-03: qty 2

## 2015-12-03 NOTE — ED Notes (Addendum)
Pt from work/school via [facility] with c/o dizziness and nausea.  Pt then laid on the bathroom floor after having a BM.  Additionally reports epigastric pain/CP intermittent x years which has resolved and lower back pain x 3-4 days with nausea.  Pt ambulatory from stretcher to ED room.  Back pain is more significant than any other location.  NAD, A&O.

## 2015-12-03 NOTE — ED Notes (Addendum)
Pt reports nausea has been x 3-4 days when her back pain started.  Denies urinary symptoms.  Hx of kidney stones.

## 2015-12-03 NOTE — ED Provider Notes (Signed)
CSN: [id number]     Arrival date & time 12/03/15  1217 History   First MD Initiated Contact with Patient 12/03/15 1223     Chief Complaint  Patient presents with  . Dizziness  . Nausea  . Abdominal Pain  . Back Pain     (Consider location/radiation/quality/duration/timing/severity/associated sxs/prior Treatment) HPI Comments: 54 rolled female with anemia, panic attack history presents with back pain. Patient is had intermittent low back pain for the past 3-4 days, denies cancer history, IV drug abuse, weakness or numbness in her legs, bowel or bladder changes, worse with palpation and movement. Denies urinary symptoms. Today patient had episode of lightheadedness and nausea no syncope, no blood in the stools. Patient has improved since then. Patient had an episode of epigastric lower chest discomfort intermittent for the past few years no cardiac history. No recent surgeries or blood clot history  Patient is a 45 y.o. female presenting with dizziness, abdominal pain, and back pain. The history is provided by the patient.  Dizziness Associated symptoms: chest pain   Associated symptoms: no headaches, no shortness of breath and no vomiting   Abdominal Pain Associated symptoms: chest pain   Associated symptoms: no chills, no dysuria, no fever, no shortness of breath and no vomiting   Back Pain Associated symptoms: abdominal pain and chest pain   Associated symptoms: no dysuria, no fever and no headaches     Past Medical History  Diagnosis Date  . Anemia   . Ejection fraction    Past Surgical History  Procedure Laterality Date  . Tubal ligation    . Left heart catheterization with coronary angiogram N/A 05/30/2014    Procedure: LEFT HEART CATHETERIZATION WITH CORONARY ANGIOGRAM;  Surgeon: Peter M Martinique, MD;  Location: [facility] CATH LAB;  Service: Cardiovascular;  Laterality: N/A;   Family History  Problem Relation Age of Onset  . Hypertension Mother   . Diabetes Father   .  Hypertension Maternal Aunt   . Hypertension Maternal Uncle   . Cancer Maternal Grandmother   . Hypertension Maternal Grandmother   . Stroke Maternal Grandmother   . Stroke Maternal Grandfather    Social History  Substance Use Topics  . Smoking status: Never Smoker   . Smokeless tobacco: None  . Alcohol Use: No   OB History    Gravida Para Term Preterm AB TAB SAB Ectopic Multiple Living   8 6 4 2 2  2   4      Review of Systems  Constitutional: Positive for appetite change. Negative for fever and chills.  HENT: Negative for congestion.   Eyes: Negative for visual disturbance.  Respiratory: Negative for shortness of breath.   Cardiovascular: Positive for chest pain.  Gastrointestinal: Positive for abdominal pain. Negative for vomiting.  Genitourinary: Negative for dysuria and flank pain.  Musculoskeletal: Positive for back pain and arthralgias. Negative for neck pain and neck stiffness.  Skin: Negative for rash.  Neurological: Positive for dizziness and light-headedness. Negative for headaches.      Allergies  Review of patient's allergies indicates no known allergies.  Home Medications   Prior to Admission medications   Medication Sig Start Date End Date Taking? Authorizing Provider  albuterol (PROVENTIL HFA;VENTOLIN HFA) 108 (90 BASE) MCG/ACT inhaler Inhale 2 puffs into the lungs every 6 (six) hours as needed for wheezing or shortness of breath.   Yes Historical Provider, MD  ferrous sulfate 325 (65 FE) MG EC tablet Take 1 tablet (325 mg total) by mouth 2 (two)  times daily. Patient not taking: Reported on 12/03/2015 07/23/15 07/22/16  Cecille Rubin A Clemmons, CNM  naproxen sodium (ANAPROX DS) 550 MG tablet Take 1 tablet (550 mg total) by mouth 2 (two) times daily with a meal. Patient not taking: Reported on 12/03/2015 07/23/15   Cecille Rubin A Clemmons, CNM  ondansetron (ZOFRAN) 4 MG tablet Take 1 tablet (4 mg total) by mouth every 6 (six) hours. Patient not taking: Reported on 12/03/2015 08/12/15    Kathie Dike Leftwich-Kirby, CNM   BP 115/87 mmHg  Pulse 82  Temp(Src) 98.3 F (36.8 C) (Oral)  Resp 14  SpO2 98%  LMP 10/03/2015 (Approximate) Physical Exam  Constitutional: She is oriented to person, place, and time. She appears well-developed and well-nourished.  HENT:  Head: Normocephalic and atraumatic.  Eyes: Conjunctivae are normal. Right eye exhibits no discharge. Left eye exhibits no discharge.  Neck: Normal range of motion. Neck supple. No tracheal deviation present.  Cardiovascular: Normal rate, regular rhythm and intact distal pulses.   No murmur heard. Pulmonary/Chest: Effort normal and breath sounds normal.  Abdominal: Soft. She exhibits no distension. There is no tenderness. There is no guarding.  Musculoskeletal: She exhibits tenderness. She exhibits no edema.  Patient has mild tenderness proximal lumbar paraspinal and midline.  Neurological: She is alert and oriented to person, place, and time. No cranial nerve deficit. GCS eye subscore is 4. GCS verbal subscore is 5. GCS motor subscore is 6.  Reflex Scores:      Patellar reflexes are 2+ on the right side and 2+ on the left side.      Achilles reflexes are 2+ on the right side and 2+ on the left side. 5+ strength in UE and LE with f/e at major joints. Sensation to palpation intact in UE and LE. CNs 2-12 grossly intact.  EOMFI.  PERRL.   Finger nose and coordination intact bilateral.   Visual fields intact to finger testing. No nystagmus   Skin: Skin is warm. No rash noted.  Psychiatric: She has a normal mood and affect.  Nursing note and vitals reviewed.   ED Course  Procedures (including critical care time) Labs Review Labs Reviewed  COMPREHENSIVE METABOLIC PANEL - Abnormal; Notable for the following:    Glucose, Bld 102 (*)    ALT 12 (*)    All other components within normal limits  CBC - Abnormal; Notable for the following:    Hemoglobin 11.9 (*)    HCT 35.8 (*)    All other components within normal  limits  URINALYSIS, ROUTINE W REFLEX MICROSCOPIC (NOT AT [facility]) - Abnormal; Notable for the following:    Glucose, UA 100 (*)    Protein, ur 30 (*)    All other components within normal limits  URINE MICROSCOPIC-ADD ON - Abnormal; Notable for the following:    Squamous Epithelial / LPF 6-30 (*)    Bacteria, UA FEW (*)    Casts GRANULAR CAST (*)    Crystals CA OXALATE CRYSTALS (*)    All other components within normal limits  LIPASE, BLOOD  I-STAT BETA HCG BLOOD, ED ([facility], [facility], [facility] ONLY)  I-STAT TROPOININ, ED  Randolm Idol, ED    Imaging Review Dg Chest 2 View  12/03/2015  CLINICAL DATA:  Left-sided chest pain for 3 or 4 days.  Smoker. EXAM: CHEST  2 VIEW COMPARISON:  Radiographs 11/12/2014.  CT 07/23/2014. FINDINGS: The heart size and mediastinal contours are normal. The lungs are clear. There is no pleural effusion or pneumothorax. No acute  osseous findings are identified. IMPRESSION: Stable chest.  No active cardiopulmonary process. Electronically Signed   By: Richardean Sale M.D.   On: 12/03/2015 13:33   I have personally reviewed and evaluated these images and lab results as part of my medical decision-making.   EKG Interpretation   Date/Time:  Wednesday December 03 2015 12:32:11 EST Ventricular Rate:  76 PR Interval:  107 QRS Duration: 70 QT Interval:  367 QTC Calculation: 413 R Axis:   31 Text Interpretation:  Sinus rhythm Short PR interval Abnormal R-wave  progression, early transition Nonspecific T abnormalities, lateral leads  Confirmed by Addis Bennie  MD, Gerlean Cid ([id number]) on 12/03/2015 4:03:57 PM     EKG reviewed heart rate 76, sinus, no acute ST elevation nonspecific ST findings, normal QT. MDM   Final diagnoses:  Lightheaded  Chest pain, unspecified chest pain type   Patient presents with multiple different complaints primarily lower back pain without injury. Patient has normal neurologic exam. Discussed differential diagnosis including urine, musculoskeletal. Patient  had atypical brief intermittent chest discomfort in the past few weeks. Exertional component. Patient low risk cardiac plan for delta troponin. Vitals normal. On reassessment patient has no symptoms. Patient is low risk cardiac. Patient comfortable close outpatient follow-up and strict reasons return. Patient care signed out to follow-up second troponin if negative patient will be discharge. PO given Results and differential diagnosis were discussed with the patient/parent/guardian. Xrays were independently reviewed by myself.  Close follow up outpatient was discussed, comfortable with the plan.   Medications  acetaminophen (TYLENOL) tablet 650 mg (650 mg Oral Given 12/03/15 1253)    Filed Vitals:   12/03/15 1438 12/03/15 1500 12/03/15 1530 12/03/15 1600  BP: 106/71 110/67 119/71 115/87  Pulse: 80 83 87 82  Temp:      TempSrc:      Resp:      SpO2: 99% 98% 98% 98%    Final diagnoses:  Lightheaded  Chest pain, unspecified chest pain type       Elnora Morrison, MD 12/03/15 [phone]

## 2015-12-03 NOTE — ED Notes (Signed)
Patient transported to X-ray 

## 2015-12-03 NOTE — Discharge Instructions (Signed)
If you were given medicines take as directed.  If you are on coumadin or contraceptives realize their levels and effectiveness is altered by many different medicines.  If you have any reaction (rash, tongues swelling, other) to the medicines stop taking and see a physician.    If your blood pressure was elevated in the ER make sure you follow up for management with a primary doctor or return for chest pain, shortness of breath or stroke symptoms.  Please follow up as directed and return to the ER or see a physician for new or worsening symptoms.  Thank you. Filed Vitals:   12/03/15 1240 12/03/15 1300 12/03/15 1438 12/03/15 1500  BP:  118/79 106/71 110/67  Pulse:  75 80 83  Temp: 98.3 F (36.8 C)     TempSrc: Oral     Resp:  14    SpO2:  98% 99% 98%

## 2015-12-03 NOTE — ED Provider Notes (Signed)
A she reports she became lightheaded with standing and sweaty and with left-sided chest pain at 11:30 a.m. today. Symptoms lasted for 25 minutes resolved spontaneously. She is presently asymptomatic. Patient had negative cardiac catheterization July 2015. On exam alert no distress lungs clear to auscultation heart regular rate and rhythm no murmurs rubs abdomen nontender. Extremities without edema skin warm dry neurologic Glasgow Coma Score 15 she is not lightheaded on standing. Results for orders placed or performed during the hospital encounter of 12/03/15  Lipase, blood  Result Value Ref Range   Lipase 25 11 - 51 U/L  Comprehensive metabolic panel  Result Value Ref Range   Sodium 138 135 - 145 mmol/L   Potassium 3.9 3.5 - 5.1 mmol/L   Chloride 108 101 - 111 mmol/L   CO2 22 22 - 32 mmol/L   Glucose, Bld 102 (H) 65 - 99 mg/dL   BUN 6 6 - 20 mg/dL   Creatinine, Ser 0.90 0.44 - 1.00 mg/dL   Calcium 9.1 8.9 - 10.3 mg/dL   Total Protein 6.9 6.5 - 8.1 g/dL   Albumin 3.6 3.5 - 5.0 g/dL   AST 18 15 - 41 U/L   ALT 12 (L) 14 - 54 U/L   Alkaline Phosphatase 51 38 - 126 U/L   Total Bilirubin 1.1 0.3 - 1.2 mg/dL   GFR calc non Af Amer >60 >60 mL/min   GFR calc Af Amer >60 >60 mL/min   Anion gap 8 5 - 15  CBC  Result Value Ref Range   WBC 5.2 4.0 - 10.5 K/uL   RBC 4.27 3.87 - 5.11 MIL/uL   Hemoglobin 11.9 (L) 12.0 - 15.0 g/dL   HCT 35.8 (L) 36.0 - 46.0 %   MCV 83.8 78.0 - 100.0 fL   MCH 27.9 26.0 - 34.0 pg   MCHC 33.2 30.0 - 36.0 g/dL   RDW 14.8 11.5 - 15.5 %   Platelets 261 150 - 400 K/uL  Urinalysis, Routine w reflex microscopic (not at [facility])  Result Value Ref Range   Color, Urine YELLOW YELLOW   APPearance CLEAR CLEAR   Specific Gravity, Urine 1.013 1.005 - 1.030   pH 6.0 5.0 - 8.0   Glucose, UA 100 (A) NEGATIVE mg/dL   Hgb urine dipstick NEGATIVE NEGATIVE   Bilirubin Urine NEGATIVE NEGATIVE   Ketones, ur NEGATIVE NEGATIVE mg/dL   Protein, ur 30 (A) NEGATIVE mg/dL   Nitrite  NEGATIVE NEGATIVE   Leukocytes, UA NEGATIVE NEGATIVE  Urine microscopic-add on  Result Value Ref Range   Squamous Epithelial / LPF 6-30 (A) NONE SEEN   WBC, UA 0-5 0 - 5 WBC/hpf   RBC / HPF NONE SEEN 0 - 5 RBC/hpf   Bacteria, UA FEW (A) NONE SEEN   Casts GRANULAR CAST (A) NEGATIVE   Crystals CA OXALATE CRYSTALS (A) NEGATIVE  I-Stat beta hCG blood, ED ([facility], [facility], [facility] only)  Result Value Ref Range   I-stat hCG, quantitative <5.0 <5 mIU/mL   Comment 3          I-stat troponin, ED (not at [facility], [facility])  Result Value Ref Range   Troponin i, poc 0.01 0.00 - 0.08 ng/mL   Comment 3          I-stat troponin, ED  Result Value Ref Range   Troponin i, poc 0.00 0.00 - 0.08 ng/mL   Comment 3           Dg Chest 2 View  12/03/2015  CLINICAL DATA:  Left-sided chest pain for 3 or 4 days.  Smoker. EXAM: CHEST  2 VIEW COMPARISON:  Radiographs 11/12/2014.  CT 07/23/2014. FINDINGS: The heart size and mediastinal contours are normal. The lungs are clear. There is no pleural effusion or pneumothorax. No acute osseous findings are identified. IMPRESSION: Stable chest.  No active cardiopulmonary process. Electronically Signed   By: Richardean Sale M.D.   On: 12/03/2015 13:33   Pt stable for  Discharge. She states she will f/u with [facility] as out pt  Orlie Dakin, MD 12/03/15 1637

## 2016-05-16 ENCOUNTER — Emergency Department (HOSPITAL_COMMUNITY)
Admission: EM | Admit: 2016-05-16 | Discharge: 2016-05-16 | Disposition: A | Discharge status: Home / Self Care | Payer: Medicaid Other | Attending: Emergency Medicine | Admitting: Emergency Medicine

## 2016-05-16 ENCOUNTER — Encounter (HOSPITAL_COMMUNITY): Payer: Self-pay | Admitting: Emergency Medicine

## 2016-05-16 DIAGNOSIS — G5601 Carpal tunnel syndrome, right upper limb: Secondary | ICD-10-CM | POA: Diagnosis not present

## 2016-05-16 DIAGNOSIS — M79641 Pain in right hand: Secondary | ICD-10-CM | POA: Diagnosis present

## 2016-05-16 MED ORDER — HYDROCODONE-ACETAMINOPHEN 5-325 MG PO TABS: 2.0000 | ORAL_TABLET | ORAL | Status: DC | PRN

## 2016-05-16 MED ORDER — OXYCODONE-ACETAMINOPHEN 5-325 MG PO TABS
1.0000 | ORAL_TABLET | Freq: Once | ORAL | Status: AC
Start: 2016-05-16 — End: 2016-05-16
  Administered 2016-05-16: 1 via ORAL

## 2016-05-16 MED ORDER — PREDNISONE 20 MG PO TABS
60.0000 mg | ORAL_TABLET | Freq: Once | ORAL | Status: AC
  Administered 2016-05-16: 60 mg via ORAL
  Filled 2016-05-16: qty 3

## 2016-05-16 MED ORDER — METHYLPREDNISOLONE 4 MG PO TBPK: ORAL_TABLET | ORAL | Status: DC

## 2016-05-16 MED ORDER — OXYCODONE-ACETAMINOPHEN 5-325 MG PO TABS
ORAL_TABLET | ORAL | Status: AC
  Filled 2016-05-16: qty 1

## 2016-05-16 NOTE — ED Notes (Signed)
Pt. reports left knee pain , right hand and right arm pain onset today , denies injury . Respirations unlabored / denies fever or chills.

## 2016-05-16 NOTE — ED Provider Notes (Signed)
CSN: [id number]     Arrival date & time 05/16/16  0101 History   By signing my name below, I, Altamease Oiler, attest that this documentation has been prepared under the direction and in the presence of Tanna Furry, MD. Electronically Signed: Altamease Oiler, ED Scribe. 05/16/2016. 3:29 AM    Chief Complaint  Patient presents with  . Knee Pain  . Hand Pain  . Arm Pain   The history is provided by the patient. No language interpreter was used.   Sheila [name] is a 45 y.o. female who presents to the Emergency Department complaining of new, constant, 8/10 in severity, aching, diffuse, right hand and arm pain with sudden onset 2 hours PTA while sitting on the couch. The pain started in the hand and has increased since initial onset. 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 She denies any history of HTN and takes no daily medication.   Past Medical History  Diagnosis Date  . Anemia   . Ejection fraction    Past Surgical History  Procedure Laterality Date  . Tubal ligation    . Left heart catheterization with coronary angiogram N/A 05/30/2014    Procedure: LEFT HEART CATHETERIZATION WITH CORONARY ANGIOGRAM;  Surgeon: Peter M Martinique, MD;  Location: [facility] CATH LAB;  Service: Cardiovascular;  Laterality: N/A;   Family History  Problem Relation Age of Onset  . Hypertension Mother   . Diabetes Father   . Hypertension Maternal Aunt   . Hypertension Maternal Uncle   . Cancer Maternal Grandmother   . Hypertension Maternal Grandmother   . Stroke Maternal Grandmother   . Stroke Maternal Grandfather    Social History  Substance Use Topics  . Smoking status: Never Smoker   . Smokeless tobacco: None  . Alcohol Use: No   OB History    Gravida Para Term Preterm AB TAB SAB Ectopic Multiple Living   8 6 4 2 2  2   4      Review of Systems  Constitutional: Negative for fever, chills, diaphoresis, appetite change and fatigue.  HENT: Negative for mouth sores, sore  throat and trouble swallowing.   Eyes: Negative for visual disturbance.  Respiratory: Negative for cough, chest tightness, shortness of breath and wheezing.   Cardiovascular: Negative for chest pain.  Gastrointestinal: Negative for nausea, vomiting, abdominal pain, diarrhea and abdominal distention.  Endocrine: Negative for polydipsia, polyphagia and polyuria.  Genitourinary: Negative for dysuria, frequency and hematuria.  Musculoskeletal: Negative for gait problem.  Skin: Negative for color change, pallor and rash.  Neurological: Negative for dizziness, syncope, light-headedness and headaches.  Hematological: Does not bruise/bleed easily.  Psychiatric/Behavioral: Negative for behavioral problems and confusion.   Allergies  Review of patient's allergies indicates no known allergies.  Home Medications   Prior to Admission medications   Medication Sig Start Date End Date Taking? Authorizing Provider  albuterol (PROVENTIL HFA;VENTOLIN HFA) 108 (90 BASE) MCG/ACT inhaler Inhale 2 puffs into the lungs every 6 (six) hours as needed for wheezing or shortness of breath.    Historical Provider, MD  ferrous sulfate 325 (65 FE) MG EC tablet Take 1 tablet (325 mg total) by mouth 2 (two) times daily. Patient not taking: Reported on 12/03/2015 07/23/15 07/22/16  Cecille Rubin A Clemmons, CNM  naproxen sodium (ANAPROX DS) 550 MG tablet Take 1 tablet (550 mg total) by mouth 2 (two) times daily with a meal. Patient not taking: Reported on 12/03/2015 07/23/15   Bertram Gala Clemmons,  CNM  ondansetron (ZOFRAN) 4 MG tablet Take 1 tablet (4 mg total) by mouth every 6 (six) hours. Patient not taking: Reported on 12/03/2015 08/12/15   Lattie Haw A Leftwich-Kirby, CNM   BP 157/105 mmHg  Pulse 88  Temp(Src) 97.3 F (36.3 C) (Oral)  Resp 18  SpO2 100% Physical Exam  Constitutional: She is oriented to person, place, and time. She appears well-developed and well-nourished. No distress.  HENT:  Head: Normocephalic.  Eyes: Conjunctivae are  normal. Pupils are equal, round, and reactive to light. No scleral icterus.  Neck: Normal range of motion. Neck supple. No thyromegaly present.  Cardiovascular: Normal rate and regular rhythm.  Exam reveals no gallop and no friction rub.   No murmur heard. Pulmonary/Chest: Effort normal and breath sounds normal. No respiratory distress. She has no wheezes. She has no rales.  Abdominal: Soft. Bowel sounds are normal. She exhibits no distension. There is no tenderness. There is no rebound.  Musculoskeletal: Normal range of motion.  Normal neuro and vascular exam Pain radiating primarily volar from wrist and distally   Neurological: She is alert and oriented to person, place, and time.  Skin: Skin is warm and dry. No rash noted.  Psychiatric: She has a normal mood and affect. Her behavior is normal.    ED Course  Procedures (including critical care time) DIAGNOSTIC STUDIES: Oxygen Saturation is 100% on RA,  normal by my interpretation.    COORDINATION OF CARE: 3:26 AM Discussed treatment plan which includes pain medication, antiinflammatory medication, and splinting with pt at bedside and pt agreed to plan.  Labs Review Labs Reviewed - No data to display  Imaging Review No results found.   EKG Interpretation None      MDM   Final diagnoses:  Carpal tunnel syndrome of right wrist   I personally performed the services described in this documentation, which was scribed in my presence. The recorded information has been reviewed and is accurate.    Tanna Furry, MD 05/16/16 [phone]

## 2016-05-16 NOTE — Discharge Instructions (Signed)
Avoid repetitive motion. Wear splints at night, and whenever possible.   Carpal Tunnel Syndrome Carpal tunnel syndrome is a condition that causes pain in your hand and arm. The carpal tunnel is a narrow area located on the palm side of your wrist. Repeated wrist motion or certain diseases may cause swelling within the tunnel. This swelling pinches the main nerve in the wrist (median nerve). CAUSES  This condition may be caused by:   Repeated wrist motions.  Wrist injuries.  Arthritis.  A cyst or tumor in the carpal tunnel.  Fluid buildup during pregnancy. Sometimes the cause of this condition is not known.  RISK FACTORS This condition is more likely to develop in:   People who have jobs that cause them to repeatedly move their wrists in the same motion, such as butchers and cashiers.  Women.  People with certain conditions, such as:  Diabetes.  Obesity.  An underactive thyroid (hypothyroidism).  Kidney failure. SYMPTOMS  Symptoms of this condition include:   A tingling feeling in your fingers, especially in your thumb, index, and middle fingers.  Tingling or numbness in your hand.  An aching feeling in your entire arm, especially when your wrist and elbow are bent for long periods of time.  Wrist pain that goes up your arm to your shoulder.  Pain that goes down into your palm or fingers.  A weak feeling in your hands. You may have trouble grabbing and holding items. Your symptoms may feel worse during the night.  DIAGNOSIS  This condition is diagnosed with a medical history and physical exam. You may also have tests, including:   An electromyogram (EMG). This test measures electrical signals sent by your nerves into the muscles.  X-rays. TREATMENT  Treatment for this condition includes:  Lifestyle changes. It is important to stop doing or modify the activity that caused your condition.  Physical or occupational therapy.  Medicines for pain and  inflammation. This may include medicine that is injected into your wrist.  A wrist splint.  Surgery. HOME CARE INSTRUCTIONS  If You Have a Splint:  Wear it as told by your health care provider. Remove it only as told by your health care provider.  Loosen the splint if your fingers become numb and tingle, or if they turn cold and blue.  Keep the splint clean and dry. General Instructions  Take over-the-counter and prescription medicines only as told by your health care provider.  Rest your wrist from any activity that may be causing your pain. If your condition is work related, talk to your employer about changes that can be made, such as getting a wrist pad to use while typing.  If directed, apply ice to the painful area:  Put ice in a plastic bag.  Place a towel between your skin and the bag.  Leave the ice on for 20 minutes, 2-3 times per day.  Keep all follow-up visits as told by your health care provider. This is important.  Do any exercises as told by your health care provider, physical therapist, or occupational therapist. [place] IF:   You have new symptoms.  Your pain is not controlled with medicines.  Your symptoms get worse.   This information is not intended to replace advice given to you by your health care provider. Make sure you discuss any questions you have with your health care provider.   Document Released: 10/29/2000 Document Revised: 07/23/2015 Document Reviewed: 03/19/2015 Elsevier Interactive Patient Education [employer].

## 2016-07-24 LAB — CYTOLOGY - PAP

## 2016-07-29 ENCOUNTER — Inpatient Hospital Stay (HOSPITAL_COMMUNITY)
Admission: AD | Admit: 2016-07-29 | Discharge: 2016-07-29 | Disposition: A | Discharge status: Home / Self Care | Payer: Medicaid Other | Source: Ambulatory Visit | Attending: Obstetrics & Gynecology | Admitting: Obstetrics & Gynecology

## 2016-07-29 ENCOUNTER — Encounter (HOSPITAL_COMMUNITY): Payer: Self-pay

## 2016-07-29 ENCOUNTER — Inpatient Hospital Stay (HOSPITAL_COMMUNITY): Payer: Medicaid Other

## 2016-07-29 DIAGNOSIS — R109 Unspecified abdominal pain: Secondary | ICD-10-CM | POA: Diagnosis present

## 2016-07-29 DIAGNOSIS — N760 Acute vaginitis: Secondary | ICD-10-CM | POA: Diagnosis not present

## 2016-07-29 DIAGNOSIS — D649 Anemia, unspecified: Secondary | ICD-10-CM | POA: Insufficient documentation

## 2016-07-29 DIAGNOSIS — A499 Bacterial infection, unspecified: Secondary | ICD-10-CM

## 2016-07-29 DIAGNOSIS — R102 Pelvic and perineal pain: Secondary | ICD-10-CM

## 2016-07-29 DIAGNOSIS — Z3202 Encounter for pregnancy test, result negative: Secondary | ICD-10-CM | POA: Diagnosis not present

## 2016-07-29 DIAGNOSIS — N951 Menopausal and female climacteric states: Secondary | ICD-10-CM

## 2016-07-29 DIAGNOSIS — B9689 Other specified bacterial agents as the cause of diseases classified elsewhere: Secondary | ICD-10-CM

## 2016-07-29 LAB — URINALYSIS, ROUTINE W REFLEX MICROSCOPIC
Bilirubin Urine: NEGATIVE
Glucose, UA: NEGATIVE mg/dL
Ketones, ur: NEGATIVE mg/dL
LEUKOCYTES UA: NEGATIVE
Nitrite: NEGATIVE
Protein, ur: NEGATIVE mg/dL
SPECIFIC GRAVITY, URINE: 1.015 (ref 1.005–1.030)
pH: 6 (ref 5.0–8.0)

## 2016-07-29 LAB — WET PREP, GENITAL
SPERM: NONE SEEN
TRICH WET PREP: NONE SEEN
WBC, Wet Prep HPF POC: NONE SEEN
Yeast Wet Prep HPF POC: NONE SEEN

## 2016-07-29 LAB — CBC
HEMATOCRIT: 35.1 % — AB (ref 36.0–46.0)
HEMOGLOBIN: 11.7 g/dL — AB (ref 12.0–15.0)
MCH: 27.4 pg (ref 26.0–34.0)
MCHC: 33.3 g/dL (ref 30.0–36.0)
MCV: 82.2 fL (ref 78.0–100.0)
Platelets: 254 10*3/uL (ref 150–400)
RBC: 4.27 MIL/uL (ref 3.87–5.11)
RDW: 14.3 % (ref 11.5–15.5)
WBC: 5 10*3/uL (ref 4.0–10.5)

## 2016-07-29 LAB — URINE MICROSCOPIC-ADD ON: RBC / HPF: NONE SEEN RBC/hpf (ref 0–5)

## 2016-07-29 LAB — POCT PREGNANCY, URINE: Preg Test, Ur: NEGATIVE

## 2016-07-29 MED ORDER — METRONIDAZOLE 500 MG PO TABS: 500.0000 mg | ORAL_TABLET | Freq: Two times a day (BID) | ORAL | 0 refills | Status: DC

## 2016-07-29 NOTE — MAU Note (Signed)
Pt reports really bad pain in her lower abd for a couple of months, has been taking OTC without relief.

## 2016-07-29 NOTE — Discharge Instructions (Signed)

## 2016-07-29 NOTE — MAU Provider Note (Signed)
History     CSN: [id number]  Arrival date and time: 07/29/16 1935   None     No chief complaint on file.  Non-pregnant female c/o intermittent lower abdominal cramping and pressure x2 months. She is using ASA with no relief. She describes as "feels like I'm going to come on my period". No menses since March. She denies fevers. She reports polyuria and urgency x1 week. She denies dysuria and hematuria. She denies N/V/C/D. She denies vaginal discharge and odor. No new partner. She is not currently having pain.   Past Medical History:  Diagnosis Date  . Anemia   . Ejection fraction     Past Surgical History:  Procedure Laterality Date  . LEFT HEART CATHETERIZATION WITH CORONARY ANGIOGRAM N/A 05/30/2014   Procedure: LEFT HEART CATHETERIZATION WITH CORONARY ANGIOGRAM;  Surgeon: Peter M Martinique, MD;  Location: [facility] CATH LAB;  Service: Cardiovascular;  Laterality: N/A;  . TUBAL LIGATION      Family History  Problem Relation Age of Onset  . Hypertension Mother   . Diabetes Father   . Hypertension Maternal Aunt   . Hypertension Maternal Uncle   . Cancer Maternal Grandmother   . Hypertension Maternal Grandmother   . Stroke Maternal Grandmother   . Stroke Maternal Grandfather     Social History  Substance Use Topics  . Smoking status: Never Smoker  . Smokeless tobacco: Never Used  . Alcohol use No    Allergies: No Known Allergies  Prescriptions Prior to Admission  Medication Sig Dispense Refill Last Dose  . albuterol (PROVENTIL HFA;VENTOLIN HFA) 108 (90 BASE) MCG/ACT inhaler Inhale 2 puffs into the lungs every 6 (six) hours as needed for wheezing or shortness of breath.   Past Month at Unknown time  . ferrous sulfate 325 (65 FE) MG EC tablet Take 1 tablet (325 mg total) by mouth 2 (two) times daily. (Patient not taking: Reported on 12/03/2015) 60 tablet 3 08/11/2015 at Unknown time  . HYDROcodone-acetaminophen (NORCO/VICODIN) 5-325 MG tablet Take 2 tablets by mouth every 4 (four)  hours as needed. 10 tablet 0   . methylPREDNISolone (MEDROL DOSEPAK) 4 MG TBPK tablet 6 po on day 1, then decrease by 1 per day 21 tablet 0   . naproxen sodium (ANAPROX DS) 550 MG tablet Take 1 tablet (550 mg total) by mouth 2 (two) times daily with a meal. (Patient not taking: Reported on 12/03/2015) 60 tablet 2 not pu yet  . ondansetron (ZOFRAN) 4 MG tablet Take 1 tablet (4 mg total) by mouth every 6 (six) hours. (Patient not taking: Reported on 12/03/2015) 12 tablet 0     Review of Systems  Constitutional: Negative.   Gastrointestinal: Positive for abdominal pain. Negative for constipation, diarrhea, nausea and vomiting.  Genitourinary: Positive for frequency and urgency. Negative for dysuria, flank pain and hematuria.   Physical Exam   Blood pressure 135/83, pulse 94, temperature 98.6 F (37 C), temperature source Oral, resp. rate 16, height 5\' 9"  (1.753 m), weight 85.3 kg (188 lb), SpO2 99 %.  Physical Exam  Constitutional: She is oriented to person, place, and time. She appears well-developed and well-nourished.  HENT:  Head: Normocephalic and atraumatic.  Neck: Normal range of motion. Neck supple.  Cardiovascular: Normal rate.   Respiratory: Effort normal.  GI: Soft. She exhibits no distension. There is no hepatosplenomegaly. There is tenderness in the right lower quadrant and left lower quadrant. There is no rigidity, no rebound and no guarding.  Genitourinary:  Genitourinary Comments: External:  no lesions Vagina: rugated, parous, thin white discharge Uterus: slightly enlarged, anteverted, non tender, no CMT Adnexae: no masses, mild tenderness left, mild tenderness right   Musculoskeletal: Normal range of motion.  Neurological: She is alert and oriented to person, place, and time.  Skin: Skin is warm and dry.  Psychiatric: She has a normal mood and affect.   Results for orders placed or performed during the hospital encounter of 07/29/16 (from the past 24 hour(s))   Urinalysis, Routine w reflex microscopic (not at [facility])     Status: Abnormal   Collection Time: 07/29/16  7:46 PM  Result Value Ref Range   Color, Urine YELLOW YELLOW   APPearance CLEAR CLEAR   Specific Gravity, Urine 1.015 1.005 - 1.030   pH 6.0 5.0 - 8.0   Glucose, UA NEGATIVE NEGATIVE mg/dL   Hgb urine dipstick TRACE (A) NEGATIVE   Bilirubin Urine NEGATIVE NEGATIVE   Ketones, ur NEGATIVE NEGATIVE mg/dL   Protein, ur NEGATIVE NEGATIVE mg/dL   Nitrite NEGATIVE NEGATIVE   Leukocytes, UA NEGATIVE NEGATIVE  Urine microscopic-add on     Status: Abnormal   Collection Time: 07/29/16  7:46 PM  Result Value Ref Range   Squamous Epithelial / LPF 0-5 (A) NONE SEEN   WBC, UA 0-5 0 - 5 WBC/hpf   RBC / HPF NONE SEEN 0 - 5 RBC/hpf   Bacteria, UA RARE (A) NONE SEEN  Pregnancy, urine POC     Status: None   Collection Time: 07/29/16  7:54 PM  Result Value Ref Range   Preg Test, Ur NEGATIVE NEGATIVE  Wet prep, genital     Status: Abnormal   Collection Time: 07/29/16  8:09 PM  Result Value Ref Range   Yeast Wet Prep HPF POC NONE SEEN NONE SEEN   Trich, Wet Prep NONE SEEN NONE SEEN   Clue Cells Wet Prep HPF POC PRESENT (A) NONE SEEN   WBC, Wet Prep HPF POC NONE SEEN NONE SEEN   Sperm NONE SEEN   CBC     Status: Abnormal   Collection Time: 07/29/16  8:30 PM  Result Value Ref Range   WBC 5.0 4.0 - 10.5 K/uL   RBC 4.27 3.87 - 5.11 MIL/uL   Hemoglobin 11.7 (L) 12.0 - 15.0 g/dL   HCT 35.1 (L) 36.0 - 46.0 %   MCV 82.2 78.0 - 100.0 fL   MCH 27.4 26.0 - 34.0 pg   MCHC 33.3 30.0 - 36.0 g/dL   RDW 14.3 11.5 - 15.5 %   Platelets 254 150 - 400 K/uL   TVUS: normal (preliminary) MAU Course  Procedures  MDM Labs and [place] ordered and reviewed. No evidence of acute abd or pelvic process. Stable for discharge home.  Assessment and Plan   1. Bacterial vaginosis   2. Pelvic pain in female   3. Perimenopause   4. Pain, female pelvic    Discharge home Ibuprofen 600 mg po q 6 hrs prn OTC Follow up  with Gyn provider of choice if sx persist    Medication List    STOP taking these medications   HYDROcodone-acetaminophen 5-325 MG tablet Commonly known as:  NORCO/VICODIN   methylPREDNISolone 4 MG Tbpk tablet Commonly known as:  MEDROL DOSEPAK     TAKE these medications   albuterol 108 (90 Base) MCG/ACT inhaler Commonly known as:  PROVENTIL HFA;VENTOLIN HFA Inhale 2 puffs into the lungs every 6 (six) hours as needed for wheezing or shortness of breath.   metroNIDAZOLE 500 MG  tablet Commonly known as:  FLAGYL Take 1 tablet (500 mg total) by mouth 2 (two) times daily.      Julianne Handler, CNM 07/29/2016, 8:11 PM

## 2016-07-30 LAB — GC/CHLAMYDIA PROBE AMP (~~LOC~~) NOT AT ARMC
CHLAMYDIA, DNA PROBE: NEGATIVE
Neisseria Gonorrhea: NEGATIVE

## 2016-10-11 ENCOUNTER — Emergency Department (HOSPITAL_COMMUNITY)
Admission: EM | Admit: 2016-10-11 | Discharge: 2016-10-12 | Disposition: A | Discharge status: Home / Self Care | Payer: Medicaid Other | Attending: Emergency Medicine | Admitting: Emergency Medicine

## 2016-10-11 ENCOUNTER — Encounter (HOSPITAL_COMMUNITY): Payer: Self-pay

## 2016-10-11 ENCOUNTER — Emergency Department (HOSPITAL_COMMUNITY): Payer: Medicaid Other

## 2016-10-11 ENCOUNTER — Other Ambulatory Visit: Payer: Self-pay

## 2016-10-11 DIAGNOSIS — R0602 Shortness of breath: Secondary | ICD-10-CM | POA: Diagnosis present

## 2016-10-11 DIAGNOSIS — R091 Pleurisy: Secondary | ICD-10-CM | POA: Diagnosis not present

## 2016-10-11 LAB — BASIC METABOLIC PANEL
ANION GAP: 7 (ref 5–15)
BUN: 9 mg/dL (ref 6–20)
CALCIUM: 9.5 mg/dL (ref 8.9–10.3)
CO2: 24 mmol/L (ref 22–32)
Chloride: 106 mmol/L (ref 101–111)
Creatinine, Ser: 0.83 mg/dL (ref 0.44–1.00)
Glucose, Bld: 88 mg/dL (ref 65–99)
Potassium: 4.2 mmol/L (ref 3.5–5.1)
SODIUM: 137 mmol/L (ref 135–145)

## 2016-10-11 LAB — CBC
HCT: 37.2 % (ref 36.0–46.0)
HEMOGLOBIN: 12.7 g/dL (ref 12.0–15.0)
MCH: 28.7 pg (ref 26.0–34.0)
MCHC: 34.1 g/dL (ref 30.0–36.0)
MCV: 84.2 fL (ref 78.0–100.0)
Platelets: 288 10*3/uL (ref 150–400)
RBC: 4.42 MIL/uL (ref 3.87–5.11)
RDW: 14.2 % (ref 11.5–15.5)
WBC: 4.9 10*3/uL (ref 4.0–10.5)

## 2016-10-11 LAB — D-DIMER, QUANTITATIVE (NOT AT ARMC): D DIMER QUANT: 0.9 ug{FEU}/mL — AB (ref 0.00–0.50)

## 2016-10-11 LAB — I-STAT TROPONIN, ED: TROPONIN I, POC: 0.01 ng/mL (ref 0.00–0.08)

## 2016-10-11 MED ORDER — ACETAMINOPHEN 500 MG PO TABS
1000.0000 mg | ORAL_TABLET | Freq: Once | ORAL | Status: AC
  Administered 2016-10-11: 1000 mg via ORAL
  Filled 2016-10-11: qty 2

## 2016-10-11 MED ORDER — IOPAMIDOL (ISOVUE-370) INJECTION 76%
INTRAVENOUS | Status: AC
  Administered 2016-10-11: 80 mL
  Filled 2016-10-11: qty 100

## 2016-10-11 NOTE — ED Provider Notes (Signed)
[place] DEPT Provider Note   CSN: [id number] Arrival date & time: 10/11/16  1731     History   Chief Complaint Chief Complaint  Patient presents with  . Shortness of Breath    HPI Sheila [name] is a 45 y.o. female.  HPI Patient developed pain at left posterior chest nonradiating pleuritic onset 12 noon today gradually. She denies any shortness of breath denies anterior chest pain pain is nonexertional. No treatment prior to coming here. Pain is worse with deep breathing not improved by anything is moderate at present. No cough no fever no other associated symptoms Past Medical History:  Diagnosis Date  . Anemia   . Ejection fraction   Ejection fraction 55-60% from echocardiogram dobutamine stress test July 2015 Kari catheterization from 05/30/2014 showed normal coronary anatomy, normal LV function  Patient Active Problem List   Diagnosis Date Noted  . Chest pain at rest 05/30/2014  . Ejection fraction   . Abnormal EKG 04/24/2014  . Chest discomfort 04/23/2014  . Panic attack 04/23/2014  . OBESITY 01/15/2009  . HAIR LOSS 01/15/2009  . ANEMIA-NOS 01/14/2009    Past Surgical History:  Procedure Laterality Date  . LEFT HEART CATHETERIZATION WITH CORONARY ANGIOGRAM N/A 05/30/2014   Procedure: LEFT HEART CATHETERIZATION WITH CORONARY ANGIOGRAM;  Surgeon: Peter M Martinique, MD;  Location: [facility] CATH LAB;  Service: Cardiovascular;  Laterality: N/A;  . TUBAL LIGATION      OB History    Gravida Para Term Preterm AB Living   8 6 4 2 2 4    SAB TAB Ectopic Multiple Live Births   2               Home Medications    Prior to Admission medications   Medication Sig Start Date End Date Taking? Authorizing Provider  albuterol (PROVENTIL HFA;VENTOLIN HFA) 108 (90 BASE) MCG/ACT inhaler Inhale 2 puffs into the lungs every 6 (six) hours as needed for wheezing or shortness of breath.    Historical Provider, MD  metroNIDAZOLE (FLAGYL) 500 MG tablet Take 1 tablet (500 mg total)  by mouth 2 (two) times daily. 07/29/16   Julianne Handler, CNM    Family History Family History  Problem Relation Age of Onset  . Hypertension Mother   . Diabetes Father   . Hypertension Maternal Aunt   . Hypertension Maternal Uncle   . Cancer Maternal Grandmother   . Hypertension Maternal Grandmother   . Stroke Maternal Grandmother   . Stroke Maternal Grandfather     Social History Social History  Substance Use Topics  . Smoking status: Never Smoker  . Smokeless tobacco: Never Used  . Alcohol use No     Allergies   Patient has no known allergies.   Review of Systems Review of Systems  Constitutional: Negative.   HENT: Negative.   Respiratory: Negative.   Cardiovascular: Negative.   Gastrointestinal: Negative.   Musculoskeletal: Negative.   Skin: Negative.   Neurological: Positive for light-headedness.       Lightheadedness for several months, unchanged  Psychiatric/Behavioral: Negative.   All other systems reviewed and are negative.    Physical Exam Updated Vital Signs BP 142/89 (BP Location: Left Arm)   Pulse 86   Temp 98.2 F (36.8 C) (Oral)   Resp 16   Ht 5\' 9"  (1.753 m)   Wt 180 lb (81.6 kg)   SpO2 100%   BMI 26.58 kg/m   Physical Exam  Constitutional: She appears well-developed and well-nourished.  HENT:  Head:  Normocephalic and atraumatic.  Eyes: Conjunctivae are normal. Pupils are equal, round, and reactive to light.  Neck: Neck supple. No tracheal deviation present. No thyromegaly present.  Cardiovascular: Normal rate and regular rhythm.   No murmur heard. Pulmonary/Chest: Effort normal and breath sounds normal.  Abdominal: Soft. Bowel sounds are normal. She exhibits no distension. There is no tenderness.  Musculoskeletal: Normal range of motion. She exhibits no edema or tenderness.  Neurological: She is alert. Coordination normal.  Skin: Skin is warm and dry. No rash noted.  Psychiatric: She has a normal mood and affect.  Nursing note  and vitals reviewed.    ED Treatments / Results  Labs (all labs ordered are listed, but only abnormal results are displayed) Labs Reviewed  [place], ED    EKG  EKG Interpretation  Date/Time:  Monday October 11 2016 17:37:07 EST Ventricular Rate:  86 PR Interval:  122 QRS Duration: 72 QT Interval:  328 QTC Calculation: 392 R Axis:   25 Text Interpretation:  Normal sinus rhythm Nonspecific T wave abnormality Abnormal ECG No significant change since last tracing Confirmed by Winfred Leeds  MD, Zanasia Hickson [phone]) on 10/11/2016 8:22:19 PM      Results for orders placed or performed during the hospital encounter of [id number]  Basic metabolic panel  Result Value Ref Range   Sodium 137 135 - 145 mmol/L   Potassium 4.2 3.5 - 5.1 mmol/L   Chloride 106 101 - 111 mmol/L   CO2 24 22 - 32 mmol/L   Glucose, Bld 88 65 - 99 mg/dL   BUN 9 6 - 20 mg/dL   Creatinine, Ser 0.83 0.44 - 1.00 mg/dL   Calcium 9.5 8.9 - 10.3 mg/dL   GFR calc non Af Amer >60 >60 mL/min   GFR calc Af Amer >60 >60 mL/min   Anion gap 7 5 - 15  CBC  Result Value Ref Range   WBC 4.9 4.0 - 10.5 K/uL   RBC 4.42 3.87 - 5.11 MIL/uL   Hemoglobin 12.7 12.0 - 15.0 g/dL   HCT 37.2 36.0 - 46.0 %   MCV 84.2 78.0 - 100.0 fL   MCH 28.7 26.0 - 34.0 pg   MCHC 34.1 30.0 - 36.0 g/dL   RDW 14.2 11.5 - 15.5 %   Platelets 288 150 - 400 K/uL  D-dimer, quantitative (not at [facility])  Result Value Ref Range   D-Dimer, Quant 0.90 (H) 0.00 - 0.50 ug/mL-FEU  I-stat troponin, ED  Result Value Ref Range   Troponin i, poc 0.01 0.00 - 0.08 ng/mL   Comment 3           Dg Chest 2 View  Result Date: 10/11/2016 CLINICAL DATA:  Shortness of breath chest pain left-sided radiating between shoulder blades since this afternoon. EXAM: CHEST  2 VIEW COMPARISON:  12/03/2015 FINDINGS: Lungs are adequately inflated without consolidation or effusion. No pneumothorax. Cardiomediastinal silhouette, bones and soft tissues are  within normal. IMPRESSION: No active cardiopulmonary disease. Electronically Signed   By: Marin Olp M.D.   On: 10/11/2016 18:32   Ct Angio Chest Pe W Or Wo Contrast  Result Date: 10/11/2016 CLINICAL DATA:  Shortness of breath and chest pain. Elevated D-dimer. EXAM: CT ANGIOGRAPHY CHEST WITH CONTRAST TECHNIQUE: Multidetector CT imaging of the chest was performed using the standard protocol during bolus administration of intravenous contrast. Multiplanar CT image reconstructions and MIPs were obtained to evaluate the vascular anatomy. CONTRAST:  80 mL Isovue 370 IV  COMPARISON:  Chest radiograph 10/11/2016 FINDINGS: Cardiovascular: Contrast injection is sufficient to demonstrate satisfactory opacification of the pulmonary arteries to the segmental level. There is no pulmonary embolus. The main pulmonary artery is within normal limits for size. There is no CT evidence of acute right heart strain. There is a normal 3-vessel aortic arch branching pattern. Heart size is normal, without pericardial effusion. Mediastinum/Nodes: No mediastinal, hilar or axillary lymphadenopathy. The visualized thyroid and thoracic esophageal course are unremarkable. Lungs/Pleura: No pulmonary nodules or masses. No pleural effusion or pneumothorax. No focal airspace consolidation. No focal pleural abnormality. There is bibasilar dependent subsegmental atelectasis. Upper Abdomen: Contrast bolus timing is not optimized for evaluation of the abdominal organs. Within this limitation, the visualized organs of the upper abdomen are normal. Musculoskeletal: No chest wall abnormality. No acute or significant osseous findings. Review of the MIP images confirms the above findings. IMPRESSION: 1. No pulmonary embolus. 2. No acute abnormality of the thorax. Electronically Signed   By: Ulyses Jarred M.D.   On: 10/11/2016 23:32    Radiology Dg Chest 2 View  Result Date: 10/11/2016 CLINICAL DATA:  Shortness of breath chest pain left-sided  radiating between shoulder blades since this afternoon. EXAM: CHEST  2 VIEW COMPARISON:  12/03/2015 FINDINGS: Lungs are adequately inflated without consolidation or effusion. No pneumothorax. Cardiomediastinal silhouette, bones and soft tissues are within normal. IMPRESSION: No active cardiopulmonary disease. Electronically Signed   By: Marin Olp M.D.   On: 10/11/2016 18:32   Chest x-ray viewed by me Procedures Procedures (including critical care time)  Medications Ordered in ED Medications - No data to display   Initial Impression / Assessment and Plan / ED Course  I have reviewed the triage vital signs and the nursing notes.  Pertinent labs & imaging results that were available during my care of the patient were reviewed by me and considered in my medical decision making (see chart for details).  Clinical Course     12:10 AM patient resting comfortably. No distress Strongly doubt acute coronary syndrome. Highly atypical symptoms. Patient had negative cardiac catheterization 2015. Symptoms consistent with pleurisy. Low pretest clinical suspicion for pulmonary embolism or aortic dissection. Negative CT angiogram. Plan Tylenol or Advil for pain. Referral [facility] and [place] Final Clinical Impressions(s) / ED Diagnoses  Diagnosis pleurisy Final diagnoses:  None    New Prescriptions New Prescriptions   No medications on file     Orlie Dakin, MD 10/12/16 0020

## 2016-10-11 NOTE — ED Triage Notes (Signed)
Pt reports sudden onset of shortness of breath around 1230. She reports it is painful to breath. No acute distress noted.

## 2016-10-11 NOTE — ED Notes (Signed)
Patient transported to CT 

## 2016-10-12 NOTE — Discharge Instructions (Signed)
Take Tylenol or or Advil as directed for pain. Contact the [facility] tomorrow to arrange to get a primary care physician. Return if concern for any reason.

## 2016-10-12 NOTE — ED Notes (Signed)
Unable to sign paper due to room not having signature pad, dc instructions given and pt and family verbalized understanding.

## 2017-03-22 ENCOUNTER — Inpatient Hospital Stay (HOSPITAL_COMMUNITY)
Admission: AD | Admit: 2017-03-22 | Discharge: 2017-03-22 | Disposition: A | Discharge status: Home / Self Care | Payer: Medicaid Other | Source: Ambulatory Visit | Attending: Family Medicine | Admitting: Family Medicine

## 2017-03-22 ENCOUNTER — Encounter (HOSPITAL_COMMUNITY): Payer: Self-pay

## 2017-03-22 DIAGNOSIS — Z809 Family history of malignant neoplasm, unspecified: Secondary | ICD-10-CM | POA: Diagnosis not present

## 2017-03-22 DIAGNOSIS — R109 Unspecified abdominal pain: Secondary | ICD-10-CM | POA: Diagnosis not present

## 2017-03-22 DIAGNOSIS — Z833 Family history of diabetes mellitus: Secondary | ICD-10-CM | POA: Diagnosis not present

## 2017-03-22 DIAGNOSIS — Z823 Family history of stroke: Secondary | ICD-10-CM | POA: Diagnosis not present

## 2017-03-22 DIAGNOSIS — R110 Nausea: Secondary | ICD-10-CM | POA: Diagnosis not present

## 2017-03-22 DIAGNOSIS — Z8249 Family history of ischemic heart disease and other diseases of the circulatory system: Secondary | ICD-10-CM | POA: Insufficient documentation

## 2017-03-22 DIAGNOSIS — Z9889 Other specified postprocedural states: Secondary | ICD-10-CM | POA: Insufficient documentation

## 2017-03-22 DIAGNOSIS — Z79899 Other long term (current) drug therapy: Secondary | ICD-10-CM | POA: Diagnosis not present

## 2017-03-22 DIAGNOSIS — L0882 Omphalitis not of newborn: Secondary | ICD-10-CM

## 2017-03-22 LAB — CBC WITH DIFFERENTIAL/PLATELET
Basophils Absolute: 0 10*3/uL (ref 0.0–0.1)
Basophils Relative: 0 %
Eosinophils Absolute: 0.1 10*3/uL (ref 0.0–0.7)
Eosinophils Relative: 3 %
HEMATOCRIT: 34.1 % — AB (ref 36.0–46.0)
Hemoglobin: 11.6 g/dL — ABNORMAL LOW (ref 12.0–15.0)
LYMPHS PCT: 36 %
Lymphs Abs: 1.8 10*3/uL (ref 0.7–4.0)
MCH: 29.1 pg (ref 26.0–34.0)
MCHC: 34 g/dL (ref 30.0–36.0)
MCV: 85.7 fL (ref 78.0–100.0)
MONO ABS: 0.2 10*3/uL (ref 0.1–1.0)
MONOS PCT: 4 %
NEUTROS ABS: 2.9 10*3/uL (ref 1.7–7.7)
Neutrophils Relative %: 57 %
PLATELETS: 248 10*3/uL (ref 150–400)
RBC: 3.98 MIL/uL (ref 3.87–5.11)
RDW: 13 % (ref 11.5–15.5)
WBC: 5 10*3/uL (ref 4.0–10.5)

## 2017-03-22 LAB — URINALYSIS, ROUTINE W REFLEX MICROSCOPIC
Bacteria, UA: NONE SEEN
Bilirubin Urine: NEGATIVE
GLUCOSE, UA: NEGATIVE mg/dL
Ketones, ur: NEGATIVE mg/dL
Leukocytes, UA: NEGATIVE
NITRITE: NEGATIVE
PROTEIN: NEGATIVE mg/dL
SPECIFIC GRAVITY, URINE: 1.01 (ref 1.005–1.030)
pH: 5 (ref 5.0–8.0)

## 2017-03-22 LAB — POCT PREGNANCY, URINE: Preg Test, Ur: NEGATIVE

## 2017-03-22 MED ORDER — SULFAMETHOXAZOLE-TRIMETHOPRIM 800-160 MG PO TABS: 1.0000 | ORAL_TABLET | Freq: Two times a day (BID) | ORAL | 0 refills | Status: DC

## 2017-03-22 NOTE — MAU Provider Note (Signed)
History     CSN: [id number]  Arrival date and time: 03/22/17 2042   First Provider Initiated Contact with Patient 03/22/17 2121      No chief complaint on file.  Patient is a 46 year old female who presents today with abdominal pain and discharge from her navel. She reports she had a tubal ligation through her navel 15 years ago. She reports ever since she's had trouble with abdominal pain around her naval. She reports recently over the past week or so she's had significant discharge from the navel however. She denies fevers or chills but does report increasing pain. She tries to clean the area out with a Q-tip and it is very painful for her and makes her nauseous. She's never had this problem before.    OB History    Gravida Para Term Preterm AB Living   8 6 4 2 2 4    SAB TAB Ectopic Multiple Live Births   2              Past Medical History:  Diagnosis Date  . Anemia   . Ejection fraction     Past Surgical History:  Procedure Laterality Date  . LEFT HEART CATHETERIZATION WITH CORONARY ANGIOGRAM N/A 05/30/2014   Procedure: LEFT HEART CATHETERIZATION WITH CORONARY ANGIOGRAM;  Surgeon: Peter M Martinique, MD;  Location: [facility] CATH LAB;  Service: Cardiovascular;  Laterality: N/A;  . TUBAL LIGATION      Family History  Problem Relation Age of Onset  . Hypertension Mother   . Diabetes Father   . Hypertension Maternal Aunt   . Hypertension Maternal Uncle   . Cancer Maternal Grandmother   . Hypertension Maternal Grandmother   . Stroke Maternal Grandmother   . Stroke Maternal Grandfather     Social History  Substance Use Topics  . Smoking status: Never Smoker  . Smokeless tobacco: Never Used  . Alcohol use No    Allergies: No Known Allergies  Prescriptions Prior to Admission  Medication Sig Dispense Refill Last Dose  . albuterol (PROVENTIL HFA;VENTOLIN HFA) 108 (90 BASE) MCG/ACT inhaler Inhale 2 puffs into the lungs every 6 (six) hours as needed for wheezing or shortness  of breath.   unk  . Multiple Vitamin (MULTIVITAMIN WITH MINERALS) TABS tablet Take 1 tablet by mouth daily.   10/10/2016 at Unknown time    Review of Systems  Constitutional: Negative for chills and fever.  HENT: Negative for congestion and rhinorrhea.   Respiratory: Negative for cough and shortness of breath.   Cardiovascular: Negative for chest pain and palpitations.  Gastrointestinal: Positive for abdominal pain. Negative for abdominal distention, constipation, diarrhea, nausea and vomiting.  Genitourinary: Negative for difficulty urinating, dysuria, frequency and hematuria.  Neurological: Negative for dizziness and weakness.   Physical Exam   Blood pressure (!) 143/87, pulse 86, temperature 98 F (36.7 C), resp. rate 18, SpO2 100 %.  Physical Exam  Vitals reviewed. Constitutional: She is oriented to person, place, and time. She appears well-developed and well-nourished.  Cardiovascular: Normal rate and intact distal pulses.   Respiratory: Effort normal. No respiratory distress.  GI: Soft. Bowel sounds are normal. She exhibits no distension. There is tenderness. There is no rebound and no guarding.  Small amount of discharge noted in the umbilicus, minimal irritation noted. Small raised cystlike lesions at the edge of the umbilicus.  Musculoskeletal: Normal range of motion. She exhibits no edema.  Neurological: She is alert and oriented to person, place, and time.  Skin: Skin is warm  and dry.  Psychiatric: She has a normal mood and affect. Her behavior is normal.    MAU Course  Procedures  MDM In MA U patient underwent evaluation with physical exam and a CBC. CBC was unremarkable with only mild anemia noted. At this time patient likely has omphalitis. We'll treat with Bactrim twice a day for 7 days. If no improvement we'll recommend follow-up with surgery for evaluation of possible urachal cyst. Assessment and Plan  #omphalitis: Plan for Bactrim twice a day 7 days follow-up  with general surgery if not improving. Patient given number for general surgery.  Jacquiline Doe 03/22/2017, 9:57 PM

## 2017-03-22 NOTE — Progress Notes (Signed)
Presents to triage for pain around the naval area past 2 days and smells of odor and leaking yellowish color. Also c/o chest pain that started coming here while driving.

## 2017-03-22 NOTE — Discharge Instructions (Signed)
Cellulitis, Adult Cellulitis is a skin infection. The infected area is usually red and sore. This condition occurs most often in the arms and lower legs. It is very important to get treated for this condition. Follow these instructions at home:  Take over-the-counter and prescription medicines only as told by your doctor.  If you were prescribed an antibiotic medicine, take it as told by your doctor. Do not stop taking the antibiotic even if you start to feel better.  Drink enough fluid to keep your pee (urine) clear or pale yellow.  Do not touch or rub the infected area.  Raise (elevate) the infected area above the level of your heart while you are sitting or lying down.  Place warm or cold wet cloths (warm or cold compresses) on the infected area. Do this as told by your doctor.  Keep all follow-up visits as told by your doctor. This is important. These visits let your doctor make sure your infection is not getting worse. Contact a doctor if:  You have a fever.  Your symptoms do not get better after 1-2 days of treatment.  Your bone or joint under the infected area starts to hurt after the skin has healed.  Your infection comes back. This can happen in the same area or another area.  You have a swollen bump in the infected area.  You have new symptoms.  You feel ill and also have muscle aches and pains. Get help right away if:  Your symptoms get worse.  You feel very sleepy.  You throw up (vomit) or have watery poop (diarrhea) for a long time.  There are red streaks coming from the infected area.  Your red area gets larger.  Your red area turns darker. This information is not intended to replace advice given to you by your health care provider. Make sure you discuss any questions you have with your health care provider. Document Released: 04/19/2008 Document Revised: 04/08/2016 Document Reviewed: 09/10/2015 Elsevier Interactive Patient Education  2017 [employer]vier  Inc.  

## 2017-09-15 ENCOUNTER — Ambulatory Visit (HOSPITAL_COMMUNITY)
Admission: EM | Admit: 2017-09-15 | Discharge: 2017-09-15 | Disposition: A | Discharge status: Home / Self Care | Payer: Medicaid Other

## 2017-09-15 ENCOUNTER — Emergency Department (HOSPITAL_COMMUNITY): Payer: Medicaid Other

## 2017-09-15 ENCOUNTER — Emergency Department (HOSPITAL_COMMUNITY)
Admission: EM | Admit: 2017-09-15 | Discharge: 2017-09-15 | Disposition: A | Discharge status: Home / Self Care | Payer: Medicaid Other | Attending: Emergency Medicine | Admitting: Emergency Medicine

## 2017-09-15 ENCOUNTER — Encounter (HOSPITAL_COMMUNITY): Payer: Self-pay

## 2017-09-15 ENCOUNTER — Encounter (HOSPITAL_COMMUNITY): Payer: Self-pay | Admitting: Emergency Medicine

## 2017-09-15 DIAGNOSIS — R519 Headache, unspecified: Secondary | ICD-10-CM

## 2017-09-15 DIAGNOSIS — R51 Headache: Secondary | ICD-10-CM

## 2017-09-15 DIAGNOSIS — R404 Transient alteration of awareness: Secondary | ICD-10-CM | POA: Diagnosis not present

## 2017-09-15 DIAGNOSIS — R0602 Shortness of breath: Secondary | ICD-10-CM | POA: Diagnosis not present

## 2017-09-15 DIAGNOSIS — D649 Anemia, unspecified: Secondary | ICD-10-CM | POA: Diagnosis not present

## 2017-09-15 DIAGNOSIS — G44309 Post-traumatic headache, unspecified, not intractable: Secondary | ICD-10-CM | POA: Insufficient documentation

## 2017-09-15 DIAGNOSIS — Z79899 Other long term (current) drug therapy: Secondary | ICD-10-CM | POA: Insufficient documentation

## 2017-09-15 LAB — CBC WITH DIFFERENTIAL/PLATELET
BASOS ABS: 0 10*3/uL (ref 0.0–0.1)
Basophils Relative: 0 %
EOS PCT: 3 %
Eosinophils Absolute: 0.2 10*3/uL (ref 0.0–0.7)
HEMATOCRIT: 36.5 % (ref 36.0–46.0)
Hemoglobin: 12.3 g/dL (ref 12.0–15.0)
LYMPHS PCT: 30 %
Lymphs Abs: 1.5 10*3/uL (ref 0.7–4.0)
MCH: 28.8 pg (ref 26.0–34.0)
MCHC: 33.7 g/dL (ref 30.0–36.0)
MCV: 85.5 fL (ref 78.0–100.0)
MONO ABS: 0.4 10*3/uL (ref 0.1–1.0)
MONOS PCT: 8 %
NEUTROS ABS: 2.9 10*3/uL (ref 1.7–7.7)
Neutrophils Relative %: 59 %
PLATELETS: 281 10*3/uL (ref 150–400)
RBC: 4.27 MIL/uL (ref 3.87–5.11)
RDW: 13.2 % (ref 11.5–15.5)
WBC: 4.9 10*3/uL (ref 4.0–10.5)

## 2017-09-15 LAB — BASIC METABOLIC PANEL
ANION GAP: 8 (ref 5–15)
BUN: 9 mg/dL (ref 6–20)
CALCIUM: 9.5 mg/dL (ref 8.9–10.3)
CO2: 24 mmol/L (ref 22–32)
CREATININE: 0.8 mg/dL (ref 0.44–1.00)
Chloride: 107 mmol/L (ref 101–111)
GFR calc Af Amer: >60 mL/min (ref >60)
GLUCOSE: 106 mg/dL — AB (ref 65–99)
Potassium: 4 mmol/L (ref 3.5–5.1)
Sodium: 139 mmol/L (ref 135–145)

## 2017-09-15 LAB — I-STAT TROPONIN, ED: Troponin i, poc: 0 ng/mL (ref 0.00–0.08)

## 2017-09-15 MED ORDER — SODIUM CHLORIDE 0.9 % IV BOLUS (SEPSIS): 1000.0000 mL | Freq: Once | INTRAVENOUS | Status: DC

## 2017-09-15 MED ORDER — PROCHLORPERAZINE EDISYLATE 5 MG/ML IJ SOLN
10.0000 mg | Freq: Once | INTRAMUSCULAR | Status: DC
  Filled 2017-09-15: qty 2

## 2017-09-15 MED ORDER — METOCLOPRAMIDE HCL 5 MG/ML IJ SOLN
10.0000 mg | Freq: Once | INTRAMUSCULAR | Status: AC
  Administered 2017-09-15: 10 mg via INTRAMUSCULAR
  Filled 2017-09-15: qty 2

## 2017-09-15 MED ORDER — IBUPROFEN 600 MG PO TABS: 600.0000 mg | ORAL_TABLET | Freq: Four times a day (QID) | ORAL | 0 refills | Status: DC | PRN

## 2017-09-15 MED ORDER — DIPHENHYDRAMINE HCL 25 MG PO CAPS
25.0000 mg | ORAL_CAPSULE | Freq: Once | ORAL | Status: AC
  Administered 2017-09-15: 25 mg via ORAL
  Filled 2017-09-15: qty 1

## 2017-09-15 MED ORDER — DIPHENHYDRAMINE HCL 50 MG/ML IJ SOLN
25.0000 mg | Freq: Once | INTRAMUSCULAR | Status: DC
  Filled 2017-09-15: qty 1

## 2017-09-15 MED ORDER — NAPROXEN 250 MG PO TABS
500.0000 mg | ORAL_TABLET | Freq: Once | ORAL | Status: AC
  Administered 2017-09-15: 500 mg via ORAL
  Filled 2017-09-15: qty 2

## 2017-09-15 NOTE — Discharge Instructions (Signed)
We saw you in the ER for headaches. All the labs and imaging are normal. We are not sure what is causing your headaches, however, there appears to be no evidence of infection, bleeds or tumors based on our exam and results.  Please take motrin round the clock for the next 6 hours, and take other meds prescribed only for break through pain. See your doctor if the pain persists, as you might need better medications or a specialist.  Please return to the ER if the headache gets severe and in not improving, you have associated new one sided numbness, tingling, weakness or confusion, seizures, poor balance or poor vision.

## 2017-09-15 NOTE — ED Provider Notes (Signed)
[facility]    CSN: [id number] Arrival date & time: 09/15/17  1652     History   Chief Complaint Chief Complaint  Patient presents with  . Shortness of Breath    HPI Sheila [name] is a 46 y.o. female.    46 year old female is accompanied by her husband stating that after she awoke from a nap today between 4:30 and 5:30 and she had acute shortness of breath associated with coughing. She had cloudy, confused all. She complained of left-sided arm pain, facial twitching and could not talk  For a brief period of time.  She is having difficulty in explaining and describing her current history of present illness and recent events. Slow to respond but alert, she notes her confusion and memory difficulties. Also with arm and thumb pain, likely tendinitis.  Pt and husband poor historians but both st her behavior has not been her normal in the past 90 minutes or so.       Past Medical History:  Diagnosis Date  . Anemia   . Ejection fraction     Patient Active Problem List   Diagnosis Date Noted  . Chest pain at rest 05/30/2014  . Ejection fraction   . Abnormal EKG 04/24/2014  . Chest discomfort 04/23/2014  . Panic attack 04/23/2014  . OBESITY 01/15/2009  . HAIR LOSS 01/15/2009  . ANEMIA-NOS 01/14/2009    Past Surgical History:  Procedure Laterality Date  . LEFT HEART CATHETERIZATION WITH CORONARY ANGIOGRAM N/A 05/30/2014   Procedure: LEFT HEART CATHETERIZATION WITH CORONARY ANGIOGRAM;  Surgeon: Peter M Martinique, MD;  Location: [facility] CATH LAB;  Service: Cardiovascular;  Laterality: N/A;  . TUBAL LIGATION      OB History    Gravida Para Term Preterm AB Living   8 6 4 2 2 4    SAB TAB Ectopic Multiple Live Births   2               Home Medications    Prior to Admission medications   Medication Sig Start Date End Date Taking? Authorizing Provider  albuterol (PROVENTIL HFA;VENTOLIN HFA) 108 (90 BASE) MCG/ACT inhaler Inhale 2 puffs into the lungs every 6 (six)  hours as needed for wheezing or shortness of breath.    [provider]  Multiple Vitamin (MULTIVITAMIN WITH MINERALS) TABS tablet Take 1 tablet by mouth daily.    [provider]  sulfamethoxazole-trimethoprim (BACTRIM DS,SEPTRA DS) 800-160 MG tablet Take 1 tablet by mouth 2 (two) times daily. 03/22/17   Waldemar Dickens, MD    Family History Family History  Problem Relation Age of Onset  . Hypertension Mother   . Diabetes Father   . Hypertension Maternal Aunt   . Hypertension Maternal Uncle   . Cancer Maternal Grandmother   . Hypertension Maternal Grandmother   . Stroke Maternal Grandmother   . Stroke Maternal Grandfather     Social History Social History  Substance Use Topics  . Smoking status: Never Smoker  . Smokeless tobacco: Never Used  . Alcohol use No     Allergies   Patient has no known allergies.   Review of Systems Review of Systems  Constitutional: Positive for activity change. Negative for fever.  HENT: Negative.   Eyes: Negative.   Respiratory: Positive for cough and shortness of breath.   Cardiovascular: Positive for chest pain.       Sharp, shooting and brief in anterior chest.  Gastrointestinal: Negative.   Genitourinary: Negative.   Neurological: Positive  for speech difficulty, light-headedness and numbness. Negative for tremors, seizures and facial asymmetry.       Questionable syncope as there was a period of time she is uncertain about remembering during this incident.  Psychiatric/Behavioral:       Flat affect     Physical Exam Triage Vital Signs ED Triage Vitals  Enc Vitals Group     BP 09/15/17 1723 (!) 161/98     Pulse Rate 09/15/17 1723 86     Resp 09/15/17 1723 18     Temp 09/15/17 1723 98.2 F (36.8 C)     Temp Source 09/15/17 1723 Oral     SpO2 09/15/17 1723 98 %     Weight --      Height --      Head Circumference --      Peak Flow --      Pain Score 09/15/17 1721 7     Pain Loc --      Pain Edu?  --      Excl. in [place]? --    No data found.   Updated Vital Signs BP (!) 161/98 (BP Location: Right Arm)   Pulse 86   Temp 98.2 F (36.8 C) (Oral)   Resp 18   SpO2 98%   Visual Acuity Right Eye Distance:   Left Eye Distance:   Bilateral Distance:    Right Eye Near:   Left Eye Near:    Bilateral Near:     Physical Exam  Constitutional: She appears well-developed and well-nourished. No distress.  HENT:  Head: Normocephalic and atraumatic.  Eyes: EOM are normal.  Neck: Normal range of motion. Neck supple.  Cardiovascular: Normal rate, regular rhythm, normal heart sounds and intact distal pulses.   Pulmonary/Chest: Effort normal and breath sounds normal. No respiratory distress. She has no wheezes.  Musculoskeletal:  Positive Finkelstein sign to the left thumb. Pain radiates along the extensor pollicis longus.  Lymphadenopathy:    She has no cervical adenopathy.  Neurological: She is alert. She has normal strength. No sensory deficit. She exhibits normal muscle tone. Coordination normal.  Finger to nose normal  Skin: Skin is warm and dry.  Psychiatric: Her speech is delayed. Cognition and memory are not impaired. She exhibits abnormal recent memory.  Nursing note and vitals reviewed.    UC Treatments / Results  Labs (all labs ordered are listed, but only abnormal results are displayed) Labs Reviewed - No data to display  EKG  EKG Interpretation None       Radiology No results found.  Procedures Procedures (including critical care time)  Medications Ordered in UC Medications - No data to display   Initial Impression / Assessment and Plan / UC Course  I have reviewed the triage vital signs and the nursing notes.  Pertinent labs & imaging results that were available during my care of the patient were reviewed by me and considered in my medical decision making (see chart for details).       Final Clinical Impressions(s) / UC Diagnoses   Final  diagnoses:  None    New Prescriptions New Prescriptions   No medications on file     Controlled Substance Prescriptions [place] Controlled Substance Registry consulted? Not Applicable   Janne Napoleon, NP 09/15/17 1744

## 2017-09-15 NOTE — ED Notes (Signed)
Patient access staff says patient answered questions related to birth date and address and name without difficulty.

## 2017-09-15 NOTE — ED Notes (Signed)
Janne Napoleon, NP evaluated patient and instructed patient to go to ed, now,  and stated husband could take her.

## 2017-09-15 NOTE — ED Triage Notes (Signed)
Pt reports LKW 1500- when she woke up from nap with pounding headache on right side of head. Pt states, "I woke up coughing, couldn't breathe, thought I was going to pass out. I thought I was calling my daughters name but she said I wasn't. I can't get my thoughts"  Pt endorses right sided face pain but tingling to left arm. No neuro deficits noted. VAN neg. Dr. Tyrone Nine at bedside

## 2017-09-15 NOTE — ED Triage Notes (Addendum)
Woke at 4:45pm Dozed off to sleep around 4:00pm  Patient has had chest pain sharp pain in chest.  Has had pain today  Woke up from nap with left side numb and tingling, left eye twitching.  Leg, arm and left side of face is tingling.  Patient says she started coughing and the inability to catch her breath woke her up and continued to have trouble catching her breath.   Patient skin is warm and dry.  Patient ambulated to treatment room with out difficulty.  Patient is moving all extremities.  Currently no coughing, NAD. Follows commands.  Patient says she is having a difficult time getting words together.  Says left face, leg and left arm are tingling.    Man that brought patient to ucc is as poor of a historian as the patient.  Man identifies self as her husband.  When asked if patient is any different than usual.  He responds yes, when asked how she is different he responds "just different " and instructs patient to answer questions.  .   Patient facial symmetry present, equal grips.

## 2017-09-15 NOTE — Discharge Instructions (Signed)
TO ED now for eval

## 2017-09-15 NOTE — ED Provider Notes (Signed)
[facility] EMERGENCY DEPARTMENT Provider Note   CSN: [id number] Arrival date & time: 09/15/17  1743     History   Chief Complaint No chief complaint on file.   HPI Sheila [name] is a 46 y.o. female.  HPI  46 year old female comes in with chief complaint of headache.  Patient reports that about 2 hours ago she woke up from her sleep with severe headache.  Patient's headache is right-sided, throbbing/pounding with associated nausea.  Patient also reports that she had some tingling sensation to the left side of her arm  Patient also reports that she was coughing when she woke up and the cough was so severe that she felt like she was going to faint.  Patient denied any chest pain to me.  Patient denies any history of coronary artery disease, and she has had a negative cath in the past. Pt has no active chest pain.  Patient has no history of PE or DVT and denies any risk factors for that.   On further questioning about the headache, patient reports that she does have family history of migraines.  Patient does not have any family history of brain aneurysm, brain tumor, brain bleeds, sudden unexpected death.   Past Medical History:  Diagnosis Date  . Anemia   . Ejection fraction     Patient Active Problem List   Diagnosis Date Noted  . Chest pain at rest 05/30/2014  . Ejection fraction   . Abnormal EKG 04/24/2014  . Chest discomfort 04/23/2014  . Panic attack 04/23/2014  . OBESITY 01/15/2009  . HAIR LOSS 01/15/2009  . ANEMIA-NOS 01/14/2009    Past Surgical History:  Procedure Laterality Date  . LEFT HEART CATHETERIZATION WITH CORONARY ANGIOGRAM N/A 05/30/2014   Procedure: LEFT HEART CATHETERIZATION WITH CORONARY ANGIOGRAM;  Surgeon: Peter M Martinique, MD;  Location: [facility] CATH LAB;  Service: Cardiovascular;  Laterality: N/A;  . TUBAL LIGATION      OB History    Gravida Para Term Preterm AB Living   8 6 4 2 2 4    SAB TAB Ectopic Multiple Live Births   2                Home Medications    Prior to Admission medications   Medication Sig Start Date End Date Taking? Authorizing Provider  albuterol (PROVENTIL HFA;VENTOLIN HFA) 108 (90 BASE) MCG/ACT inhaler Inhale 2 puffs into the lungs every 6 (six) hours as needed for wheezing or shortness of breath.   Yes [provider]  ibuprofen (ADVIL,MOTRIN) 600 MG tablet Take 1 tablet (600 mg total) by mouth every 6 (six) hours as needed. 09/15/17   Varney Biles, MD    Family History Family History  Problem Relation Age of Onset  . Hypertension Mother   . Diabetes Father   . Hypertension Maternal Aunt   . Hypertension Maternal Uncle   . Cancer Maternal Grandmother   . Hypertension Maternal Grandmother   . Stroke Maternal Grandmother   . Stroke Maternal Grandfather     Social History Social History  Substance Use Topics  . Smoking status: Never Smoker  . Smokeless tobacco: Never Used  . Alcohol use No     Allergies   Patient has no known allergies.   Review of Systems Review of Systems  Constitutional: Negative for activity change.  Musculoskeletal: Negative for neck pain and neck stiffness.  Allergic/Immunologic: Negative for immunocompromised state.  Neurological: Positive for headaches. Negative for dizziness, seizures, facial asymmetry, speech  difficulty, weakness, light-headedness and numbness.  Hematological: Does not bruise/bleed easily.     Physical Exam Updated Vital Signs BP (!) 142/109 (BP Location: Right Arm)   Pulse 82   Temp 98.2 F (36.8 C) (Oral)   Resp 17   SpO2 100%   Physical Exam  Constitutional: She is oriented to person, place, and time. She appears well-developed.  HENT:  Head: Normocephalic and atraumatic.  Eyes: EOM are normal.  Neck: Normal range of motion. Neck supple.  Cardiovascular: Normal rate.   Pulmonary/Chest: Effort normal.  Abdominal: Bowel sounds are normal.  Neurological: She is alert and oriented to person, place, and  time. No cranial nerve deficit or sensory deficit. Coordination normal.  Cerebellar exam is normal (finger to nose) Sensory exam normal for bilateral upper and lower extremities - and patient is able to discriminate between sharp and dull. Motor exam is 4+/5   Skin: Skin is warm and dry.  Nursing note and vitals reviewed.    ED Treatments / Results  Labs (all labs ordered are listed, but only abnormal results are displayed) Labs Reviewed  BASIC METABOLIC PANEL - Abnormal; Notable for the following:       Result Value   Glucose, Bld 106 (*)    All other components within normal limits  CBC WITH DIFFERENTIAL/PLATELET  I-STAT TROPONIN, ED    EKG  EKG Interpretation  Date/Time:  Thursday September 15 2017 18:02:02 EDT Ventricular Rate:  90 PR Interval:  120 QRS Duration: 78 QT Interval:  354 QTC Calculation: 433 R Axis:   14 Text Interpretation:  Normal sinus rhythm ST & T wave abnormality, consider inferior ischemia Abnormal ECG Nonspecific ST and T wave abnormality No acute changes Confirmed by Varney Biles [phone]) on 09/15/2017 10:41:04 PM       Radiology Dg Chest 2 View  Result Date: 09/15/2017 CLINICAL DATA:  Initial evaluation for acute headache. EXAM: CHEST  2 VIEW COMPARISON:  [facility] CT from 10/11/2016. FINDINGS: The cardiac and mediastinal silhouettes are stable in size and contour, and remain within normal limits. The lungs are normally inflated. No airspace consolidation, pleural effusion, or pulmonary edema is identified. There is no pneumothorax. No acute osseous abnormality identified. IMPRESSION: No active cardiopulmonary disease. Electronically Signed   By: Jeannine Boga M.D.   On: 09/15/2017 19:27   Ct Head Wo Contrast  Result Date: 09/15/2017 CLINICAL DATA:  46 year old female with headache. EXAM: CT HEAD WITHOUT CONTRAST TECHNIQUE: Contiguous axial images were obtained from the base of the skull through the vertex without intravenous contrast.  COMPARISON:  Head CT dated 05/26/2012 FINDINGS: Brain: No evidence of acute infarction, hemorrhage, hydrocephalus, extra-axial collection or mass lesion/mass effect. Vascular: No hyperdense vessel or unexpected calcification. Skull: Normal. Negative for fracture or focal lesion. Sinuses/Orbits: No acute finding. There is dysconjugate gaze. Clinical correlation is recommended. Other: None IMPRESSION: No acute intracranial pathology. Electronically Signed   By: Anner Crete M.D.   On: 09/15/2017 20:24    Procedures Procedures (including critical care time)  Medications Ordered in ED Medications  sodium chloride 0.9 % bolus 1,000 mL (1,000 mLs Intravenous Refused 09/15/17 2141)  metoCLOPramide (REGLAN) injection 10 mg (10 mg Intramuscular Given 09/15/17 2141)  naproxen (NAPROSYN) tablet 500 mg (500 mg Oral Given 09/15/17 2141)  diphenhydrAMINE (BENADRYL) capsule 25 mg (25 mg Oral Given 09/15/17 2141)     Initial Impression / Assessment and Plan / ED Course  I have reviewed the triage vital signs and the nursing notes.  Pertinent labs &  imaging results that were available during my care of the patient were reviewed by me and considered in my medical decision making (see chart for details).  Clinical Course as of Sep 15 2240  Thu Sep 15, 2017  2240 Upon reassessment, patient reports that the headache has improved and that she wants to go home. She denies any new neurologic symptoms. Strict return precautions discussed, pt will return to the ER if there is visual complains, seizures, altered mental status, loss of consciousness, dizziness, new focal weakness, or numbness.     [AN]    Clinical Course User Index [AN] Varney Biles, MD    Patient comes to the ER with main complaint of headache.  Patient specifically request [place] to focus on the headache.  Patient had some cough and near syncope-like episode while she was at home but she does not have any chest pain or shortness of breath at the  moment.  Chest x-ray is negative, lung exam is normal.  EKG has no acute changes and initial troponin is negative.  No further cardiac workup indicated.  Patient's headache is right-sided, throbbing in nature and she has some tingling in her left upper extremity.  Patient on exam has no focal neurologic deficits and she even denies any subjective numbness to the left upper extremity on her neuro exam.  Patient has no heavy risk factors for stroke.  CT scan of the head was done and the triage and is negative for any kind of brain bleeds.  Patient declines any IV meds, so we will give her IM Reglan oral Naprosyn and then reassess.  Final Clinical Impressions(s) / ED Diagnoses   Final diagnoses:  Post-traumatic headache, not intractable, unspecified chronicity pattern  Severe headache    New Prescriptions New Prescriptions   IBUPROFEN (ADVIL,MOTRIN) 600 MG TABLET    Take 1 tablet (600 mg total) by mouth every 6 (six) hours as needed.     Varney Biles, MD 09/15/17 2245

## 2018-04-29 ENCOUNTER — Emergency Department (HOSPITAL_COMMUNITY): Payer: Medicaid Other

## 2018-04-29 ENCOUNTER — Encounter (HOSPITAL_COMMUNITY): Payer: Self-pay | Admitting: Emergency Medicine

## 2018-04-29 ENCOUNTER — Emergency Department (HOSPITAL_COMMUNITY)
Admission: EM | Admit: 2018-04-29 | Discharge: 2018-04-29 | Disposition: A | Discharge status: Home / Self Care | Payer: Medicaid Other | Attending: Emergency Medicine | Admitting: Emergency Medicine

## 2018-04-29 DIAGNOSIS — R079 Chest pain, unspecified: Secondary | ICD-10-CM | POA: Diagnosis present

## 2018-04-29 DIAGNOSIS — M5489 Other dorsalgia: Secondary | ICD-10-CM | POA: Insufficient documentation

## 2018-04-29 DIAGNOSIS — L910 Hypertrophic scar: Secondary | ICD-10-CM | POA: Diagnosis not present

## 2018-04-29 DIAGNOSIS — Z79899 Other long term (current) drug therapy: Secondary | ICD-10-CM | POA: Diagnosis not present

## 2018-04-29 DIAGNOSIS — R1012 Left upper quadrant pain: Secondary | ICD-10-CM | POA: Insufficient documentation

## 2018-04-29 DIAGNOSIS — R0789 Other chest pain: Secondary | ICD-10-CM

## 2018-04-29 LAB — BASIC METABOLIC PANEL
ANION GAP: 10 (ref 5–15)
BUN: 7 mg/dL (ref 6–20)
CHLORIDE: 109 mmol/L (ref 101–111)
CO2: 23 mmol/L (ref 22–32)
Calcium: 9.3 mg/dL (ref 8.9–10.3)
Creatinine, Ser: 0.95 mg/dL (ref 0.44–1.00)
GFR calc non Af Amer: >60 mL/min (ref >60)
Glucose, Bld: 96 mg/dL (ref 65–99)
POTASSIUM: 3.8 mmol/L (ref 3.5–5.1)
SODIUM: 142 mmol/L (ref 135–145)

## 2018-04-29 LAB — URINALYSIS, ROUTINE W REFLEX MICROSCOPIC
BILIRUBIN URINE: NEGATIVE
Bacteria, UA: NONE SEEN
Glucose, UA: NEGATIVE mg/dL
Ketones, ur: NEGATIVE mg/dL
LEUKOCYTES UA: NEGATIVE
NITRITE: NEGATIVE
PH: 5 (ref 5.0–8.0)
Protein, ur: NEGATIVE mg/dL
SPECIFIC GRAVITY, URINE: 1.013 (ref 1.005–1.030)

## 2018-04-29 LAB — CBC
HEMATOCRIT: 40.4 % (ref 36.0–46.0)
HEMOGLOBIN: 12.9 g/dL (ref 12.0–15.0)
MCH: 28.5 pg (ref 26.0–34.0)
MCHC: 31.9 g/dL (ref 30.0–36.0)
MCV: 89.2 fL (ref 78.0–100.0)
Platelets: 256 10*3/uL (ref 150–400)
RBC: 4.53 MIL/uL (ref 3.87–5.11)
RDW: 12.5 % (ref 11.5–15.5)
WBC: 3.3 10*3/uL — AB (ref 4.0–10.5)

## 2018-04-29 LAB — I-STAT TROPONIN, ED: Troponin i, poc: 0 ng/mL (ref 0.00–0.08)

## 2018-04-29 LAB — I-STAT BETA HCG BLOOD, ED (MC, WL, AP ONLY)

## 2018-04-29 MED ORDER — DICLOFENAC SODIUM 1 % TD GEL: 2.0000 g | Freq: Four times a day (QID) | TRANSDERMAL | 0 refills | Status: DC

## 2018-04-29 MED ORDER — GI COCKTAIL ~~LOC~~
30.0000 mL | Freq: Once | ORAL | Status: AC
  Administered 2018-04-29: 30 mL via ORAL
  Filled 2018-04-29: qty 30

## 2018-04-29 NOTE — Discharge Instructions (Signed)
You can take Tylenol or Ibuprofen as directed for pain. You can alternate Tylenol and Ibuprofen every 4 hours. If you take Tylenol at 1pm, then you can take Ibuprofen at 5pm. Then you can take Tylenol again at 9pm.   You can use the voltaren gel as directed.   Follow-up with your primary care doctor or the [facility] for further evaluation.  Return to the Emergency Department immediately for any worsening back pain, neck pain, difficulty walking, numbness/weaknss of your arms or legs, urinary or bowel accidents, fever or any other worsening or concerning symptoms.

## 2018-04-29 NOTE — ED Notes (Signed)
Pt able to ambulated independently

## 2018-04-29 NOTE — ED Triage Notes (Signed)
Patient to ED c/o superficial burning chest pain x 1 month - has keloids on her chest that have been there for years but just now acting up. Also c/o back pain, urgency, and frequency. Denies fevers/chills, SOB, dizziness. Resp e/u, skin warm/dry.

## 2018-04-29 NOTE — ED Provider Notes (Signed)
[facility] EMERGENCY DEPARTMENT Provider Note   CSN: [id number] Arrival date & time: 04/29/18  1144     History   Chief Complaint Chief Complaint  Patient presents with  . Chest Pain  . Back Pain    HPI QUETZALY EBNER is a 47 y.o. female history of anemia, chest discomfort, anxiety who presents for evaluation of chest pain that is been intermittently ongoing for the last month.  Patient states it is a burning type of pain that radiates across her anterior chest.  She reports that she has keloids noted to the anterior aspect of her chest and feels like they are pulling and stretching.  Patient states that she does not get diaphoretic or nauseous with this pain.  She states that the pain is not worse with exertion.  She reports that sometimes it is worse with deep inspiration.  She states it is not brought on by any particular activity but states that it occurs at random.  Patient reports she has not tried anything for the pain.  She has not sought evaluation for this pain.  Additionally, patient reports some lower back pain, urinary urgency, frequency that has been ongoing for last month that is worse in the last few days.  Denies any dysuria, hematuria.  Reports that she has not had any trauma, injury, fall to the back.  Patient reports that she has been evaluated by cardiologist and had a cath done in 2015.  Patient states that she has had no other cardiac history.  Patient dates her mom had a heart attack in her 81s but otherwise denies any history of family heart attacks at early ages.  She is not a current smoker.  Denies any cocaine use.  Patient denies any fevers, vision changes, difficulty breathing, abdominal pain, nausea/vomiting, numbness/weakness of her arms or legs, saddle anesthesia, urinary or bowel incontinence, history of IV drug use, vaginal bleeding. She denies any OCP use, recent immobilization, prior history of DVT/PE, recent surgery, leg swelling, or long  travel.  The history is provided by the patient.    Past Medical History:  Diagnosis Date  . Anemia   . Ejection fraction     Patient Active Problem List   Diagnosis Date Noted  . Chest pain at rest 05/30/2014  . Ejection fraction   . Abnormal EKG 04/24/2014  . Chest discomfort 04/23/2014  . Panic attack 04/23/2014  . OBESITY 01/15/2009  . HAIR LOSS 01/15/2009  . ANEMIA-NOS 01/14/2009    Past Surgical History:  Procedure Laterality Date  . LEFT HEART CATHETERIZATION WITH CORONARY ANGIOGRAM N/A 05/30/2014   Procedure: LEFT HEART CATHETERIZATION WITH CORONARY ANGIOGRAM;  Surgeon: Peter M Martinique, MD;  Location: [facility] CATH LAB;  Service: Cardiovascular;  Laterality: N/A;  . TUBAL LIGATION       OB History    Gravida  8   Para  6   Term  4   Preterm  2   AB  2   Living  4     SAB  2   TAB      Ectopic      Multiple      Live Births               Home Medications    Prior to Admission medications   Medication Sig Start Date End Date Taking? Authorizing Provider  albuterol (PROVENTIL HFA;VENTOLIN HFA) 108 (90 BASE) MCG/ACT inhaler Inhale 2 puffs into the lungs every 6 (six) hours as  needed for wheezing or shortness of breath.    [provider]  diclofenac sodium (VOLTAREN) 1 % GEL Apply 2 g topically 4 (four) times daily. 04/29/18   Volanda Napoleon, [name]  ibuprofen (ADVIL,MOTRIN) 600 MG tablet Take 1 tablet (600 mg total) by mouth every 6 (six) hours as needed. 09/15/17   Varney Biles, MD    Family History Family History  Problem Relation Age of Onset  . Hypertension Mother   . Diabetes Father   . Hypertension Maternal Aunt   . Hypertension Maternal Uncle   . Cancer Maternal Grandmother   . Hypertension Maternal Grandmother   . Stroke Maternal Grandmother   . Stroke Maternal Grandfather     Social History Social History   Tobacco Use  . Smoking status: Never Smoker  . Smokeless tobacco: Never Used  Substance Use Topics  .  Alcohol use: No  . Drug use: No     Allergies   Patient has no known allergies.   Review of Systems Review of Systems  Constitutional: Negative for fever.  Respiratory: Negative for cough and shortness of breath.   Cardiovascular: Positive for chest pain.  Gastrointestinal: Negative for abdominal pain, nausea and vomiting.  Genitourinary: Negative for dysuria and hematuria.  Musculoskeletal: Positive for back pain.  Neurological: Negative for headaches.  All other systems reviewed and are negative.    Physical Exam Updated Vital Signs BP 115/80   Pulse 61   Temp 98.7 F (37.1 C) (Oral)   Resp 15   LMP 04/03/2018 (Within Days)   SpO2 97%   Physical Exam  Constitutional: She is oriented to person, place, and time. She appears well-developed and well-nourished.  HENT:  Head: Normocephalic and atraumatic.  Mouth/Throat: Oropharynx is clear and moist and mucous membranes are normal.  Eyes: Pupils are equal, round, and reactive to light. Conjunctivae, EOM and lids are normal.  Neck: Full passive range of motion without pain.  Cardiovascular: Normal rate, regular rhythm, normal heart sounds and normal pulses. Exam reveals no gallop and no friction rub.  No murmur heard. Pulses:      Radial pulses are 2+ on the right side, and 2+ on the left side.       Dorsalis pedis pulses are 2+ on the right side, and 2+ on the left side.  Pulmonary/Chest: Effort normal and breath sounds normal.  Lungs clear to auscultation bilaterally.  Symmetric chest rise.  No wheezing, rales, rhonchi.  Extensive keloids across the anterior aspect of chest.  No overlying warmth, erythema.  Pain is reproduced with palpation and with movement of her upper extremities.  Abdominal: Soft. Normal appearance. There is no tenderness. There is CVA tenderness. There is no rigidity and no guarding.  Abdomen is soft, non-distended, non-tender. No rigidity, No guarding. No peritoneal signs. Left sided CVA tenderness  present.   Musculoskeletal: Normal range of motion.  Neurological: She is alert and oriented to person, place, and time.  Skin: Skin is warm and dry. Capillary refill takes less than 2 seconds.  Psychiatric: She has a normal mood and affect. Her speech is normal.  Nursing note and vitals reviewed.    ED Treatments / Results  Labs (all labs ordered are listed, but only abnormal results are displayed) Labs Reviewed  CBC - Abnormal; Notable for the following components:      Result Value   WBC 3.3 (*)    All other components within normal limits  URINALYSIS, ROUTINE W REFLEX MICROSCOPIC - Abnormal; Notable  for the following components:   APPearance HAZY (*)    Hgb urine dipstick SMALL (*)    All other components within normal limits  BASIC METABOLIC PANEL  I-STAT TROPONIN, ED  I-STAT BETA HCG BLOOD, ED ([facility], [facility], [facility] ONLY)    EKG EKG Interpretation  Date/Time:  Saturday April 29 2018 11:49:43 EDT Ventricular Rate:  72 PR Interval:  130 QRS Duration: 76 QT Interval:  368 QTC Calculation: 402 R Axis:   18 Text Interpretation:  Normal sinus rhythm Nonspecific T wave abnormality No significant change since last tracing Confirmed by Lajean Saver [phone]) on 04/29/2018 3:55:00 PM   Radiology Dg Chest 2 View  Result Date: 04/29/2018 CLINICAL DATA:  Central chest pain.  Shortness of breath. EXAM: CHEST - 2 VIEW COMPARISON:  09/15/2017. FINDINGS: Trachea is midline. Heart size normal. Lungs are clear. No pleural fluid. IMPRESSION: Negative. Electronically Signed   By: Lorin Picket M.D.   On: 04/29/2018 12:19   Ct Renal Stone Study  Result Date: 04/29/2018 CLINICAL DATA:  Low back pain and progressive urinary urgency and frequency. EXAM: CT ABDOMEN AND PELVIS WITHOUT CONTRAST TECHNIQUE: Multidetector CT imaging of the abdomen and pelvis was performed following the standard protocol without IV contrast. COMPARISON:  CT AP 5/6/7 FINDINGS: Lower chest: No acute abnormality. Hepatobiliary:  Lesion involving the inferior aspect of right lobe of liver with multiple rounded calcifications are again noted and appear unchanged from comparison exam compatible with a benign abnormality. Gallbladder appears normal. No biliary dilatation. Pancreas: Unremarkable. No pancreatic ductal dilatation or surrounding inflammatory changes. Spleen: Normal in size without focal abnormality. Adrenals/Urinary Tract: Normal appearance of the adrenal glands. No kidney stones or hydronephrosis identified. No ureteral calculi noted. Small exophytic lesion arising from the posterolateral cortex of the interpolar left kidney measures 1.2 cm. This is incompletely characterized without IV contrast. Urinary bladder appears normal. Stomach/Bowel: The stomach is normal. The small bowel loops have a normal course and caliber. No bowel obstruction. Unremarkable appearance of the colon. Vascular/Lymphatic: Normal appearance of the abdominal aorta. No enlarged retroperitoneal or mesenteric adenopathy. No enlarged pelvic or inguinal lymph nodes. Reproductive: The uterus and adnexal structures are unremarkable. Other: No free fluid or fluid collections identified within the abdomen or pelvis. Musculoskeletal: Unremarkable appearance of the colon. IMPRESSION: 1. No acute findings within the abdomen or pelvis. No urinary tract calculi noted. 2. Stable calcified lesion arising from the inferior aspect of the right lobe of liver. Electronically Signed   By: Kerby Moors M.D.   On: 04/29/2018 15:43    Procedures Procedures (including critical care time)  Medications Ordered in ED Medications  gi cocktail (Maalox,Lidocaine,Donnatal) (30 mLs Oral Given 04/29/18 1254)     Initial Impression / Assessment and Plan / ED Course  I have reviewed the triage vital signs and the nursing notes.  Pertinent labs & imaging results that were available during my care of the patient were reviewed by me and considered in my medical decision making  (see chart for details).     47 year old female who presents for evaluation of chest pain that is been ongoing for the last month.  Reports it is an anterior burning sensation across the anterior aspect of her chest.  Feels like it is associated with the pulling of her keloids.  Not worse with exertion, intermittently worse with deep inspiration.  No associated diaphoresis, nausea, vomiting, difficulty breathing.  No fevers.  No history of high blood pressure, diabetes.  No history of heart attacks.  She states that she was having chest pain and had an abnormal EKG and was evaluated by cardiology.  She had a cardiac cath in 2015 that was normal.  Otherwise no other cardiac history.  Reports mom had heart attack in her 54s.  No other family ab normalities.  Consider infectious etiology versus ACS etiology versus GERD versus keloid associated pain.  Low suspicion for ACS etiology given history/physical exam.  History/physical exam is not concerning for PE.  Patient is PERC negative.  Initial labs ordered at triage.  CBC shows leukopenia of 3.3.  Hemoglobin is 12.9, hematocrit is 40.4.  Otherwise unremarkable.  I-STAT beta negative.  I-STAT troponin is negative.  BMP is negative for any acute abnormality.  UA shows small amount of hemoglobin.  No other acute abnormalities.  Given CVA tenderness and hemoglobin on urine, will plan for evaluation of kidney stone, though low suspicion.  Chest x-ray negative for any acute infectious etiology.  Given that patient's pain has been ongoing for the last month and is more related to keloids rather than cardiac in nature, one troponin is sufficient for rule out. Discussed patient with Dr. Ashok Cordia who agrees.   CT renal study shows no kidney stones.  No other acute abnormalities.  Discussed results with patient.  Vital signs are stable at this time.  At this time, I do not believe patient's pain to be cardiac in nature.  Pain is reproduced to palpation, reproduced with  movement of arms, is described as a burning to the keloid does not sound suspicious for ACS etiology.  Instructed patient to follow-up with primary care doctor for further evaluation. Patient had ample opportunity for questions and discussion. All patient's questions were answered with full understanding. Strict return precautions discussed. Patient expresses understanding and agreement to plan.    Final Clinical Impressions(s) / ED Diagnoses   Final diagnoses:  Keloid of skin  Atypical chest pain    ED Discharge Orders        Ordered    diclofenac sodium (VOLTAREN) 1 % GEL  4 times daily     04/29/18 1601       Volanda Napoleon, [name] 04/30/18 6384    Lajean Saver, MD 05/01/18 1942

## 2018-04-29 NOTE — ED Notes (Signed)
This RN made patient aware of need for urine sample.  Patient states she is unable to go at this time.  This RN asked patient if she would be willing to go to the bathroom to try, and patient states "no thank you.  I'll call you when I can go".  This RN made patient aware of possible delay in care without sample.  Pt verbalized understanding

## 2018-04-29 NOTE — ED Notes (Signed)
Pt transported to CT ?

## 2018-09-01 ENCOUNTER — Other Ambulatory Visit: Payer: Self-pay

## 2018-09-01 ENCOUNTER — Encounter (HOSPITAL_COMMUNITY): Payer: Self-pay | Admitting: Emergency Medicine

## 2018-09-01 ENCOUNTER — Ambulatory Visit (HOSPITAL_COMMUNITY)
Admission: EM | Admit: 2018-09-01 | Discharge: 2018-09-01 | Disposition: A | Discharge status: Home / Self Care | Payer: Medicaid Other | Attending: Internal Medicine | Admitting: Internal Medicine

## 2018-09-01 DIAGNOSIS — N898 Other specified noninflammatory disorders of vagina: Secondary | ICD-10-CM

## 2018-09-01 DIAGNOSIS — R030 Elevated blood-pressure reading, without diagnosis of hypertension: Secondary | ICD-10-CM | POA: Insufficient documentation

## 2018-09-01 DIAGNOSIS — Z79899 Other long term (current) drug therapy: Secondary | ICD-10-CM | POA: Diagnosis not present

## 2018-09-01 LAB — POCT URINALYSIS DIP (DEVICE)
BILIRUBIN URINE: NEGATIVE
Glucose, UA: NEGATIVE mg/dL
KETONES UR: NEGATIVE mg/dL
Leukocytes, UA: NEGATIVE
Nitrite: NEGATIVE
Protein, ur: NEGATIVE mg/dL
Specific Gravity, Urine: 1.015 (ref 1.005–1.030)
Urobilinogen, UA: 1 mg/dL (ref 0.0–1.0)
pH: 5.5 (ref 5.0–8.0)

## 2018-09-01 NOTE — ED Provider Notes (Signed)
[facility]    CSN: [id number] Arrival date & time: 09/01/18  1911     History   Chief Complaint Chief Complaint  Patient presents with  . Vaginal Discharge    HPI Sheila [name] is a 47 y.o. female.   Onset of increased vaginal discharge x 1 week, the discharge is slightly yellow and thin, but does not have an order. Denies any vaginal sores or pain when she wipes. Has burning on the vaginal area when the urine touches. She and her husband denies outside marriage sexual encounters. She just wants me to do vaginal swab testing. Her husband was in the room during interview and exam.      Past Medical History:  Diagnosis Date  . Anemia   . Ejection fraction     Patient Active Problem List   Diagnosis Date Noted  . Chest pain at rest 05/30/2014  . Ejection fraction   . Abnormal EKG 04/24/2014  . Chest discomfort 04/23/2014  . Panic attack 04/23/2014  . OBESITY 01/15/2009  . HAIR LOSS 01/15/2009  . ANEMIA-NOS 01/14/2009    Past Surgical History:  Procedure Laterality Date  . LEFT HEART CATHETERIZATION WITH CORONARY ANGIOGRAM N/A 05/30/2014   Procedure: LEFT HEART CATHETERIZATION WITH CORONARY ANGIOGRAM;  Surgeon: Peter M Martinique, MD;  Location: [facility] CATH LAB;  Service: Cardiovascular;  Laterality: N/A;  . TUBAL LIGATION      OB History    Gravida  8   Para  6   Term  4   Preterm  2   AB  2   Living  4     SAB  2   TAB      Ectopic      Multiple      Live Births               Home Medications    Prior to Admission medications   Medication Sig Start Date End Date Taking? Authorizing Provider  albuterol (PROVENTIL HFA;VENTOLIN HFA) 108 (90 BASE) MCG/ACT inhaler Inhale 2 puffs into the lungs every 6 (six) hours as needed for wheezing or shortness of breath.    [provider]    Family History Family History  Problem Relation Age of Onset  . Hypertension Mother   . Diabetes Father   . Hypertension Maternal Aunt   .  Hypertension Maternal Uncle   . Cancer Maternal Grandmother   . Hypertension Maternal Grandmother   . Stroke Maternal Grandmother   . Stroke Maternal Grandfather     Social History Social History   Tobacco Use  . Smoking status: Never Smoker  . Smokeless tobacco: Never Used  Substance Use Topics  . Alcohol use: No  . Drug use: No     Allergies   Patient has no known allergies.   Review of Systems Review of Systems  Constitutional: Negative for chills and fever.  Gastrointestinal: Negative for abdominal distention and abdominal pain.  Genitourinary: Positive for vaginal discharge. Negative for difficulty urinating, dyspareunia, dysuria, frequency, genital sores, pelvic pain, urgency, vaginal bleeding and vaginal pain.  Skin: Negative for rash.  Psychiatric/Behavioral:       Has been stressed     Physical Exam Triage Vital Signs ED Triage Vitals  Enc Vitals Group     BP 09/01/18 1943 (!) 138/98     Pulse Rate 09/01/18 1943 78     Resp --      Temp 09/01/18 1943 98.3 F (36.8 C)  Temp Source 09/01/18 1943 Oral     SpO2 09/01/18 1943 100 %     Weight --      Height --      Head Circumference --      Peak Flow --      Pain Score 09/01/18 1944 6     Pain Loc --      Pain Edu? --      Excl. in [place]? --    No data found.  Updated Vital Signs BP (!) 138/98 (BP Location: Right Wrist)   Pulse 78   Temp 98.3 F (36.8 C) (Oral)   SpO2 100%   Visual Acuity Right Eye Distance:   Left Eye Distance:   Bilateral Distance:    Right Eye Near:   Left Eye Near:    Bilateral Near:     Physical Exam  Constitutional: She is oriented to person, place, and time. She appears well-developed and well-nourished. No distress.  HENT:  Head: Normocephalic.  Nose: Nose normal.  Eyes: Conjunctivae are normal. No scleral icterus.  Neck: Neck supple.  Cardiovascular: Normal rate and regular rhythm.  No murmur heard. Pulmonary/Chest: Effort normal.  Genitourinary: Uterus  normal. Vaginal discharge found.  Genitourinary Comments: Has a light white thin discharge with slight fish odor. Bimanual exam was negative. NO adnexal tenderness or masses noted.   Musculoskeletal: She exhibits no edema.  Neurological: She is alert and oriented to person, place, and time.  Skin: Skin is warm and dry. She is not diaphoretic.  Psychiatric: She has a normal mood and affect. Her behavior is normal. Judgment and thought content normal.  Vitals reviewed.    UC Treatments / Results  Labs (all labs ordered are listed, but only abnormal results are displayed) Labs Reviewed  POCT URINALYSIS DIP (DEVICE) - Abnormal; Notable for the following components:      Result Value   Hgb urine dipstick TRACE (*)    All other components within normal limits  CERVICOVAGINAL ANCILLARY ONLY    EKG None  Radiology No results found.  Procedures Pelvic exam  Medications Ordered in UC Medications - No data to display  Initial Impression / Assessment and Plan / UC Course  I have reviewed the triage vital signs and the nursing notes. Vaginal Discharge- I sent out vaginitis swab and we will call her when the results are back. She was advised to have her daughter who is a nurse to do BP diaries 2-3 times a week and take those readings to her future PCP since her prior PCP retired. But she can get established with his partner.    Clinical Course as of Sep 02 2055  Fri Sep 01, 2018  2050 Repeated vitals at 8:45 pm before she left and was 143/86   [SR]    Clinical Course User Index [SR] Rodriguez-Southworth, Sunday Spillers, [name]   UA had trace blood, the rest neg.   Final Clinical Impressions(s) / UC Diagnoses   Final diagnoses:  Vaginal discharge  Transient elevated blood pressure     Discharge Instructions     I will send the vaginal swabs to be tested for yeast, bacterial vaginitis, trichomonas, gonorrhea and chlamydia. Ones we hear back from the results we will call you and  treat you if any of them are positive.     ED Prescriptions    None     Controlled Substance Prescriptions [place] Controlled Substance Registry consulted?    Sheila [name], [place] 09/01/18 2058

## 2018-09-01 NOTE — Discharge Instructions (Addendum)
I will send the vaginal swabs to be tested for yeast, bacterial vaginitis, trichomonas, gonorrhea and chlamydia. Ones we hear back from the results we will call you and treat you if any of them are positive.

## 2018-09-01 NOTE — ED Triage Notes (Signed)
Pt reports vaginal discharge and discomfort x1 week with no other urinary symptoms.

## 2018-09-04 LAB — CERVICOVAGINAL ANCILLARY ONLY
Bacterial vaginitis: POSITIVE — AB
CHLAMYDIA, DNA PROBE: NEGATIVE
Candida vaginitis: NEGATIVE
Neisseria Gonorrhea: NEGATIVE
Trichomonas: NEGATIVE

## 2018-09-05 ENCOUNTER — Telehealth: Payer: Self-pay | Admitting: Emergency Medicine

## 2018-09-05 MED ORDER — METRONIDAZOLE 500 MG PO TABS: 500.0000 mg | ORAL_TABLET | Freq: Two times a day (BID) | ORAL | 0 refills | Status: AC

## 2018-09-05 NOTE — Telephone Encounter (Signed)
Bacterial vaginosis is positive. This was not treated at the urgent care visit.  Flagyl 500 mg BID x 7 days #14 no refills sent to patients pharmacy of choice.  Called patient, number out of service, mailing letter.

## 2018-09-13 ENCOUNTER — Telehealth: Payer: Self-pay | Admitting: Emergency Medicine

## 2018-09-13 NOTE — Telephone Encounter (Signed)
Patient returned call is aware

## 2019-03-17 ENCOUNTER — Ambulatory Visit (INDEPENDENT_AMBULATORY_CARE_PROVIDER_SITE_OTHER): Payer: Medicaid Other

## 2019-03-17 ENCOUNTER — Other Ambulatory Visit: Payer: Self-pay

## 2019-03-17 ENCOUNTER — Ambulatory Visit (HOSPITAL_COMMUNITY)
Admission: EM | Admit: 2019-03-17 | Discharge: 2019-03-17 | Disposition: A | Discharge status: Home / Self Care | Payer: Medicaid Other | Attending: Family Medicine | Admitting: Family Medicine

## 2019-03-17 DIAGNOSIS — M25551 Pain in right hip: Secondary | ICD-10-CM | POA: Diagnosis present

## 2019-03-17 DIAGNOSIS — K5900 Constipation, unspecified: Secondary | ICD-10-CM | POA: Diagnosis not present

## 2019-03-17 DIAGNOSIS — R102 Pelvic and perineal pain: Secondary | ICD-10-CM | POA: Diagnosis present

## 2019-03-17 DIAGNOSIS — M545 Low back pain, unspecified: Secondary | ICD-10-CM

## 2019-03-17 LAB — POCT URINALYSIS DIP (DEVICE)
Bilirubin Urine: NEGATIVE
Glucose, UA: NEGATIVE mg/dL
Hgb urine dipstick: NEGATIVE
Ketones, ur: NEGATIVE mg/dL
Leukocytes,Ua: NEGATIVE
Nitrite: NEGATIVE
Protein, ur: NEGATIVE mg/dL
Specific Gravity, Urine: 1.02 (ref 1.005–1.030)
Urobilinogen, UA: 2 mg/dL — ABNORMAL HIGH (ref 0.0–1.0)
pH: 7 (ref 5.0–8.0)

## 2019-03-17 MED ORDER — DOCUSATE SODIUM 50 MG PO CAPS: 100.0000 mg | ORAL_CAPSULE | Freq: Every day | ORAL | 0 refills | Status: DC | PRN

## 2019-03-17 MED ORDER — KETOROLAC TROMETHAMINE 30 MG/ML IJ SOLN
INTRAMUSCULAR | Status: AC
  Filled 2019-03-17: qty 1

## 2019-03-17 MED ORDER — MELOXICAM 15 MG PO TABS: 15.0000 mg | ORAL_TABLET | Freq: Every day | ORAL | 1 refills | Status: DC

## 2019-03-17 MED ORDER — POLYETHYLENE GLYCOL 3350 17 G PO PACK: 17.0000 g | PACK | Freq: Every day | ORAL | 0 refills | Status: DC | PRN

## 2019-03-17 MED ORDER — KETOROLAC TROMETHAMINE 60 MG/2ML IM SOLN
60.0000 mg | Freq: Once | INTRAMUSCULAR | Status: AC
  Administered 2019-03-17: 60 mg via INTRAMUSCULAR

## 2019-03-17 NOTE — ED Triage Notes (Signed)
Per pt she has been having abdominal pain and right sided back and side pain. Pt also stated she has had sexual intercourse with husband after 6 months and started having these symptoms 3 days afterwards

## 2019-03-17 NOTE — ED Provider Notes (Signed)
[place]    CSN: [id number] Arrival date & time: 03/17/19  1222     History   Chief Complaint Chief Complaint  Patient presents with  . Abdominal Pain  . Back Pain    HPI Sheila [name] is a 48 y.o. female.   HPI  Sheila [name] reports initially developing right hip pain x 1 week. Over the last few days, pain has developed in the lower pelvic region bilaterally, bilateral lower back, and she feels bloated. Endorses one day of nausea without vomiting. She is not experiencing dysuria or urine frequency. Reports a distant history of renal stones. Denies hematuria. She has not taken any medication for symptoms. Recently had sexual intercourse prior to developing pelvic pain 3 days ago and had not recently been active. Denies chills, fever, or any other body aches. No chance of pregnancy, history of tubal ligation several years ago.  Past Medical History:  Diagnosis Date  . Anemia   . Ejection fraction     Patient Active Problem List   Diagnosis Date Noted  . Chest pain at rest 05/30/2014  . Ejection fraction   . Abnormal EKG 04/24/2014  . Chest discomfort 04/23/2014  . Panic attack 04/23/2014  . OBESITY 01/15/2009  . HAIR LOSS 01/15/2009  . ANEMIA-NOS 01/14/2009    Past Surgical History:  Procedure Laterality Date  . LEFT HEART CATHETERIZATION WITH CORONARY ANGIOGRAM N/A 05/30/2014   Procedure: LEFT HEART CATHETERIZATION WITH CORONARY ANGIOGRAM;  Surgeon: Peter M Martinique, MD;  Location: [facility] CATH LAB;  Service: Cardiovascular;  Laterality: N/A;  . TUBAL LIGATION      OB History    Gravida  8   Para  6   Term  4   Preterm  2   AB  2   Living  4     SAB  2   TAB      Ectopic      Multiple      Live Births               Home Medications    Prior to Admission medications   Medication Sig Start Date End Date Taking? Authorizing Provider  albuterol (PROVENTIL HFA;VENTOLIN HFA) 108 (90 BASE) MCG/ACT inhaler Inhale 2 puffs into the lungs every  6 (six) hours as needed for wheezing or shortness of breath.    [provider]    Family History Family History  Problem Relation Age of Onset  . Hypertension Mother   . Diabetes Father   . Hypertension Maternal Aunt   . Hypertension Maternal Uncle   . Cancer Maternal Grandmother   . Hypertension Maternal Grandmother   . Stroke Maternal Grandmother   . Stroke Maternal Grandfather     Social History Social History   Tobacco Use  . Smoking status: Never Smoker  . Smokeless tobacco: Never Used  Substance Use Topics  . Alcohol use: No  . Drug use: No     Allergies   Patient has no known allergies.   Review of Systems Review of Systems See HPI  Physical Exam Triage Vital Signs ED Triage Vitals  Enc Vitals Group     BP 03/17/19 1248 (!) 138/94     Pulse Rate 03/17/19 1248 70     Resp 03/17/19 1248 16     Temp --      Temp Source 03/17/19 1248 Oral     SpO2 03/17/19 1248 99 %     Weight --  Height --      Head Circumference --      Peak Flow --      Pain Score 03/17/19 1247 8     Pain Loc --      Pain Edu? --      Excl. in [place]? --    No data found.  Updated Vital Signs BP (!) 138/94 (BP Location: Right Arm)   Pulse 70   Resp 16   SpO2 99%   Visual Acuity Right Eye Distance:   Left Eye Distance:   Bilateral Distance:    Right Eye Near:   Left Eye Near:    Bilateral Near:     Physical Exam Constitutional:      General: She is in acute distress.     Comments: Complaining of persistent pain in back and abdomen  HENT:     Head: Normocephalic and atraumatic.  Cardiovascular:     Rate and Rhythm: Normal rate and regular rhythm.     Heart sounds: Normal heart sounds.  Pulmonary:     Effort: Pulmonary effort is normal.     Breath sounds: Normal breath sounds.  Abdominal:     General: Bowel sounds are normal.     Tenderness: There is abdominal tenderness in the right lower quadrant, suprapubic area and left lower quadrant. There is  right CVA tenderness and left CVA tenderness.     Hernia: No hernia is present.  Skin:    General: Skin is warm.  Neurological:     General: No focal deficit present.  Psychiatric:        Mood and Affect: Mood is anxious.    UC Treatments / Results  Labs (all labs ordered are listed, but only abnormal results are displayed) Labs Reviewed - No data to display  EKG None  Radiology No results found.  Procedures Procedures (including critical care time)  Medications Ordered in UC Medications - No data to display  Initial Impression / Assessment and Plan / UC Course  I have reviewed the triage vital signs and the nursing notes.  Pertinent labs & imaging results that were available during my care of the patient were reviewed by me and considered in my medical decision making (see chart for details).   Imaging only significant for constipation. Recommending Miralax to reduce stool burden. UA negative. Will recommend Meloxicam to manage back, hip, and pelvic pain. If symptoms persists, recommend follow-up at the ER for further evaluation and testing. Patient verbalized agreement and understanding of plan.  Final Clinical Impressions(s) / UC Diagnoses   Final diagnoses:  Acute bilateral low back pain without sciatica  Pelvic pain in female  Right hip pain  Constipation, unspecified constipation type     Discharge Instructions     Meloxicam take once daily as needed for back, hip, or pelvic pain. Start tomorrow as you have already received Toradol injection here in office.  Imaging indicated you are constipated. Increase intake of water. I am sending over Miralax to drink for the next 3 days or until stool is less firm.  I am also sending over a stool softener which I recommend taking daily until constipation resolves.    ED Prescriptions    Medication Sig Dispense Auth. Provider   meloxicam (MOBIC) 15 MG tablet Take 1 tablet (15 mg total) by mouth daily. 30 tablet  Scot Jun, FNP   docusate sodium (COLACE) 50 MG capsule Take 2 capsules (100 mg total) by mouth daily as needed for  mild constipation or moderate constipation. 30 capsule Scot Jun, FNP   polyethylene glycol (MIRALAX / GLYCOLAX) 17 g packet Take 17 g by mouth daily as needed. Harvest, FNP     Controlled Substance Prescriptions [place] Controlled Substance Registry consulted? Not Applicable   Scot Jun, FNP 03/17/19 1456

## 2019-03-17 NOTE — Discharge Instructions (Addendum)
Meloxicam take once daily as needed for back, hip, or pelvic pain. Start tomorrow as you have already received Toradol injection here in office.  Imaging indicated you are constipated. Increase intake of water. I am sending over Miralax to drink for the next 3 days or until stool is less firm.  I am also sending over a stool softener which I recommend taking daily until constipation resolves.

## 2019-03-18 LAB — URINE CULTURE: Culture: <10000 — AB

## 2019-08-03 ENCOUNTER — Encounter (HOSPITAL_COMMUNITY): Payer: Self-pay | Admitting: Family Medicine

## 2019-08-03 ENCOUNTER — Ambulatory Visit (HOSPITAL_COMMUNITY)
Admission: EM | Admit: 2019-08-03 | Discharge: 2019-08-03 | Disposition: A | Discharge status: Home / Self Care | Payer: Medicaid Other | Attending: Family Medicine | Admitting: Family Medicine

## 2019-08-03 ENCOUNTER — Other Ambulatory Visit: Payer: Self-pay

## 2019-08-03 DIAGNOSIS — M436 Torticollis: Secondary | ICD-10-CM

## 2019-08-03 MED ORDER — PREDNISONE 50 MG PO TABS: ORAL_TABLET | ORAL | 0 refills | Status: DC

## 2019-08-03 MED ORDER — HYDROCODONE-ACETAMINOPHEN 5-325 MG PO TABS: 1.0000 | ORAL_TABLET | Freq: Four times a day (QID) | ORAL | 0 refills | Status: DC | PRN

## 2019-08-03 NOTE — ED Triage Notes (Signed)
Pt presents with right side neck pain since yesterday.

## 2019-08-03 NOTE — ED Provider Notes (Signed)
[place]    CSN: [id number] Arrival date & time: 08/03/19  [id number]      History   Chief Complaint Chief Complaint  Patient presents with  . Neck Pain    HPI Sheila [name] is a 48 y.o. female.   This is a 48 year old woman who was in a motor splint urgent care established patient.  She presents with neck pain on right side that began yesterday.  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  No injury     Past Medical History:  Diagnosis Date  . Anemia   . Ejection fraction     Patient Active Problem List   Diagnosis Date Noted  . Chest pain at rest 05/30/2014  . Ejection fraction   . Abnormal EKG 04/24/2014  . Chest discomfort 04/23/2014  . Panic attack 04/23/2014  . OBESITY 01/15/2009  . HAIR LOSS 01/15/2009  . ANEMIA-NOS 01/14/2009    Past Surgical History:  Procedure Laterality Date  . LEFT HEART CATHETERIZATION WITH CORONARY ANGIOGRAM N/A 05/30/2014   Procedure: LEFT HEART CATHETERIZATION WITH CORONARY ANGIOGRAM;  Surgeon: Peter M Martinique, MD;  Location: [facility] CATH LAB;  Service: Cardiovascular;  Laterality: N/A;  . TUBAL LIGATION      OB History    Gravida  8   Para  6   Term  4   Preterm  2   AB  2   Living  4     SAB  2   TAB      Ectopic      Multiple      Live Births               Home Medications    Prior to Admission medications   Medication Sig Start Date End Date Taking? Authorizing Provider  albuterol (PROVENTIL HFA;VENTOLIN HFA) 108 (90 BASE) MCG/ACT inhaler Inhale 2 puffs into the lungs every 6 (six) hours as needed for wheezing or shortness of breath.    [provider]  docusate sodium (COLACE) 50 MG capsule Take 2 capsules (100 mg total) by mouth daily as needed for mild constipation or moderate constipation. 03/17/19   Scot Jun, FNP  HYDROcodone-acetaminophen (NORCO) 5-325 MG  tablet Take 1 tablet by mouth every 6 (six) hours as needed for moderate pain. 08/03/19   Robyn Haber, MD  meloxicam (MOBIC) 15 MG tablet Take 1 tablet (15 mg total) by mouth daily. 03/17/19   Scot Jun, FNP  polyethylene glycol (MIRALAX / GLYCOLAX) 17 g packet Take 17 g by mouth daily as needed. 03/17/19   Scot Jun, FNP  predniSONE (DELTASONE) 50 MG tablet One daily with food 08/03/19   Robyn Haber, MD    Family History Family History  Problem Relation Age of Onset  . Hypertension Mother   . Diabetes Father   . Hypertension Maternal Aunt   . Hypertension Maternal Uncle   . Cancer Maternal Grandmother   . Hypertension Maternal Grandmother   . Stroke Maternal Grandmother   . Stroke Maternal Grandfather     Social History Social History   Tobacco Use  . Smoking status: Never Smoker  . Smokeless tobacco: Never Used  Substance Use Topics  . Alcohol use: No  . Drug use: No     Allergies   Patient has no known allergies.   Review of Systems Review of Systems  Constitutional: Negative.   Musculoskeletal: Positive for neck pain.  All other systems reviewed and are negative.    Physical Exam Triage Vital Signs ED Triage Vitals  Enc Vitals Group     BP      Pulse      Resp      Temp      Temp src      SpO2      Weight      Height      Head Circumference      Peak Flow      Pain Score      Pain Loc      Pain Edu?      Excl. in [place]?    No data found.  Updated Vital Signs BP 134/81 (BP Location: Right Arm)   Pulse 83   Temp 97.8 F (36.6 C) (Temporal)   Resp 16   SpO2 100%    Physical Exam Vitals signs and nursing note reviewed.  Constitutional:      Appearance: Normal appearance.  HENT:     Head: Normocephalic.     Right Ear: Tympanic membrane normal.     Nose: Nose normal.     Mouth/Throat:     Mouth: Mucous membranes are moist.  Eyes:     Conjunctiva/sclera: Conjunctivae normal.  Neck:     Musculoskeletal: Muscular  tenderness present.     Comments: Tender right cervical paraspinal muscles.  Unable to rotate head because of pain Pulmonary:     Effort: Pulmonary effort is normal.  Musculoskeletal:        General: Tenderness present. No signs of injury.  Skin:    General: Skin is warm and dry.  Neurological:     General: No focal deficit present.     Mental Status: She is alert and oriented to person, place, and time.  Psychiatric:        Mood and Affect: Mood normal.      UC Treatments / Results  Labs (all labs ordered are listed, but only abnormal results are displayed) Labs Reviewed - No data to display  EKG   Radiology No results found.  Procedures Procedures (including critical care time)  Medications Ordered in UC Medications - No data to display  Initial Impression / Assessment and Plan / UC Course  I have reviewed the triage vital signs and the nursing notes.  Pertinent labs & imaging results that were available during my care of the patient were reviewed by me and considered in my medical decision making (see chart for details).    Final Clinical Impressions(s) / UC Diagnoses   Final diagnoses:  Torticollis   Discharge Instructions   None    ED Prescriptions    Medication Sig Dispense Auth. Provider   HYDROcodone-acetaminophen (NORCO) 5-325 MG tablet Take 1 tablet by mouth every 6 (six) hours as needed for moderate pain. 12 tablet Robyn Haber, MD   predniSONE (DELTASONE) 50 MG tablet One daily with food 3 tablet Robyn Haber, MD     I have reviewed the PDMP during this encounter.   Robyn Haber, MD 08/03/19 [phone]

## 2019-08-31 ENCOUNTER — Emergency Department (HOSPITAL_COMMUNITY): Payer: Medicaid Other

## 2019-08-31 ENCOUNTER — Encounter (HOSPITAL_COMMUNITY): Payer: Self-pay | Admitting: Emergency Medicine

## 2019-08-31 ENCOUNTER — Other Ambulatory Visit: Payer: Self-pay

## 2019-08-31 ENCOUNTER — Emergency Department (HOSPITAL_COMMUNITY)
Admission: EM | Admit: 2019-08-31 | Discharge: 2019-08-31 | Disposition: A | Discharge status: Home / Self Care | Payer: Medicaid Other | Attending: Emergency Medicine | Admitting: Emergency Medicine

## 2019-08-31 DIAGNOSIS — Z79899 Other long term (current) drug therapy: Secondary | ICD-10-CM | POA: Diagnosis not present

## 2019-08-31 DIAGNOSIS — Z7982 Long term (current) use of aspirin: Secondary | ICD-10-CM | POA: Insufficient documentation

## 2019-08-31 DIAGNOSIS — R0602 Shortness of breath: Secondary | ICD-10-CM | POA: Insufficient documentation

## 2019-08-31 DIAGNOSIS — R0789 Other chest pain: Secondary | ICD-10-CM | POA: Insufficient documentation

## 2019-08-31 DIAGNOSIS — Z20828 Contact with and (suspected) exposure to other viral communicable diseases: Secondary | ICD-10-CM | POA: Insufficient documentation

## 2019-08-31 DIAGNOSIS — R079 Chest pain, unspecified: Secondary | ICD-10-CM | POA: Diagnosis present

## 2019-08-31 DIAGNOSIS — M546 Pain in thoracic spine: Secondary | ICD-10-CM

## 2019-08-31 LAB — CBC
HCT: 36.4 % (ref 36.0–46.0)
Hemoglobin: 12.2 g/dL (ref 12.0–15.0)
MCH: 29.4 pg (ref 26.0–34.0)
MCHC: 33.5 g/dL (ref 30.0–36.0)
MCV: 87.7 fL (ref 80.0–100.0)
Platelets: 270 10*3/uL (ref 150–400)
RBC: 4.15 MIL/uL (ref 3.87–5.11)
RDW: 12.5 % (ref 11.5–15.5)
WBC: 4 10*3/uL (ref 4.0–10.5)
nRBC: 0 % (ref 0.0–0.2)

## 2019-08-31 LAB — COMPREHENSIVE METABOLIC PANEL
ALT: 16 U/L (ref 0–44)
AST: 21 U/L (ref 15–41)
Albumin: 3.9 g/dL (ref 3.5–5.0)
Alkaline Phosphatase: 65 U/L (ref 38–126)
Anion gap: 8 (ref 5–15)
BUN: 9 mg/dL (ref 6–20)
CO2: 23 mmol/L (ref 22–32)
Calcium: 8.9 mg/dL (ref 8.9–10.3)
Chloride: 110 mmol/L (ref 98–111)
Creatinine, Ser: 0.96 mg/dL (ref 0.44–1.00)
GFR calc Af Amer: >60 mL/min (ref >60)
GFR calc non Af Amer: >60 mL/min (ref >60)
Glucose, Bld: 100 mg/dL — ABNORMAL HIGH (ref 70–99)
Potassium: 3.7 mmol/L (ref 3.5–5.1)
Sodium: 141 mmol/L (ref 135–145)
Total Bilirubin: 1.4 mg/dL — ABNORMAL HIGH (ref 0.3–1.2)
Total Protein: 6.8 g/dL (ref 6.5–8.1)

## 2019-08-31 LAB — SARS CORONAVIRUS 2 BY RT PCR (HOSPITAL ORDER, PERFORMED IN ~~LOC~~ HOSPITAL LAB): SARS Coronavirus 2: NEGATIVE

## 2019-08-31 LAB — TROPONIN I (HIGH SENSITIVITY)
Troponin I (High Sensitivity): 3 ng/L (ref <18)
Troponin I (High Sensitivity): 4 ng/L (ref <18)

## 2019-08-31 LAB — I-STAT BETA HCG BLOOD, ED (MC, WL, AP ONLY): I-stat hCG, quantitative: <5 m[IU]/mL (ref <5)

## 2019-08-31 LAB — D-DIMER, QUANTITATIVE: D-Dimer, Quant: 0.78 ug/mL-FEU — ABNORMAL HIGH (ref 0.00–0.50)

## 2019-08-31 MED ORDER — METHOCARBAMOL 500 MG PO TABS: 1000.0000 mg | ORAL_TABLET | Freq: Three times a day (TID) | ORAL | 0 refills | Status: DC | PRN

## 2019-08-31 MED ORDER — LORAZEPAM 2 MG/ML IJ SOLN
0.5000 mg | Freq: Once | INTRAMUSCULAR | Status: DC
  Filled 2019-08-31: qty 1

## 2019-08-31 MED ORDER — IOHEXOL 350 MG/ML SOLN
75.0000 mL | Freq: Once | INTRAVENOUS | Status: AC | PRN
  Administered 2019-08-31: 75 mL via INTRAVENOUS

## 2019-08-31 MED ORDER — KETOROLAC TROMETHAMINE 30 MG/ML IJ SOLN
30.0000 mg | Freq: Once | INTRAMUSCULAR | Status: AC
  Administered 2019-08-31: 30 mg via INTRAVENOUS
  Filled 2019-08-31: qty 1

## 2019-08-31 MED ORDER — SODIUM CHLORIDE 0.9% FLUSH
3.0000 mL | Freq: Once | INTRAVENOUS | Status: AC
  Administered 2019-08-31: 18:00:00 via INTRAVENOUS

## 2019-08-31 NOTE — ED Provider Notes (Signed)
[place] EMERGENCY DEPARTMENT Provider Note   CSN: [id number] Arrival date & time: 08/31/19  1440     History   Chief Complaint Chief Complaint  Patient presents with  . Chest Pain  . Shortness of Breath    HPI Sheila [name] is a 48 y.o. female.     HPI Patient presents with central chest tightness and thoracic back pain which she describes as sharp.  Symptoms started roughly 30 minutes prior to coming to the emergency department.  States the pain is worse with deep breathing.  She does have associated shortness of breath.  Denies cough, fever or chills.  No new lower extremity swelling or pain.  Patient does have like some tingling to the distal tips of her right hand which she states she has had for more than a month. Past Medical History:  Diagnosis Date  . Anemia   . Ejection fraction     Patient Active Problem List   Diagnosis Date Noted  . Chest pain at rest 05/30/2014  . Ejection fraction   . Abnormal EKG 04/24/2014  . Chest discomfort 04/23/2014  . Panic attack 04/23/2014  . OBESITY 01/15/2009  . HAIR LOSS 01/15/2009  . ANEMIA-NOS 01/14/2009    Past Surgical History:  Procedure Laterality Date  . LEFT HEART CATHETERIZATION WITH CORONARY ANGIOGRAM N/A 05/30/2014   Procedure: LEFT HEART CATHETERIZATION WITH CORONARY ANGIOGRAM;  Surgeon: Peter M Martinique, MD;  Location: [facility] CATH LAB;  Service: Cardiovascular;  Laterality: N/A;  . TUBAL LIGATION       OB History    Gravida  8   Para  6   Term  4   Preterm  2   AB  2   Living  4     SAB  2   TAB      Ectopic      Multiple      Live Births               Home Medications    Prior to Admission medications   Medication Sig Start Date End Date Taking? Authorizing Provider  albuterol (PROVENTIL HFA;VENTOLIN HFA) 108 (90 BASE) MCG/ACT inhaler Inhale 2 puffs into the lungs every 6 (six) hours as needed for wheezing or shortness of breath.   Yes [provider]   aspirin EC 325 MG tablet Take 325 mg by mouth daily as needed (pain).   Yes [provider]  ELDERBERRY PO Take 1 tablet by mouth every morning.   Yes [provider]  HYDROcodone-acetaminophen (NORCO) 5-325 MG tablet Take 1 tablet by mouth every 6 (six) hours as needed for moderate pain. 08/03/19  Yes Robyn Haber, MD  docusate sodium (COLACE) 50 MG capsule Take 2 capsules (100 mg total) by mouth daily as needed for mild constipation or moderate constipation. Patient not taking: Reported on 08/31/2019 03/17/19   Scot Jun, FNP  meloxicam (MOBIC) 15 MG tablet Take 1 tablet (15 mg total) by mouth daily. Patient not taking: Reported on 08/31/2019 03/17/19   Scot Jun, FNP  methocarbamol (ROBAXIN) 500 MG tablet Take 2 tablets (1,000 mg total) by mouth every 8 (eight) hours as needed for muscle spasms. 08/31/19   Julianne Rice, MD  polyethylene glycol (MIRALAX / GLYCOLAX) 17 g packet Take 17 g by mouth daily as needed. Patient not taking: Reported on 08/31/2019 03/17/19   Scot Jun, FNP    Family History Family History  Problem Relation Age of Onset  .  Hypertension Mother   . Diabetes Father   . Hypertension Maternal Aunt   . Hypertension Maternal Uncle   . Cancer Maternal Grandmother   . Hypertension Maternal Grandmother   . Stroke Maternal Grandmother   . Stroke Maternal Grandfather     Social History Social History   Tobacco Use  . Smoking status: Never Smoker  . Smokeless tobacco: Never Used  Substance Use Topics  . Alcohol use: No  . Drug use: No     Allergies   Patient has no known allergies.   Review of Systems Review of Systems  Constitutional: Positive for diaphoresis. Negative for chills and fever.  HENT: Negative for sore throat and trouble swallowing.   Respiratory: Positive for chest tightness and shortness of breath. Negative for cough.   Cardiovascular: Positive for chest pain. Negative for palpitations and leg  swelling.  Gastrointestinal: Negative for abdominal pain, constipation, diarrhea, nausea and vomiting.  Genitourinary: Negative for dysuria, flank pain and frequency.  Musculoskeletal: Positive for back pain and myalgias. Negative for arthralgias and neck pain.  Skin: Negative for rash and wound.  Neurological: Negative for dizziness, weakness, light-headedness, numbness and headaches.  Psychiatric/Behavioral: The patient is nervous/anxious.   All other systems reviewed and are negative.    Physical Exam Updated Vital Signs BP (!) 146/90   Pulse 70   Temp 98.5 F (36.9 C) (Oral)   Resp 19   SpO2 97%   Physical Exam Vitals signs and nursing note reviewed.  Constitutional:      General: She is not in acute distress.    Appearance: Normal appearance. She is well-developed. She is not ill-appearing.  HENT:     Head: Normocephalic and atraumatic.     Nose: Nose normal.     Mouth/Throat:     Mouth: Mucous membranes are moist.  Eyes:     Pupils: Pupils are equal, round, and reactive to light.  Neck:     Musculoskeletal: Normal range of motion and neck supple. No neck rigidity or muscular tenderness.  Cardiovascular:     Rate and Rhythm: Normal rate and regular rhythm.     Heart sounds: No murmur. No friction rub. No gallop.   Pulmonary:     Effort: Pulmonary effort is normal. No respiratory distress.     Breath sounds: Normal breath sounds. No stridor. No wheezing, rhonchi or rales.  Chest:     Chest wall: No tenderness.  Abdominal:     General: Bowel sounds are normal. There is no distension.     Palpations: Abdomen is soft. There is no mass.     Tenderness: There is no abdominal tenderness. There is no right CVA tenderness, left CVA tenderness, guarding or rebound.     Hernia: No hernia is present.  Musculoskeletal: Normal range of motion.        General: No swelling, tenderness, deformity or signs of injury.     Right lower leg: No edema.     Left lower leg: No edema.      Comments: Patient with bilateral medial scapular muscular tenderness to palpation.  No midline thoracic lumbar tenderness.  No lower extremity swelling, asymmetry or tenderness.  Distal pulses present and all extremities.  Lymphadenopathy:     Cervical: No cervical adenopathy.  Skin:    General: Skin is warm and dry.     Capillary Refill: Capillary refill takes less than 2 seconds.     Findings: No erythema or rash.  Neurological:     Mental  Status: She is alert and oriented to person, place, and time.     Comments: 5/5 motor all extremities.  Patient does complain of paresthesias to the distal fingers especially of the third through fifth digits of the right hand.  Distal cap refill is intact.  Psychiatric:     Comments: Mildly anxious appearing      ED Treatments / Results  Labs (all labs ordered are listed, but only abnormal results are displayed) Labs Reviewed  COMPREHENSIVE METABOLIC PANEL - Abnormal; Notable for the following components:      Result Value   Glucose, Bld 100 (*)    Total Bilirubin 1.4 (*)    All other components within normal limits  D-DIMER, QUANTITATIVE (NOT AT [facility]) - Abnormal; Notable for the following components:   D-Dimer, Quant 0.78 (*)    All other components within normal limits  SARS CORONAVIRUS 2 BY RT PCR (HOSPITAL ORDER, [facility])  CBC  I-STAT BETA HCG BLOOD, ED ([place], [facility], [facility] ONLY)  TROPONIN I (HIGH SENSITIVITY)  TROPONIN I (HIGH SENSITIVITY)    EKG EKG Interpretation  Date/Time:  Friday August 31 2019 14:47:10 EDT Ventricular Rate:  90 PR Interval:  118 QRS Duration: 70 QT Interval:  336 QTC Calculation: 411 R Axis:   24 Text Interpretation:  Normal sinus rhythm ST & T wave abnormality, consider inferolateral ischemia Abnormal ECG Confirmed by Julianne Rice [id number] on 08/31/2019 4:24:53 PM   Radiology Dg Chest 2 View  Result Date: 08/31/2019 CLINICAL DATA:  Chest pain shortness of breath began  about 1 hour ago. EXAM: CHEST - 2 VIEW COMPARISON:  04/29/2018 FINDINGS: Lungs are clear. Cardiomediastinal contours and pulmonary vasculature are unremarkable. No signs of pleural effusion. No acute bone process. IMPRESSION: No active cardiopulmonary disease. Electronically Signed   By: Zetta Bills M.D.   On: 08/31/2019 15:39   Ct Angio Chest Pe W And/or Wo Contrast  Result Date: 08/31/2019 CLINICAL DATA:  48 year old female with acute chest pain and shortness of breath. EXAM: CT ANGIOGRAPHY CHEST WITH CONTRAST TECHNIQUE: Multidetector CT imaging of the chest was performed using the standard protocol during bolus administration of intravenous contrast. Multiplanar CT image reconstructions and MIPs were obtained to evaluate the vascular anatomy. CONTRAST:  53mL OMNIPAQUE IOHEXOL 350 MG/ML SOLN COMPARISON:  10/11/2016 CT FINDINGS: Cardiovascular: This is a technically satisfactory study. No pulmonary emboli are identified. Heart size normal. No thoracic aortic aneurysm or pericardial effusion. Mediastinum/Nodes: No enlarged mediastinal, hilar, or axillary lymph nodes. Thyroid gland, trachea, and esophagus demonstrate no significant findings. Lungs/Pleura: The lungs are clear. No airspace disease, consolidation, mass, nodule, pleural effusion or pneumothorax. Upper Abdomen: No acute abnormality. Musculoskeletal: No acute or suspicious bony abnormalities. Review of the MIP images confirms the above findings. IMPRESSION: No evidence of acute or significant abnormality. No evidence of pulmonary emboli. Electronically Signed   By: Margarette Canada M.D.   On: 08/31/2019 20:36    Procedures Procedures (including critical care time)  Medications Ordered in ED Medications  LORazepam (ATIVAN) injection 0.5 mg (0 mg Intravenous Hold 08/31/19 1808)  sodium chloride flush (NS) 0.9 % injection 3 mL ( Intravenous Given 08/31/19 1805)  ketorolac (TORADOL) 30 MG/ML injection 30 mg (30 mg Intravenous Given 08/31/19 1805)   iohexol (OMNIPAQUE) 350 MG/ML injection 75 mL (75 mLs Intravenous Contrast Given 08/31/19 2013)     Initial Impression / Assessment and Plan / ED Course  I have reviewed the triage vital signs and the nursing notes.  Pertinent labs & imaging results that were available during my care of the patient were reviewed by me and considered in my medical decision making (see chart for details).        Troponin x2 is normal.  Mild elevation in D-dimer.  CT angio chest without concerning findings.  Symptoms are likely musculoskeletal.  Likely a component of anxiety as well.  Patient had cardiac catheterization 5 years ago which was normal.  Will treat symptomatically.  Return precautions given.  Final Clinical Impressions(s) / ED Diagnoses   Final diagnoses:  Atypical chest pain  Acute bilateral thoracic back pain    ED Discharge Orders         Ordered    methocarbamol (ROBAXIN) 500 MG tablet  Every 8 hours PRN     08/31/19 2134           Julianne Rice, MD 08/31/19 2135

## 2019-08-31 NOTE — ED Notes (Signed)
Pt states '[place], [place] know a lot of people with corona. Ive had diarrhea and shortness of breath since last night. My chest hurts when I breathe." No hx of PE, recent travel.

## 2019-08-31 NOTE — ED Triage Notes (Addendum)
Sharp cp and sob started all of a sudden hurts to breath states her fingers get numb at time usually rt hand that comes and goes states started to sweat

## 2019-10-21 IMAGING — CT CT RENAL STONE PROTOCOL
2 of 4 series · 16 of 46 positions shown, 18 images · non-contrast
Comparison: CT AP [DATE]

CLINICAL DATA: Low back pain and progressive urinary urgency and
frequency.

EXAM:
CT ABDOMEN AND PELVIS WITHOUT CONTRAST
TECHNIQUE: Multidetector CT imaging of the abdomen and pelvis was performed
following the standard protocol without IV contrast.

[Series 3: ap without · axial · non-contrast · 0.79mm/px · z∈[+814,+1214]mm · 13 of 92 slices shown, 15 images]
[im 6/92  soft-tissue]
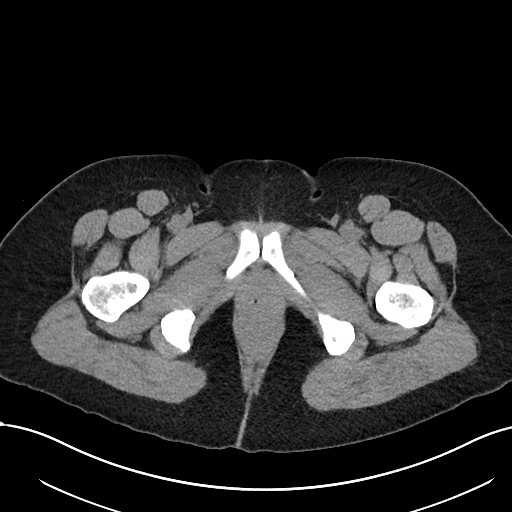
[im 6/92  bone]
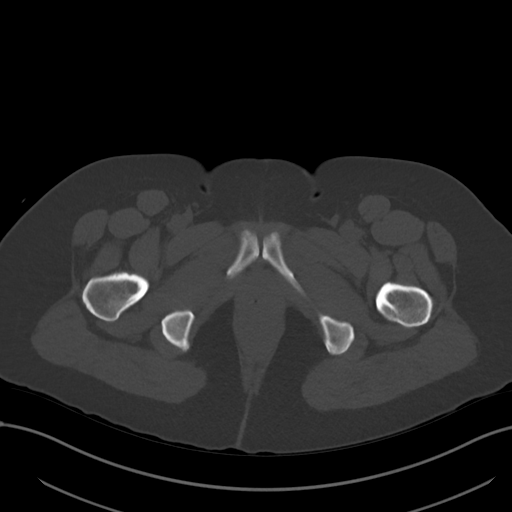
[im 11/92  soft-tissue]
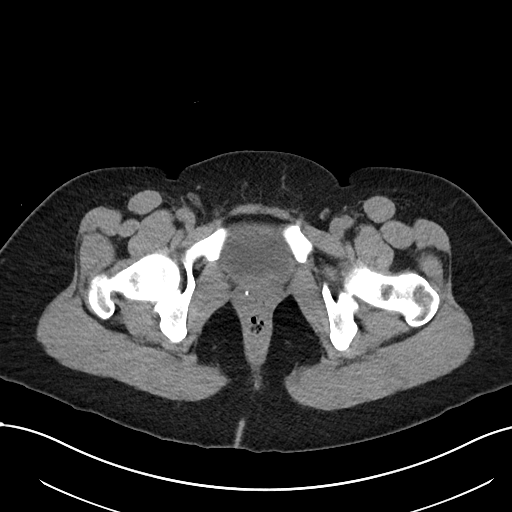
[im 22/92  soft-tissue]
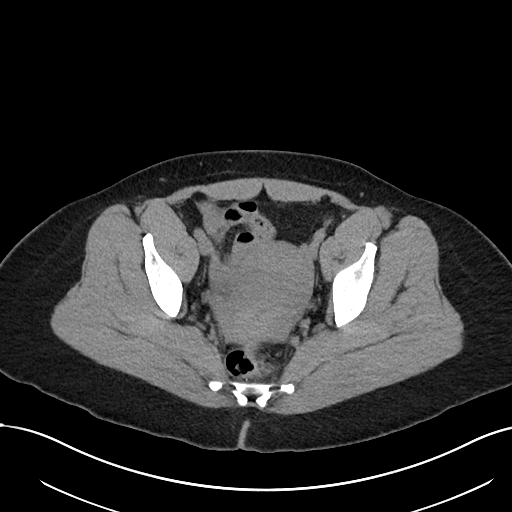
[im 27/92  soft-tissue]
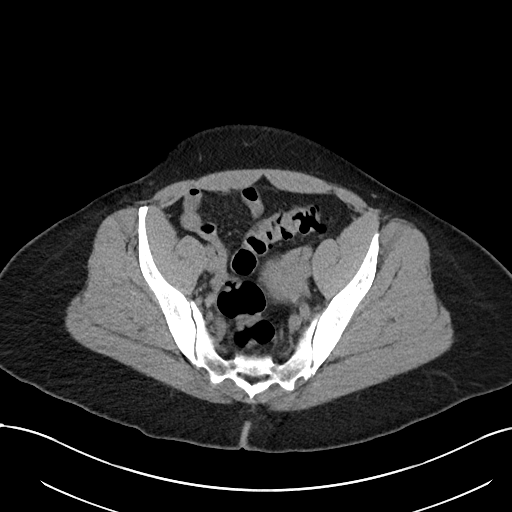
[im 33/92  soft-tissue]
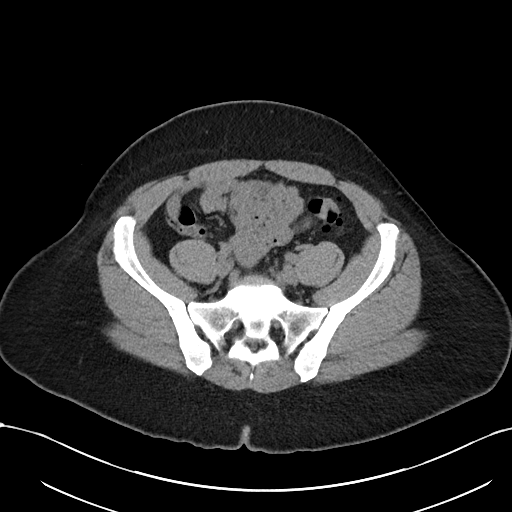
[im 38/92  soft-tissue]
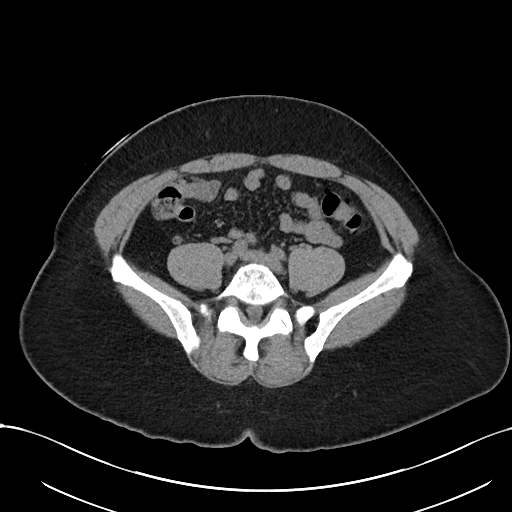
[im 49/92  soft-tissue]
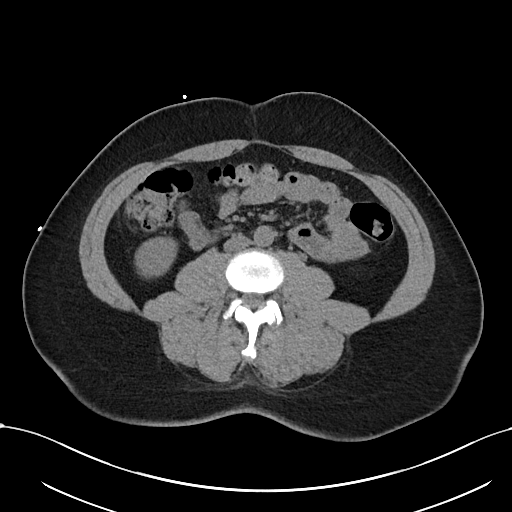
[im 54/92  soft-tissue]
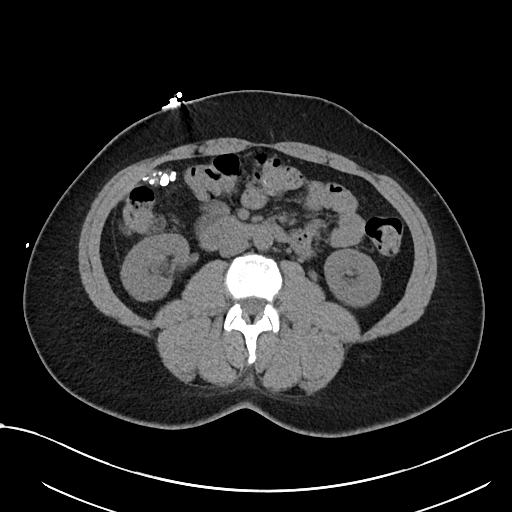
[im 59/92  soft-tissue]
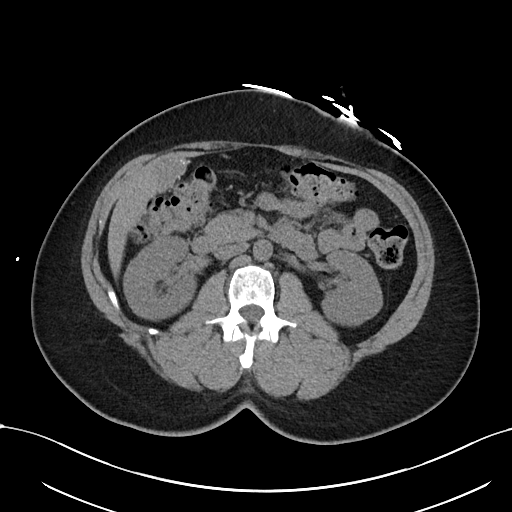
[im 59/92  bone]
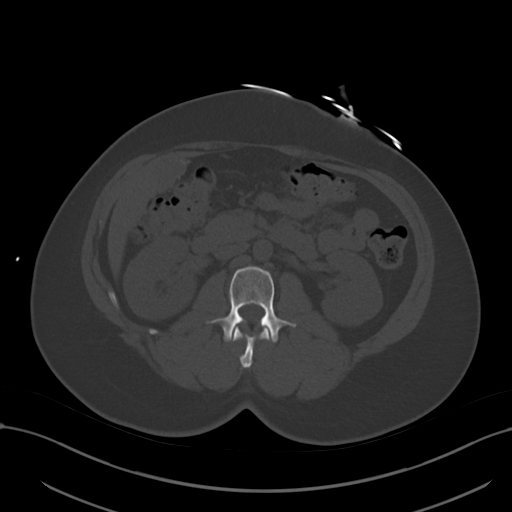
[im 65/92  soft-tissue]
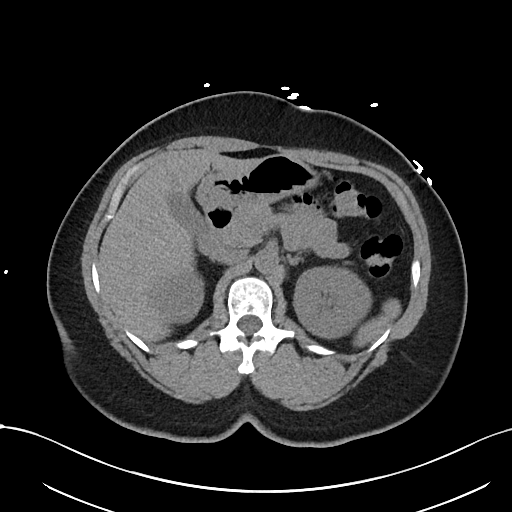
[im 70/92  soft-tissue]
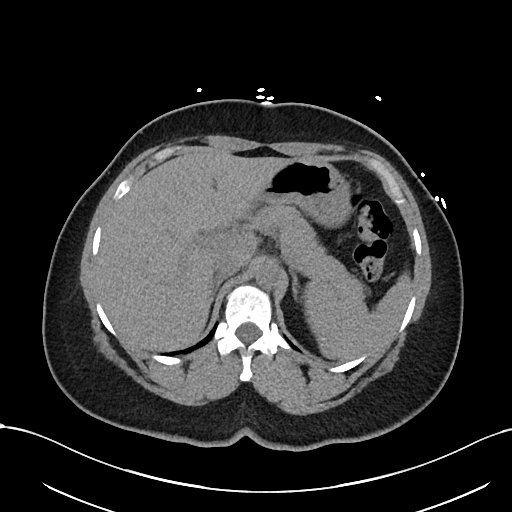
[im 81/92  soft-tissue]
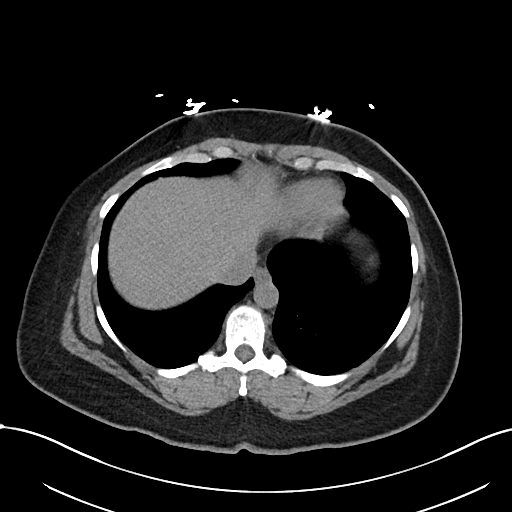
[im 86/92  soft-tissue]
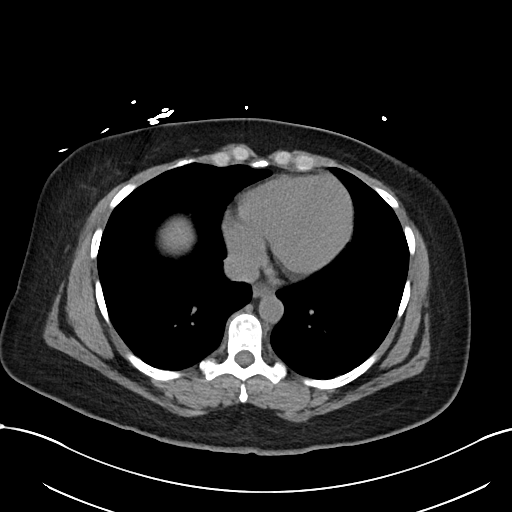

[Series 6: cor · coronal · 0.74mm/px · 3 of 72 slices shown]
[im 24/72  soft-tissue]
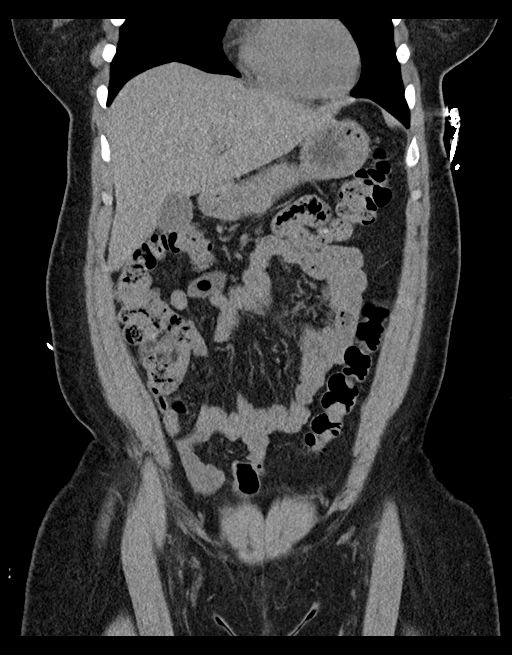
[im 32/72  soft-tissue]
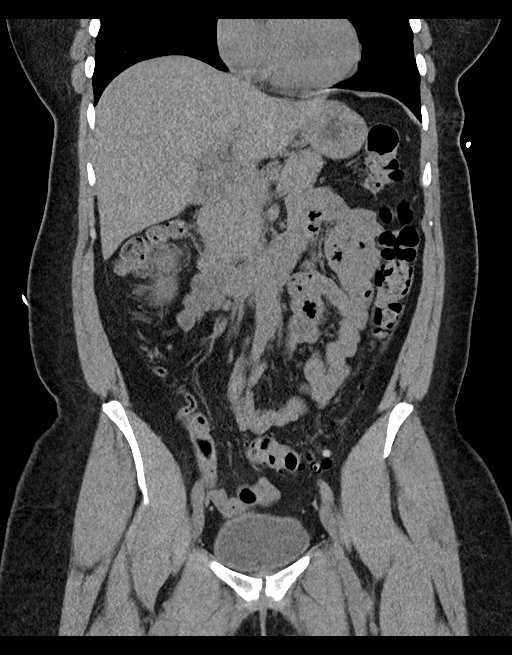
[im 40/72  soft-tissue]
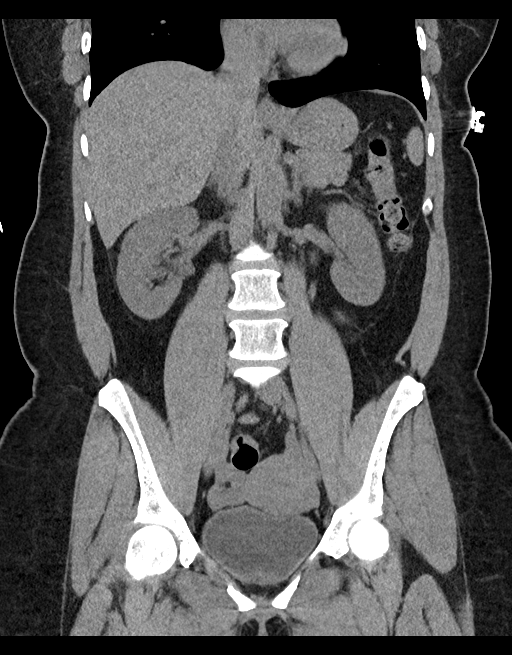

[16 of 46 positions shown; findings below may reference images not displayed]

FINDINGS: Lower chest: No acute abnormality.

Hepatobiliary: Lesion involving the inferior aspect of right lobe of
liver with multiple rounded calcifications are again noted and
appear unchanged from comparison exam compatible with a benign
abnormality. Gallbladder appears normal. No biliary dilatation.

Pancreas: Unremarkable. No pancreatic ductal dilatation or
surrounding inflammatory changes.

Spleen: Normal in size without focal abnormality.

Adrenals/Urinary Tract: Normal appearance of the adrenal glands. No
kidney stones or hydronephrosis identified. No ureteral calculi
noted. Small exophytic lesion arising from the posterolateral cortex
of the interpolar left kidney measures 1.2 cm. This is incompletely
characterized without IV contrast. Urinary bladder appears normal.

Stomach/Bowel: The stomach is normal. The small bowel loops have a
normal course and caliber. No bowel obstruction. Unremarkable
appearance of the colon.

Vascular/Lymphatic: Normal appearance of the abdominal aorta. No
enlarged retroperitoneal or mesenteric adenopathy. No enlarged
pelvic or inguinal lymph nodes.

Reproductive: The uterus and adnexal structures are unremarkable.

Other: No free fluid or fluid collections identified within the
abdomen or pelvis.

Musculoskeletal: Unremarkable appearance of the colon.
IMPRESSION: 1. No acute findings within the abdomen or pelvis. No urinary tract
calculi noted.
2. Stable calcified lesion arising from the inferior aspect of the
right lobe of liver.

## 2019-11-29 ENCOUNTER — Ambulatory Visit (HOSPITAL_COMMUNITY)
Admission: EM | Admit: 2019-11-29 | Discharge: 2019-11-29 | Disposition: A | Discharge status: Home / Self Care | Payer: Medicaid Other | Attending: Family Medicine | Admitting: Family Medicine

## 2019-11-29 ENCOUNTER — Encounter (HOSPITAL_COMMUNITY): Payer: Self-pay

## 2019-11-29 ENCOUNTER — Other Ambulatory Visit: Payer: Self-pay

## 2019-11-29 DIAGNOSIS — R300 Dysuria: Secondary | ICD-10-CM

## 2019-11-29 LAB — POCT URINALYSIS DIP (DEVICE)
Bilirubin Urine: NEGATIVE
Glucose, UA: NEGATIVE mg/dL
Ketones, ur: NEGATIVE mg/dL
Leukocytes,Ua: NEGATIVE
Nitrite: NEGATIVE
Protein, ur: NEGATIVE mg/dL
Specific Gravity, Urine: 1.025 (ref 1.005–1.030)
Urobilinogen, UA: 0.2 mg/dL (ref 0.0–1.0)
pH: 5.5 (ref 5.0–8.0)

## 2019-11-29 MED ORDER — CLINDAMYCIN HCL 300 MG PO CAPS: 300.0000 mg | ORAL_CAPSULE | Freq: Three times a day (TID) | ORAL | 0 refills | Status: DC

## 2019-11-29 NOTE — ED Provider Notes (Signed)
[place]    CSN: [id number] Arrival date & time: 11/29/19  1255      History   Chief Complaint Chief Complaint  Patient presents with  . Urinary Tract Infection    HPI Sheila [name] is a 49 y.o. female.   HPI Patient is still having issues going on.  First she has "hair bumps" in her pubic region.  She states that these have turned into hard nodules that do not go away.  They are tender.  She thinks that she may have a skin infection. She also has some urinary frequency and burning for about a month.  No blood in the urine.  No fever.  No back pain.  No nausea.  No history of kidney stones or kidney infections. She also states that she had a sexual encounter and she is worried with STDs.  No vaginal discharge.  No abdominal pain.  No irregular vaginal bleeding. Past Medical History:  Diagnosis Date  . Anemia   . Ejection fraction     Patient Active Problem List   Diagnosis Date Noted  . Chest pain at rest 05/30/2014  . Ejection fraction   . Abnormal EKG 04/24/2014  . Chest discomfort 04/23/2014  . Panic attack 04/23/2014  . OBESITY 01/15/2009  . HAIR LOSS 01/15/2009  . ANEMIA-NOS 01/14/2009    Past Surgical History:  Procedure Laterality Date  . LEFT HEART CATHETERIZATION WITH CORONARY ANGIOGRAM N/A 05/30/2014   Procedure: LEFT HEART CATHETERIZATION WITH CORONARY ANGIOGRAM;  Surgeon: Peter M Martinique, MD;  Location: [facility] CATH LAB;  Service: Cardiovascular;  Laterality: N/A;  . TUBAL LIGATION      OB History    Gravida  8   Para  6   Term  4   Preterm  2   AB  2   Living  4     SAB  2   TAB      Ectopic      Multiple      Live Births               Home Medications    Prior to Admission medications   Medication Sig Start Date End Date Taking? Authorizing Provider  albuterol (PROVENTIL HFA;VENTOLIN HFA) 108 (90 BASE) MCG/ACT inhaler Inhale 2 puffs into the lungs every 6 (six) hours as needed for wheezing or shortness of  breath.    [provider]  aspirin EC 325 MG tablet Take 325 mg by mouth daily as needed (pain).    [provider]  clindamycin (CLEOCIN) 300 MG capsule Take 1 capsule (300 mg total) by mouth 3 (three) times daily. 11/29/19   Raylene [name], MD  ELDERBERRY PO Take 1 tablet by mouth every morning.    [provider]    Family History Family History  Problem Relation Age of Onset  . Hypertension Mother   . Heart Problems Mother   . Hyperlipidemia Mother   . Diabetes Father   . Seizures Father   . Hypertension Maternal Aunt   . Hypertension Maternal Uncle   . Cancer Maternal Grandmother   . Hypertension Maternal Grandmother   . Stroke Maternal Grandmother   . Stroke Maternal Grandfather     Social History Social History   Tobacco Use  . Smoking status: Never Smoker  . Smokeless tobacco: Never Used  Substance Use Topics  . Alcohol use: No  . Drug use: No     Allergies   Patient has no known  allergies.   Review of Systems Review of Systems  Constitutional: Negative for chills and fever.  HENT: Negative for congestion and rhinorrhea.   Respiratory: Negative for cough and shortness of breath.   Cardiovascular: Negative for chest pain and leg swelling.  Gastrointestinal: Negative for diarrhea and vomiting.  Genitourinary: Positive for dysuria and frequency. Negative for flank pain, vaginal bleeding and vaginal discharge.  Musculoskeletal: Negative for back pain and myalgias.  Skin: Negative for rash.  Neurological: Negative for dizziness and headaches.     Physical Exam Triage Vital Signs ED Triage Vitals  Enc Vitals Group     BP 11/29/19 1315 107/66     Pulse Rate 11/29/19 1315 92     Resp 11/29/19 1315 18     Temp 11/29/19 1315 98.5 F (36.9 C)     Temp Source 11/29/19 1315 Oral     SpO2 11/29/19 1315 95 %     Weight 11/29/19 1313 221 lb 6.4 oz (100.4 kg)     Height --      Head Circumference --      Peak Flow --      Pain  Score 11/29/19 1313 0     Pain Loc --      Pain Edu? --      Excl. in [place]? --    No data found.  Updated Vital Signs BP 107/66 (BP Location: Left Arm)   Pulse 92   Temp 98.5 F (36.9 C) (Oral)   Resp 18   Wt 100.4 kg   SpO2 95%   BMI 32.70 kg/m      Physical Exam Constitutional:      General: She is not in acute distress.    Appearance: She is well-developed and normal weight.     Comments: Appears healthy.  Cooperative  HENT:     Head: Normocephalic and atraumatic.     Mouth/Throat:     Comments: Is wearing a mask Eyes:     Conjunctiva/sclera: Conjunctivae normal.     Pupils: Pupils are equal, round, and reactive to light.  Cardiovascular:     Rate and Rhythm: Normal rate and regular rhythm.     Heart sounds: Normal heart sounds.  Pulmonary:     Effort: Pulmonary effort is normal. No respiratory distress.     Breath sounds: Normal breath sounds.  Abdominal:     Palpations: Abdomen is soft.     Tenderness: There is no abdominal tenderness. There is no right CVA tenderness or left CVA tenderness.     Comments: No abdominal pain, no flank pain  Musculoskeletal:        General: Normal range of motion.     Cervical back: Normal range of motion.  Skin:    General: Skin is warm and dry.     Comments: On the previous there are several hyperpigmented nodules mostly 1 cm across.Marland Kitchen  Possible keloids from old infection.  Neurological:     Mental Status: She is alert. Mental status is at baseline.     Gait: Gait normal.  Psychiatric:        Behavior: Behavior normal.      UC Treatments / Results  Labs (all labs ordered are listed, but only abnormal results are displayed) Labs Reviewed  POCT URINALYSIS DIP (DEVICE) - Abnormal; Notable for the following components:      Result Value   Hgb urine dipstick TRACE (*)    All other components within normal limits  CERVICOVAGINAL ANCILLARY ONLY  EKG   Radiology No results found.  Procedures Procedures (including  critical care time)  Medications Ordered in UC Medications - No data to display  Initial Impression / Assessment and Plan / UC Course  I have reviewed the triage vital signs and the nursing notes.  Pertinent labs & imaging results that were available during my care of the patient were reviewed by me and considered in my medical decision making (see chart for details).     Urinalysis does not support UTI.  Swab was sent for STD testing.  Patient will be notified if any of your tests are positive. Final Clinical Impressions(s) / UC Diagnoses   Final diagnoses:  Dysuria     Discharge Instructions     Your urine test is clear You will be called if any of your cultures are positive I am giving you an antibiotic to treat the infection in your skin Call for problems   ED Prescriptions    Medication Sig Dispense Auth. Provider   clindamycin (CLEOCIN) 300 MG capsule Take 1 capsule (300 mg total) by mouth 3 (three) times daily. 21 capsule Raylene [name], MD     PDMP not reviewed this encounter.   Raylene [name], MD 11/29/19 1440

## 2019-11-29 NOTE — Discharge Instructions (Signed)
Your urine test is clear You will be called if any of your cultures are positive I am giving you an antibiotic to treat the infection in your skin Call for problems

## 2019-11-29 NOTE — ED Triage Notes (Signed)
Pt. States she has had frequent urination & burning when urinating for a month now. States she has hair bumps but they have not went away.

## 2019-11-30 LAB — CERVICOVAGINAL ANCILLARY ONLY
Chlamydia: NEGATIVE
Neisseria Gonorrhea: NEGATIVE
Trichomonas: NEGATIVE

## 2020-03-29 ENCOUNTER — Emergency Department (HOSPITAL_COMMUNITY): Payer: Medicaid Other

## 2020-03-29 ENCOUNTER — Other Ambulatory Visit: Payer: Self-pay

## 2020-03-29 ENCOUNTER — Emergency Department (HOSPITAL_BASED_OUTPATIENT_CLINIC_OR_DEPARTMENT_OTHER): Payer: Medicaid Other

## 2020-03-29 ENCOUNTER — Emergency Department (HOSPITAL_COMMUNITY)
Admission: EM | Admit: 2020-03-29 | Discharge: 2020-03-29 | Disposition: A | Discharge status: Home / Self Care | Payer: Medicaid Other | Attending: Emergency Medicine | Admitting: Emergency Medicine

## 2020-03-29 ENCOUNTER — Encounter (HOSPITAL_COMMUNITY): Payer: Self-pay | Admitting: Emergency Medicine

## 2020-03-29 DIAGNOSIS — R52 Pain, unspecified: Secondary | ICD-10-CM | POA: Diagnosis not present

## 2020-03-29 DIAGNOSIS — M79605 Pain in left leg: Secondary | ICD-10-CM | POA: Diagnosis not present

## 2020-03-29 DIAGNOSIS — M7989 Other specified soft tissue disorders: Secondary | ICD-10-CM

## 2020-03-29 DIAGNOSIS — R600 Localized edema: Secondary | ICD-10-CM | POA: Insufficient documentation

## 2020-03-29 DIAGNOSIS — Z733 Stress, not elsewhere classified: Secondary | ICD-10-CM | POA: Insufficient documentation

## 2020-03-29 DIAGNOSIS — M79604 Pain in right leg: Secondary | ICD-10-CM | POA: Insufficient documentation

## 2020-03-29 DIAGNOSIS — Z79899 Other long term (current) drug therapy: Secondary | ICD-10-CM | POA: Insufficient documentation

## 2020-03-29 DIAGNOSIS — R079 Chest pain, unspecified: Secondary | ICD-10-CM

## 2020-03-29 LAB — CBC WITH DIFFERENTIAL/PLATELET
Abs Immature Granulocytes: 0.01 10*3/uL (ref 0.00–0.07)
Basophils Absolute: 0 10*3/uL (ref 0.0–0.1)
Basophils Relative: 1 %
Eosinophils Absolute: 0.1 10*3/uL (ref 0.0–0.5)
Eosinophils Relative: 3 %
HCT: 37.7 % (ref 36.0–46.0)
Hemoglobin: 12.3 g/dL (ref 12.0–15.0)
Immature Granulocytes: 0 %
Lymphocytes Relative: 36 %
Lymphs Abs: 1.4 10*3/uL (ref 0.7–4.0)
MCH: 29.3 pg (ref 26.0–34.0)
MCHC: 32.6 g/dL (ref 30.0–36.0)
MCV: 89.8 fL (ref 80.0–100.0)
Monocytes Absolute: 0.4 10*3/uL (ref 0.1–1.0)
Monocytes Relative: 9 %
Neutro Abs: 2 10*3/uL (ref 1.7–7.7)
Neutrophils Relative %: 51 %
Platelets: 303 10*3/uL (ref 150–400)
RBC: 4.2 MIL/uL (ref 3.87–5.11)
RDW: 12.7 % (ref 11.5–15.5)
WBC: 3.9 10*3/uL — ABNORMAL LOW (ref 4.0–10.5)
nRBC: 0 % (ref 0.0–0.2)

## 2020-03-29 LAB — COMPREHENSIVE METABOLIC PANEL
ALT: 18 U/L (ref 0–44)
AST: 24 U/L (ref 15–41)
Albumin: 3.8 g/dL (ref 3.5–5.0)
Alkaline Phosphatase: 61 U/L (ref 38–126)
Anion gap: 11 (ref 5–15)
BUN: 9 mg/dL (ref 6–20)
CO2: 23 mmol/L (ref 22–32)
Calcium: 8.9 mg/dL (ref 8.9–10.3)
Chloride: 109 mmol/L (ref 98–111)
Creatinine, Ser: 0.85 mg/dL (ref 0.44–1.00)
GFR calc Af Amer: >60 mL/min (ref >60)
GFR calc non Af Amer: >60 mL/min (ref >60)
Glucose, Bld: 106 mg/dL — ABNORMAL HIGH (ref 70–99)
Potassium: 3.7 mmol/L (ref 3.5–5.1)
Sodium: 143 mmol/L (ref 135–145)
Total Bilirubin: 1.3 mg/dL — ABNORMAL HIGH (ref 0.3–1.2)
Total Protein: 7.1 g/dL (ref 6.5–8.1)

## 2020-03-29 LAB — URINALYSIS, ROUTINE W REFLEX MICROSCOPIC
Bilirubin Urine: NEGATIVE
Glucose, UA: NEGATIVE mg/dL
Hgb urine dipstick: NEGATIVE
Ketones, ur: NEGATIVE mg/dL
Nitrite: NEGATIVE
Protein, ur: NEGATIVE mg/dL
Specific Gravity, Urine: 1.011 (ref 1.005–1.030)
pH: 6 (ref 5.0–8.0)

## 2020-03-29 LAB — D-DIMER, QUANTITATIVE: D-Dimer, Quant: 0.62 ug/mL-FEU — ABNORMAL HIGH (ref 0.00–0.50)

## 2020-03-29 LAB — TROPONIN I (HIGH SENSITIVITY)
Troponin I (High Sensitivity): 3 ng/L (ref <18)
Troponin I (High Sensitivity): <2 ng/L (ref <18)

## 2020-03-29 LAB — POC URINE PREG, ED: Preg Test, Ur: NEGATIVE

## 2020-03-29 LAB — BRAIN NATRIURETIC PEPTIDE: B Natriuretic Peptide: 30.4 pg/mL (ref 0.0–100.0)

## 2020-03-29 MED ORDER — IOHEXOL 350 MG/ML SOLN
75.0000 mL | Freq: Once | INTRAVENOUS | Status: AC | PRN
  Administered 2020-03-29: 75 mL via INTRAVENOUS

## 2020-03-29 NOTE — ED Notes (Signed)
Pt transported for vascular study

## 2020-03-29 NOTE — ED Provider Notes (Addendum)
[facility] EMERGENCY DEPARTMENT Provider Note   CSN: [id number] Arrival date & time: 03/29/20  0801     History Chief Complaint  Patient presents with  . Leg Pain    Sheila [name] is a 49 y.o. female with pertinent past medical history of panic attacks and anemia that presents to the emergency department today for bilateral leg swelling and chest pain.  States that her legs are extremely tender, especially under her knee.  Denies any trauma to that area.  States that this started 3 days ago.  States that this is never happened to her before.  Also admits to some chest pain that is sharp and comes and goes, has had this for a couple of days. States that it is non exertional and does not radiate. States that she has a lot of stress at home right now and feels as if she is stressed out. Is having to take care of grandchild who went missing a couple days ago. States that she is never had a heart attack before.  Denies any cardiac history. Per chart review, cardiac history negative except for abnormal stress echo in 2015, with normal cath following, normal EF.  Denies any shortness of breath, nausea, vomiting, abdominal pain, back pain.  States that she has not taken anything for any of her symptoms.  Denies any clotting history, chance of pregnancy, recent surgeries, does not use any tobacco products or estrogen products.  Denies any recent long travel.  Patient states that she gets up and moves around, does not live sedentary lifestyle. No pain right now.  HPI    Past Medical History:  Diagnosis Date  . Anemia   . Ejection fraction     Patient Active Problem List   Diagnosis Date Noted  . Chest pain at rest 05/30/2014  . Ejection fraction   . Abnormal EKG 04/24/2014  . Chest discomfort 04/23/2014  . Panic attack 04/23/2014  . OBESITY 01/15/2009  . HAIR LOSS 01/15/2009  . ANEMIA-NOS 01/14/2009    Past Surgical History:  Procedure Laterality Date  . LEFT HEART  CATHETERIZATION WITH CORONARY ANGIOGRAM N/A 05/30/2014   Procedure: LEFT HEART CATHETERIZATION WITH CORONARY ANGIOGRAM;  Surgeon: Peter M Martinique, MD;  Location: [facility] CATH LAB;  Service: Cardiovascular;  Laterality: N/A;  . TUBAL LIGATION       OB History    Gravida  8   Para  6   Term  4   Preterm  2   AB  2   Living  4     SAB  2   TAB      Ectopic      Multiple      Live Births              Family History  Problem Relation Age of Onset  . Hypertension Mother   . Heart Problems Mother   . Hyperlipidemia Mother   . Diabetes Father   . Seizures Father   . Hypertension Maternal Aunt   . Hypertension Maternal Uncle   . Cancer Maternal Grandmother   . Hypertension Maternal Grandmother   . Stroke Maternal Grandmother   . Stroke Maternal Grandfather     Social History   Tobacco Use  . Smoking status: Never Smoker  . Smokeless tobacco: Never Used  Substance Use Topics  . Alcohol use: No  . Drug use: No    Home Medications Prior to Admission medications   Medication Sig Start Date End  Date Taking? Authorizing Provider  albuterol (PROVENTIL HFA;VENTOLIN HFA) 108 (90 BASE) MCG/ACT inhaler Inhale 2 puffs into the lungs every 6 (six) hours as needed for wheezing or shortness of breath.   Yes [provider]  aspirin EC 325 MG tablet Take 325 mg by mouth daily as needed (pain).   Yes [provider]  ELDERBERRY PO Take 1 tablet by mouth every morning.   Yes [provider]  fluticasone (FLONASE) 50 MCG/ACT nasal spray Place 1 spray into both nostrils daily.   Yes [provider]  clindamycin (CLEOCIN) 300 MG capsule Take 1 capsule (300 mg total) by mouth 3 (three) times daily. Patient not taking: Reported on 03/29/2020 11/29/19   Raylene [name], MD    Allergies    Patient has no known allergies.  Review of Systems   Review of Systems  Constitutional: Negative for chills, diaphoresis, fatigue and fever.  HENT: Negative  for congestion, sore throat and trouble swallowing.   Eyes: Negative for pain and visual disturbance.  Respiratory: Negative for cough, shortness of breath and wheezing.   Cardiovascular: Positive for chest pain and leg swelling. Negative for palpitations.  Gastrointestinal: Negative for abdominal distention, abdominal pain, diarrhea, nausea and vomiting.  Genitourinary: Negative for difficulty urinating, dysuria and hematuria.  Musculoskeletal: Negative for back pain, neck pain and neck stiffness.  Skin: Negative for pallor.  Neurological: Negative for dizziness, speech difficulty, weakness and headaches.  Psychiatric/Behavioral: Negative for confusion.    Physical Exam Updated Vital Signs BP (!) 151/97   Pulse 76   Temp 98.2 F (36.8 C) (Oral)   Resp 18   SpO2 99%   Physical Exam Constitutional:      General: She is not in acute distress.    Appearance: Normal appearance. She is not ill-appearing, toxic-appearing or diaphoretic.  HENT:     Mouth/Throat:     Mouth: Mucous membranes are moist.     Pharynx: Oropharynx is clear.  Eyes:     General: No scleral icterus.    Extraocular Movements: Extraocular movements intact.     Pupils: Pupils are equal, round, and reactive to light.  Cardiovascular:     Rate and Rhythm: Normal rate and regular rhythm.     Pulses: Normal pulses.     Heart sounds: Normal heart sounds.  Pulmonary:     Effort: Pulmonary effort is normal. No respiratory distress.     Breath sounds: Normal breath sounds. No stridor. No wheezing, rhonchi or rales.  Chest:     Chest wall: No tenderness.  Abdominal:     General: Abdomen is flat. There is no distension.     Palpations: Abdomen is soft.     Tenderness: There is no abdominal tenderness. There is no guarding or rebound.  Musculoskeletal:        General: No swelling or tenderness. Normal range of motion.     Cervical back: Normal range of motion and neck supple. No rigidity.     Right lower leg: Edema  (1+ pitting until mid calf, no overlying erythema or induration. tender to touch. Nomral strength and sensation. Distal pulses 2+) present.     Left lower leg: Edema (1+ pitting until mid calf, no overlying erythema or induration. tender to touch. Nomral strength and sensation. Distal pulses 2+) present.  Skin:    General: Skin is warm and dry.     Capillary Refill: Capillary refill takes less than 2 seconds.     Coloration: Skin is not pale.  Neurological:     General: No focal deficit present.     Mental Status: She is alert and oriented to person, place, and time.     Cranial Nerves: No cranial nerve deficit.     Sensory: No sensory deficit.     Motor: No weakness.     Coordination: Coordination normal.     Gait: Gait normal.  Psychiatric:        Mood and Affect: Mood normal.        Behavior: Behavior normal.     ED Results / Procedures / Treatments   Labs (all labs ordered are listed, but only abnormal results are displayed) Labs Reviewed  COMPREHENSIVE METABOLIC PANEL - Abnormal; Notable for the following components:      Result Value   Glucose, Bld 106 (*)    Total Bilirubin 1.3 (*)    All other components within normal limits  CBC WITH DIFFERENTIAL/PLATELET - Abnormal; Notable for the following components:   WBC 3.9 (*)    All other components within normal limits  URINALYSIS, ROUTINE W REFLEX MICROSCOPIC - Abnormal; Notable for the following components:   APPearance CLOUDY (*)    Leukocytes,Ua TRACE (*)    Bacteria, UA MANY (*)    All other components within normal limits  D-DIMER, QUANTITATIVE (NOT AT [facility]) - Abnormal; Notable for the following components:   D-Dimer, Quant 0.62 (*)    All other components within normal limits  URINE CULTURE  BRAIN NATRIURETIC PEPTIDE  POC URINE PREG, ED  TROPONIN I (HIGH SENSITIVITY)  TROPONIN I (HIGH SENSITIVITY)    EKG EKG Interpretation  Date/Time:  Saturday Mar 29 2020 11:59:13 EDT Ventricular Rate:  65 PR Interval:      QRS Duration: 100 QT Interval:  386 QTC Calculation: 402 R Axis:   30 Text Interpretation: Sinus rhythm Borderline T abnormalities, lateral leads When compard to prior, similar. No STEMI Confirmed by Antony Blackbird [phone]) on 03/29/2020 12:05:27 PM   Radiology DG Chest 2 View  Result Date: 03/29/2020 CLINICAL DATA:  Chest pain EXAM: CHEST - 2 VIEW COMPARISON:  08/31/2019 FINDINGS: The heart size and mediastinal contours are within normal limits. Both lungs are clear. The visualized skeletal structures are unremarkable. IMPRESSION: No active cardiopulmonary disease. Electronically Signed   By: Inez Catalina M.D.   On: 03/29/2020 13:53   CT Angio Chest PE W/Cm &/Or Wo Cm  Result Date: 03/29/2020 CLINICAL DATA:  Chest pain and shortness of breath EXAM: CT ANGIOGRAPHY CHEST WITH CONTRAST TECHNIQUE: Multidetector CT imaging of the chest was performed using the standard protocol during bolus administration of intravenous contrast. Multiplanar CT image reconstructions and MIPs were obtained to evaluate the vascular anatomy. CONTRAST:  43mL OMNIPAQUE IOHEXOL 350 MG/ML SOLN COMPARISON:  Chest x-ray from earlier in the same day, CT from 08/31/2019 FINDINGS: Cardiovascular: Thoracic aorta is within normal limits. No atherosclerotic calcifications are identified. No cardiac enlargement is seen. The pulmonary artery shows a normal branching pattern. No filling defect to suggest pulmonary embolism is noted. Mediastinum/Nodes: Thoracic inlet is within normal limits. The esophagus is within normal limits as visualized. No hilar or mediastinal adenopathy is noted. Lungs/Pleura: Lungs are clear. No pleural effusion or pneumothorax. Upper Abdomen: Visualized upper abdomen is within normal limits. Musculoskeletal: No chest wall abnormality. No acute or significant osseous findings. Review of the MIP images confirms the above findings. IMPRESSION: No evidence of pulmonary emboli. No acute intrathoracic abnormality is  noted. Electronically Signed   By: Linus Mako.D.  On: 03/29/2020 16:07   VAS [place] LOWER EXTREMITY VENOUS (DVT) (ONLY [facility] & [facility])  Result Date: 03/29/2020  Lower Venous DVTStudy Indications: Pain, and Swelling.  Comparison Study: No prior study on file Performing Technologist: Sharion Dove RVS  Examination Guidelines: A complete evaluation includes B-mode imaging, spectral Doppler, color Doppler, and power Doppler as needed of all accessible portions of each vessel. Bilateral testing is considered an integral part of a complete examination. Limited examinations for reoccurring indications may be performed as noted. The reflux portion of the exam is performed with the patient in reverse Trendelenburg.  +---------+---------------+---------+-----------+----------+--------------+ RIGHT    CompressibilityPhasicitySpontaneityPropertiesThrombus Aging +---------+---------------+---------+-----------+----------+--------------+ CFV      Full           Yes      Yes                                 +---------+---------------+---------+-----------+----------+--------------+ SFJ      Full                                                        +---------+---------------+---------+-----------+----------+--------------+ FV Prox  Full                                                        +---------+---------------+---------+-----------+----------+--------------+ FV Mid   Full                                                        +---------+---------------+---------+-----------+----------+--------------+ FV DistalFull                                                        +---------+---------------+---------+-----------+----------+--------------+ PFV      Full                                                        +---------+---------------+---------+-----------+----------+--------------+ POP      Full           Yes      Yes                                  +---------+---------------+---------+-----------+----------+--------------+ PTV      Full                                                        +---------+---------------+---------+-----------+----------+--------------+ PERO     Full                                                        +---------+---------------+---------+-----------+----------+--------------+   +---------+---------------+---------+-----------+----------+--------------+  LEFT     CompressibilityPhasicitySpontaneityPropertiesThrombus Aging +---------+---------------+---------+-----------+----------+--------------+ CFV      Full           Yes      Yes                                 +---------+---------------+---------+-----------+----------+--------------+ SFJ      Full                                                        +---------+---------------+---------+-----------+----------+--------------+ FV Prox  Full                                                        +---------+---------------+---------+-----------+----------+--------------+ FV Mid   Full                                                        +---------+---------------+---------+-----------+----------+--------------+ FV DistalFull                                                        +---------+---------------+---------+-----------+----------+--------------+ PFV      Full                                                        +---------+---------------+---------+-----------+----------+--------------+ POP      Full           Yes      Yes                                 +---------+---------------+---------+-----------+----------+--------------+ PTV      Full                                                        +---------+---------------+---------+-----------+----------+--------------+ PERO     Full                                                         +---------+---------------+---------+-----------+----------+--------------+     Summary: BILATERAL: - No evidence of deep vein thrombosis seen in the lower extremities, bilaterally. -   *See table(s) above for measurements and observations.    Preliminary     Procedures Procedures (including critical care time)  Medications Ordered in  ED Medications  iohexol (OMNIPAQUE) 350 MG/ML injection 75 mL (75 mLs Intravenous Contrast Given 03/29/20 1555)    ED Course  I have reviewed the triage vital signs and the nursing notes.  Pertinent labs & imaging results that were available during my care of the patient were reviewed by me and considered in my medical decision making (see chart for details).    MDM Rules/Calculators/A&P HEAR Score: 3                   Sheila [name] is a 49 y.o. female with pertinent past medical history of panic attacks and anemia that presents to the emergency department today for bilateral leg swelling and chest pain. The emergent causes of chest pain include: Acute coronary syndrome, tamponade, pericarditis/myocarditis, aortic dissection, pulmonary embolism, tension pneumothorax, pneumonia, and esophageal rupture. PE with 1+ pitting edema on legs bilaterally, no overlying sings of infection.   Initial interventions none - pt is not in pain right now.  ECG interpreted by me demonstrated normal sinus.  CXR interpreted by me demonstrated no cardiopulmonary disease. Venous US of bilateral legs negative for DVT.  Labs demonstrated normal CBC, CMP, two negative troponins. D-Dimer elevated, will obtain CT to r/o PE.  CT PE study negative.   Upon reassessment pt is less anxious after hearing all tests were negative. Ready for discharge.   Given the above findings, my suspicion is that has chronic venous insufficiency, explained that she needs to wear compression stockings and elevate legs. Printed out guide for her to read on this with follow up with cardiology. Chest  pain most likley due to anxiety. Pt agrees with this and also thinks this is the cause of her chest pain.  HEAR score 3.   Patient is to be discharged with recommendation to follow up with PCP in regards to today's hospital visit. Chest pain is not likely of cardiac or pulmonary etiology d/t presentation, PERC negative, VSS, no tracheal deviation, no JVD or new murmur, RRR, breath sounds equal bilaterally, EKG without acute abnormalities, negative troponin, and negative CXR. Pt has been advised to return to the ED if CP becomes exertional, associated with diaphoresis or nausea, radiates to left jaw/arm, worsens or becomes concerning in any way. Pt appears reliable for follow up and is agreeable to discharge.   Case has been discussed with and seen by Dr. Sherry Ruffing who agrees with the above plan to discharge.     Final Clinical Impression(s) / ED Diagnoses Final diagnoses:  Bilateral leg pain  Chest pain, unspecified type    Rx / DC Orders ED Discharge Orders    None       Alfredia Client, [name] 03/29/20 1824    Alfredia Client, [name] 03/29/20 1825    Tegeler, Gwenyth Allegra, MD 03/29/20 [phone]

## 2020-03-29 NOTE — Discharge Instructions (Addendum)
You were seen today for bilateral leg pain/swelling and some chest pain.  All of your labs and CT scan and ultrasound are reassuring.  They do not show any clots.  Your leg swelling and leg pain is probably due to chronic venous insufficiency.  I attached a guide for you to read about that.  I want you to elevate your legs as much as you can and wear compression stockings.  In regards to your chest pain, this could be due to anxiety as we spoke about.  I also provided a guide about nonspecific chest pain that you can read about.  Please come back to the emergency department if you have any worsening chest pain on exertion, shortness of breath, trouble walking, weakness, lightheadedness.  Want you to follow-up with cardiology for this, provided their number. I sent out for a urine culture, if this is positive someone will contact you in a couple of days.I want you to read the guide about anxiety as well.

## 2020-03-29 NOTE — Progress Notes (Signed)
VASCULAR LAB PRELIMINARY  PRELIMINARY  PRELIMINARY  PRELIMINARY  Bilateral lower extremity venous duplex completed.    Preliminary report:  See CV proc for preliminary results.  Called report to Dr. Emelda Fear, [place], RVT 03/29/2020, 1:15 PM

## 2020-03-29 NOTE — ED Triage Notes (Signed)
Pt reports swelling and pain to bilateral legs x3 days, denies any other symptoms, reports hx of some cardiac issues but states she is not on any diuretics at this time or other cardiac meds.

## 2020-03-29 NOTE — ED Notes (Signed)
Pt transported to CT ?

## 2020-03-29 NOTE — ED Notes (Signed)
Pt d/c home per MD order. Discharge summary reviewed with pt, pt verbalizes understanding. Off unit via WC. Reports d/c ride home

## 2020-03-30 LAB — URINE CULTURE: Culture: >=100000 — AB

## 2020-04-04 ENCOUNTER — Telehealth: Payer: Self-pay | Admitting: General Practice

## 2020-04-04 NOTE — Telephone Encounter (Signed)
   Pt was discharge at ED and referred to Dr. Rayann Heman for f/u, connect call to [facility]

## 2020-04-15 ENCOUNTER — Ambulatory Visit: Payer: Medicaid Other | Admitting: Internal Medicine

## 2020-04-25 ENCOUNTER — Ambulatory Visit (INDEPENDENT_AMBULATORY_CARE_PROVIDER_SITE_OTHER): Payer: Medicaid Other | Admitting: Internal Medicine

## 2020-04-25 ENCOUNTER — Encounter: Payer: Self-pay | Admitting: Internal Medicine

## 2020-04-25 ENCOUNTER — Other Ambulatory Visit: Payer: Self-pay

## 2020-04-25 VITALS — BP 134/88 | HR 84 | Ht 69.0 in | Wt 210.6 lb

## 2020-04-25 DIAGNOSIS — E669 Obesity, unspecified: Secondary | ICD-10-CM

## 2020-04-25 DIAGNOSIS — R072 Precordial pain: Secondary | ICD-10-CM | POA: Diagnosis not present

## 2020-04-25 DIAGNOSIS — R7301 Impaired fasting glucose: Secondary | ICD-10-CM

## 2020-04-25 DIAGNOSIS — R9431 Abnormal electrocardiogram [ECG] [EKG]: Secondary | ICD-10-CM

## 2020-04-25 DIAGNOSIS — R079 Chest pain, unspecified: Secondary | ICD-10-CM | POA: Diagnosis not present

## 2020-04-25 DIAGNOSIS — Z6830 Body mass index (BMI) 30.0-30.9, adult: Secondary | ICD-10-CM

## 2020-04-25 MED ORDER — METOPROLOL TARTRATE 50 MG PO TABS
100.0000 mg | ORAL_TABLET | Freq: Once | ORAL | 0 refills | Status: DC
Start: 2020-04-25 — End: 2021-04-15

## 2020-04-25 NOTE — Patient Instructions (Addendum)
Medication Instructions:   No changes  *If you need a refill on your cardiac medications before your next appointment, please call your pharmacy*   Lab Work:  lipid [id number]  bmp   If you have labs (blood work) drawn today and your tests are completely normal, you will receive your results only by: Marland Kitchen MyChart Message (if you have MyChart) OR . A paper copy in the mail If you have any lab test that is abnormal or we need to change your treatment, we will call you to review the results.   Testing/Procedures:  will be schedule at [facility] at Radiology dept  Once approved by insurance Coronary CTA    Follow-Up: At [facility], you and your health needs are our priority.  As part of our continuing mission to provide you with exceptional heart care, we have created designated Provider Care Teams.  These Care Teams include your primary Cardiologist (physician) and Advanced Practice Providers (APPs -  Physician Assistants and Nurse Practitioners) who all work together to provide you with the care you need, when you need it.  We recommend signing up for the patient portal called "MyChart".  Sign up information is provided on this After Visit Summary.  MyChart is used to connect with patients for Virtual Visits (Telemedicine).  Patients are able to view lab/test results, encounter notes, upcoming appointments, etc.  Non-urgent messages can be sent to your provider as well.   To learn more about what you can do with MyChart, go to [email or web address].    Your next appointment:   2 month(s)  The format for your next appointment:   In Person  Provider:   Cherlynn Kaiser, MD    Your cardiac CT will be scheduled at  the below locations:   [facility] [address] [place], [place] [address] [phone]    If scheduled at [facility], please [facility] of [facility] 30 minutes prior to test start time. Proceed  to the [facility] Radiology Department (first floor) to check-in and test prep.   Please follow these instructions carefully (unless otherwise directed):  Please  Do  Labs at least one week  Prior to test   On the Night Before the Test: . Be sure to Drink plenty of water. . Do not consume any caffeinated/decaffeinated beverages or chocolate 12 hours prior to your test. . Do not take any antihistamines 12 hours prior to your test.  On the Day of the Test: . Drink plenty of water. Do not drink any water within one hour of the test. . Do not eat any food 4 hours prior to the test. . You may take your regular medications prior to the test.  . Take metoprolol (Lopressor) 100 mg  two hours prior to test. . FEMALES- please wear underwire-free bra if available          After the Test: . Drink plenty of water. . After receiving IV contrast, you may experience a mild flushed feeling. This is normal. . On occasion, you may experience a mild rash up to 24 hours after the test. This is not dangerous. If this occurs, you can take Benadryl 25 mg and increase your fluid intake. . If you experience trouble breathing, this can be serious. If it is severe call 911 IMMEDIATELY. If it is mild, please call our office.   Once we have confirmed authorization from your insurance company, we will call you  to set up a date and time for your test.   For non-scheduling related questions, please contact the cardiac imaging nurse navigator should you have any questions/concerns: Marchia Bond, Cardiac Imaging Nurse Navigator Burley Saver, Interim Cardiac Imaging Nurse [place] [facility] Direct Office Dial: [phone]   For scheduling needs, including cancellations and rescheduling, please call [phone].

## 2020-04-25 NOTE — Progress Notes (Signed)
Cardiology Office Note:    Date:  04/25/2020   ID:  Sheila [name], DOB Dec 14, 1970, MRN [id number]  PCP:  Patient, No Pcp Per  Cardiologist:  No primary care provider on file.  Electrophysiologist:  None   Referring MD: Lucrezia Starch, MD   Chief Complaint: chest pain   History of Present Illness:    Sheila [name] is a 49 y.o. female with a history of anemia and no significant cardiovascular history who presents for chest pain.  She has a significant amount of stress in her life per her report, and has been noticing "an air bubble in her chest" on a monthly basis for several months, lasting several minutes at a time. She describes a substernal chest pressure particularly in times of stress. Occurs with rest but worsened by exertion. She will take alka seltzer and it will help her symptoms. She has a strong family history of heart disease. Nonsmoker, nondiabetic to her knowledge. Has mild shortness of breath.   Presented in 2015 with chest pain, had an abnormal dobutamine stress echo, underwent LHC with normal coronary arteries. Felt to be related to possible GI source or keloid scar on chest.   The patient denies palpitations, PND, orthopnea, or leg swelling. Denies cough, fever, chills. Denies nausea, vomiting. Denies syncope or presyncope. Denies dizziness or lightheadedness. Denies snoring.  Past Medical History:  Diagnosis Date  . Anemia     Past Surgical History:  Procedure Laterality Date  . LEFT HEART CATHETERIZATION WITH CORONARY ANGIOGRAM N/A 05/30/2014   Procedure: LEFT HEART CATHETERIZATION WITH CORONARY ANGIOGRAM;  Surgeon: Peter M Martinique, MD;  Location: [facility] CATH LAB;  Service: Cardiovascular;  Laterality: N/A;  . TUBAL LIGATION      Current Medications: Current Meds  Medication Sig  . albuterol (PROVENTIL HFA;VENTOLIN HFA) 108 (90 BASE) MCG/ACT inhaler Inhale 2 puffs into the lungs every 6 (six) hours as needed for wheezing or shortness of breath.  Marland Kitchen aspirin  EC 325 MG tablet Take 325 mg by mouth daily as needed (pain).  . clindamycin (CLEOCIN) 300 MG capsule Take 1 capsule (300 mg total) by mouth 3 (three) times daily.  Marland Kitchen ELDERBERRY PO Take 1 tablet by mouth every morning.  . fluticasone (FLONASE) 50 MCG/ACT nasal spray Place 1 spray into both nostrils daily.     Allergies:   Patient has no known allergies.   Social History   Socioeconomic History  . Marital status: Married    Spouse name: Not on file  . Number of children: Not on file  . Years of education: Not on file  . Highest education level: Not on file  Occupational History  . Not on file  Tobacco Use  . Smoking status: Never Smoker  . Smokeless tobacco: Never Used  Substance and Sexual Activity  . Alcohol use: No  . Drug use: No  . Sexual activity: Yes    Birth control/protection: Condom, Surgical  Other Topics Concern  . Not on file  Social History Narrative  . Not on file   Social Determinants of Health   Financial Resource Strain:   . Difficulty of Paying Living Expenses: Not on file  Food Insecurity:   . Worried About Charity fundraiser in the Last Year: Not on file  . Ran Out of Food in the Last Year: Not on file  Transportation Needs:   . Lack of Transportation (Medical): Not on file  . Lack of Transportation (Non-Medical): Not on file  Physical Activity:   .  Days of Exercise per Week: Not on file  . Minutes of Exercise per Session: Not on file  Stress:   . Feeling of Stress : Not on file  Social Connections:   . Frequency of Communication with Friends and Family: Not on file  . Frequency of Social Gatherings with Friends and Family: Not on file  . Attends Religious Services: Not on file  . Active Member of Clubs or Organizations: Not on file  . Attends Archivist Meetings: Not on file  . Marital Status: Not on file     Family History: The patient's family history includes Cancer in her maternal grandmother; Diabetes in her father; Heart  Problems in her mother; Hyperlipidemia in her mother; Hypertension in her maternal aunt, maternal grandmother, maternal uncle, and mother; Seizures in her father; Stroke in her maternal grandfather and maternal grandmother.  ROS:   Please see the history of present illness.    All other systems reviewed and are negative.  EKGs/Labs/Other Studies Reviewed:    The following studies were reviewed today:  EKG:  03/29/20 - T wave abnormality in lateral leads, NSR.  I have independently reviewed the images from CT angio chest PE 03/29/20 - no definite coronary artery calcifications.  Recent Labs: 03/29/2020: ALT 18; B Natriuretic Peptide 30.4; Hemoglobin 12.3; Platelets 303 06/30/2020: BUN 8; Creatinine, Ser 0.93; Potassium 4.7; Sodium 143  Recent Lipid Panel    Component Value Date/Time   CHOL 213 (H) 06/30/2020 1553   TRIG 93 06/30/2020 1553   HDL 55 06/30/2020 1553   CHOLHDL 3.9 06/30/2020 1553   CHOLHDL 3.4 05/31/2014 0433   VLDL 13 05/31/2014 0433   LDLCALC 141 (H) 06/30/2020 1553    Physical Exam:    VS:  BP 134/88   Pulse 84   Ht _0  (1.753 m)   Wt 210 lb 9.6 oz (95.5 kg)   SpO2 99%   BMI 31.10 kg/m     Wt Readings from Last 5 Encounters:  06/30/20 209 lb (94.8 kg)  04/25/20 210 lb 9.6 oz (95.5 kg)  11/29/19 221 lb 6.4 oz (100.4 kg)  10/11/16 180 lb (81.6 kg)  07/29/16 188 lb (85.3 kg)     Constitutional: No acute distress Eyes: sclera non-icteric, normal conjunctiva and lids ENMT: normal dentition, moist mucous membranes Cardiovascular: regular rhythm, normal rate, no murmurs. S1 and S2 normal. Radial pulses normal bilaterally. No jugular venous distention.  Respiratory: clear to auscultation bilaterally GI : normal bowel sounds, soft and nontender. No distention.   MSK: extremities warm, well perfused. No edema.  NEURO: grossly nonfocal exam, moves all extremities. PSYCH: alert and oriented x 3, normal mood and affect.   ASSESSMENT:    1. Precordial pain     2. Chest pain at rest   3. Abnormal EKG   4. Class 1 obesity without serious comorbidity with body mass index (BMI) of 30.0 to 30.9 in adult, unspecified obesity type   5. Impaired fasting glucose    PLAN:    Chest pain - concern for epicardial coronary artery disease with strong family history of CAD and chest pressure that is substernal and provoked by emotional stressors. Will obtain Coronary CTA and Echocardiogram for chest pain to evaluate wall motion as well as the pericardium.   Impaired fasting glucose - will check HbA1c and lipids to risk stratify.   Total time of encounter: 45 minutes total time of encounter, including 25 minutes spent in face-to-face patient care on the date of  this encounter. This time includes coordination of care and counseling regarding above mentioned problem list. Remainder of non-face-to-face time involved reviewing chart documents/testing relevant to the patient encounter and documentation in the medical record. I have independently reviewed documentation from referring provider.   Cherlynn Kaiser, MD [place]  [facility]    Medication Adjustments/Labs and Tests Ordered: Current medicines are reviewed at length with the patient today.  Concerns regarding medicines are outlined above.  Orders Placed This Encounter  Procedures  . CT CORONARY MORPH W/CTA COR W/SCORE W/CA W/CM &/OR WO/CM  . CT CORONARY FRACTIONAL FLOW RESERVE DATA PREP  . CT CORONARY FRACTIONAL FLOW RESERVE FLUID ANALYSIS  . Basic metabolic panel  . Lipid panel  . Hemoglobin A1c  . ECHOCARDIOGRAM COMPLETE   Meds ordered this encounter  Medications  . metoprolol tartrate (LOPRESSOR) 50 MG tablet    Sig: Take 2 tablets (100 mg total) by mouth once for 1 dose. TAKE TWO HOURS PRIOR TO  SCHEDULE CARDIAC TEST    Dispense:  2 tablet    Refill:  0    Patient Instructions  Medication Instructions:   No changes  *If you need a refill on your cardiac medications before your  next appointment, please call your pharmacy*   Lab Work:  lipid [id number]  bmp   If you have labs (blood work) drawn today and your tests are completely normal, you will receive your results only by: Marland Kitchen MyChart Message (if you have MyChart) OR . A paper copy in the mail If you have any lab test that is abnormal or we need to change your treatment, we will call you to review the results.   Testing/Procedures:  will be schedule at [facility] at Radiology dept  Once approved by insurance Coronary CTA    Follow-Up: At [facility], you and your health needs are our priority.  As part of our continuing mission to provide you with exceptional heart care, we have created designated Provider Care Teams.  These Care Teams include your primary Cardiologist (physician) and Advanced Practice Providers (APPs -  Physician Assistants and Nurse Practitioners) who all work together to provide you with the care you need, when you need it.  We recommend signing up for the patient portal called "MyChart".  Sign up information is provided on this After Visit Summary.  MyChart is used to connect with patients for Virtual Visits (Telemedicine).  Patients are able to view lab/test results, encounter notes, upcoming appointments, etc.  Non-urgent messages can be sent to your provider as well.   To learn more about what you can do with MyChart, go to [email or web address].    Your next appointment:   2 month(s)  The format for your next appointment:   In Person  Provider:   Cherlynn Kaiser, MD    Your cardiac CT will be scheduled at  the below locations:   [facility] [address] [place], [place] [address] [phone]    If scheduled at [facility], please arrive at the [facility] main entrance of [facility] 30 minutes prior to test start time. Proceed to the [facility] Radiology Department (first floor) to check-in and test prep.   Please follow these  instructions carefully (unless otherwise directed):  Please  Do  Labs at least one week  Prior to test   On the Night Before the Test: . Be sure to Drink plenty of water. . Do not consume any caffeinated/decaffeinated beverages or  chocolate 12 hours prior to your test. . Do not take any antihistamines 12 hours prior to your test.  On the Day of the Test: . Drink plenty of water. Do not drink any water within one hour of the test. . Do not eat any food 4 hours prior to the test. . You may take your regular medications prior to the test.  . Take metoprolol (Lopressor) 100 mg  two hours prior to test. . FEMALES- please wear underwire-free bra if available          After the Test: . Drink plenty of water. . After receiving IV contrast, you may experience a mild flushed feeling. This is normal. . On occasion, you may experience a mild rash up to 24 hours after the test. This is not dangerous. If this occurs, you can take Benadryl 25 mg and increase your fluid intake. . If you experience trouble breathing, this can be serious. If it is severe call 911 IMMEDIATELY. If it is mild, please call our office.   Once we have confirmed authorization from your insurance company, we will call you to set up a date and time for your test.   For non-scheduling related questions, please contact the cardiac imaging nurse navigator should you have any questions/concerns: Marchia Bond, Cardiac Imaging Nurse Navigator Burley Saver, Interim Cardiac Imaging Nurse [place] [facility] Direct Office Dial: [phone]   For scheduling needs, including cancellations and rescheduling, please call [phone].

## 2020-05-15 ENCOUNTER — Other Ambulatory Visit: Payer: Self-pay

## 2020-05-15 ENCOUNTER — Ambulatory Visit (HOSPITAL_COMMUNITY): Payer: Medicaid Other | Attending: Cardiology

## 2020-05-15 DIAGNOSIS — R072 Precordial pain: Secondary | ICD-10-CM | POA: Diagnosis present

## 2020-05-21 ENCOUNTER — Telehealth: Payer: Self-pay | Admitting: *Deleted

## 2020-05-21 NOTE — Telephone Encounter (Signed)
Left message for patient to call back for echo results

## 2020-05-21 NOTE — Telephone Encounter (Signed)
The patient has been notified of the result and verbalized understanding.  All questions (if any) were answered. Aware to keep appt 06/30/20 Raiford Simmonds, RN 05/21/2020 1:27 PM

## 2020-05-21 NOTE — Telephone Encounter (Signed)
-----   Message from Elouise [name], MD sent at 05/16/2020  5:15 PM EDT ----- Normal pumping function with mildly increased wall thickness which can come from high blood pressure. Let's talk about this more when we meet next, nothing urgent to do, results look great overall!

## 2020-05-23 ENCOUNTER — Telehealth (HOSPITAL_COMMUNITY): Payer: Self-pay | Admitting: *Deleted

## 2020-05-23 NOTE — Telephone Encounter (Signed)

## 2020-05-26 ENCOUNTER — Ambulatory Visit (HOSPITAL_COMMUNITY)
Admission: RE | Admit: 2020-05-26 | Discharge: 2020-05-26 | Disposition: A | Discharge status: Home / Self Care | Payer: Medicaid Other | Source: Ambulatory Visit | Attending: Internal Medicine | Admitting: Internal Medicine

## 2020-05-26 ENCOUNTER — Other Ambulatory Visit: Payer: Self-pay

## 2020-05-26 DIAGNOSIS — R072 Precordial pain: Secondary | ICD-10-CM | POA: Diagnosis not present

## 2020-05-26 MED ORDER — NITROGLYCERIN 0.4 MG SL SUBL
SUBLINGUAL_TABLET | SUBLINGUAL | Status: AC
  Filled 2020-05-26: qty 2

## 2020-05-26 MED ORDER — NITROGLYCERIN 0.4 MG SL SUBL
0.8000 mg | SUBLINGUAL_TABLET | Freq: Once | SUBLINGUAL | Status: AC
  Administered 2020-05-26: 0.8 mg via SUBLINGUAL

## 2020-05-26 MED ORDER — IOHEXOL 350 MG/ML SOLN
80.0000 mL | Freq: Once | INTRAVENOUS | Status: AC | PRN
  Administered 2020-05-26: 80 mL via INTRAVENOUS

## 2020-05-29 ENCOUNTER — Telehealth: Payer: Self-pay | Admitting: *Deleted

## 2020-05-29 NOTE — Telephone Encounter (Signed)
Left message to call back  

## 2020-05-29 NOTE — Telephone Encounter (Signed)
Patient returning call.

## 2020-05-29 NOTE — Telephone Encounter (Signed)
The patient has been notified of the result and verbalized understanding.  All questions (if any) were answered. Aware to keep appointment  Raiford Simmonds, RN 05/29/2020 2:05 PM

## 2020-05-29 NOTE — Telephone Encounter (Signed)
-----   Message from Elouise [name], MD sent at 05/27/2020 11:15 AM EDT ----- No significant CAD, LV walls also appear essentially normal thickness. Reassuring results.

## 2020-06-09 ENCOUNTER — Telehealth: Payer: Self-pay | Admitting: Licensed Clinical Social Worker

## 2020-06-09 NOTE — Telephone Encounter (Signed)
CSW received referral to assist patient with [employer] bill as she is unable to move into a new home until previous balance is paid in full. CSW assisted with [employer] to cover [employer] bill and patient will follow up with getting service started at the new address. Patient grateful for the support and will reach out to CSW if further needs arise.Sheila [name], [place], [place]

## 2020-06-30 ENCOUNTER — Ambulatory Visit (INDEPENDENT_AMBULATORY_CARE_PROVIDER_SITE_OTHER): Payer: Medicaid Other | Admitting: Internal Medicine

## 2020-06-30 ENCOUNTER — Encounter: Payer: Self-pay | Admitting: Internal Medicine

## 2020-06-30 ENCOUNTER — Other Ambulatory Visit: Payer: Self-pay

## 2020-06-30 VITALS — BP 110/80 | HR 89 | Ht 69.0 in | Wt 209.0 lb

## 2020-06-30 DIAGNOSIS — Z6830 Body mass index (BMI) 30.0-30.9, adult: Secondary | ICD-10-CM

## 2020-06-30 DIAGNOSIS — R072 Precordial pain: Secondary | ICD-10-CM

## 2020-06-30 DIAGNOSIS — R9431 Abnormal electrocardiogram [ECG] [EKG]: Secondary | ICD-10-CM

## 2020-06-30 DIAGNOSIS — R7301 Impaired fasting glucose: Secondary | ICD-10-CM | POA: Diagnosis not present

## 2020-06-30 DIAGNOSIS — E669 Obesity, unspecified: Secondary | ICD-10-CM

## 2020-06-30 DIAGNOSIS — E7800 Pure hypercholesterolemia, unspecified: Secondary | ICD-10-CM

## 2020-06-30 NOTE — Progress Notes (Signed)
Cardiology Office Note:    Date:  06/30/2020   ID:  Sheila [name], DOB 1971-11-10, MRN [id number]  PCP:  Patient, No Pcp Per  Cardiologist:  No primary care provider on file.  Electrophysiologist:  None   Referring MD: No ref. provider found   Chief Complaint: chest pressure follow up  History of Present Illness:    DHANA TOTTON is a 49 y.o. female with a history of anemia and impaired fasting glucose who presents for follow up.   No significant recurrence of symptoms. Discussed reassuring test results. Echo was normal but showed possible LVH, which was less evident on CCTA, wall thickness measured normal. Minimal CAD.  She continues to describe significant life stress. Her daughter is present for the visit and concurs. We discussed stress management strategies.  Past Medical History:  Diagnosis Date  . Anemia     Past Surgical History:  Procedure Laterality Date  . LEFT HEART CATHETERIZATION WITH CORONARY ANGIOGRAM N/A 05/30/2014   Procedure: LEFT HEART CATHETERIZATION WITH CORONARY ANGIOGRAM;  Surgeon: Peter M Martinique, MD;  Location: [facility] CATH LAB;  Service: Cardiovascular;  Laterality: N/A;  . TUBAL LIGATION      Current Medications: Current Meds  Medication Sig  . albuterol (PROVENTIL HFA;VENTOLIN HFA) 108 (90 BASE) MCG/ACT inhaler Inhale 2 puffs into the lungs every 6 (six) hours as needed for wheezing or shortness of breath.  Marland Kitchen aspirin EC 325 MG tablet Take 325 mg by mouth daily as needed (pain).  . clindamycin (CLEOCIN) 300 MG capsule Take 1 capsule (300 mg total) by mouth 3 (three) times daily.  Marland Kitchen ELDERBERRY PO Take 1 tablet by mouth every morning.  . fluticasone (FLONASE) 50 MCG/ACT nasal spray Place 1 spray into both nostrils daily.     Allergies:   Patient has no known allergies.   Social History   Socioeconomic History  . Marital status: Married    Spouse name: Not on file  . Number of children: Not on file  . Years of education: Not on file  .  Highest education level: Not on file  Occupational History  . Not on file  Tobacco Use  . Smoking status: Never Smoker  . Smokeless tobacco: Never Used  Substance and Sexual Activity  . Alcohol use: No  . Drug use: No  . Sexual activity: Yes    Birth control/protection: Condom, Surgical  Other Topics Concern  . Not on file  Social History Narrative  . Not on file   Social Determinants of Health   Financial Resource Strain:   . Difficulty of Paying Living Expenses: Not on file  Food Insecurity:   . Worried About Charity fundraiser in the Last Year: Not on file  . Ran Out of Food in the Last Year: Not on file  Transportation Needs:   . Lack of Transportation (Medical): Not on file  . Lack of Transportation (Non-Medical): Not on file  Physical Activity:   . Days of Exercise per Week: Not on file  . Minutes of Exercise per Session: Not on file  Stress:   . Feeling of Stress : Not on file  Social Connections:   . Frequency of Communication with Friends and Family: Not on file  . Frequency of Social Gatherings with Friends and Family: Not on file  . Attends Religious Services: Not on file  . Active Member of Clubs or Organizations: Not on file  . Attends Archivist Meetings: Not on file  . Marital  Status: Not on file     Family History: The patient's family history includes Cancer in her maternal grandmother; Diabetes in her father; Heart Problems in her mother; Hyperlipidemia in her mother; Hypertension in her maternal aunt, maternal grandmother, maternal uncle, and mother; Seizures in her father; Stroke in her maternal grandfather and maternal grandmother.  ROS:   Please see the history of present illness.    All other systems reviewed and are negative.  EKGs/Labs/Other Studies Reviewed:    The following studies were reviewed today:  Recent Labs: 03/29/2020: ALT 18; B Natriuretic Peptide 30.4; Hemoglobin 12.3; Platelets 303 06/30/2020: BUN 8; Creatinine, Ser  0.93; Potassium 4.7; Sodium 143  Recent Lipid Panel    Component Value Date/Time   CHOL 213 (H) 06/30/2020 1553   TRIG 93 06/30/2020 1553   HDL 55 06/30/2020 1553   CHOLHDL 3.9 06/30/2020 1553   CHOLHDL 3.4 05/31/2014 0433   VLDL 13 05/31/2014 0433   LDLCALC 141 (H) 06/30/2020 1553    Physical Exam:    VS:  BP 110/80   Pulse 89   Ht 5\' 9"  (1.753 m)   Wt 209 lb (94.8 kg)   SpO2 98%   BMI 30.86 kg/m     Wt Readings from Last 5 Encounters:  06/30/20 209 lb (94.8 kg)  04/25/20 210 lb 9.6 oz (95.5 kg)  11/29/19 221 lb 6.4 oz (100.4 kg)  10/11/16 180 lb (81.6 kg)  07/29/16 188 lb (85.3 kg)     Constitutional: No acute distress Eyes: sclera non-icteric, normal conjunctiva and lids ENMT: normal dentition, moist mucous membranes Cardiovascular: regular rhythm, normal rate, no murmurs. S1 and S2 normal. Radial pulses normal bilaterally. No jugular venous distention.  Respiratory: clear to auscultation bilaterally GI : normal bowel sounds, soft and nontender. No distention.   MSK: extremities warm, well perfused. No edema.  NEURO: grossly nonfocal exam, moves all extremities. PSYCH: alert and oriented x 3, normal mood and affect.   ASSESSMENT:    1. Precordial pain   2. Abnormal EKG   3. Class 1 obesity without serious comorbidity with body mass index (BMI) of 30.0 to 30.9 in adult, unspecified obesity type   4. Impaired fasting glucose    PLAN:    Chest pain - discussed reassuring test results, as well as testing from 2015. Chest pressure is likely extracardiac.  Abnormal ECG - does not represent epicardial CAD.   Weight management - discussed diet and exercise recommendations for stress management and cardiovascular health.  IFG - was not able to obtain labs from last visit. Requesting lipid panel and HbA1C today - will perform.   ADDENDUM: HLD - LDL 141 - recommend statin therapy crestor 5 mg daily. If patient would like to try diet and exercise that is reasonable  for 3-6 months, if LDL still elevated would start statin therapy.   Total time of encounter: 30 minutes total time of encounter, including 20 minutes spent in face-to-face patient care on the date of this encounter. This time includes coordination of care and counseling regarding above mentioned problem list. Remainder of non-face-to-face time involved reviewing chart documents/testing relevant to the patient encounter and documentation in the medical record. I have independently reviewed documentation from referring provider.   Cherlynn Kaiser, MD [place]  [facility]    Medication Adjustments/Labs and Tests Ordered: Current medicines are reviewed at length with the patient today.  Concerns regarding medicines are outlined above.  No orders of the defined types were placed in this  encounter.  No orders of the defined types were placed in this encounter.   Patient Instructions  Medication Instructions:  CONTINUE WITH CURRENT MEDICATIONS. NO CHANGES.  *If you need a refill on your cardiac medications before your next appointment, please call your pharmacy*  Follow-Up: At [facility], you and your health needs are our priority.  As part of our continuing mission to provide you with exceptional heart care, we have created designated Provider Care Teams.  These Care Teams include your primary Cardiologist (physician) and Advanced Practice Providers (APPs -  Physician Assistants and Nurse Practitioners) who all work together to provide you with the care you need, when you need it.  We recommend signing up for the patient portal called "MyChart".  Sign up information is provided on this After Visit Summary.  MyChart is used to connect with patients for Virtual Visits (Telemedicine).  Patients are able to view lab/test results, encounter notes, upcoming appointments, etc.  Non-urgent messages can be sent to your provider as well.   To learn more about what you can do with MyChart, go to  [email or web address].    Your next appointment:   12 month(s)  The format for your next appointment:   In Person  Provider:   Cherlynn Kaiser, MD

## 2020-06-30 NOTE — Patient Instructions (Signed)
Medication Instructions:  CONTINUE WITH CURRENT MEDICATIONS. NO CHANGES.  *If you need a refill on your cardiac medications before your next appointment, please call your pharmacy*  Follow-Up: At [facility], you and your health needs are our priority.  As part of our continuing mission to provide you with exceptional heart care, we have created designated Provider Care Teams.  These Care Teams include your primary Cardiologist (physician) and Advanced Practice Providers (APPs -  Physician Assistants and Nurse Practitioners) who all work together to provide you with the care you need, when you need it.  We recommend signing up for the patient portal called "MyChart".  Sign up information is provided on this After Visit Summary.  MyChart is used to connect with patients for Virtual Visits (Telemedicine).  Patients are able to view lab/test results, encounter notes, upcoming appointments, etc.  Non-urgent messages can be sent to your provider as well.   To learn more about what you can do with MyChart, go to [email or web address].    Your next appointment:   12 month(s)  The format for your next appointment:   In Person  Provider:   Cherlynn Kaiser, MD

## 2020-07-01 LAB — BASIC METABOLIC PANEL
BUN/Creatinine Ratio: 9 (ref 9–23)
BUN: 8 mg/dL (ref 6–24)
CO2: 24 mmol/L (ref 20–29)
Calcium: 9.5 mg/dL (ref 8.7–10.2)
Chloride: 106 mmol/L (ref 96–106)
Creatinine, Ser: 0.93 mg/dL (ref 0.57–1.00)
GFR calc Af Amer: 82 mL/min/{1.73_m2} (ref >59)
GFR calc non Af Amer: 71 mL/min/{1.73_m2} (ref >59)
Glucose: 88 mg/dL (ref 65–99)
Potassium: 4.7 mmol/L (ref 3.5–5.2)
Sodium: 143 mmol/L (ref 134–144)

## 2020-07-01 LAB — LIPID PANEL
Chol/HDL Ratio: 3.9 ratio (ref 0.0–4.4)
Cholesterol, Total: 213 mg/dL — ABNORMAL HIGH (ref 100–199)
HDL: 55 mg/dL (ref >39)
LDL Chol Calc (NIH): 141 mg/dL — ABNORMAL HIGH (ref 0–99)
Triglycerides: 93 mg/dL (ref 0–149)
VLDL Cholesterol Cal: 17 mg/dL (ref 5–40)

## 2020-07-01 LAB — HEMOGLOBIN A1C
Est. average glucose Bld gHb Est-mCnc: 105 mg/dL
Hgb A1c MFr Bld: 5.3 % (ref 4.8–5.6)

## 2020-07-06 ENCOUNTER — Encounter: Payer: Self-pay | Admitting: Internal Medicine

## 2020-08-11 ENCOUNTER — Other Ambulatory Visit: Payer: Self-pay

## 2020-08-11 ENCOUNTER — Encounter (HOSPITAL_COMMUNITY): Payer: Self-pay | Admitting: Emergency Medicine

## 2020-08-11 ENCOUNTER — Ambulatory Visit (HOSPITAL_COMMUNITY)
Admission: EM | Admit: 2020-08-11 | Discharge: 2020-08-11 | Disposition: A | Discharge status: Home / Self Care | Payer: Medicaid Other | Attending: Emergency Medicine | Admitting: Emergency Medicine

## 2020-08-11 DIAGNOSIS — Z1152 Encounter for screening for COVID-19: Secondary | ICD-10-CM | POA: Insufficient documentation

## 2020-08-11 NOTE — ED Triage Notes (Addendum)
Pt presents for covid test after recent exposure to family member that tested positive. Denies any symptoms at this time.

## 2020-08-11 NOTE — Discharge Instructions (Signed)

## 2020-08-12 LAB — SARS CORONAVIRUS 2 (TAT 6-24 HRS): SARS Coronavirus 2: NEGATIVE

## 2020-10-03 ENCOUNTER — Other Ambulatory Visit: Payer: Self-pay

## 2020-10-03 ENCOUNTER — Ambulatory Visit (HOSPITAL_COMMUNITY)
Admission: EM | Admit: 2020-10-03 | Discharge: 2020-10-03 | Disposition: A | Discharge status: Home / Self Care | Payer: Medicaid Other | Attending: Physician Assistant | Admitting: Physician Assistant

## 2020-10-03 ENCOUNTER — Encounter (HOSPITAL_COMMUNITY): Payer: Self-pay | Admitting: Emergency Medicine

## 2020-10-03 ENCOUNTER — Ambulatory Visit (INDEPENDENT_AMBULATORY_CARE_PROVIDER_SITE_OTHER): Payer: Medicaid Other

## 2020-10-03 DIAGNOSIS — Z20822 Contact with and (suspected) exposure to covid-19: Secondary | ICD-10-CM | POA: Diagnosis not present

## 2020-10-03 DIAGNOSIS — J069 Acute upper respiratory infection, unspecified: Secondary | ICD-10-CM

## 2020-10-03 DIAGNOSIS — L910 Hypertrophic scar: Secondary | ICD-10-CM | POA: Diagnosis not present

## 2020-10-03 DIAGNOSIS — R059 Cough, unspecified: Secondary | ICD-10-CM | POA: Diagnosis present

## 2020-10-03 DIAGNOSIS — Z79899 Other long term (current) drug therapy: Secondary | ICD-10-CM | POA: Diagnosis not present

## 2020-10-03 DIAGNOSIS — M79644 Pain in right finger(s): Secondary | ICD-10-CM | POA: Diagnosis not present

## 2020-10-03 LAB — RESPIRATORY PANEL BY RT PCR (FLU A&B, COVID)
Influenza A by PCR: NEGATIVE
Influenza B by PCR: NEGATIVE
SARS Coronavirus 2 by RT PCR: NEGATIVE

## 2020-10-03 NOTE — ED Triage Notes (Signed)
Pt c/o flu like symptoms including diarrhea, dizziness, bodyaches, headaches, sweating and chills x 4 days. Pt states she feels very fatigued. Pt also states her right index finger has been hurting and feels like something is protruding. Pt also states she has a boil on her groin area that she was given medication for but never took it. She states she only took about two pills.

## 2020-10-03 NOTE — Discharge Instructions (Signed)
Return if any problems.

## 2020-10-03 NOTE — ED Provider Notes (Signed)
[place]    CSN: [id number] Arrival date & time: 10/03/20  1358      History   Chief Complaint Chief Complaint  Patient presents with  . URI  . Finger Injury  . Abscess    HPI Sheila [name] is a 49 y.o. female.   The history is provided by the patient. No language interpreter was used.  URI Presenting symptoms: congestion and cough   Severity:  Moderate Timing:  Constant Progression:  Worsening Chronicity:  New Worsened by:  Nothing Ineffective treatments:  None tried Risk factors: sick contacts   Abscess Pt has a history of a skin infection in suprapubic area and has lumps there now.  Pt forms keloids.  Pt also complains of pain in her finger.  Pt is a Emergency planning/management officer.   Past Medical History:  Diagnosis Date  . Anemia     Patient Active Problem List   Diagnosis Date Noted  . Chest pain at rest 05/30/2014  . Ejection fraction   . Abnormal EKG 04/24/2014  . Chest discomfort 04/23/2014  . Panic attack 04/23/2014  . OBESITY 01/15/2009  . HAIR LOSS 01/15/2009  . ANEMIA-NOS 01/14/2009    Past Surgical History:  Procedure Laterality Date  . LEFT HEART CATHETERIZATION WITH CORONARY ANGIOGRAM N/A 05/30/2014   Procedure: LEFT HEART CATHETERIZATION WITH CORONARY ANGIOGRAM;  Surgeon: Peter M Martinique, MD;  Location: [facility] CATH LAB;  Service: Cardiovascular;  Laterality: N/A;  . TUBAL LIGATION      OB History    Gravida  8   Para  6   Term  4   Preterm  2   AB  2   Living  4     SAB  2   TAB      Ectopic      Multiple      Live Births               Home Medications    Prior to Admission medications   Medication Sig Start Date End Date Taking? Authorizing Provider  ELDERBERRY PO Take 1 tablet by mouth every morning.   Yes [provider]  albuterol (PROVENTIL HFA;VENTOLIN HFA) 108 (90 BASE) MCG/ACT inhaler Inhale 2 puffs into the lungs every 6 (six) hours as needed for wheezing or shortness of breath.    [provider]  aspirin EC 325 MG tablet Take 325 mg by mouth daily as needed (pain).    [provider]  clindamycin (CLEOCIN) 300 MG capsule Take 1 capsule (300 mg total) by mouth 3 (three) times daily. 11/29/19   Raylene [name], MD  fluticasone [facility]) 50 MCG/ACT nasal spray Place 1 spray into both nostrils daily.    [provider]  metoprolol tartrate (LOPRESSOR) 50 MG tablet Take 2 tablets (100 mg total) by mouth once for 1 dose. TAKE TWO HOURS PRIOR TO  SCHEDULE CARDIAC TEST 04/25/20 04/25/20  Elouise [name], MD    Family History Family History  Problem Relation Age of Onset  . Hypertension Mother   . Heart Problems Mother   . Hyperlipidemia Mother   . Diabetes Father   . Seizures Father   . Hypertension Maternal Aunt   . Hypertension Maternal Uncle   . Cancer Maternal Grandmother   . Hypertension Maternal Grandmother   . Stroke Maternal Grandmother   . Stroke Maternal Grandfather     Social History Social History   Tobacco Use  . Smoking status: Never Smoker  .  Smokeless tobacco: Never Used  Substance Use Topics  . Alcohol use: No  . Drug use: No     Allergies   Patient has no known allergies.   Review of Systems Review of Systems  HENT: Positive for congestion.   Respiratory: Positive for cough.   All other systems reviewed and are negative.    Physical Exam Triage Vital Signs ED Triage Vitals  Enc Vitals Group     BP 10/03/20 1453 (!) 138/91     Pulse Rate 10/03/20 1453 73     Resp 10/03/20 1453 16     Temp 10/03/20 1453 98.4 F (36.9 C)     Temp Source 10/03/20 1453 Oral     SpO2 10/03/20 1453 100 %     Weight --      Height --      Head Circumference --      Peak Flow --      Pain Score 10/03/20 1711 2     Pain Loc --      Pain Edu? --      Excl. in [place]? --    No data found.  Updated Vital Signs BP (!) 138/91 (BP Location: Left Arm)   Pulse 73   Temp 98.4 F (36.9 C) (Oral)   Resp 16   SpO2 100%   Visual  Acuity Right Eye Distance:   Left Eye Distance:   Bilateral Distance:    Right Eye Near:   Left Eye Near:    Bilateral Near:     Physical Exam Vitals and nursing note reviewed.  Constitutional:      Appearance: She is well-developed.  HENT:     Head: Normocephalic.  Cardiovascular:     Rate and Rhythm: Normal rate.  Pulmonary:     Effort: Pulmonary effort is normal.  Abdominal:     General: There is no distension.  Genitourinary:    Comments: Keloid scars suprapubic area.  Musculoskeletal:        General: Normal range of motion.     Cervical back: Normal range of motion.     Comments: Tender right indes finger, pain with movement  Neurological:     Mental Status: She is alert and oriented to person, place, and time.  Psychiatric:        Mood and Affect: Mood normal.      UC Treatments / Results  Labs (all labs ordered are listed, but only abnormal results are displayed) Labs Reviewed  RESPIRATORY PANEL BY RT PCR (FLU A&B, COVID)    EKG   Radiology DG Finger Index Right  Result Date: 10/03/2020 CLINICAL DATA:  Right index finger pain for 1 week. Swelling with a knot along the PIP joint. EXAM: RIGHT INDEX FINGER 2+V COMPARISON:  None. FINDINGS: No acute fracture, dislocation, or destructive osseous process is identified. The volar aspect of the base of the proximal phalanx of the index finger is partially obscured on the lateral radiograph by a ring. Joint space widths are preserved. The soft tissues are unremarkable. IMPRESSION: Negative. Electronically Signed   By: Logan Bores M.D.   On: 10/03/2020 16:10    Procedures Procedures (including critical care time)  Medications Ordered in UC Medications - No data to display  Initial Impression / Assessment and Plan / UC Course  I have reviewed the triage vital signs and the nursing notes.  Pertinent labs & imaging results that were available during my care of the patient were reviewed by me and considered  in my  medical decision making (see chart for details).     MDM:  Pt placed in a splint for finger, counseled on keloids,  Covid pending.  Final Clinical Impressions(s) / UC Diagnoses   Final diagnoses:  Finger pain, right  Acute upper respiratory infection  Keloid scar     Discharge Instructions     Return if any problems.     ED Prescriptions    None     PDMP not reviewed this encounter.  An After Visit Summary was printed and given to the patient.    Fransico Meadow, [place] 10/03/20 2051

## 2020-11-05 ENCOUNTER — Emergency Department (HOSPITAL_COMMUNITY): Payer: Medicaid Other

## 2020-11-05 ENCOUNTER — Encounter (HOSPITAL_COMMUNITY): Payer: Self-pay | Admitting: Emergency Medicine

## 2020-11-05 ENCOUNTER — Other Ambulatory Visit: Payer: Self-pay

## 2020-11-05 ENCOUNTER — Emergency Department (HOSPITAL_COMMUNITY)
Admission: EM | Admit: 2020-11-05 | Discharge: 2020-11-06 | Disposition: A | Discharge status: Home / Self Care | Payer: Medicaid Other | Attending: Emergency Medicine | Admitting: Emergency Medicine

## 2020-11-05 DIAGNOSIS — Z5321 Procedure and treatment not carried out due to patient leaving prior to being seen by health care provider: Secondary | ICD-10-CM | POA: Insufficient documentation

## 2020-11-05 DIAGNOSIS — M791 Myalgia, unspecified site: Secondary | ICD-10-CM | POA: Insufficient documentation

## 2020-11-05 DIAGNOSIS — R079 Chest pain, unspecified: Secondary | ICD-10-CM | POA: Insufficient documentation

## 2020-11-05 DIAGNOSIS — R519 Headache, unspecified: Secondary | ICD-10-CM | POA: Diagnosis not present

## 2020-11-05 DIAGNOSIS — R059 Cough, unspecified: Secondary | ICD-10-CM | POA: Insufficient documentation

## 2020-11-05 DIAGNOSIS — Z20822 Contact with and (suspected) exposure to covid-19: Secondary | ICD-10-CM | POA: Diagnosis not present

## 2020-11-05 LAB — CBC
HCT: 36.8 % (ref 36.0–46.0)
Hemoglobin: 11.9 g/dL — ABNORMAL LOW (ref 12.0–15.0)
MCH: 28.8 pg (ref 26.0–34.0)
MCHC: 32.3 g/dL (ref 30.0–36.0)
MCV: 89.1 fL (ref 80.0–100.0)
Platelets: 279 10*3/uL (ref 150–400)
RBC: 4.13 MIL/uL (ref 3.87–5.11)
RDW: 12.6 % (ref 11.5–15.5)
WBC: 4.7 10*3/uL (ref 4.0–10.5)
nRBC: 0 % (ref 0.0–0.2)

## 2020-11-05 LAB — RESP PANEL BY RT-PCR (FLU A&B, COVID) ARPGX2
Influenza A by PCR: NEGATIVE
Influenza B by PCR: NEGATIVE
SARS Coronavirus 2 by RT PCR: NEGATIVE

## 2020-11-05 NOTE — ED Triage Notes (Signed)
Pt c/o chest pain, cough, headache, body aches, and chills. Pt unvaccinated for covid, states she has been around a +covid person.

## 2020-11-06 LAB — BASIC METABOLIC PANEL
Anion gap: 9 (ref 5–15)
BUN: 11 mg/dL (ref 6–20)
CO2: 23 mmol/L (ref 22–32)
Calcium: 9.1 mg/dL (ref 8.9–10.3)
Chloride: 109 mmol/L (ref 98–111)
Creatinine, Ser: 0.87 mg/dL (ref 0.44–1.00)
GFR, Estimated: >60 mL/min (ref >60)
Glucose, Bld: 96 mg/dL (ref 70–99)
Potassium: 3.7 mmol/L (ref 3.5–5.1)
Sodium: 141 mmol/L (ref 135–145)

## 2020-11-06 LAB — TROPONIN I (HIGH SENSITIVITY)
Troponin I (High Sensitivity): 4 ng/L (ref <18)
Troponin I (High Sensitivity): 4 ng/L (ref <18)

## 2020-11-06 NOTE — ED Notes (Signed)
Pt no longer wanted to wait to be seen so pt left at 07:11 am

## 2020-12-09 ENCOUNTER — Other Ambulatory Visit: Payer: Self-pay

## 2020-12-09 ENCOUNTER — Encounter (HOSPITAL_COMMUNITY): Payer: Self-pay

## 2020-12-09 ENCOUNTER — Ambulatory Visit (INDEPENDENT_AMBULATORY_CARE_PROVIDER_SITE_OTHER): Payer: Medicaid Other

## 2020-12-09 ENCOUNTER — Ambulatory Visit (HOSPITAL_COMMUNITY)
Admission: EM | Admit: 2020-12-09 | Discharge: 2020-12-09 | Disposition: A | Discharge status: Home / Self Care | Payer: Medicaid Other | Attending: Family Medicine | Admitting: Family Medicine

## 2020-12-09 DIAGNOSIS — R0602 Shortness of breath: Secondary | ICD-10-CM | POA: Diagnosis present

## 2020-12-09 DIAGNOSIS — R059 Cough, unspecified: Secondary | ICD-10-CM | POA: Diagnosis not present

## 2020-12-09 DIAGNOSIS — J40 Bronchitis, not specified as acute or chronic: Secondary | ICD-10-CM | POA: Diagnosis not present

## 2020-12-09 DIAGNOSIS — Z20822 Contact with and (suspected) exposure to covid-19: Secondary | ICD-10-CM | POA: Insufficient documentation

## 2020-12-09 DIAGNOSIS — Z79899 Other long term (current) drug therapy: Secondary | ICD-10-CM | POA: Insufficient documentation

## 2020-12-09 DIAGNOSIS — J069 Acute upper respiratory infection, unspecified: Secondary | ICD-10-CM | POA: Insufficient documentation

## 2020-12-09 DIAGNOSIS — R052 Subacute cough: Secondary | ICD-10-CM | POA: Diagnosis not present

## 2020-12-09 DIAGNOSIS — Z7982 Long term (current) use of aspirin: Secondary | ICD-10-CM | POA: Insufficient documentation

## 2020-12-09 MED ORDER — PROMETHAZINE-DM 6.25-15 MG/5ML PO SYRP: 5.0000 mL | ORAL_SOLUTION | Freq: Four times a day (QID) | ORAL | 0 refills | Status: DC | PRN

## 2020-12-09 MED ORDER — PREDNISONE 20 MG PO TABS: 40.0000 mg | ORAL_TABLET | Freq: Every day | ORAL | 0 refills | Status: DC

## 2020-12-09 NOTE — ED Triage Notes (Addendum)
Pt c/o productive cough with green sputum, SOB, malaise, soreness to left rib cage/upper left abdomen that increases with movement, coughing, palpation for approx 2 months, worse over the past several weeks. Pt's daughter positive for COVID first week of January. Has had nausea, diarrhea onset last night.   Bilateral lung sounds slightly diminished at bases, otherwise CTA.  Denies fever, vomiting, sore throat. No covid vaccines, no prior covid infection. Took one amoxicillin that pt "had left over" this morning. Used albuterol MDI PTA. Also c/o left ear pain

## 2020-12-10 LAB — SARS CORONAVIRUS 2 (TAT 6-24 HRS): SARS Coronavirus 2: NEGATIVE

## 2020-12-11 NOTE — ED Provider Notes (Signed)
[place]    CSN: [id number] Arrival date & time: 12/09/20  1730      History   Chief Complaint Chief Complaint  Patient presents with  . Shortness of Breath    HPI Sheila [name] is a 50 y.o. female.   Here today with a productive cough that has been ongoing for about 2 months but worse the past few days and now associated with fatigue, body aches, nausea, diarrhea, congestion, sore throat. Some chest tightness and mild SOB but no CP, known fever, vomiting, rashes. Has an albuterol inhaler at home that she has been using some with temporary relief. Also took an amoxil this morning that she had at home leftover. Hx of bronchitis, no known hx of pulmonary dz otherwise. 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      Past Medical History:  Diagnosis Date  . Anemia     Patient Active Problem List   Diagnosis Date Noted  . Chest pain at rest 05/30/2014  . Ejection fraction   . Abnormal EKG 04/24/2014  . Chest discomfort 04/23/2014  . Panic attack 04/23/2014  . OBESITY 01/15/2009  . HAIR LOSS 01/15/2009  . ANEMIA-NOS 01/14/2009    Past Surgical History:  Procedure Laterality Date  . LEFT HEART CATHETERIZATION WITH CORONARY ANGIOGRAM N/A 05/30/2014   Procedure: LEFT HEART CATHETERIZATION WITH CORONARY ANGIOGRAM;  Surgeon: Peter M Martinique, MD;  Location: [facility] CATH LAB;  Service: Cardiovascular;  Laterality: N/A;  . TUBAL LIGATION      OB History    Gravida  8   Para  6   Term  4   Preterm  2   AB  2   Living  4     SAB  2   IAB      Ectopic      Multiple      Live Births               Home Medications    Prior to Admission medications   Medication Sig Start Date End Date Taking? Authorizing Provider  albuterol (PROVENTIL HFA;VENTOLIN HFA) 108 (90 BASE) MCG/ACT inhaler Inhale 2 puffs into the lungs every 6 (six) hours as needed for wheezing or shortness of  breath.   Yes [provider]  fluticasone (FLONASE) 50 MCG/ACT nasal spray Place 1 spray into both nostrils daily.   Yes [provider]  predniSONE (DELTASONE) 20 MG tablet Take 2 tablets (40 mg total) by mouth daily with breakfast. 12/09/20  Yes Volney [name], [name]  promethazine-dextromethorphan (PROMETHAZINE-DM) 6.25-15 MG/5ML syrup Take 5 mLs by mouth 4 (four) times daily as needed for cough. 12/09/20  Yes Volney [name], [name]  aspirin EC 325 MG tablet Take 325 mg by mouth daily as needed (pain).    [provider]  clindamycin (CLEOCIN) 300 MG capsule Take 1 capsule (300 mg total) by mouth 3 (three) times daily. 11/29/19   Raylene [name], MD  ELDERBERRY PO Take 1 tablet by mouth every morning.    [provider]  metoprolol tartrate (LOPRESSOR) 50 MG tablet Take 2 tablets (100 mg total) by mouth once for 1 dose. TAKE TWO HOURS PRIOR TO  SCHEDULE CARDIAC TEST 04/25/20 04/25/20  Elouise [name], MD    Family History Family History  Problem Relation Age of Onset  . Hypertension Mother   . Heart Problems Mother   . Hyperlipidemia Mother   .  Diabetes Father   . Seizures Father   . Hypertension Maternal Aunt   . Hypertension Maternal Uncle   . Cancer Maternal Grandmother   . Hypertension Maternal Grandmother   . Stroke Maternal Grandmother   . Stroke Maternal Grandfather     Social History Social History   Tobacco Use  . Smoking status: Never Smoker  . Smokeless tobacco: Never Used  Substance Use Topics  . Alcohol use: No  . Drug use: No     Allergies   Patient has no known allergies.   Review of Systems Review of Systems PER HPI    Physical Exam Triage Vital Signs ED Triage Vitals  Enc Vitals Group     BP 12/09/20 1902 128/76     Pulse Rate 12/09/20 1902 91     Resp 12/09/20 1902 20     Temp 12/09/20 1902 99.1 F (37.3 C)     Temp Source 12/09/20 1902 Oral     SpO2 12/09/20 1902 100 %     Weight  12/09/20 1900 182 lb (82.6 kg)     Height 12/09/20 1900 5\' 9"  (1.753 m)     Head Circumference --      Peak Flow --      Pain Score 12/09/20 1859 7     Pain Loc --      Pain Edu? --      Excl. in [place]? --    No data found.  Updated Vital Signs BP 128/76 (BP Location: Left Arm)   Pulse 91   Temp 99.1 F (37.3 C) (Oral)   Resp 20   Ht 5\' 9"  (1.753 m)   Wt 182 lb (82.6 kg)   SpO2 100%   BMI 26.88 kg/m   Visual Acuity Right Eye Distance:   Left Eye Distance:   Bilateral Distance:    Right Eye Near:   Left Eye Near:    Bilateral Near:     Physical Exam Vitals and nursing note reviewed.  Constitutional:      Appearance: Normal appearance. She is not ill-appearing.  HENT:     Head: Atraumatic.     Right Ear: Tympanic membrane normal.     Left Ear: Tympanic membrane normal.     Nose: Rhinorrhea present.     Mouth/Throat:     Mouth: Mucous membranes are moist.     Pharynx: Oropharynx is clear. Posterior oropharyngeal erythema present.  Eyes:     Extraocular Movements: Extraocular movements intact.     Conjunctiva/sclera: Conjunctivae normal.  Cardiovascular:     Rate and Rhythm: Normal rate and regular rhythm.     Heart sounds: Normal heart sounds.  Pulmonary:     Effort: Pulmonary effort is normal. No respiratory distress.     Breath sounds: Normal breath sounds. No wheezing or rales.  Abdominal:     General: Bowel sounds are normal. There is no distension.     Palpations: Abdomen is soft.     Tenderness: There is no abdominal tenderness. There is no guarding.  Musculoskeletal:        General: Normal range of motion.     Cervical back: Normal range of motion and neck supple.  Skin:    General: Skin is warm and dry.  Neurological:     Mental Status: She is alert and oriented to person, place, and time.  Psychiatric:        Mood and Affect: Mood normal.        Thought Content: Thought  content normal.        Judgment: Judgment normal.      UC Treatments /  Results  Labs (all labs ordered are listed, but only abnormal results are displayed) Labs Reviewed  SARS CORONAVIRUS 2 (TAT 6-24 HRS)    EKG   Radiology DG Chest 2 View  Result Date: 12/09/2020 CLINICAL DATA:  50 year old female with cough. EXAM: CHEST - 2 VIEW COMPARISON:  Chest radiograph dated 11/05/2020. FINDINGS: The heart size and mediastinal contours are within normal limits. Both lungs are clear. The visualized skeletal structures are unremarkable. IMPRESSION: No active cardiopulmonary disease. Electronically Signed   By: Anner Crete M.D.   On: 12/09/2020 19:41    Procedures Procedures (including critical care time)  Medications Ordered in UC Medications - No data to display  Initial Impression / Assessment and Plan / UC Course  I have reviewed the triage vital signs and the nursing notes.  Pertinent labs & imaging results that were available during my care of the patient were reviewed by me and considered in my medical decision making (see chart for details).     Exam and vitals reassuring today, CXR without acute abnormality. COVID pcr pending, and will treat for bronchitis given duration of her cough and chest sxs while awaiting results. Prednisone, phenergan DM, albuterol, fluids rest. Work note given, isolate. Return for acutely worsening sxs.   Final Clinical Impressions(s) / UC Diagnoses   Final diagnoses:  Viral URI with cough  Bronchitis   Discharge Instructions   None    ED Prescriptions    Medication Sig Dispense Auth. Provider   predniSONE (DELTASONE) 20 MG tablet Take 2 tablets (40 mg total) by mouth daily with breakfast. 10 tablet Volney [name], [name]   promethazine-dextromethorphan (PROMETHAZINE-DM) 6.25-15 MG/5ML syrup Take 5 mLs by mouth 4 (four) times daily as needed for cough. 100 mL Volney [name], [place]     PDMP not reviewed this encounter.   Volney [name], [place] 12/11/20 1048

## 2021-02-25 ENCOUNTER — Ambulatory Visit (INDEPENDENT_AMBULATORY_CARE_PROVIDER_SITE_OTHER): Payer: Medicaid Other | Admitting: Obstetrics and Gynecology

## 2021-02-25 ENCOUNTER — Other Ambulatory Visit: Payer: Self-pay

## 2021-02-25 ENCOUNTER — Other Ambulatory Visit (HOSPITAL_COMMUNITY)
Admission: RE | Admit: 2021-02-25 | Discharge: 2021-02-25 | Disposition: A | Discharge status: Home / Self Care | Payer: Medicaid Other | Source: Ambulatory Visit | Attending: Obstetrics and Gynecology | Admitting: Obstetrics and Gynecology

## 2021-02-25 ENCOUNTER — Encounter: Payer: Self-pay | Admitting: Obstetrics and Gynecology

## 2021-02-25 DIAGNOSIS — Z01419 Encounter for gynecological examination (general) (routine) without abnormal findings: Secondary | ICD-10-CM | POA: Diagnosis not present

## 2021-02-25 DIAGNOSIS — L989 Disorder of the skin and subcutaneous tissue, unspecified: Secondary | ICD-10-CM | POA: Insufficient documentation

## 2021-02-25 DIAGNOSIS — Z113 Encounter for screening for infections with a predominantly sexual mode of transmission: Secondary | ICD-10-CM | POA: Insufficient documentation

## 2021-02-25 NOTE — Progress Notes (Signed)
Pt is in the office for annual exam.  Last pap 2016 Last mammogram 2015 Postmenopausal Hx of tubal Pt desires std screening

## 2021-02-25 NOTE — Patient Instructions (Signed)
Call if there is any further vaginal bleeding, you may need other dignostic procedures or an ultrasound for evaluation

## 2021-02-25 NOTE — Progress Notes (Signed)
GYNECOLOGY ANNUAL PREVENTATIVE CARE ENCOUNTER NOTE  History:     Sheila [name] is a 50 y.o. [phone] female here for a routine annual gynecologic exam.  Current complaints: occasional vasomotor symptoms.   Denies abnormal vaginal bleeding, discharge, pelvic pain, problems with intercourse or other gynecologic concerns. Pt noted no menses x 2 years.  Had one incident of spotting when she wiped about 9 days ago.  No bleeding since.   Gynecologic History Patient's last menstrual period was 04/03/2018 (within days). Contraception: post menopausal status Last Pap: 05/30/2018. Results were: normal with negative HPV Last mammogram: 11/07/2014. Results were: normal  Obstetric History OB History  Gravida Para Term Preterm AB Living  8 6 4 2 2 4   SAB IAB Ectopic Multiple Live Births  2            # Outcome Date GA Lbr Len/2nd Weight Sex Delivery Anes PTL Lv  8 Term           7 Term           6 Term           5 Term           4 Preterm           3 Preterm           2 SAB           1 SAB             Past Medical History:  Diagnosis Date  . Anemia     Past Surgical History:  Procedure Laterality Date  . LEFT HEART CATHETERIZATION WITH CORONARY ANGIOGRAM N/A 05/30/2014   Procedure: LEFT HEART CATHETERIZATION WITH CORONARY ANGIOGRAM;  Surgeon: Peter M Martinique, MD;  Location: [facility] CATH LAB;  Service: Cardiovascular;  Laterality: N/A;  . TUBAL LIGATION      Current Outpatient Medications on File Prior to Visit  Medication Sig Dispense Refill  . albuterol (PROVENTIL HFA;VENTOLIN HFA) 108 (90 BASE) MCG/ACT inhaler Inhale 2 puffs into the lungs every 6 (six) hours as needed for wheezing or shortness of breath. (Patient not taking: Reported on 02/25/2021)    . aspirin EC 325 MG tablet Take 325 mg by mouth daily as needed (pain). (Patient not taking: Reported on 02/25/2021)    . clindamycin (CLEOCIN) 300 MG capsule Take 1 capsule (300 mg total) by mouth 3 (three) times daily. (Patient not  taking: Reported on 02/25/2021) 21 capsule 0  . ELDERBERRY PO Take 1 tablet by mouth every morning. (Patient not taking: Reported on 02/25/2021)    . fluticasone (FLONASE) 50 MCG/ACT nasal spray Place 1 spray into both nostrils daily. (Patient not taking: Reported on 02/25/2021)    . metoprolol tartrate (LOPRESSOR) 50 MG tablet Take 2 tablets (100 mg total) by mouth once for 1 dose. TAKE TWO HOURS PRIOR TO  SCHEDULE CARDIAC TEST 2 tablet 0  . predniSONE (DELTASONE) 20 MG tablet Take 2 tablets (40 mg total) by mouth daily with breakfast. (Patient not taking: Reported on 02/25/2021) 10 tablet 0  . promethazine-dextromethorphan (PROMETHAZINE-DM) 6.25-15 MG/5ML syrup Take 5 mLs by mouth 4 (four) times daily as needed for cough. (Patient not taking: Reported on 02/25/2021) 100 mL 0   No current facility-administered medications on file prior to visit.    No Known Allergies  Social History:  reports that she has never smoked. She has never used smokeless tobacco. She reports that she does not drink alcohol and does not use  drugs.  Family History  Problem Relation Age of Onset  . Hypertension Mother   . Heart Problems Mother   . Hyperlipidemia Mother   . Breast cancer Mother   . Diabetes Father   . Seizures Father   . Hypertension Maternal Aunt   . Hypertension Maternal Uncle   . Cancer Maternal Grandmother   . Hypertension Maternal Grandmother   . Stroke Maternal Grandmother   . Stroke Maternal Grandfather     The following portions of the patient's history were reviewed and updated as appropriate: allergies, current medications, past family history, past medical history, past social history, past surgical history and problem list.  Review of Systems Pertinent items noted in HPI and remainder of comprehensive ROS otherwise negative.  Physical Exam:  BP 119/78   Pulse 77   Ht 5' 9.5" (1.765 m)   Wt 215 lb 6.4 oz (97.7 kg)   LMP 04/03/2018 (Within Days)   BMI 31.35 kg/m  CONSTITUTIONAL:  Well-developed, well-nourished female in no acute distress.  HENT:  Normocephalic, atraumatic, External right and left ear normal. Oropharynx is clear and moist EYES: Conjunctivae and EOM are normal. Pupils are equal, round, and reactive to light. No scleral icterus.  NECK: Normal range of motion, supple, no masses.  Normal thyroid.  SKIN: Skin is warm and dry.  Not diaphoretic. No erythema. No pallor.  Multiple keloids noted throughout the body.  Hypopigmentation of the face with hyperpigmentation of bilateral shins with hair follicle involvement MUSCULOSKELETAL: Normal range of motion. No tenderness.  No cyanosis, clubbing, or edema.  2+ distal pulses. NEUROLOGIC: Alert and oriented to person, place, and time. Normal reflexes, muscle tone coordination.  PSYCHIATRIC: Normal mood and affect. Normal behavior. Normal judgment and thought content. CARDIOVASCULAR: Normal heart rate noted, regular rhythm RESPIRATORY: Clear to auscultation bilaterally. Effort and breath sounds normal, no problems with respiration noted. BREASTS: Symmetric in size. Questionable soft mass in left breast at 2 o clock, tenderness noted in the same area.  Performed in the presence of a chaperone. ABDOMEN: Soft, no distention noted.  No tenderness, rebound or guarding.  PELVIC: Normal appearing external genitalia and urethral meatus; normal appearing vaginal mucosa and cervix.  No abnormal discharge noted.  Pap smear obtained.  Normal uterine size, no other palpable masses, no uterine or adnexal tenderness.  Performed in the presence of a chaperone.   Assessment and Plan:    1. Women's annual routine gynecological examination Will refer to GI for colonoscopy and for screening mammogram If there is any recurrence of postmenopausal spotting will either get endometrial biopsy or pelvic u/s for evaluation - Cytology - PAP( [place]) - Ambulatory referral to Gastroenterology - MM DIAG BREAST TOMO BILATERAL; Future  2.  Screening examination for STD (sexually transmitted disease) At pt request, STD screening done - Cervicovaginal ancillary only( [place]) - HIV antibody (with reflex) - RPR - Hepatitis C Antibody - Hepatitis B Surface AntiGEN  3. Skin lesion Refer to dermatology due to hypopigmented splotches on the face and hyperpigmentation on bilateral shins. - Ambulatory referral to Dermatology  Will follow up results of pap smear and manage accordingly. Mammogram scheduled Routine preventative health maintenance measures emphasized. Please refer to After Visit Summary for other counseling recommendations.      Lynnda Shields, MD, [place] for [facility], [place]

## 2021-02-26 LAB — HEPATITIS C ANTIBODY: Hep C Virus Ab: <0.1 s/co ratio (ref 0.0–0.9)

## 2021-02-26 LAB — CERVICOVAGINAL ANCILLARY ONLY
Bacterial Vaginitis (gardnerella): NEGATIVE
Candida Glabrata: NEGATIVE
Candida Vaginitis: NEGATIVE
Chlamydia: NEGATIVE
Comment: NEGATIVE
Comment: NEGATIVE
Comment: NEGATIVE
Comment: NEGATIVE
Comment: NEGATIVE
Comment: NORMAL
Neisseria Gonorrhea: NEGATIVE
Trichomonas: NEGATIVE

## 2021-02-26 LAB — HEPATITIS B SURFACE ANTIGEN: Hepatitis B Surface Ag: NEGATIVE

## 2021-02-26 LAB — RPR: RPR Ser Ql: NONREACTIVE

## 2021-02-26 LAB — HIV ANTIBODY (ROUTINE TESTING W REFLEX): HIV Screen 4th Generation wRfx: NONREACTIVE

## 2021-02-27 LAB — CYTOLOGY - PAP
Comment: NEGATIVE
Diagnosis: NEGATIVE
High risk HPV: NEGATIVE

## 2021-03-17 ENCOUNTER — Other Ambulatory Visit: Payer: Self-pay

## 2021-03-17 ENCOUNTER — Ambulatory Visit
Admission: RE | Admit: 2021-03-17 | Discharge: 2021-03-17 | Disposition: A | Discharge status: Home / Self Care | Payer: Medicaid Other | Source: Ambulatory Visit | Attending: Obstetrics and Gynecology | Admitting: Obstetrics and Gynecology

## 2021-03-17 DIAGNOSIS — Z01419 Encounter for gynecological examination (general) (routine) without abnormal findings: Secondary | ICD-10-CM

## 2021-03-19 ENCOUNTER — Other Ambulatory Visit: Payer: Self-pay | Admitting: Obstetrics and Gynecology

## 2021-03-19 DIAGNOSIS — R928 Other abnormal and inconclusive findings on diagnostic imaging of breast: Secondary | ICD-10-CM

## 2021-03-19 DIAGNOSIS — E079 Disorder of thyroid, unspecified: Secondary | ICD-10-CM

## 2021-03-31 ENCOUNTER — Other Ambulatory Visit: Payer: Self-pay | Admitting: Obstetrics and Gynecology

## 2021-03-31 ENCOUNTER — Other Ambulatory Visit: Payer: Self-pay

## 2021-03-31 ENCOUNTER — Ambulatory Visit
Admission: RE | Admit: 2021-03-31 | Discharge: 2021-03-31 | Disposition: A | Discharge status: Home / Self Care | Payer: Medicaid Other | Source: Ambulatory Visit | Attending: Obstetrics and Gynecology | Admitting: Obstetrics and Gynecology

## 2021-03-31 DIAGNOSIS — R928 Other abnormal and inconclusive findings on diagnostic imaging of breast: Secondary | ICD-10-CM

## 2021-04-03 ENCOUNTER — Ambulatory Visit
Admission: RE | Admit: 2021-04-03 | Discharge: 2021-04-03 | Disposition: A | Discharge status: Home / Self Care | Payer: Medicaid Other | Source: Ambulatory Visit | Attending: Obstetrics and Gynecology | Admitting: Obstetrics and Gynecology

## 2021-04-03 ENCOUNTER — Other Ambulatory Visit: Payer: Self-pay

## 2021-04-03 DIAGNOSIS — R928 Other abnormal and inconclusive findings on diagnostic imaging of breast: Secondary | ICD-10-CM

## 2021-04-05 ENCOUNTER — Emergency Department (HOSPITAL_COMMUNITY)
Admission: EM | Admit: 2021-04-05 | Discharge: 2021-04-05 | Disposition: A | Discharge status: Home / Self Care | Payer: Medicaid Other | Attending: Emergency Medicine | Admitting: Emergency Medicine

## 2021-04-05 ENCOUNTER — Other Ambulatory Visit: Payer: Self-pay

## 2021-04-05 ENCOUNTER — Encounter (HOSPITAL_COMMUNITY): Payer: Self-pay | Admitting: Emergency Medicine

## 2021-04-05 ENCOUNTER — Emergency Department (HOSPITAL_COMMUNITY): Payer: Medicaid Other

## 2021-04-05 DIAGNOSIS — M533 Sacrococcygeal disorders, not elsewhere classified: Secondary | ICD-10-CM | POA: Insufficient documentation

## 2021-04-05 MED ORDER — OXYCODONE-ACETAMINOPHEN 5-325 MG PO TABS
1.0000 | ORAL_TABLET | Freq: Once | ORAL | Status: AC
  Administered 2021-04-05: 1 via ORAL
  Filled 2021-04-05: qty 1

## 2021-04-05 MED ORDER — KETOROLAC TROMETHAMINE 10 MG PO TABS: 10.0000 mg | ORAL_TABLET | Freq: Three times a day (TID) | ORAL | 0 refills | Status: AC | PRN

## 2021-04-05 NOTE — ED Provider Notes (Signed)
Sheila Sheila [name] Provider Note   CSN: [id number] Arrival date & time: 04/05/21  1324     History Chief Complaint  Patient presents with  . Sheila Sheila [name]    Sheila Sheila [name] is a 50 y.o. female.  HPI  Patient presents with Sheila [name] in her Sheila and buttocks.  The Sheila [name] has been present for over a week but has significantly worsened over the past week and even worse over the past few days.  Feels like a severe ache.  She traces it running from her Sheila down the middle of her buttocks and she says it radiates into her anus.  She does not have any Sheila [name] with defecation or difficulty with defecation.  Her Sheila [name] is worse with sitting and sometimes with walking.  Her Sheila [name] does not get worse if she walks for long distance compared to walking at all.  No falls or injury.  History of hemorrhoids but has not had significant problems with hemorrhoids.  No numbness or tingling or weakness of her legs.  No perineal numbness.     Past Medical History:  Diagnosis Date  . Anemia     Patient Active Problem List   Diagnosis Date Noted  . Women's annual routine gynecological examination 02/25/2021  . Screening examination for STD (sexually transmitted disease) 02/25/2021  . Skin lesion 02/25/2021  . Chest Sheila [name] at rest 05/30/2014  . Ejection fraction   . Abnormal EKG 04/24/2014  . Chest discomfort 04/23/2014  . Panic attack 04/23/2014  . OBESITY 01/15/2009  . HAIR LOSS 01/15/2009  . ANEMIA-NOS 01/14/2009    Past Surgical History:  Procedure Laterality Date  . LEFT HEART CATHETERIZATION WITH CORONARY ANGIOGRAM N/A 05/30/2014   Procedure: LEFT HEART CATHETERIZATION WITH CORONARY ANGIOGRAM;  Surgeon: Peter M Martinique, MD;  Location: [facility] CATH LAB;  Service: Cardiovascular;  Laterality: N/A;  . TUBAL LIGATION       OB History    Gravida  8   Para  6   Term  4   Preterm  2   AB  2   Living  4     SAB  2   IAB      Ectopic      Multiple       Live Births              Family History  Problem Relation Age of Onset  . Hypertension Mother   . Heart Problems Mother   . Hyperlipidemia Mother   . Breast cancer Mother   . Diabetes Father   . Seizures Father   . Hypertension Maternal Aunt   . Hypertension Maternal Uncle   . Cancer Maternal Grandmother   . Hypertension Maternal Grandmother   . Stroke Maternal Grandmother   . Stroke Maternal Grandfather     Social History   Tobacco Use  . Smoking status: Never Smoker  . Smokeless tobacco: Never Used  Substance Use Topics  . Alcohol use: No  . Drug use: No    Home Medications Prior to Admission medications   Medication Sig Start Date End Date Taking? Authorizing Provider  ketorolac (TORADOL) 10 MG tablet Take 1 tablet (10 mg total) by mouth every 8 (eight) hours as needed for up to 5 days for moderate Sheila [name] or severe Sheila [name]. 04/05/21 04/10/21 Yes Aris Lot, MD  albuterol (PROVENTIL HFA;VENTOLIN HFA) 108 (90 BASE) MCG/ACT inhaler Inhale 2 puffs into the lungs every 6 (six) hours as needed for wheezing or shortness of  breath. Patient not taking: Reported on 02/25/2021    [provider]  aspirin EC 325 MG tablet Take 325 mg by mouth daily as needed (Sheila [name]). Patient not taking: Reported on 02/25/2021    [provider]  clindamycin (CLEOCIN) 300 MG capsule Take 1 capsule (300 mg total) by mouth 3 (three) times daily. Patient not taking: Reported on 02/25/2021 11/29/19   Raylene [name], MD  ELDERBERRY PO Take 1 tablet by mouth every morning. Patient not taking: Reported on 02/25/2021    [provider]  fluticasone (FLONASE) 50 MCG/ACT nasal spray Place 1 spray into both nostrils daily. Patient not taking: Reported on 02/25/2021    [provider]  metoprolol tartrate (LOPRESSOR) 50 MG tablet Take 2 tablets (100 mg total) by mouth once for 1 dose. TAKE TWO HOURS PRIOR TO  SCHEDULE CARDIAC TEST 04/25/20 04/25/20  Elouise [name], MD   predniSONE (DELTASONE) 20 MG tablet Take 2 tablets (40 mg total) by mouth daily with breakfast. Patient not taking: Reported on 02/25/2021 12/09/20   Volney [name], [name]  promethazine-dextromethorphan (PROMETHAZINE-DM) 6.25-15 MG/5ML syrup Take 5 mLs by mouth 4 (four) times daily as needed for cough. Patient not taking: Reported on 02/25/2021 12/09/20   Volney [name], [name]    Allergies    Patient has no known allergies.  Review of Systems   Review of Systems  Constitutional: Negative for chills and fever.  HENT: Negative for ear Sheila [name] and sore throat.   Eyes: Negative for Sheila [name] and visual disturbance.  Respiratory: Negative for cough and shortness of breath.   Cardiovascular: Negative for chest Sheila [name] and palpitations.  Gastrointestinal: Negative for abdominal Sheila [name] and vomiting.  Genitourinary: Negative for dysuria and hematuria.  Musculoskeletal: Negative for arthralgias and back Sheila [name].  Skin: Negative for color change and rash.  Neurological: Negative for seizures and syncope.  All other systems reviewed and are negative.   Physical Exam Updated Vital Signs BP (!) 145/83   Pulse 76   Temp 98.9 F (37.2 C)   Resp 16   LMP 04/03/2018 (Within Days)   SpO2 100%   Physical Exam Vitals and nursing note reviewed.  Constitutional:      General: She is not in acute distress.    Appearance: She is well-developed.  HENT:     Head: Normocephalic and atraumatic.  Eyes:     Extraocular Movements: Extraocular movements intact.     Conjunctiva/sclera: Conjunctivae normal.  Cardiovascular:     Rate and Rhythm: Normal rate and regular rhythm.     Heart sounds: No murmur heard.   Pulmonary:     Effort: Pulmonary effort is normal. No respiratory distress.     Breath sounds: Normal breath sounds.  Abdominal:     Palpations: Abdomen is soft.     Tenderness: There is no abdominal tenderness.  Musculoskeletal:     Cervical back: Neck supple.     Comments: Coccyx ttp,  buttocks ttp bl, ttp in midline of buttocks, hemorrhoids present without thrombosis.    Skin:    General: Skin is warm and dry.  Neurological:     Mental Status: She is alert.     Motor: No weakness.     Comments: No perineal numbness. SLR negative bilaterally. 5/5 bilateral lower extremity strength. Light touch sensation intact and symmetric.  Psychiatric:        Mood and Affect: Mood normal.        Behavior: Behavior normal.     ED Results /  Procedures / Treatments   Labs (all labs ordered are listed, but only abnormal results are displayed) Labs Reviewed - No data to display  EKG None  Radiology CT ABDOMEN PELVIS WO CONTRAST  Result Date: 04/05/2021 CLINICAL DATA:  Lower abdominal Sheila [name] spreading to the buttocks. EXAM: CT ABDOMEN AND PELVIS WITHOUT CONTRAST TECHNIQUE: Multidetector CT imaging of the abdomen and pelvis was performed following the standard protocol without IV contrast. COMPARISON:  04/29/2018 FINDINGS: LOWER CHEST: Normal. HEPATOBILIARY: Unchanged appearance of inferior right hepatic lobe lesion containing numerous calcifications. No biliary dilatation. No ascites. Gallbladder is normal. PANCREAS: Normal pancreas. No ductal dilatation or peripancreatic fluid collection. SPLEEN: Normal. ADRENALS/URINARY TRACT: The adrenal glands are normal. No hydronephrosis, nephroureterolithiasis or solid renal mass. The urinary bladder is normal for degree of distention STOMACH/BOWEL: There is no hiatal hernia. Normal duodenal course and caliber. No small bowel dilatation or inflammation. No focal colonic abnormality. Normal appendix. VASCULAR/LYMPHATIC: Normal course and caliber of the major abdominal vessels. No abdominal or pelvic lymphadenopathy. REPRODUCTIVE: Normal uterus. No adnexal mass. MUSCULOSKELETAL. No bony spinal canal stenosis or focal osseous abnormality. OTHER: None. IMPRESSION: 1. No acute abnormality of the abdomen or pelvis. 2. Unchanged appearance of inferior right  hepatic lobe lesion containing numerous calcifications. Electronically Signed   By: Ulyses Jarred M.D.   On: 04/05/2021 20:01    Procedures Procedures   Medications Ordered in ED Medications  oxyCODONE-acetaminophen (PERCOCET/ROXICET) 5-325 MG per tablet 1 tablet (1 tablet Oral Given 04/05/21 1941)    ED Course  I have reviewed the triage vital signs and the nursing notes.  Pertinent labs & imaging results that were available during my care of the patient were reviewed by me and considered in my medical decision making (see chart for details).    MDM Rules/Calculators/A&P                          Patient presents with sacral Sheila [name].  Considered sacral nerve plexus Sheila [name] or pudendal Sheila [name] given that it radiates into her anus.  She does not have Sheila [name] with defecation or signs or symptoms of anal fissure or thrombosed hemorrhoid or rectal problem.  Her stools are normal, not difficult or painful.  No focal neurologic deficit and no signs of cord compression or spinal cord emergency.  She was recently diagnosed with breast cancer and other process such as mass, tumor, bone metastases were considered and we discussed CT scan for further evaluation versus outpatient follow-up given that the test would be beneficial and potentially diagnostic but she could also reasonably follow-up.  She opted for the CT scan.  It was obtained and does not show any acute emergent abnormality.  Plan is to manage her Sheila [name] in the outpatient setting with NSAIDs and primary care follow-up.  Patient understands and agrees.  Final Clinical Impression(s) / ED Diagnoses Final diagnoses:  Coccyx Sheila [name]    Rx / DC Orders ED Discharge Orders         Ordered    ketorolac (TORADOL) 10 MG tablet  Every 8 hours PRN        04/05/21 2033           Aris Lot, MD 04/05/21 2125    Sherwood Gambler, MD 04/06/21 [phone]

## 2021-04-05 NOTE — Discharge Instructions (Signed)
Take the Toradol as needed for pain.  Follow-up with your doctor regarding your symptoms today.  Please come back for leg weakness, inability to walk, or other emergencies.

## 2021-04-05 NOTE — ED Provider Notes (Signed)
Emergency Medicine Provider Triage Evaluation Note  Sheila [name] 50 y.o. female was evaluated in triage.  Pt complains of rectal pain that has been ongoing for a week.  She states initially her rectum was hurting.  Now she feels like it is spreading to her gluteus and is making the whole area ache.  She is still been having bowel movements.  No fevers.  No redness or swelling.  History of hemorrhoids   Review of Systems  Positive: Rectal pain  Negative: Fevers, redness or swelling   Physical Exam  BP 134/82   Pulse 70   Temp 98.2 F (36.8 C) (Oral)   Resp 18   Ht 5\' 4"  (1.626 m)   Wt 65.8 kg   SpO2 100%   BMI 24.89 kg/m  Gen:   Awake, no distress   HEENT:  Atraumatic  Resp:  Normal effort  Cardiac:  Normal rate  Abd:   Nondistended, nontender  MSK:   Moves extremities without difficulty  Neuro:  Speech clear   Other:   No area of warmth, erythema, edema noted in the gluteal area bilaterally. The exam was performed with a chaperone present Thurmond Butts, RN)  Medical Decision Making  Medically screening exam initiated at 1:43 PM  Appropriate orders placed.  Sheila [name] was informed that the remainder of the evaluation will be completed by another provider, this initial triage assessment does not replace that evaluation. They are counseled that they will need to remain in the ED until the completion of their workup, including full H&P and results of any tests.  Risks of leaving the emergency department prior to completion of treatment were discussed. Patient was advised to inform ED staff if they are leaving before their treatment is complete. The patient acknowledged these risks and time was allowed for questions.     The patient appears stable so that the remainder of the MSE may be completed by another provider.   Clinical Impression  Rectal pain    Portions of this note were generated with Dragon dictation software. Dictation errors may occur despite best attempts at  proofreading.      Volanda Napoleon, [name] 04/05/21 1344    Isla Pence, MD 04/05/21 1358

## 2021-04-05 NOTE — ED Triage Notes (Signed)
Patient coming from home, complaint of tailbone pain approx a week.

## 2021-04-07 ENCOUNTER — Encounter: Payer: Self-pay | Admitting: *Deleted

## 2021-04-07 ENCOUNTER — Telehealth: Payer: Self-pay | Admitting: *Deleted

## 2021-04-07 NOTE — Telephone Encounter (Signed)
Confirmed [facility] for 04/15/21 at 0815 .  Instructions and contact information given.

## 2021-04-08 ENCOUNTER — Encounter: Payer: Self-pay | Admitting: *Deleted

## 2021-04-08 ENCOUNTER — Other Ambulatory Visit: Payer: Medicaid Other

## 2021-04-08 DIAGNOSIS — C50412 Malignant neoplasm of upper-outer quadrant of left female breast: Secondary | ICD-10-CM

## 2021-04-08 DIAGNOSIS — Z170 Estrogen receptor positive status [ER+]: Secondary | ICD-10-CM | POA: Insufficient documentation

## 2021-04-08 HISTORY — DX: Malignant neoplasm of upper-outer quadrant of left female breast: Z17.0

## 2021-04-08 HISTORY — DX: Malignant neoplasm of upper-outer quadrant of left female breast: C50.412

## 2021-04-15 ENCOUNTER — Encounter: Payer: Self-pay | Admitting: Physical Therapy

## 2021-04-15 ENCOUNTER — Other Ambulatory Visit: Payer: Self-pay | Admitting: General Surgery

## 2021-04-15 ENCOUNTER — Ambulatory Visit (HOSPITAL_BASED_OUTPATIENT_CLINIC_OR_DEPARTMENT_OTHER): Payer: Medicaid Other | Admitting: Genetic Counselor

## 2021-04-15 ENCOUNTER — Encounter: Payer: Self-pay | Admitting: *Deleted

## 2021-04-15 ENCOUNTER — Encounter: Payer: Self-pay | Admitting: Licensed Clinical Social Worker

## 2021-04-15 ENCOUNTER — Inpatient Hospital Stay: Payer: Medicaid Other

## 2021-04-15 ENCOUNTER — Ambulatory Visit
Admission: RE | Admit: 2021-04-15 | Discharge: 2021-04-15 | Disposition: A | Discharge status: Home / Self Care | Payer: Medicaid Other | Source: Ambulatory Visit | Attending: Radiation Oncology | Admitting: Radiation Oncology

## 2021-04-15 ENCOUNTER — Encounter: Payer: Self-pay | Admitting: Hematology

## 2021-04-15 ENCOUNTER — Inpatient Hospital Stay (HOSPITAL_BASED_OUTPATIENT_CLINIC_OR_DEPARTMENT_OTHER): Payer: Medicaid Other | Admitting: Hematology

## 2021-04-15 ENCOUNTER — Other Ambulatory Visit: Payer: Self-pay

## 2021-04-15 ENCOUNTER — Ambulatory Visit: Payer: Medicaid Other | Attending: General Surgery | Admitting: Physical Therapy

## 2021-04-15 VITALS — BP 127/84 | HR 82 | Temp 97.3°F | Resp 20 | Ht 69.5 in | Wt 211.6 lb

## 2021-04-15 DIAGNOSIS — C50412 Malignant neoplasm of upper-outer quadrant of left female breast: Secondary | ICD-10-CM | POA: Insufficient documentation

## 2021-04-15 DIAGNOSIS — Z803 Family history of malignant neoplasm of breast: Secondary | ICD-10-CM

## 2021-04-15 DIAGNOSIS — Z170 Estrogen receptor positive status [ER+]: Secondary | ICD-10-CM

## 2021-04-15 DIAGNOSIS — Z800 Family history of malignant neoplasm of digestive organs: Secondary | ICD-10-CM

## 2021-04-15 DIAGNOSIS — R293 Abnormal posture: Secondary | ICD-10-CM | POA: Diagnosis present

## 2021-04-15 DIAGNOSIS — Z808 Family history of malignant neoplasm of other organs or systems: Secondary | ICD-10-CM

## 2021-04-15 DIAGNOSIS — Z8042 Family history of malignant neoplasm of prostate: Secondary | ICD-10-CM

## 2021-04-15 LAB — CBC WITH DIFFERENTIAL (CANCER CENTER ONLY)
Abs Immature Granulocytes: 0 10*3/uL (ref 0.00–0.07)
Basophils Absolute: 0 10*3/uL (ref 0.0–0.1)
Basophils Relative: 1 %
Eosinophils Absolute: 0.1 10*3/uL (ref 0.0–0.5)
Eosinophils Relative: 2 %
HCT: 35.5 % — ABNORMAL LOW (ref 36.0–46.0)
Hemoglobin: 12.2 g/dL (ref 12.0–15.0)
Immature Granulocytes: 0 %
Lymphocytes Relative: 31 %
Lymphs Abs: 1.3 10*3/uL (ref 0.7–4.0)
MCH: 29.8 pg (ref 26.0–34.0)
MCHC: 34.4 g/dL (ref 30.0–36.0)
MCV: 86.8 fL (ref 80.0–100.0)
Monocytes Absolute: 0.4 10*3/uL (ref 0.1–1.0)
Monocytes Relative: 9 %
Neutro Abs: 2.3 10*3/uL (ref 1.7–7.7)
Neutrophils Relative %: 57 %
Platelet Count: 276 10*3/uL (ref 150–400)
RBC: 4.09 MIL/uL (ref 3.87–5.11)
RDW: 12.5 % (ref 11.5–15.5)
WBC Count: 4.1 10*3/uL (ref 4.0–10.5)
nRBC: 0 % (ref 0.0–0.2)

## 2021-04-15 LAB — CMP (CANCER CENTER ONLY)
ALT: 14 U/L (ref 0–44)
AST: 19 U/L (ref 15–41)
Albumin: 4 g/dL (ref 3.5–5.0)
Alkaline Phosphatase: 61 U/L (ref 38–126)
Anion gap: 10 (ref 5–15)
BUN: 12 mg/dL (ref 6–20)
CO2: 24 mmol/L (ref 22–32)
Calcium: 9.3 mg/dL (ref 8.9–10.3)
Chloride: 108 mmol/L (ref 98–111)
Creatinine: 0.95 mg/dL (ref 0.44–1.00)
GFR, Estimated: >60 mL/min (ref >60)
Glucose, Bld: 98 mg/dL (ref 70–99)
Potassium: 4 mmol/L (ref 3.5–5.1)
Sodium: 142 mmol/L (ref 135–145)
Total Bilirubin: 1 mg/dL (ref 0.3–1.2)
Total Protein: 7.6 g/dL (ref 6.5–8.1)

## 2021-04-15 LAB — GENETIC SCREENING ORDER

## 2021-04-15 NOTE — Patient Instructions (Signed)

## 2021-04-15 NOTE — Progress Notes (Signed)
Radiation Oncology         [phone] ________________________________  Name: Sheila [name]        MRN: [id number]  Date of Service: 04/15/2021 DOB: [birth year]  GU:RKYH, Carolann Littler, MD  Rolm Bookbinder, MD     REFERRING PHYSICIAN: Rolm Bookbinder, MD   DIAGNOSIS: The encounter diagnosis was Malignant neoplasm of upper-outer quadrant of left breast in female, estrogen receptor positive ([place]).   HISTORY OF PRESENT ILLNESS: Sheila [name] is a 50 y.o. female seen in the multidisciplinary breast clinic for a new diagnosis of a left breast cancer. The patient was noted to have a screening detected left breast mass at 2:00 that measured 1.4 cm and a contiguous mass measuring 5 mm was also noted and in total these were measured at 2.1 cm and her axilla was negative for adenopathy. A biopsy on 04/03/21 showed a grade 2 invasive ductal carcinoma that was ER/PR positive, Her2 negative with a KI 67 of 15%. She is seen today to discuss treatment of her cancer.    PREVIOUS RADIATION THERAPY: No   PAST MEDICAL HISTORY:  Past Medical History:  Diagnosis Date  . Anemia   . Malignant neoplasm of upper-outer quadrant of left breast in female, estrogen receptor positive ([place]) 04/08/2021       PAST SURGICAL HISTORY: Past Surgical History:  Procedure Laterality Date  . LEFT HEART CATHETERIZATION WITH CORONARY ANGIOGRAM N/A 05/30/2014   Procedure: LEFT HEART CATHETERIZATION WITH CORONARY ANGIOGRAM;  Surgeon: Peter M Martinique, MD;  Location: [facility] CATH LAB;  Service: Cardiovascular;  Laterality: N/A;  . TUBAL LIGATION       FAMILY HISTORY:  Family History  Problem Relation Age of Onset  . Hypertension Mother   . Heart Problems Mother   . Hyperlipidemia Mother   . Breast cancer Mother   . Diabetes Father   . Seizures Father   . Hypertension Maternal Aunt   . Hypertension Maternal Uncle   . Cancer Maternal Grandmother   . Hypertension Maternal Grandmother   . Stroke Maternal  Grandmother   . Stroke Maternal Grandfather      SOCIAL HISTORY:  reports that she has never smoked. She has never used smokeless tobacco. She reports that she does not drink alcohol and does not use drugs.  The patient is married and lives in [place].  She is a Ship broker.    ALLERGIES: Patient has no known allergies.   MEDICATIONS:  Current Outpatient Medications  Medication Sig Dispense Refill  . albuterol (PROVENTIL HFA;VENTOLIN HFA) 108 (90 BASE) MCG/ACT inhaler Inhale 2 puffs into the lungs every 6 (six) hours as needed for wheezing or shortness of breath. (Patient not taking: Reported on 02/25/2021)    . aspirin EC 325 MG tablet Take 325 mg by mouth daily as needed (pain). (Patient not taking: Reported on 02/25/2021)    . clindamycin (CLEOCIN) 300 MG capsule Take 1 capsule (300 mg total) by mouth 3 (three) times daily. (Patient not taking: Reported on 02/25/2021) 21 capsule 0  . ELDERBERRY PO Take 1 tablet by mouth every morning. (Patient not taking: Reported on 02/25/2021)    . fluticasone (FLONASE) 50 MCG/ACT nasal spray Place 1 spray into both nostrils daily. (Patient not taking: Reported on 02/25/2021)    . metoprolol tartrate (LOPRESSOR) 50 MG tablet Take 2 tablets (100 mg total) by mouth once for 1 dose. TAKE TWO HOURS PRIOR TO  SCHEDULE CARDIAC TEST 2 tablet 0  . predniSONE (DELTASONE) 20 MG tablet Take 2  tablets (40 mg total) by mouth daily with breakfast. (Patient not taking: Reported on 02/25/2021) 10 tablet 0  . promethazine-dextromethorphan (PROMETHAZINE-DM) 6.25-15 MG/5ML syrup Take 5 mLs by mouth 4 (four) times daily as needed for cough. (Patient not taking: Reported on 02/25/2021) 100 mL 0   No current facility-administered medications for this encounter.     REVIEW OF SYSTEMS: On review of systems, the patient reports that she is doing well overall. She does have keloid scaring of her chest wall.     PHYSICAL EXAM:  Wt Readings from Last 3 Encounters:  02/25/21 215 lb  6.4 oz (97.7 kg)  12/09/20 182 lb (82.6 kg)  11/05/20 209 lb (94.8 kg)   Temp Readings from Last 3 Encounters:  04/05/21 98.9 F (37.2 C)  12/09/20 99.1 F (37.3 C) (Oral)  11/06/20 98 F (36.7 C)   BP Readings from Last 3 Encounters:  04/05/21 (!) 145/83  02/25/21 119/78  12/09/20 128/76   Pulse Readings from Last 3 Encounters:  04/05/21 76  02/25/21 77  12/09/20 91    In general this is a well appearing African-[name] female in no acute distress. She's alert and oriented x4 and appropriate throughout the examination. Cardiopulmonary assessment is negative for acute distress and she exhibits normal effort. Bilateral breast exam is deferred. But she has a large central keloid of her skin of her chest wall.    ECOG = 0  0 - Asymptomatic (Fully active, able to carry on all predisease activities without restriction)  1 - Symptomatic but completely ambulatory (Restricted in physically strenuous activity but ambulatory and able to carry out work of a light or sedentary nature. For example, light housework, office work)  2 - Symptomatic, <50% in bed during the day (Ambulatory and capable of all self care but unable to carry out any work activities. Up and about more than 50% of waking hours)  3 - Symptomatic, >50% in bed, but not bedbound (Capable of only limited self-care, confined to bed or chair 50% or more of waking hours)  4 - Bedbound (Completely disabled. Cannot carry on any self-care. Totally confined to bed or chair)  5 - Death   Eustace Pen MM, Creech RH, Tormey DC, et al. [phone]). "Toxicity and response criteria of the [facility] Group". [place] Oncol. 5 (6): 649-55    LABORATORY DATA:  Lab Results  Component Value Date   WBC 4.7 11/05/2020   HGB 11.9 (L) 11/05/2020   HCT 36.8 11/05/2020   MCV 89.1 11/05/2020   PLT 279 11/05/2020   Lab Results  Component Value Date   NA 141 11/05/2020   K 3.7 11/05/2020   CL 109 11/05/2020   CO2 23  11/05/2020   Lab Results  Component Value Date   ALT 18 03/29/2020   AST 24 03/29/2020   ALKPHOS 61 03/29/2020   BILITOT 1.3 (H) 03/29/2020      RADIOGRAPHY: CT ABDOMEN PELVIS WO CONTRAST  Result Date: 04/05/2021 CLINICAL DATA:  Lower abdominal pain spreading to the buttocks. EXAM: CT ABDOMEN AND PELVIS WITHOUT CONTRAST TECHNIQUE: Multidetector CT imaging of the abdomen and pelvis was performed following the standard protocol without IV contrast. COMPARISON:  04/29/2018 FINDINGS: LOWER CHEST: Normal. HEPATOBILIARY: Unchanged appearance of inferior right hepatic lobe lesion containing numerous calcifications. No biliary dilatation. No ascites. Gallbladder is normal. PANCREAS: Normal pancreas. No ductal dilatation or peripancreatic fluid collection. SPLEEN: Normal. ADRENALS/URINARY TRACT: The adrenal glands are normal. No hydronephrosis, nephroureterolithiasis or solid renal mass. The  urinary bladder is normal for degree of distention STOMACH/BOWEL: There is no hiatal hernia. Normal duodenal course and caliber. No small bowel dilatation or inflammation. No focal colonic abnormality. Normal appendix. VASCULAR/LYMPHATIC: Normal course and caliber of the major abdominal vessels. No abdominal or pelvic lymphadenopathy. REPRODUCTIVE: Normal uterus. No adnexal mass. MUSCULOSKELETAL. No bony spinal canal stenosis or focal osseous abnormality. OTHER: None. IMPRESSION: 1. No acute abnormality of the abdomen or pelvis. 2. Unchanged appearance of inferior right hepatic lobe lesion containing numerous calcifications. Electronically Signed   By: Ulyses Jarred M.D.   On: 04/05/2021 20:01   US BREAST LTD UNI LEFT INC AXILLA  Result Date: 03/31/2021 CLINICAL DATA:  Recall from screening mammography, possible mass involving the UPPER OUTER QUADRANT of the LEFT breast. Possible palpable lump in the UPPER OUTER LEFT breast on recent clinical examination. Strong family history of breast cancer in her mother, maternal  aunts, and maternal cousins. EXAM: DIGITAL DIAGNOSTIC UNILATERAL LEFT MAMMOGRAM WITH TOMOSYNTHESIS AND CAD; ULTRASOUND LEFT BREAST LIMITED TECHNIQUE: Left digital diagnostic mammography and breast tomosynthesis was performed. The images were evaluated with computer-aided detection.; Targeted ultrasound examination of the left breast was performed COMPARISON:  Previous exam(s). ACR Breast Density Category c: The breast tissue is heterogeneously dense, which may obscure small masses. FINDINGS: Spot-compression CC and MLO views of the area of concern were obtained. Spot compression views confirm a mass with mildly irregular margins in the UPPER OUTER QUADRANT measuring on the order of 1 cm, associated with architectural distortion and faint calcifications. Targeted ultrasound is performed, demonstrating an irregular hypoechoic mass with angular margins at the 2 o'clock position approximately 5 cm nipple measuring approximately 1.4 x 0.8 x 1.0 cm, demonstrating posterior acoustic shadowing, associated with microcalcifications, corresponding to the screening mammographic finding. Immediately adjacent to the dominant mass is a hypoechoic mass measuring 0.5 x 0.4 x 0.4 cm, demonstrating posterior acoustic enhancement and no internal power Doppler flow. Sonographic evaluation of the LEFT axilla demonstrates no pathologic lymphadenopathy. IMPRESSION: 1. Highly suspicious 1.4 cm mass involving the UPPER OUTER QUADRANT of the LEFT breast at 2 o'clock position cm nipple. 2. 0.5 cm mass in the UPPER OUTER QUADRANT immediately adjacent to the dominant mass. This may represent a satellite nodule but may represent a complex cyst, given its posterior acoustic enhancement. 3. No pathologic LEFT axillary lymphadenopathy. RECOMMENDATION: Ultrasound-guided core needle biopsy of the dominant 1.4 cm highly suspicious mass in the UPPER OUTER QUADRANT of the LEFT breast. The adjacent 0.5 cm mass can be sampled simultaneously. The ultrasound  core needle biopsy procedure was discussed with the patient and her questions were answered. She wishes to proceed and the biopsy has been scheduled for Friday, May 20 at 7:30 a.m. I have discussed the findings and recommendations with the patient and her husband. BI-RADS CATEGORY  5: Highly suggestive of malignancy. Electronically Signed   By: Evangeline Dakin M.D.   On: 03/31/2021 10:51   MM DIAG BREAST TOMO UNI LEFT  Result Date: 03/31/2021 CLINICAL DATA:  Recall from screening mammography, possible mass involving the UPPER OUTER QUADRANT of the LEFT breast. Possible palpable lump in the UPPER OUTER LEFT breast on recent clinical examination. Strong family history of breast cancer in her mother, maternal aunts, and maternal cousins. EXAM: DIGITAL DIAGNOSTIC UNILATERAL LEFT MAMMOGRAM WITH TOMOSYNTHESIS AND CAD; ULTRASOUND LEFT BREAST LIMITED TECHNIQUE: Left digital diagnostic mammography and breast tomosynthesis was performed. The images were evaluated with computer-aided detection.; Targeted ultrasound examination of the left breast was performed COMPARISON:  Previous  exam(s). ACR Breast Density Category c: The breast tissue is heterogeneously dense, which may obscure small masses. FINDINGS: Spot-compression CC and MLO views of the area of concern were obtained. Spot compression views confirm a mass with mildly irregular margins in the UPPER OUTER QUADRANT measuring on the order of 1 cm, associated with architectural distortion and faint calcifications. Targeted ultrasound is performed, demonstrating an irregular hypoechoic mass with angular margins at the 2 o'clock position approximately 5 cm nipple measuring approximately 1.4 x 0.8 x 1.0 cm, demonstrating posterior acoustic shadowing, associated with microcalcifications, corresponding to the screening mammographic finding. Immediately adjacent to the dominant mass is a hypoechoic mass measuring 0.5 x 0.4 x 0.4 cm, demonstrating posterior acoustic  enhancement and no internal power Doppler flow. Sonographic evaluation of the LEFT axilla demonstrates no pathologic lymphadenopathy. IMPRESSION: 1. Highly suspicious 1.4 cm mass involving the UPPER OUTER QUADRANT of the LEFT breast at 2 o'clock position cm nipple. 2. 0.5 cm mass in the UPPER OUTER QUADRANT immediately adjacent to the dominant mass. This may represent a satellite nodule but may represent a complex cyst, given its posterior acoustic enhancement. 3. No pathologic LEFT axillary lymphadenopathy. RECOMMENDATION: Ultrasound-guided core needle biopsy of the dominant 1.4 cm highly suspicious mass in the UPPER OUTER QUADRANT of the LEFT breast. The adjacent 0.5 cm mass can be sampled simultaneously. The ultrasound core needle biopsy procedure was discussed with the patient and her questions were answered. She wishes to proceed and the biopsy has been scheduled for Friday, May 20 at 7:30 a.m. I have discussed the findings and recommendations with the patient and her husband. BI-RADS CATEGORY  5: Highly suggestive of malignancy. Electronically Signed   By: Evangeline Dakin M.D.   On: 03/31/2021 10:51   MM 3D SCREEN BREAST BILATERAL  Result Date: 03/18/2021 CLINICAL DATA:  Screening. Patient reported a palpable mass in the LEFT breast during this screening study. EXAM: DIGITAL SCREENING BILATERAL MAMMOGRAM WITH TOMOSYNTHESIS AND CAD TECHNIQUE: Bilateral screening digital craniocaudal and mediolateral oblique mammograms were obtained. Bilateral screening digital breast tomosynthesis was performed. The images were evaluated with computer-aided detection. COMPARISON:  Previous exam(s). ACR Breast Density Category c: The breast tissue is heterogeneously dense, which may obscure small masses. FINDINGS: In the left breast, a possible mass warrants further evaluation. In the right breast, no findings suspicious for malignancy. Additionally, further evaluation is recommended for the area of patient's concern.  IMPRESSION: Further evaluation is suggested for a possible mass and palpable abnormality in the left breast. RECOMMENDATION: Diagnostic mammogram and possibly ultrasound of the left breast. (Code:[id number]) The patient will be contacted regarding the findings, and additional imaging will be scheduled. BI-RADS CATEGORY  0: Incomplete. Need additional imaging evaluation and/or prior mammograms for comparison. Electronically Signed   By: Nolon Nations M.D.   On: 03/18/2021 09:07   MM CLIP PLACEMENT LEFT  Result Date: 04/03/2021 CLINICAL DATA:  Status post ultrasound-guided biopsy of a LEFT breast mass at the 2 o'clock axis. EXAM: DIAGNOSTIC LEFT MAMMOGRAM POST ULTRASOUND BIOPSY COMPARISON:  Previous exam(s). FINDINGS: Mammographic images were obtained following ultrasound guided biopsy of the LEFT breast mass at the 2 o'clock axis. The biopsy marking clip is in expected position at the site of biopsy. IMPRESSION: Appropriate positioning of the ribbon shaped biopsy marking clip at the site of biopsy in the upper-outer quadrant of the LEFT breast corresponding to the targeted mass at the 2 o'clock axis. Final Assessment: Post Procedure Mammograms for Marker Placement Electronically Signed   By: Franki Cabot  M.D.   On: 04/03/2021 08:32   [place] LT BREAST BX W LOC DEV 1ST LESION IMG BX SPEC US GUIDE  Addendum Date: 04/06/2021   ADDENDUM REPORT: 04/06/2021 12:20 ADDENDUM: Pathology revealed GRADE II INVASIVE MAMMARY CARCINOMA of the Left breast, 2 o'clock. This was found to be concordant by Dr. Franki Cabot. Pathology results were discussed with the patient by telephone. The patient reported doing well after the biopsy with tenderness at the site. Post biopsy instructions and care were reviewed and questions were answered. The patient was encouraged to call [facility] for any additional concerns. My direct phone number was provided. The patient was referred to [facility] on April 15, 2021. Pathology results reported by Terie Purser, RN on 04/06/2021. Electronically Signed   By: Franki Cabot M.D.   On: 04/06/2021 12:20   Result Date: 04/06/2021 CLINICAL DATA:  Patient with a highly suspicious mass in the LEFT breast presents today for ultrasound-guided core biopsy. EXAM: ULTRASOUND GUIDED LEFT BREAST CORE NEEDLE BIOPSY COMPARISON:  Previous exam(s). PROCEDURE: I met with the patient and we discussed the procedure of ultrasound-guided biopsy, including benefits and alternatives. We discussed the high likelihood of a successful procedure. We discussed the risks of the procedure, including infection, bleeding, tissue injury, clip migration, and inadequate sampling. Informed written consent was given. The usual time-out protocol was performed immediately prior to the procedure. Lesion quadrant: Upper outer quadrant Using sterile technique and 1% Lidocaine as local anesthetic, under direct ultrasound visualization, a 12 gauge spring-loaded device was used to perform biopsy of the LEFT breast mass at the 2 o'clock axis using a lateral approach. At the conclusion of the procedure ribbon shaped tissue marker clip was deployed into the biopsy cavity. Follow up 2 view mammogram was performed and dictated separately. IMPRESSION: Ultrasound guided biopsy of the LEFT breast mass at the 2 o'clock axis. No apparent complications. Electronically Signed: By: Franki Cabot M.D. On: 04/03/2021 08:09       IMPRESSION/PLAN: 1. Stage IA, [id number] grade 2 ER/PR positive invasive ductal carcinoma of the left breast. Dr. Lisbeth Renshaw discusses the pathology findings and reviews the nature of early stage left breast disease. The consensus from the breast conference includes breast conservation with lumpectomy with sentinel node biopsy. Dr. Burr Medico anticipates Oncotype Dx score to determine a role for systemic therapy. Provided that chemotherapy is not  indicated, the patient's course would then be followed by external radiotherapy to the breast  to reduce risks of local recurrence followed by antiestrogen therapy. We discussed the risks, benefits, short, and long term effects of radiotherapy, as well as the curative intent, and the patient is interested in proceeding. Dr. Lisbeth Renshaw discusses the delivery and logistics of radiotherapy and anticipates a course of 4 or up to 6 1/2 weeks of radiotherapy to the left breast with deep inspiration breath-hold technique. We will see her back a few weeks after surgery to discuss the simulation process and anticipate we starting radiotherapy about 4-6 weeks after surgery.  2. Contraceptive counseling.  The patient has had a tubal ligation and no additional pregnancy testing will be recommended prior to proceeding with simulation and treatment. 3. Keloid Scar. The patient may be interested in having this site treated in the future and we will follow this expectantly.    In a visit lasting 60 minutes, greater than 50% of the time was spent face to face reviewing her case, as  well as in preparation of, discussing, and coordinating the patient's care.  The above documentation reflects my direct findings during this shared patient visit. Please see the separate note by Dr. Lisbeth Renshaw on this date for the remainder of the patient's plan of care.    Carola Rhine, [facility]    **Disclaimer: This note was dictated with voice recognition software. Similar sounding words can inadvertently be transcribed and this note may contain transcription errors which may not have been corrected upon publication of note.**

## 2021-04-15 NOTE — Progress Notes (Signed)
[facility] Work  Initial Assessment   Sheila [name] is a 50 y.o. year old female accompanied by patient and mother. Clinical Social Work was referred by [facility] for assessment of psychosocial needs.   SDOH (Social Determinants of Health) assessments performed: Yes SDOH Interventions   Flowsheet Row Most Recent Value  SDOH Interventions   Food Insecurity Interventions Intervention Not Indicated  Housing Interventions Intervention Not Indicated  Transportation Interventions Intervention Not Indicated      Distress Screen completed: Yes ONCBCN DISTRESS SCREENING 04/15/2021  Screening Type Initial Screening  Distress experienced in past week (1-10) 8      Family/Social Information:  . Housing Arrangement: patient lives with husband, youngest (40yo) son. 3 other adult children (47, 22, 73) and grandchildren (29, 49, 35- 28 yo) live locally . Family members/support persons in your life? Family, faith community . Transportation concerns: no  . 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 Financial concerns: No o Type of concern: None . Food access concerns: no . Religious or spiritual practice: yes, faith is very important to patient . Services Currently in place:  n/a  Coping/ Adjustment to diagnosis: . Patient understands treatment plan and what happens next? Yes. She is feeling slightly less distressed after meeting with medical team and hearing the plan although is still nervous about treatment, especially surgery . Concerns about diagnosis and/or treatment: Overwhelmed by information and Afraid of cancer . Patient reported stressors: Adjusting to my illness . Patient enjoys time with family/ friends and working on her yard, talking to her mom . Current coping skills/ strengths: Capable of independent living, Religious Affiliation and Supportive family/friends    SUMMARY: Current SDOH Barriers:  . No significant SDOH barriers noted  today  Interventions: . Discussed common feeling and emotions when being diagnosed with cancer, and the importance of support during treatment . Informed patient of the support team roles and support services at [facility] . Provided CSW contact information and encouraged patient to call with any questions or concerns . Referred patient to chaplain for spiritual care support and for Alight guide/ peer mentor   Follow Up Plan: Patient will contact CSW with any support or resource needs Patient verbalizes understanding of plan: Yes    Christeen Douglas , LCSW

## 2021-04-15 NOTE — Therapy (Signed)
[place], [place], [address] Phone: [phone]   Fax:  [phone]  Physical Therapy Evaluation  Patient Details  Name: Sheila [name] MRN: [id number] Date of Birth: 03-08-1971 Referring Provider (PT): Dr Rolm Bookbinder   Encounter Date: 04/15/2021   PT End of Session - 04/15/21 1231    Visit Number 1    Number of Visits 2    Date for PT Re-Evaluation 06/10/21    PT Start Time 1020    PT Stop Time 1050    PT Time Calculation (min) 30 min    Activity Tolerance Patient tolerated treatment well    Behavior During Therapy [facility] for tasks assessed/performed           Past Medical History:  Diagnosis Date  . Anemia   . Breast cancer ([place])   . Malignant neoplasm of upper-outer quadrant of left breast in female, estrogen receptor positive ([place]) 04/08/2021    Past Surgical History:  Procedure Laterality Date  . LEFT HEART CATHETERIZATION WITH CORONARY ANGIOGRAM N/A 05/30/2014   Procedure: LEFT HEART CATHETERIZATION WITH CORONARY ANGIOGRAM;  Surgeon: Peter M Martinique, MD;  Location: [facility] CATH LAB;  Service: Cardiovascular;  Laterality: N/A;  . TUBAL LIGATION      There were no vitals filed for this visit.    Subjective Assessment - 04/15/21 1101    Subjective Patient reports she is here today to be seen by her medical team for her newly diagnosed left breast cancer.    Pertinent History Patient was diagnosed on 03/17/2021 with left grade II invasive ductal carcinoma breast cancer. There are 2 masses that measure 1.4 cm and 5 mm located in the upper outer quadrant. It is ER/PR positive and HER2 negative with a Ki67 of 15%.    Patient Stated Goals Reduce lymphedema risk and learn post op shoulder ROM HEP    Currently in Pain? Yes    Pain Score 5     Pain Location Hand    Pain Orientation Right    Pain Descriptors / Indicators Aching    Pain Type Chronic pain    Aggravating Factors  trying to open jar  or door    Pain Relieving Factors unknown              [facility] PT Assessment - 04/15/21 0001      Assessment   Medical Diagnosis Left breast cancer    Referring Provider (PT) Dr Rolm Bookbinder    Onset Date/Surgical Date 03/17/21    Hand Dominance Right    Prior Therapy none      Precautions   Precautions Other (comment)    Precaution Comments active cancer      Restrictions   Weight Bearing Restrictions No      Balance Screen   Has the patient fallen in the past 6 months No    Has the patient had a decrease in activity level because of a fear of falling?  No    Is the patient reluctant to leave their home because of a fear of falling?  No      Home Environment   Living Environment Private residence    Living Arrangements Spouse/significant other;Children   Husband and 53 y.o. son   Available Help at Discharge Family      Prior Function   Level of [place] Requirements Finishing a degree at [employer] She does  not exercise      Cognition   Overall Cognitive Status Within Functional Limits for tasks assessed      Posture/Postural Control   Posture/Postural Control Postural limitations    Postural Limitations Rounded Shoulders;Forward head      ROM / Strength   AROM / PROM / Strength AROM;Strength      AROM   Overall AROM Comments Cervical AROM is WNL    AROM Assessment Site Shoulder    Right/Left Shoulder Right;Left    Right Shoulder Extension 50 Degrees    Right Shoulder Flexion 158 Degrees    Right Shoulder ABduction 154 Degrees    Right Shoulder Internal Rotation 71 Degrees    Right Shoulder External Rotation 80 Degrees    Left Shoulder Extension 50 Degrees    Left Shoulder Flexion 157 Degrees    Left Shoulder ABduction 170 Degrees    Left Shoulder Internal Rotation 58 Degrees    Left Shoulder External Rotation 73 Degrees      Strength   Overall Strength Within functional limits for tasks  performed             LYMPHEDEMA/ONCOLOGY QUESTIONNAIRE - 04/15/21 0001      Type   Cancer Type Left breast cancer      Lymphedema Assessments   Lymphedema Assessments Upper extremities      Right Upper Extremity Lymphedema   10 cm Proximal to Olecranon Process 33.7 cm    Olecranon Process 27.8 cm    10 cm Proximal to Ulnar Styloid Process 23.9 cm    Just Proximal to Ulnar Styloid Process 15.8 cm    Across Hand at [employer] 19.8 cm    At [place] of 2nd Digit 6.3 cm      Left Upper Extremity Lymphedema   10 cm Proximal to Olecranon Process 35.9 cm    Olecranon Process 28 cm    10 cm Proximal to Ulnar Styloid Process 23 cm    Just Proximal to Ulnar Styloid Process 15.8 cm    Across Hand at [employer] 19.9 cm    At [place] of 2nd Digit 6.3 cm           L-DEX FLOWSHEETS - 04/15/21 1200      L-DEX LYMPHEDEMA SCREENING   Measurement Type Unilateral    L-DEX MEASUREMENT EXTREMITY Upper Extremity    POSITION  Standing    DOMINANT SIDE Right    At Risk Side Left    BASELINE SCORE (UNILATERAL) 0           The patient was assessed using the L-Dex machine today to produce a lymphedema index baseline score. The patient will be reassessed on a regular basis (typically every 3 months) to obtain new L-Dex scores. If the score is > 6.5 points away from his/her baseline score indicating onset of subclinical lymphedema, it will be recommended to wear a compression garment for 4 weeks, 12 hours per day and then be reassessed. If the score continues to be > 6.5 points from baseline at reassessment, we will initiate lymphedema treatment. Assessing in this manner has a 95% rate of preventing clinically significant lymphedema.      Katina Dung - 04/15/21 0001    Open a tight or new jar Moderate difficulty    Do heavy household chores (wash walls, wash floors) No difficulty    Carry a shopping bag or briefcase No difficulty    Wash your back Mild difficulty    Use a  knife to cut  food No difficulty    Recreational activities in which you take some force or impact through your arm, shoulder, or hand (golf, hammering, tennis) No difficulty    During the past week, to what extent has your arm, shoulder or hand problem interfered with your normal social activities with family, friends, neighbors, or groups? Not at all    During the past week, to what extent has your arm, shoulder or hand problem limited your work or other regular daily activities [place]    Arm, shoulder, or hand pain. Mild    Tingling (pins and needles) in your arm, shoulder, or hand Mild    Difficulty Sleeping Mild difficulty    DASH Score 18.18 %            Objective measurements completed on examination: See above findings.        Patient was instructed today in a home exercise program today for post op shoulder range of motion. These included active assist shoulder flexion in sitting, scapular retraction, wall walking with shoulder abduction, and hands behind head external rotation.  She was encouraged to do these twice a day, holding 3 seconds and repeating 5 times when permitted by her physician.           PT Education - 04/15/21 1230    Education Details Lymphedema risk reduction and post op shoulder ROM HEP    Person(s) Educated Patient;Parent(s)    Methods Explanation;Demonstration;Verbal cues    Comprehension Returned demonstration;Verbalized understanding               PT Long Term Goals - 04/15/21 1234      PT LONG TERM GOAL #1   Title Patient will demonstrate she has regainde full shoulder ROM and function post operatively compared to baselines.    Time 8    Period Weeks    Status New    Target Date 06/10/21           Breast Clinic Goals - 04/15/21 1234      Patient will be able to verbalize understanding of pertinent lymphedema risk reduction practices relevant to her diagnosis specifically related to skin care.   Time 1    Period Days    Status  Achieved      Patient will be able to return demonstrate and/or verbalize understanding of the post-op home exercise program related to regaining shoulder range of motion.   Time 1    Period Days    Status Achieved      Patient will be able to verbalize understanding of the importance of attending the postoperative After Breast Cancer Class for further lymphedema risk reduction education and therapeutic exercise.   Time 1    Period Days    Status Achieved                 Plan - 04/15/21 1232    Clinical Impression Statement Patient was diagnosed on 03/17/2021 with left grade II invasive ductal carcinoma breast cancer. There are 2 masses that measure 1.4 cm and 5 mm located in the upper outer quadrant. It is ER/PR positive and HER2 negative with a Ki67 of 15%. Her multidisciplinary medical team met prior to her asseessments to determine a recommended treatment plan. She is planning to have a left lumpectomy and sentinel node biopsy followed by Oncotype testing, radiation, and anti-estrogen therapy. She will benefit from a post op PT reassessment and from L-Dex screens every 3 months for 2 years  to detect subclinical lymphedema.    Stability/Clinical Decision Making Stable/Uncomplicated    Clinical Decision Making Low    Rehab Potential Excellent    PT Frequency --   Eval and 1 f/u visit   PT Treatment/Interventions ADLs/Self Care Home Management;Therapeutic exercise;Patient/family education    PT Next Visit Plan Will reassess 3-4 weeks post op    PT Home Exercise Plan Post op shoulder ROM HEP    Consulted and Agree with Plan of Care Patient;Family member/caregiver    Family Member Consulted Mom           Patient will benefit from skilled therapeutic intervention in order to improve the following deficits and impairments:  Decreased knowledge of precautions,Impaired UE functional use,Pain,Postural dysfunction,Decreased range of motion  Visit Diagnosis: Malignant neoplasm of  upper-outer quadrant of left breast in female, estrogen receptor positive ([place]) - Plan: PT plan of care cert/re-cert  Abnormal posture - Plan: PT plan of care cert/re-cert   Patient will follow up at outpatient cancer rehab 3-4 weeks following surgery.  If the patient requires physical therapy at that time, a specific plan will be dictated and sent to the referring physician for approval. The patient was educated today on appropriate basic range of motion exercises to begin post operatively and the importance of attending the After Breast Cancer class following surgery.  Patient was educated today on lymphedema risk reduction practices as it pertains to recommendations that will benefit the patient immediately following surgery.  She verbalized good understanding.      Problem List Patient Active Problem List   Diagnosis Date Noted  . Malignant neoplasm of upper-outer quadrant of left breast in female, estrogen receptor positive ([place]) 04/08/2021  . Women's annual routine gynecological examination 02/25/2021  . Screening examination for STD (sexually transmitted disease) 02/25/2021  . Skin lesion 02/25/2021  . Chest pain at rest 05/30/2014  . Ejection fraction   . Abnormal EKG 04/24/2014  . Chest discomfort 04/23/2014  . Panic attack 04/23/2014  . OBESITY 01/15/2009  . HAIR LOSS 01/15/2009  . ANEMIA-NOS 01/14/2009   Annia Friendly, PT 04/15/21 12:39 PM  [place], [place], [address] Phone: [phone]   Fax:  [phone]  Name: Sheila [name] MRN: [id number] Date of Birth: 03-31-1971

## 2021-04-15 NOTE — Progress Notes (Signed)
[place]   [phone] [phone] [place] Note   Patient Care Team: Griffin Basil, MD as PCP - General (Obstetrics and Gynecology) Elouise [name], MD as Consulting Physician (Cardiology) Mauro Kaufmann, RN as Oncology Nurse Navigator Rockwell Germany, RN as Oncology Nurse Navigator Rolm Bookbinder, MD as Consulting Physician (General Surgery) Truitt Merle, MD as Consulting Physician (Hematology) Kyung Rudd, MD as Consulting Physician (Radiation Oncology)  Date of Service:  04/15/2021   CHIEF COMPLAINTS/PURPOSE OF CONSULTATION:  Newly diagnosed Malignant neoplasm of upper-outer quadrant of left breast   Oncology History Overview Note  Cancer Staging Malignant neoplasm of upper-outer quadrant of left breast in female, estrogen receptor positive ([place]) Staging form: Breast, AJCC 8th Edition - Clinical stage from 04/03/2021: Stage IA (cT1c, cN0, cM0, G2, ER+, PR+, HER2-) - Signed by Truitt Merle, MD on 04/14/2021 Stage prefix: Initial diagnosis Histologic grading system: 3 grade system    Malignant neoplasm of upper-outer quadrant of left breast in female, estrogen receptor positive ([place])  03/31/2021 Mammogram   Targeted ultrasound is performed, demonstrating an irregular hypoechoic mass with angular margins at the 2 o'clock position approximately 5 cm nipple measuring approximately 1.4 x 0.8 x 1.0 cm, demonstrating posterior acoustic shadowing, associated with microcalcifications, corresponding to the screening mammographic finding.   Immediately adjacent to the dominant mass is a hypoechoic mass measuring 0.5 x 0.4 x 0.4 cm, demonstrating posterior acoustic enhancement and no internal power Doppler flow.   Sonographic evaluation of the LEFT axilla demonstrates no pathologic lymphadenopathy.   04/03/2021 Cancer Staging   Staging form: Breast, AJCC 8th Edition - Clinical stage from 04/03/2021: Stage IA (cT1c, cN0, cM0, G2,  ER+, PR+, HER2-) - Signed by Truitt Merle, MD on 04/14/2021 Stage prefix: Initial diagnosis Histologic grading system: 3 grade system   04/03/2021 Initial Biopsy   Diagnosis Breast, left, needle core biopsy, 2 o'clock - INVASIVE MAMMARY CARCINOMA - SEE COMMENT Microscopic Comment The biopsy material shows an infiltrative proliferation of cells with large vesicular nuclei with inconspicuous nucleoli, arranged linearly and in small clusters. Based on the biopsy, the carcinoma appears Nottingham grade 2 of 3 and measures 1.0 cm in greatest linear extent. E-cadherin and prognostic markers (ER/PR/ki-67/HER2)are pending and will be reported in an addendum. Dr. Jeannie Done reviewed the case and agrees with the above diagnosis. These results were called to [facility] on Apr 06, 2021.  E-cadherin is POSITIVE supporting a ductal origin.   04/03/2021 Receptors her2   PROGNOSTIC INDICATORS Results: IMMUNOHISTOCHEMICAL AND MORPHOMETRIC ANALYSIS PERFORMED MANUALLY The tumor cells are NEGATIVE for Her2 (1+). Estrogen Receptor: 70%, POSITIVE, STRONG STAINING INTENSITY Progesterone Receptor: >95%, POSITIVE, STRONG STAINING INTENSITY Proliferation Marker Ki67: 15%   04/05/2021 Imaging   CT AP  IMPRESSION: 1. No acute abnormality of the abdomen or pelvis. 2. Unchanged appearance of inferior right hepatic lobe lesion containing numerous calcifications.     04/08/2021 Initial Diagnosis   Malignant neoplasm of upper-outer quadrant of left breast in female, estrogen receptor positive ([place])      HISTORY OF PRESENTING ILLNESS:  Sheila [name] 50 y.o. female is a here because of newly diagnosed left breast cancer. The patient presents to the Breast clinic today accompanied by her mother.  Her left breast mass was palpated on lateral left breast exam on May 3rd. She notes she felt it then as well. She has noticed since having right breast pain but no other pain. She notes recent onset of  low back pain, from sciatic nerve in late may. She had benign CT AP. Her back pain is resolving now. She has baseline left knee pain.   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 I reviewed her medication list with her. She notes she does not like to take medication. Her mother had breast cancer at 86. MGM and multiple cousins with breast cancer. She is agreeable with genetics. She notes she had mood swings, weight gain and hot flashes with menopause. Theses symptoms have much improved.   Socially she is married. She has 4 living children. She attends school at [employer] for [employer] studies.    GYN HISTORY  Menarchal: 11 1/2 LMP: 2 years ago, age 67 Contraceptive: Depo vera injections  HRT: No G9P4A5: she had multiple miscarriages, she did breast feed - first pregnancy at age 7, first birth at age 63.      REVIEW OF SYSTEMS:    Constitutional: Denies fevers, chills or abnormal night sweats Eyes: Denies blurriness of vision, double vision or watery eyes Ears, nose, mouth, throat, and face: Denies mucositis or sore throat Respiratory: Denies cough, dyspnea or wheezes Cardiovascular: Denies palpitation, chest discomfort or lower extremity swelling Gastrointestinal:  Denies nausea, heartburn or change in bowel habits Skin: Denies abnormal skin rashes (+) Multiple skin Keloids MSK: (+) Low back pain, left knee pain  Lymphatics: Denies new lymphadenopathy or easy bruising Neurological:Denies numbness, tingling or new weaknesses Behavioral/Psych: Mood is stable, no new changes  Breast: (+) Palpable left breast mass All other systems were reviewed with the patient and are negative.   MEDICAL HISTORY:  Past Medical History:  Diagnosis Date  . Anemia   . Breast cancer ([place])   . Malignant neoplasm of upper-outer quadrant of left breast in female, estrogen receptor positive ([place]) 04/08/2021    SURGICAL HISTORY: Past Surgical History:  Procedure  Laterality Date  . LEFT HEART CATHETERIZATION WITH CORONARY ANGIOGRAM N/A 05/30/2014   Procedure: LEFT HEART CATHETERIZATION WITH CORONARY ANGIOGRAM;  Surgeon: Peter M Martinique, MD;  Location: [facility] CATH LAB;  Service: Cardiovascular;  Laterality: N/A;  . TUBAL LIGATION      SOCIAL HISTORY: Social History   Socioeconomic History  . Marital status: Married    Spouse name: Not on file  . Number of children: 4  . Years of education: Not on file  . Highest education level: Not on file  Occupational History  . Occupation: Medical laboratory scientific officer: [employer]  Tobacco Use  . Smoking status: Never Smoker  . Smokeless tobacco: Never Used  Substance and Sexual Activity  . Alcohol use: No  . Drug use: No  . Sexual activity: Yes    Birth control/protection: Surgical  Other Topics Concern  . Not on file  Social History Narrative  . Not on file   Social Determinants of Health   Financial Resource Strain: Not on file  Food Insecurity: Not on file  Transportation Needs: Not on file  Physical Activity: Not on file  Stress: Not on file  Social Connections: Not on file  Intimate Partner Violence: Not on file    FAMILY HISTORY: Family History  Problem Relation Age of Onset  . Hypertension Mother   . Heart Problems Mother   . Hyperlipidemia Mother   . Breast cancer Mother 59  . Diabetes Father   . Seizures Father   . Hypertension Maternal Aunt   . Hypertension Maternal Uncle   . Cancer Maternal  Grandmother   . Hypertension Maternal Grandmother   . Stroke Maternal Grandmother   . Stroke Maternal Grandfather     ALLERGIES:  has No Known Allergies.  MEDICATIONS:  Current Outpatient Medications  Medication Sig Dispense Refill  . albuterol (PROVENTIL HFA;VENTOLIN HFA) 108 (90 BASE) MCG/ACT inhaler Inhale 2 puffs into the lungs every 6 (six) hours as needed for wheezing or shortness of breath. (Patient not taking: Reported on 02/25/2021)    . aspirin EC 325 MG tablet Take 325 mg  by mouth daily as needed (pain). (Patient not taking: Reported on 02/25/2021)    . ELDERBERRY PO Take 1 tablet by mouth every morning. (Patient not taking: Reported on 02/25/2021)    . fluticasone (FLONASE) 50 MCG/ACT nasal spray Place 1 spray into both nostrils daily. (Patient not taking: Reported on 02/25/2021)     No current facility-administered medications for this visit.    PHYSICAL EXAMINATION: ECOG PERFORMANCE STATUS: 0 - Asymptomatic   Vitals:   04/15/21 0858  BP: 127/84  Pulse: 82  Resp: 20  Temp: (!) 97.3 F (36.3 C)  SpO2: 97%   Filed Weights   04/15/21 0858  Weight: 96 kg    GENERAL:alert, no distress and comfortable SKIN: skin color, texture, turgor are normal, no rashes (+) Multiple skin keloids, mainly on chest EYES: normal, Conjunctiva are pink and non-injected, sclera clear  NECK: supple, thyroid normal size, non-tender, without nodularity LYMPH:  no palpable lymphadenopathy in the cervical, axillary  LUNGS: clear to auscultation and percussion with normal breathing effort HEART: regular rate & rhythm and no murmurs and no lower extremity edema ABDOMEN:abdomen soft, non-tender and normal bowel sounds Musculoskeletal:no cyanosis of digits and no clubbing  NEURO: alert & oriented x 3 with fluent speech, no focal motor/sensory deficits BREAST: (+) Palpable left lateral breast mass 3.5x1.5cm, with mild hematoma (+) Skin ecchymosis at biopsy site. Right Breast exam benign.  LABORATORY DATA:  I have reviewed the data as listed CBC Latest Ref Rng & Units 04/15/2021 11/05/2020 03/29/2020  WBC 4.0 - 10.5 K/uL 4.1 4.7 3.9(L)  Hemoglobin 12.0 - 15.0 g/dL 12.2 11.9(L) 12.3  Hematocrit 36.0 - 46.0 % 35.5(L) 36.8 37.7  Platelets 150 - 400 K/uL 276 279 303    CMP Latest Ref Rng & Units 04/15/2021 11/05/2020 06/30/2020  Glucose 70 - 99 mg/dL 98 96 88  BUN 6 - 20 mg/dL _0 Creatinine 0.44 - 1.00 mg/dL 0.95 0.87 0.93  Sodium 135 - 145 mmol/L 142 141 143  Potassium 3.5 -  5.1 mmol/L 4.0 3.7 4.7  Chloride 98 - 111 mmol/L 108 109 106  CO2 22 - 32 mmol/L _1 Calcium 8.9 - 10.3 mg/dL 9.3 9.1 9.5  Total Protein 6.5 - 8.1 g/dL 7.6 - -  Total Bilirubin 0.3 - 1.2 mg/dL 1.0 - -  Alkaline Phos 38 - 126 U/L 61 - -  AST 15 - 41 U/L 19 - -  ALT 0 - 44 U/L 14 - -     RADIOGRAPHIC STUDIES: I have personally reviewed the radiological images as listed and agreed with the findings in the report. CT ABDOMEN PELVIS WO CONTRAST  Result Date: 04/05/2021 CLINICAL DATA:  Lower abdominal pain spreading to the buttocks. EXAM: CT ABDOMEN AND PELVIS WITHOUT CONTRAST TECHNIQUE: Multidetector CT imaging of the abdomen and pelvis was performed following the standard protocol without IV contrast. COMPARISON:  04/29/2018 FINDINGS: LOWER CHEST: Normal. HEPATOBILIARY: Unchanged appearance of inferior right hepatic lobe lesion containing numerous  calcifications. No biliary dilatation. No ascites. Gallbladder is normal. PANCREAS: Normal pancreas. No ductal dilatation or peripancreatic fluid collection. SPLEEN: Normal. ADRENALS/URINARY TRACT: The adrenal glands are normal. No hydronephrosis, nephroureterolithiasis or solid renal mass. The urinary bladder is normal for degree of distention STOMACH/BOWEL: There is no hiatal hernia. Normal duodenal course and caliber. No small bowel dilatation or inflammation. No focal colonic abnormality. Normal appendix. VASCULAR/LYMPHATIC: Normal course and caliber of the major abdominal vessels. No abdominal or pelvic lymphadenopathy. REPRODUCTIVE: Normal uterus. No adnexal mass. MUSCULOSKELETAL. No bony spinal canal stenosis or focal osseous abnormality. OTHER: None. IMPRESSION: 1. No acute abnormality of the abdomen or pelvis. 2. Unchanged appearance of inferior right hepatic lobe lesion containing numerous calcifications. Electronically Signed   By: Ulyses Jarred M.D.   On: 04/05/2021 20:01   US BREAST LTD UNI LEFT INC AXILLA  Result Date:  03/31/2021 CLINICAL DATA:  Recall from screening mammography, possible mass involving the UPPER OUTER QUADRANT of the LEFT breast. Possible palpable lump in the UPPER OUTER LEFT breast on recent clinical examination. Strong family history of breast cancer in her mother, maternal aunts, and maternal cousins. EXAM: DIGITAL DIAGNOSTIC UNILATERAL LEFT MAMMOGRAM WITH TOMOSYNTHESIS AND CAD; ULTRASOUND LEFT BREAST LIMITED TECHNIQUE: Left digital diagnostic mammography and breast tomosynthesis was performed. The images were evaluated with computer-aided detection.; Targeted ultrasound examination of the left breast was performed COMPARISON:  Previous exam(s). ACR Breast Density Category c: The breast tissue is heterogeneously dense, which may obscure small masses. FINDINGS: Spot-compression CC and MLO views of the area of concern were obtained. Spot compression views confirm a mass with mildly irregular margins in the UPPER OUTER QUADRANT measuring on the order of 1 cm, associated with architectural distortion and faint calcifications. Targeted ultrasound is performed, demonstrating an irregular hypoechoic mass with angular margins at the 2 o'clock position approximately 5 cm nipple measuring approximately 1.4 x 0.8 x 1.0 cm, demonstrating posterior acoustic shadowing, associated with microcalcifications, corresponding to the screening mammographic finding. Immediately adjacent to the dominant mass is a hypoechoic mass measuring 0.5 x 0.4 x 0.4 cm, demonstrating posterior acoustic enhancement and no internal power Doppler flow. Sonographic evaluation of the LEFT axilla demonstrates no pathologic lymphadenopathy. IMPRESSION: 1. Highly suspicious 1.4 cm mass involving the UPPER OUTER QUADRANT of the LEFT breast at 2 o'clock position cm nipple. 2. 0.5 cm mass in the UPPER OUTER QUADRANT immediately adjacent to the dominant mass. This may represent a satellite nodule but may represent a complex cyst, given its posterior  acoustic enhancement. 3. No pathologic LEFT axillary lymphadenopathy. RECOMMENDATION: Ultrasound-guided core needle biopsy of the dominant 1.4 cm highly suspicious mass in the UPPER OUTER QUADRANT of the LEFT breast. The adjacent 0.5 cm mass can be sampled simultaneously. The ultrasound core needle biopsy procedure was discussed with the patient and her questions were answered. She wishes to proceed and the biopsy has been scheduled for Friday, May 20 at 7:30 a.m. I have discussed the findings and recommendations with the patient and her husband. BI-RADS CATEGORY  5: Highly suggestive of malignancy. Electronically Signed   By: Evangeline Dakin M.D.   On: 03/31/2021 10:51   MM DIAG BREAST TOMO UNI LEFT  Result Date: 03/31/2021 CLINICAL DATA:  Recall from screening mammography, possible mass involving the UPPER OUTER QUADRANT of the LEFT breast. Possible palpable lump in the UPPER OUTER LEFT breast on recent clinical examination. Strong family history of breast cancer in her mother, maternal aunts, and maternal cousins. EXAM: DIGITAL DIAGNOSTIC UNILATERAL LEFT MAMMOGRAM WITH  TOMOSYNTHESIS AND CAD; ULTRASOUND LEFT BREAST LIMITED TECHNIQUE: Left digital diagnostic mammography and breast tomosynthesis was performed. The images were evaluated with computer-aided detection.; Targeted ultrasound examination of the left breast was performed COMPARISON:  Previous exam(s). ACR Breast Density Category c: The breast tissue is heterogeneously dense, which may obscure small masses. FINDINGS: Spot-compression CC and MLO views of the area of concern were obtained. Spot compression views confirm a mass with mildly irregular margins in the UPPER OUTER QUADRANT measuring on the order of 1 cm, associated with architectural distortion and faint calcifications. Targeted ultrasound is performed, demonstrating an irregular hypoechoic mass with angular margins at the 2 o'clock position approximately 5 cm nipple measuring approximately  1.4 x 0.8 x 1.0 cm, demonstrating posterior acoustic shadowing, associated with microcalcifications, corresponding to the screening mammographic finding. Immediately adjacent to the dominant mass is a hypoechoic mass measuring 0.5 x 0.4 x 0.4 cm, demonstrating posterior acoustic enhancement and no internal power Doppler flow. Sonographic evaluation of the LEFT axilla demonstrates no pathologic lymphadenopathy. IMPRESSION: 1. Highly suspicious 1.4 cm mass involving the UPPER OUTER QUADRANT of the LEFT breast at 2 o'clock position cm nipple. 2. 0.5 cm mass in the UPPER OUTER QUADRANT immediately adjacent to the dominant mass. This may represent a satellite nodule but may represent a complex cyst, given its posterior acoustic enhancement. 3. No pathologic LEFT axillary lymphadenopathy. RECOMMENDATION: Ultrasound-guided core needle biopsy of the dominant 1.4 cm highly suspicious mass in the UPPER OUTER QUADRANT of the LEFT breast. The adjacent 0.5 cm mass can be sampled simultaneously. The ultrasound core needle biopsy procedure was discussed with the patient and her questions were answered. She wishes to proceed and the biopsy has been scheduled for Friday, May 20 at 7:30 a.m. I have discussed the findings and recommendations with the patient and her husband. BI-RADS CATEGORY  5: Highly suggestive of malignancy. Electronically Signed   By: Evangeline Dakin M.D.   On: 03/31/2021 10:51   MM 3D SCREEN BREAST BILATERAL  Result Date: 03/18/2021 CLINICAL DATA:  Screening. Patient reported a palpable mass in the LEFT breast during this screening study. EXAM: DIGITAL SCREENING BILATERAL MAMMOGRAM WITH TOMOSYNTHESIS AND CAD TECHNIQUE: Bilateral screening digital craniocaudal and mediolateral oblique mammograms were obtained. Bilateral screening digital breast tomosynthesis was performed. The images were evaluated with computer-aided detection. COMPARISON:  Previous exam(s). ACR Breast Density Category c: The breast tissue is  heterogeneously dense, which may obscure small masses. FINDINGS: In the left breast, a possible mass warrants further evaluation. In the right breast, no findings suspicious for malignancy. Additionally, further evaluation is recommended for the area of patient's concern. IMPRESSION: Further evaluation is suggested for a possible mass and palpable abnormality in the left breast. RECOMMENDATION: Diagnostic mammogram and possibly ultrasound of the left breast. (Code:[id number]-[id number]) The patient will be contacted regarding the findings, and additional imaging will be scheduled. BI-RADS CATEGORY  0: Incomplete. Need additional imaging evaluation and/or prior mammograms for comparison. Electronically Signed   By: Nolon Nations M.D.   On: 03/18/2021 09:07   MM CLIP PLACEMENT LEFT  Result Date: 04/03/2021 CLINICAL DATA:  Status post ultrasound-guided biopsy of a LEFT breast mass at the 2 o'clock axis. EXAM: DIAGNOSTIC LEFT MAMMOGRAM POST ULTRASOUND BIOPSY COMPARISON:  Previous exam(s). FINDINGS: Mammographic images were obtained following ultrasound guided biopsy of the LEFT breast mass at the 2 o'clock axis. The biopsy marking clip is in expected position at the site of biopsy. IMPRESSION: Appropriate positioning of the ribbon shaped biopsy marking clip at the site  of biopsy in the upper-outer quadrant of the LEFT breast corresponding to the targeted mass at the 2 o'clock axis. Final Assessment: Post Procedure Mammograms for Marker Placement Electronically Signed   By: Franki Cabot M.D.   On: 04/03/2021 08:32   [place] LT BREAST BX W LOC DEV 1ST LESION IMG BX SPEC US GUIDE  Addendum Date: 04/06/2021   ADDENDUM REPORT: 04/06/2021 12:20 ADDENDUM: Pathology revealed GRADE II INVASIVE MAMMARY CARCINOMA of the Left breast, 2 o'clock. This was found to be concordant by Dr. Franki Cabot. Pathology results were discussed with the patient by telephone. The patient reported doing well after the biopsy with tenderness at the site.  Post biopsy instructions and care were reviewed and questions were answered. The patient was encouraged to call [facility] for any additional concerns. My direct phone number was provided. The patient was referred to [facility] on April 15, 2021. Pathology results reported by Terie Purser, RN on 04/06/2021. Electronically Signed   By: Franki Cabot M.D.   On: 04/06/2021 12:20   Result Date: 04/06/2021 CLINICAL DATA:  Patient with a highly suspicious mass in the LEFT breast presents today for ultrasound-guided core biopsy. EXAM: ULTRASOUND GUIDED LEFT BREAST CORE NEEDLE BIOPSY COMPARISON:  Previous exam(s). PROCEDURE: I met with the patient and we discussed the procedure of ultrasound-guided biopsy, including benefits and alternatives. We discussed the high likelihood of a successful procedure. We discussed the risks of the procedure, including infection, bleeding, tissue injury, clip migration, and inadequate sampling. Informed written consent was given. The usual time-out protocol was performed immediately prior to the procedure. Lesion quadrant: Upper outer quadrant Using sterile technique and 1% Lidocaine as local anesthetic, under direct ultrasound visualization, a 12 gauge spring-loaded device was used to perform biopsy of the LEFT breast mass at the 2 o'clock axis using a lateral approach. At the conclusion of the procedure ribbon shaped tissue marker clip was deployed into the biopsy cavity. Follow up 2 view mammogram was performed and dictated separately. IMPRESSION: Ultrasound guided biopsy of the LEFT breast mass at the 2 o'clock axis. No apparent complications. Electronically Signed: By: Franki Cabot M.D. On: 04/03/2021 08:09    ASSESSMENT & PLAN:  ALLANNA BRESEE is a 50 y.o. African [name] female with    1. Malignant neoplasm of upper-outer quadrant of left breast, Stage IA, c(T1cN0M0),  ER+/PR+/HER2-, Grade II  -We discussed her image findings and the biopsy results in great details. She has two adjacent 1.4cm and 0.5cm mass in her left breast. She did feel this herself. 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  -Given the early stage disease, she is a candidate for Left lumpectomy with SLNB. She is agreeable with that. She was seen by Dr. Donne Hazel today and likely will proceed with surgery soon.  -I recommend a Oncotype Dx test on the surgical sample and we'll make a decision about adjuvant chemotherapy based on the Oncotype result. Written material of this test was given to her. She is young and fit, would be a good candidate for chemotherapy if her Oncotype recurrence score is high. -If her surgical sentinel lymph node positive, I recommend mammaprint for further risk stratification and guide adjuvant chemotherapy. -The risk of recurrence depends on the stage and biology of the tumor. She is early stage, with ER/PR positive and HER2 negative markers. I discussed this is the more common type of  slow growing tumor.  -She was also seen by radiation oncologist Dr. Lisbeth Renshaw today. Adjuvant radiation is recommended after lumpectomy to reduce her risk of local recurrence.   -Given the strong ER and PR expression in her postmenopausal status, I recommend adjuvant endocrine therapy with aromatase inhibitor Anastrozole for a total of 5-10 years to reduce the risk of cancer recurrence. Potential benefits and side effects were discussed with patient and she is interested. -We also discussed the breast cancer surveillance after her surgery. She will continue annual screening mammogram, self exam, and a routine office visit with lab and exam with [place]. -Labs today show, CBC and CMP WNL except Hct 35.5%.  -She will proceed with surgery soon. F/u after surgery or radiation.    2. Genetics  -Her mother was diagnosed with breast cancer less than 1 year ago. She also has  multiple family members with breast cancer. She is eligible for genetic testing. She is interested in will proceed with testing today.     PLAN:  -Proceed with surgery soon  -Oncotype on surgical sample  -F/u after radiation, or sooner if needed    No orders of the defined types were placed in this encounter.   All questions were answered. The patient knows to call the clinic with any problems, questions or concerns. The total time spent in the appointment was 50 minutes.     Truitt Merle, MD 04/15/2021 10:33 AM  I, Joslyn Devon, am acting as scribe for Truitt Merle, MD.   I have reviewed the above documentation for accuracy and completeness, and I agree with the above.

## 2021-04-16 ENCOUNTER — Encounter: Payer: Self-pay | Admitting: Genetic Counselor

## 2021-04-16 DIAGNOSIS — Z803 Family history of malignant neoplasm of breast: Secondary | ICD-10-CM | POA: Insufficient documentation

## 2021-04-16 DIAGNOSIS — Z8042 Family history of malignant neoplasm of prostate: Secondary | ICD-10-CM | POA: Insufficient documentation

## 2021-04-16 DIAGNOSIS — Z800 Family history of malignant neoplasm of digestive organs: Secondary | ICD-10-CM | POA: Insufficient documentation

## 2021-04-16 DIAGNOSIS — Z808 Family history of malignant neoplasm of other organs or systems: Secondary | ICD-10-CM | POA: Insufficient documentation

## 2021-04-16 NOTE — Progress Notes (Signed)
REFERRING PROVIDER: Truitt Merle, MD [place],  [place] [address]  PRIMARY PROVIDER:  Griffin Basil, MD  PRIMARY REASON FOR VISIT:  1. Malignant neoplasm of upper-outer quadrant of left breast in female, estrogen receptor positive ([place])   2. Family history of breast cancer   3. Family history of bone cancer   4. Family history of prostate cancer   5. Family history of throat cancer       HISTORY OF PRESENT ILLNESS:   Sheila [name], a 50 y.o. female, was seen for a [facility] cancer genetics consultation at the request of Dr. Burr Medico due to a personal and family history of cancer.  Sheila [name] presents to clinic today to discuss the possibility of a hereditary predisposition to cancer, genetic testing, and to further clarify her future cancer risks, as well as potential cancer risks for family members.   In May of 2022, at the age of 59, Sheila [name] was diagnosed with invasive ductal carcinoma of the left breast. The treatment plan includes surgery, oncotype, radiation therapy, and antiestrogen therapy.    CANCER HISTORY:  Oncology History Overview Note  Cancer Staging Malignant neoplasm of upper-outer quadrant of left breast in female, estrogen receptor positive ([place]) Staging form: Breast, AJCC 8th Edition - Clinical stage from 04/03/2021: Stage IA (cT1c, cN0, cM0, G2, ER+, PR+, HER2-) - Signed by Truitt Merle, MD on 04/14/2021 Stage prefix: Initial diagnosis Histologic grading system: 3 grade system    Malignant neoplasm of upper-outer quadrant of left breast in female, estrogen receptor positive ([place])  03/31/2021 Mammogram   Targeted ultrasound is performed, demonstrating an irregular hypoechoic mass with angular margins at the 2 o'clock position approximately 5 cm nipple measuring approximately 1.4 x 0.8 x 1.0 cm, demonstrating posterior acoustic shadowing, associated with microcalcifications, corresponding to the screening mammographic finding.   Immediately  adjacent to the dominant mass is a hypoechoic mass measuring 0.5 x 0.4 x 0.4 cm, demonstrating posterior acoustic enhancement and no internal power Doppler flow.   Sonographic evaluation of the LEFT axilla demonstrates no pathologic lymphadenopathy.   04/03/2021 Cancer Staging   Staging form: Breast, AJCC 8th Edition - Clinical stage from 04/03/2021: Stage IA (cT1c, cN0, cM0, G2, ER+, PR+, HER2-) - Signed by Truitt Merle, MD on 04/14/2021 Stage prefix: Initial diagnosis Histologic grading system: 3 grade system   04/03/2021 Initial Biopsy   Diagnosis Breast, left, needle core biopsy, 2 o'clock - INVASIVE MAMMARY CARCINOMA - SEE COMMENT Microscopic Comment The biopsy material shows an infiltrative proliferation of cells with large vesicular nuclei with inconspicuous nucleoli, arranged linearly and in small clusters. Based on the biopsy, the carcinoma appears Nottingham grade 2 of 3 and measures 1.0 cm in greatest linear extent. E-cadherin and prognostic markers (ER/PR/ki-67/HER2)are pending and will be reported in an addendum. Dr. Jeannie Done reviewed the case and agrees with the above diagnosis. These results were called to [facility] on Apr 06, 2021.  E-cadherin is POSITIVE supporting a ductal origin.   04/03/2021 Receptors her2   PROGNOSTIC INDICATORS Results: IMMUNOHISTOCHEMICAL AND MORPHOMETRIC ANALYSIS PERFORMED MANUALLY The tumor cells are NEGATIVE for Her2 (1+). Estrogen Receptor: 70%, POSITIVE, STRONG STAINING INTENSITY Progesterone Receptor: >95%, POSITIVE, STRONG STAINING INTENSITY Proliferation Marker Ki67: 15%   04/05/2021 Imaging   CT AP  IMPRESSION: 1. No acute abnormality of the abdomen or pelvis. 2. Unchanged appearance of inferior right hepatic lobe lesion containing numerous calcifications.     04/08/2021 Initial Diagnosis   Malignant neoplasm of upper-outer  quadrant of left breast in female, estrogen receptor positive ([place])      RISK  FACTORS:  Menarche was at age [age].  First live birth at age 89.  Ovaries intact: yes.  Hysterectomy: no.  Menopausal status: postmenopausal.  HRT use: 0 years. Colonoscopy: no; not examined. Mammogram within the last year: yes. Up to date with pelvic exams: yes.   Past Medical History:  Diagnosis Date  . Anemia   . Breast cancer ([place])   . Family history of bone cancer   . Family history of breast cancer   . Family history of prostate cancer   . Family history of throat cancer   . Malignant neoplasm of upper-outer quadrant of left breast in female, estrogen receptor positive ([place]) 04/08/2021    Past Surgical History:  Procedure Laterality Date  . LEFT HEART CATHETERIZATION WITH CORONARY ANGIOGRAM N/A 05/30/2014   Procedure: LEFT HEART CATHETERIZATION WITH CORONARY ANGIOGRAM;  Surgeon: Peter M Martinique, MD;  Location[facility] C[facility] LAB;  Service: Cardiovascular;  Laterality: N/A;  . TUBAL LIGATION      Social History   Socioeconomic History  . Marital status: Married    Spouse name: Not on file  . Number of children: 4  . Years of education: Not on file  . Highest education level: Not on file  Occupational History  . Occupation: Medical laboratory scientific officer: [employer]  Tobacco Use  . Smoking status: Never Smoker  . Smokeless tobacco: Never Used  Substance and Sexual Activity  . Alcohol use: No  . Drug use: No  . Sexual activity: Yes    Birth control/protection: Surgical  Other Topics Concern  . Not on file  Social History Narrative  . Not on file   Social Determinants of Health   Financial Resource Strain: Not on file  Food Insecurity: No Food Insecurity  . Worried About Charity fundraiser in the Last Year: Never true  . Ran Out of Food in the Last Year: Never true  Transportation Needs: No Transportation Needs  . Lack of Transportation (Medical): No  . Lack of Transportation (Non-Medical): No  Physical Activity: Not on file  Stress: Not on file  Social  Connections: Not on file     FAMILY HISTORY:  We obtained a detailed, 4-generation family history.  Significant diagnoses are listed below: Family History  Problem Relation Age of Onset  . Hypertension Mother   . Heart Problems Mother   . Hyperlipidemia Mother   . Breast cancer Mother 5  . Diabetes Father   . Seizures Father   . Hypertension Maternal Aunt   . Hypertension Maternal Uncle   . Hypertension Maternal Grandmother   . Stroke Maternal Grandmother   . Breast cancer Maternal Grandmother 86  . Stroke Maternal Grandfather   . Breast cancer Cousin        dx 65s, bilateral mastectomies (maternal first cousin)  . Cancer Paternal Aunt        unknown type  . Cancer Paternal Uncle        unknown type  . Breast cancer Other        great-aunt (MGF's sister)  . Prostate cancer Maternal Uncle   . Prostate cancer Maternal Uncle   . Cancer Paternal Uncle        unknown type  . Throat cancer Paternal Uncle   . Bone cancer Nephew 11       knee, great-nephew  . Breast cancer Other  first cousin once removed (MGF's niece)  . Breast cancer Other        first cousin once removed (MGF's niece)  . Cancer Other        unknown type, great-uncle (MGM's brother)  . Breast cancer Other        first cousin once removed (MGM's niece)  . Breast cancer Other        first cousin once removed (MGM's niece)  . Prostate cancer Other        first cousin once removed (MGM's nephew)  . Breast cancer Other        second cousin (MGM's great-niece)  . Cancer Other        unknown type, dx 53s (maternal aunt's granddaughter)  . Sickle cell anemia Other    Sheila [name] has three daughters (ages 15-29) and one son (age 32). She has two brothers and two sisters. None of these relatives have had cancer. There is a great-nephew (sister's grandson) who was diagnosed with bone cancer of the knee at age 52.  Sheila [name] mother is alive at age 85 with a history of breast cancer diagnosed at age 44.  There were two maternal aunts and four maternal uncles. Two uncles had prostate cancer. One maternal first cousin was diagnosed with breast cancer in her 60s and had bilateral mastectomies. A first cousin once-removed (maternal aunt's granddaughter) died in her 21s with an unknown cancer. Sheila [name] maternal grandmother is alive at age 41 with a history of breast cancer (diagnosed age 72). Her maternal grandfather died at age 42 without cancer.   There were also multiple maternal great-aunts/uncles and distant cousins with cancers. A maternal great-aunt (MGF's sister) had breast cancer. Two of her MGF's nieces had breast cancer. A maternal great-uncle (MGM's brother) had an unknown cancer. Two of her MGM's nieces had breast cancer. One of her MGM's nephews had prostate cancer. A maternal second cousin had breast cancer diagnosed younger than 40.  Sheila [name] father died at age 42 without cancer. There were 11 paternal aunts/uncles. One aunt and two uncles had unknown cancers, and another uncle may have had throat cancer. Sheila [name] paternal grandparents died older than 34 without cancer.   Sheila [name] is unaware of previous family history of genetic testing for hereditary cancer risks. Patient's maternal ancestors are of mixed descent, and paternal ancestors are of mixed descent. There is no reported Ashkenazi Jewish ancestry. There is no known consanguinity.  GENETIC COUNSELING ASSESSMENT: Sheila [name] is a 50 y.o. female with a personal history of breast cancer and a family history of breast cancer, prostate cancer, bone cancer, throat cancer, and unknown cancers, which is somewhat suggestive of a hereditary cancer syndrome and predisposition to cancer. We, therefore, discussed and recommended the following at today's visit.   DISCUSSION: We discussed that approximately 5-10% of breast cancer is hereditary, with most cases associated with the BRCA1 and BRCA2 genes. There are other genes that can  be associated with hereditary breast cancer syndromes. These include ATM, CHEK2, PALB2, etc. We discussed that testing is beneficial for several reasons, including knowing about other cancer risks, identifying potential screening and risk-reduction options that may be appropriate, and to understand if other family members could be at risk for cancer and allow them to undergo genetic testing.  We reviewed the characteristics, features and inheritance patterns of hereditary cancer syndromes. We also discussed genetic testing, including the appropriate family members to test, the process of testing, insurance coverage and turn-around-time  for results. We discussed the implications of a negative, positive and/or variant of uncertain significant result. In order to get genetic test results in a timely manner so that Sheila [name] can use these genetic test results for surgical decisions, we recommended Sheila [name] pursue genetic testing for the [employer]. Once complete, we recommend Sheila [name] pursue reflex genetic testing to the CancerNext-Expanded + RNAinsight panel.   The BRCAplus panel offered by [employer] and includes sequencing and deletion/duplication analysis for the following 8 genes: ATM, BRCA1, BRCA2, CDH1, CHEK2, PALB2, PTEN, and TP53. The CancerNext-Expanded + RNAinsight gene panel offered by [employer] and includes sequencing and rearrangement analysis for the following 77 genes: AIP, ALK, APC, ATM, AXIN2, BAP1, BARD1, BLM, BMPR1A, BRCA1, BRCA2, BRIP1, CDC73, CDH1, CDK4, CDKN1B, CDKN2A, CHEK2, CTNNA1, DICER1, FANCC, FH, FLCN, GALNT12, KIF1B, LZTR1, MAX, MEN1, MET, MLH1, MSH2, MSH3, MSH6, MUTYH, NBN, NF1, NF2, NTHL1, PALB2, PHOX2B, PMS2, POT1, PRKAR1A, PTCH1, PTEN, RAD51C, RAD51D, RB1, RECQL, RET, SDHA, SDHAF2, SDHB, SDHC, SDHD, SMAD4, SMARCA4, SMARCB1, SMARCE1, STK11, SUFU, TMEM127, TP53, TSC1, TSC2, VHL and XRCC2 (sequencing and deletion/duplication); EGFR, EGLN1, HOXB13, KIT, MITF,  PDGFRA, POLD1 and POLE (sequencing only); EPCAM and GREM1 (deletion/duplication only). RNA data is routinely analyzed for use in variant interpretation for all genes.  Based on Sheila [name]'s personal and family history of cancer, she meets medical criteria for genetic testing. Despite that she meets criteria, she may still have an out of pocket cost.   PLAN: After considering the risks, benefits, and limitations, Sheila [name] provided informed consent to pursue genetic testing and the blood sample was sent to [facility] for analysis of the BRCAplus panel and CancerNext-Expanded + RNAinsight panel. Results should be available within approximately one-two weeks' time, at which point they will be disclosed by telephone to Sheila [name], as will any additional recommendations warranted by these results. Sheila [name] will receive a summary of her genetic counseling visit and a copy of her results once available. This information will also be available in Epic.   Sheila [name] questions were answered to her satisfaction today. Our contact information was provided should additional questions or concerns arise. Thank you for the referral and allowing [place] to share in the care of your patient.   Clint Guy, [place], [facility] Licensed, Certified Dispensing [email or web address] Phone: [phone]  The patient was seen for a total of 20 minutes in face-to-face genetic counseling. Patient was seen with her mother. This patient was discussed with Drs. Magrinat, Lindi Adie and/or Burr Medico who agrees with the above.    _______________________________________________________________________ For Office Staff:  Number of people involved in session: 1 Was an Intern/ student involved with case: no

## 2021-04-17 ENCOUNTER — Encounter: Payer: Self-pay | Admitting: General Practice

## 2021-04-17 NOTE — Progress Notes (Signed)
[place] Spiritual Care Note  Referred by Sharyn Lull Zavala/LCSW for [facility] follow-up spiritual and emotional support. Sheila [name] was welcoming of call, pleased to be reminded of free support programming available to her and a support person, and requests post-op pastoral call. She also knows to reach out in the meantime as needed/desired. Will phone after her surgery.   Anna, [place], [facility] Pager [phone] Voicemail [phone]

## 2021-04-21 DIAGNOSIS — Z1379 Encounter for other screening for genetic and chromosomal anomalies: Secondary | ICD-10-CM | POA: Insufficient documentation

## 2021-04-21 NOTE — Pre-Procedure Instructions (Signed)
Surgical Instructions    Your procedure is scheduled on Wednesday 04/29/21.  Report to [facility] Main Entrance "A" at 10:30 A.M., then check in with the Admitting office.  Call this number if you have problems the morning of surgery:  [phone]   If you have any questions prior to your surgery date call [phone]: Open Monday-Friday 8am-4pm    Remember:  Do not eat after midnight the night before your surgery  You may drink clear liquids until 09:30 a.m. the morning of your surgery.   Clear liquids allowed are: Water, Non-Citrus Juices (without pulp), Carbonated Beverages, Clear Tea, Black Coffee Only, and Gatorade Patient Instructions  . The night before surgery:  o No food after midnight. ONLY clear liquids after midnight  . The day of surgery (if you do NOT have diabetes):  o Drink ONE (1) Pre-Surgery Clear Ensure by 09:30 a.m. the morning of surgery. Drink in one sitting. Do not sip.  o This drink was given to you during your hospital  pre-op appointment visit.  o Nothing else to drink after completing the  Pre-Surgery Clear Ensure.         If you have questions, please contact your surgeon's office.     Take these medicines the morning of surgery with A SIP OF WATER   Per your medication list you are not currently taking any medications.     As of today, STOP taking any Aspirin (unless otherwise instructed by your surgeon) Aleve, Naproxen, Ibuprofen, Motrin, Advil, Goody's, BC's, all herbal medications, fish oil, and all vitamins.          Do not wear jewelry or makeup Do not wear lotions, powders, perfumes, or deodorant. Do not shave 48 hours prior to surgery.   Do not bring valuables to the hospital. DO Not wear nail polish, gel polish, artificial nails, or any other type of covering on natural nails including finger and toenails. If patients have artificial nails, gel coating, etc. that need to be removed by a nail salon please have this removed prior to  surgery or surgery may need to be canceled/delayed if the surgeon/ anesthesia feels like the patient is unable to be adequately monitored.             [facility] is not responsible for any belongings or valuables.  Do NOT Smoke (Tobacco/Vaping) or drink Alcohol 24 hours prior to your procedure If you use a CPAP at night, you may bring all equipment for your overnight stay.   Contacts, glasses, dentures or partials may not be worn into surgery, please bring cases for these belongings   For patients admitted to the hospital, discharge time will be determined by your treatment team.   Patients discharged the day of surgery will not be allowed to drive home, and someone needs to stay with them for 24 hours.    Special instructions:    Oral Hygiene is also important to reduce your risk of infection.  Remember - BRUSH YOUR TEETH THE MORNING OF SURGERY WITH YOUR REGULAR TOOTHPASTE   [place]- Preparing For Surgery  Before surgery, you can play an important role. Because skin is not sterile, your skin needs to be as free of germs as possible. You can reduce the number of germs on your skin by washing with CHG (chlorahexidine gluconate) Soap before surgery.  CHG is an antiseptic cleaner which kills germs and bonds with the skin to continue killing germs even after washing.     Please  do not use if you have an allergy to CHG or antibacterial soaps. If your skin becomes reddened/irritated stop using the CHG.  Do not shave (including legs and underarms) for at least 48 hours prior to first CHG shower. It is OK to shave your face.  Please follow these instructions carefully.    1.  Shower the NIGHT BEFORE SURGERY and the MORNING OF SURGERY with CHG Soap.   If you chose to wash your hair, wash your hair first as usual with your normal shampoo. After you shampoo, rinse your hair and body thoroughly to remove the shampoo.  Then [employer] and genitals (private parts) with your normal soap and  rinse thoroughly to remove soap.  2. After that Use CHG Soap as you would any other liquid soap. You can apply CHG directly to the skin and wash gently with a scrungie or a clean washcloth.   3. Apply the CHG Soap to your body ONLY FROM THE NECK DOWN.  Do not use on open wounds or open sores. Avoid contact with your eyes, ears, mouth and genitals (private parts). Wash Face and genitals (private parts)  with your normal soap.   4. Wash thoroughly, paying special attention to the area where your surgery will be performed.  5. Thoroughly rinse your body with warm water from the neck down.  6. DO NOT shower/wash with your normal soap after using and rinsing off the CHG Soap.  7. Pat yourself dry with a CLEAN TOWEL.  8. Wear CLEAN PAJAMAS to bed the night before surgery  9. Place CLEAN SHEETS on your bed the night before your surgery  10. DO NOT SLEEP WITH PETS.   Day of Surgery:  Take a shower with CHG soap. Wear Clean/Comfortable clothing the morning of surgery Do not apply any deodorants/lotions.   Remember to brush your teeth WITH YOUR REGULAR TOOTHPASTE.   Please read over the following fact sheets that you were given.

## 2021-04-22 ENCOUNTER — Encounter (HOSPITAL_COMMUNITY): Payer: Self-pay

## 2021-04-22 ENCOUNTER — Encounter (HOSPITAL_COMMUNITY)
Admission: RE | Admit: 2021-04-22 | Discharge: 2021-04-22 | Disposition: A | Discharge status: Home / Self Care | Payer: Medicaid Other | Source: Ambulatory Visit | Attending: General Surgery | Admitting: General Surgery

## 2021-04-22 ENCOUNTER — Telehealth: Payer: Self-pay | Admitting: Genetic Counselor

## 2021-04-22 ENCOUNTER — Encounter: Payer: Self-pay | Admitting: *Deleted

## 2021-04-22 ENCOUNTER — Telehealth: Payer: Self-pay | Admitting: *Deleted

## 2021-04-22 ENCOUNTER — Other Ambulatory Visit: Payer: Self-pay

## 2021-04-22 HISTORY — DX: Angina pectoris, unspecified: I20.9

## 2021-04-22 NOTE — Telephone Encounter (Signed)
Spoke to pt concerning [place] from 6.1.22. Denies questions or concerns regarding dx or treatment care plan. Encourage pt to call with needs. Received verbal understanding.

## 2021-04-22 NOTE — Progress Notes (Signed)
PCP - Lynnda Shields MD Cardiologist - Cherlynn Kaiser  PPM/ICD -denies  Device Orders -  Rep Notified -   Chest x-ray -12/09/20  EKG -  10/17/20 Stress Test -denies ECHO - 05/15/20 Cardiac Cath -   Sleep Study - none  CPAP -   Fasting Blood Sugar - n/a Checks Blood Sugar _____ times a day  Blood Thinner Instructions:n/a Aspirin Instructions:n/a  ERAS Protcol - clear liquids until 0930 PRE-SURGERY Ensure or G2- drink Ensure between 0830 and 0930. Nothing after 0930.  COVID TEST- N/A   Anesthesia review:yes -patient scheduled for seed placement.   Patient denies shortness of breath, fever, cough and chest pain at PAT appointment   All instructions explained to the patient, with a verbal understanding of the material. Patient agrees to go over the instructions while at home for a better understanding. Patient also instructed to self quarantine after being tested for COVID-19. The opportunity to ask questions was provided.

## 2021-04-22 NOTE — Telephone Encounter (Signed)
LVM that her genetic test results are available and requested that she call back to discuss them.  

## 2021-04-23 ENCOUNTER — Encounter: Payer: Self-pay | Admitting: Genetic Counselor

## 2021-04-23 NOTE — Telephone Encounter (Signed)
Returned patient phone call to discuss genetic test results. LVM requesting that she call back.

## 2021-04-23 NOTE — Telephone Encounter (Signed)
Revealed negative genetic testing through the [facility] panel. Discussed that we do not know why she has breast cancer or why there is cancer in the family. There could be a genetic mutation in the family that Sheila [name] did not inherit. There could also be a mutation in a different gene that we are not testing, or our current technology may not be able to detect certain mutations. It will therefore be important for her to stay in contact with genetics to keep up with whether additional testing may be appropriate in the future.   Additional testing through the [facility] CancerNext-Expanded + RNAinsight panel is pending. We will contact Sheila [name] once these results are available.

## 2021-04-23 NOTE — Anesthesia Preprocedure Evaluation (Addendum)
Anesthesia Evaluation  Patient identified by MRN, date of birth, ID band Patient awake    Reviewed: Allergy & Precautions, NPO status , Patient's Chart, lab work & pertinent test results  Airway Mallampati: II  TM Distance: >3 FB Neck ROM: Full    Dental no notable dental hx. (+) Teeth Intact, Dental Advisory Given   Pulmonary neg pulmonary ROS,    Pulmonary exam normal breath sounds clear to auscultation       Cardiovascular Normal cardiovascular exam Rhythm:Regular Rate:Normal  Coronary CTA 05/26/20: IMPRESSION: 1. Minimal CAD, CADRADS = 1.  2. Coronary calcium score of 0.  3. Normal coronary origin with right dominance.  TTE 05/15/20: 1. Left ventricular ejection fraction, by estimation, is 65 to 70%. The  left ventricle has normal function. The left ventricle has no regional  wall motion abnormalities. There is mild concentric left ventricular  hypertrophy. Left ventricular diastolic  parameters were normal.  2. Right ventricular systolic function is normal. The right ventricular  size is normal. There is normal pulmonary artery systolic pressure. The  estimated right ventricular systolic pressure is 16.1 mmHg.  3. The mitral valve is normal in structure. No evidence of mitral valve  regurgitation. No evidence of mitral stenosis.  4. Tricuspid valve regurgitation is mild to moderate.  5. The aortic valve is normal in structure. Aortic valve regurgitation is  not visualized. No aortic stenosis is present.  6. The inferior vena cava is normal in size with greater than 50%  respiratory variability, suggesting right atrial pressure of 3 mmHg   Neuro/Psych PSYCHIATRIC DISORDERS Anxiety negative neurological ROS     GI/Hepatic negative GI ROS, Neg liver ROS,   Endo/Other  negative endocrine ROS  Renal/GU negative Renal ROS  negative genitourinary   Musculoskeletal negative musculoskeletal ROS (+)    Abdominal   Peds  Hematology negative hematology ROS (+)   Anesthesia Other Findings   Reproductive/Obstetrics                            Anesthesia Physical Anesthesia Plan  ASA: 2  Anesthesia Plan: General and Regional   Post-op Pain Management:  Regional for Post-op pain   Induction: Intravenous  PONV Risk Score and Plan: 3 and Ondansetron, Dexamethasone and Midazolam  Airway Management Planned: LMA  Additional Equipment:   Intra-op Plan:   Post-operative Plan: Extubation in OR  Informed Consent: I have reviewed the patients History and Physical, chart, labs and discussed the procedure including the risks, benefits and alternatives for the proposed anesthesia with the patient or authorized representative who has indicated his/her understanding and acceptance.     Dental advisory given  Plan Discussed with: CRNA  Anesthesia Plan Comments: (PAT note by Karoline Caldwell, [name]: Hx of noncardiac chest pain. Had workup with Dr. Margaretann Loveless. Echo was normal but showed possible LVH, which was less evident on CCTA, wall thickness measured normal. Minimal CAD. Last seen 06/30/20. Per note, "Chest pain - discussed reassuring test results, as well as testing from 2015. Chest pressure is likely extracardiac."  Proep labs reviewed, unremarkable.   EKG 11/05/20: NSR. Rate 90. Nonspecific T wave abnormality.  CHEST - 2 VIEW 12/09/20: COMPARISON: Chest radiograph dated 11/05/2020.  FINDINGS: The heart size and mediastinal contours are within normal limits. Both lungs are clear. The visualized skeletal structures are unremarkable.  IMPRESSION: No active cardiopulmonary disease.  Coronary CTA 05/26/20: IMPRESSION: 1. Minimal CAD, CADRADS = 1.  2. Coronary calcium score of  0.  3. Normal coronary origin with right dominance.  TTE 05/15/20: 1. Left ventricular ejection fraction, by estimation, is 65 to 70%. The  left ventricle has normal function. The  left ventricle has no regional  wall motion abnormalities. There is mild concentric left ventricular  hypertrophy. Left ventricular diastolic  parameters were normal.  2. Right ventricular systolic function is normal. The right ventricular  size is normal. There is normal pulmonary artery systolic pressure. The  estimated right ventricular systolic pressure is 43.7 mmHg.  3. The mitral valve is normal in structure. No evidence of mitral valve  regurgitation. No evidence of mitral stenosis.  4. Tricuspid valve regurgitation is mild to moderate.  5. The aortic valve is normal in structure. Aortic valve regurgitation is  not visualized. No aortic stenosis is present.  6. The inferior vena cava is normal in size with greater than 50%  respiratory variability, suggesting right atrial pressure of 3 mmHg.  )       Anesthesia Quick Evaluation

## 2021-04-23 NOTE — Progress Notes (Signed)
Anesthesia Chart Review:  Hx of noncardiac chest pain. Had workup with Dr. Margaretann Loveless. Echo was normal but showed possible LVH, which was less evident on CCTA, wall thickness measured normal. Minimal CAD. Last seen 06/30/20. Per note, "Chest pain - discussed reassuring test results, as well as testing from 2015. Chest pressure is likely extracardiac."  Proep labs reviewed, unremarkable.   EKG 11/05/20: NSR. Rate 90. Nonspecific T wave abnormality.  CHEST - 2 VIEW 12/09/20: COMPARISON:  Chest radiograph dated 11/05/2020.   FINDINGS: The heart size and mediastinal contours are within normal limits. Both lungs are clear. The visualized skeletal structures are unremarkable.   IMPRESSION: No active cardiopulmonary disease.  Coronary CTA 05/26/20: IMPRESSION: 1. Minimal CAD, CADRADS = 1.   2. Coronary calcium score of 0.   3. Normal coronary origin with right dominance.  TTE 05/15/20:  1. Left ventricular ejection fraction, by estimation, is 65 to 70%. The  left ventricle has normal function. The left ventricle has no regional  wall motion abnormalities. There is mild concentric left ventricular  hypertrophy. Left ventricular diastolic  parameters were normal.   2. Right ventricular systolic function is normal. The right ventricular  size is normal. There is normal pulmonary artery systolic pressure. The  estimated right ventricular systolic pressure is 28.6 mmHg.   3. The mitral valve is normal in structure. No evidence of mitral valve  regurgitation. No evidence of mitral stenosis.   4. Tricuspid valve regurgitation is mild to moderate.   5. The aortic valve is normal in structure. Aortic valve regurgitation is  not visualized. No aortic stenosis is present.   6. The inferior vena cava is normal in size with greater than 50%  respiratory variability, suggesting right atrial pressure of 3 mmHg.   Wynonia Musty [facility]/Anesthesiology Phone [phone] 04/23/2021  2:10 PM

## 2021-04-24 ENCOUNTER — Ambulatory Visit (INDEPENDENT_AMBULATORY_CARE_PROVIDER_SITE_OTHER): Payer: Medicaid Other | Admitting: Internal Medicine

## 2021-04-24 ENCOUNTER — Encounter: Payer: Self-pay | Admitting: Internal Medicine

## 2021-04-24 ENCOUNTER — Other Ambulatory Visit: Payer: Self-pay

## 2021-04-24 VITALS — BP 156/98 | HR 76 | Ht 69.0 in | Wt 212.4 lb

## 2021-04-24 DIAGNOSIS — Z01810 Encounter for preprocedural cardiovascular examination: Secondary | ICD-10-CM

## 2021-04-24 DIAGNOSIS — R079 Chest pain, unspecified: Secondary | ICD-10-CM | POA: Diagnosis not present

## 2021-04-24 DIAGNOSIS — R9431 Abnormal electrocardiogram [ECG] [EKG]: Secondary | ICD-10-CM | POA: Diagnosis not present

## 2021-04-24 DIAGNOSIS — Z170 Estrogen receptor positive status [ER+]: Secondary | ICD-10-CM

## 2021-04-24 DIAGNOSIS — C50412 Malignant neoplasm of upper-outer quadrant of left female breast: Secondary | ICD-10-CM

## 2021-04-24 NOTE — Progress Notes (Signed)
Cardiology Office Note:    Date:  04/24/2021   ID:  Sheila [name], DOB Sep 18, 1971, MRN [id number]  PCP:  Griffin Basil, MD  Cardiologist:  None  Electrophysiologist:  None   Referring MD: Griffin Basil, MD   Chief Complaint/Reason for Referral: Preoperative cardiovascular exam  History of Present Illness:    Sheila [name] is a 50 y.o. female with a history of history of anemia and impaired fasting glucose who presents for follow up. She has been diagnosed with a malignant neoplasm of upper-outer quadrant of left breast, estrogen receptor positive in late May 2022 and is anticipating on June 15th - left lumpectomy with SLNB with Dr. Donne Hazel. She presents for preoperative evaluation.  Preoperative cardiovascular evaluation Pertinent past cardiac history: evaluation for chest pain showed minimal CAD on CCTA Prior cardiac workup: CCTA and echo History of valve disease: none History of CAD/PAD/CVA/TIA: none History of heart failure: none History of arrhythmia: none On anticoagulation: no  Additional history (hypertension, diabetes/on insulin, CKD/current creatinine, OSA, anesthesia complications): none Current symptoms: occasional chest discomfort, feels related to stress Functional capacity: >4 METS   Past Medical History:  Diagnosis Date   Anemia    Anginal pain (HCC)    Breast cancer (HCC)    Family history of bone cancer    Family history of breast cancer    Family history of prostate cancer    Family history of throat cancer    Malignant neoplasm of upper-outer quadrant of left breast in female, estrogen receptor positive ([place]) 04/08/2021    Past Surgical History:  Procedure Laterality Date   CARDIAC CATHETERIZATION     LEFT HEART CATHETERIZATION WITH CORONARY ANGIOGRAM N/A 05/30/2014   Procedure: LEFT HEART CATHETERIZATION WITH CORONARY ANGIOGRAM;  Surgeon: Peter M Martinique, MD;  [facility] CATH LAB;  Service: Cardiovascular;  Laterality: N/A;   TUBAL  LIGATION      Current Medications: No outpatient medications have been marked as taking for the 04/24/21 encounter (Office Visit) with Elouise [name], MD.     Allergies:   Patient has no known allergies.   Social History   Tobacco Use   Smoking status: Never   Smokeless tobacco: Never  Substance Use Topics   Alcohol use: No   Drug use: No     Family History: The patient's family history includes Bone cancer (age of onset: 2) in her nephew; Breast cancer in her cousin and other family members; Breast cancer (age of onset: 60) in her mother; Breast cancer (age of onset: 41) in her maternal grandmother; Cancer in her paternal aunt, paternal uncle, paternal uncle, and other family members; Diabetes in her father; Heart Problems in her mother; Hyperlipidemia in her mother; Hypertension in her maternal aunt, maternal grandmother, maternal uncle, and mother; Prostate cancer in her maternal uncle, maternal uncle and another family member; Seizures in her father; Sickle cell anemia in an other family member; Stroke in her maternal grandfather and maternal grandmother; Throat cancer in her paternal uncle.  ROS:   Please see the history of present illness.    All other systems reviewed and are negative.  EKGs/Labs/Other Studies Reviewed:    The following studies were reviewed today:  EKG:  NSR, T wave abnl laterally, no change from prior.  Recent Labs: 04/15/2021: ALT 14; BUN 12; Creatinine 0.95; Hemoglobin 12.2; Platelet Count 276; Potassium 4.0; Sodium 142  Recent Lipid Panel    Component Value Date/Time   CHOL 213 (H) 06/30/2020 1553  TRIG 93 06/30/2020 1553   HDL 55 06/30/2020 1553   CHOLHDL 3.9 06/30/2020 1553   CHOLHDL 3.4 05/31/2014 0433   VLDL 13 05/31/2014 0433   LDLCALC 141 (H) 06/30/2020 1553    Physical Exam:    VS:  BP (!) 156/98   Pulse 76   Ht 5\' 9"  (1.753 m)   Wt 212 lb 6.4 oz (96.3 kg)   LMP 04/03/2018 (Within Days)   SpO2 100%   BMI 31.37 kg/m     Wt  Readings from Last 5 Encounters:  04/24/21 212 lb 6.4 oz (96.3 kg)  04/22/21 212 lb 1.6 oz (96.2 kg)  04/15/21 211 lb 9.6 oz (96 kg)  02/25/21 215 lb 6.4 oz (97.7 kg)  12/09/20 182 lb (82.6 kg)    Constitutional: No acute distress Eyes: sclera non-icteric, normal conjunctiva and lids ENMT: normal dentition, moist mucous membranes Chest: large horizontally oriented keloid scar across precordium Cardiovascular: regular rhythm, normal rate, no murmurs. S1 and S2 normal. Radial pulses normal bilaterally. No jugular venous distention.  Respiratory: clear to auscultation bilaterally GI : normal bowel sounds, soft and nontender. No distention.   MSK: extremities warm, well perfused. No edema.  NEURO: grossly nonfocal exam, moves all extremities. PSYCH: alert and oriented x 3, normal mood and affect.   ASSESSMENT:    1. Preoperative cardiovascular examination   2. Abnormal EKG   3. Chest pain at rest   4. Malignant neoplasm of upper-outer quadrant of left breast in female, estrogen receptor positive ([place])    PLAN:    The patient is low risk for intermediate risk procedure.  No further cardiovascular testing is required prior to the procedure.  If this level of risk is acceptable to the patient and surgical team, the patient should be considered optimized from a cardiovascular standpoint. Patient understands risk level and is willing to proceed.  Borderline elevation of BP, which is new. We will address further postoperatively given close timing of surgery.    Total time of encounter: 25 minutes total time of encounter, including 18 minutes spent in face-to-face patient care on the date of this encounter. This time includes coordination of care and counseling regarding above mentioned problem list. Remainder of non-face-to-face time involved reviewing chart documents/testing relevant to the patient encounter and documentation in the medical record. I have independently reviewed documentation  from referring provider.   Cherlynn Kaiser, MD, [facility]   Medication Adjustments/Labs and Tests Ordered: Current medicines are reviewed at length with the patient today.  Concerns regarding medicines are outlined above.   No orders of the defined types were placed in this encounter.   No orders of the defined types were placed in this encounter.   Patient Instructions  Medication Instructions:  Continue same medications *If you need a refill on your cardiac medications before your next appointment, please call your pharmacy*   Lab Work: None ordered   Testing/Procedures: None ordered   Follow-Up: At [facility], you and your health needs are our priority.  As part of our continuing mission to provide you with exceptional heart care, we have created designated Provider Care Teams.  These Care Teams include your primary Cardiologist (physician) and Advanced Practice Providers (APPs -  Physician Assistants and Nurse Practitioners) who all work together to provide you with the care you need, when you need it.  We recommend signing up for the patient portal called "MyChart".  Sign up information is provided on this After Visit  Summary.  MyChart is used to connect with patients for Virtual Visits (Telemedicine).  Patients are able to view lab/test results, encounter notes, upcoming appointments, etc.  Non-urgent messages can be sent to your provider as well.   To learn more about what you can do with MyChart, go to [email or web address].    Your next appointment: 6 months  Call in August to schedule December appointment    The format for your next appointment: Office   Provider:  Dr.Refugio Mcconico

## 2021-04-24 NOTE — Patient Instructions (Signed)
Medication Instructions:  Continue same medications *If you need a refill on your cardiac medications before your next appointment, please call your pharmacy*   Lab Work: None ordered   Testing/Procedures: None ordered   Follow-Up: At [facility], you and your health needs are our priority.  As part of our continuing mission to provide you with exceptional heart care, we have created designated Provider Care Teams.  These Care Teams include your primary Cardiologist (physician) and Advanced Practice Providers (APPs -  Physician Assistants and Nurse Practitioners) who all work together to provide you with the care you need, when you need it.  We recommend signing up for the patient portal called "MyChart".  Sign up information is provided on this After Visit Summary.  MyChart is used to connect with patients for Virtual Visits (Telemedicine).  Patients are able to view lab/test results, encounter notes, upcoming appointments, etc.  Non-urgent messages can be sent to your provider as well.   To learn more about what you can do with MyChart, go to [email or web address].    Your next appointment: 6 months  Call in August to schedule December appointment    The format for your next appointment: Office   Provider:  Dr.Acharya

## 2021-04-28 ENCOUNTER — Ambulatory Visit: Payer: Medicaid Other | Admitting: Internal Medicine

## 2021-04-28 ENCOUNTER — Telehealth: Payer: Self-pay | Admitting: Genetic Counselor

## 2021-04-28 ENCOUNTER — Other Ambulatory Visit: Payer: Self-pay | Admitting: General Surgery

## 2021-04-28 ENCOUNTER — Ambulatory Visit
Admission: RE | Admit: 2021-04-28 | Discharge: 2021-04-28 | Disposition: A | Discharge status: Home / Self Care | Payer: Medicaid Other | Source: Ambulatory Visit | Attending: General Surgery | Admitting: General Surgery

## 2021-04-28 ENCOUNTER — Ambulatory Visit: Payer: Self-pay | Admitting: Genetic Counselor

## 2021-04-28 DIAGNOSIS — Z170 Estrogen receptor positive status [ER+]: Secondary | ICD-10-CM

## 2021-04-28 DIAGNOSIS — C50412 Malignant neoplasm of upper-outer quadrant of left female breast: Secondary | ICD-10-CM

## 2021-04-28 DIAGNOSIS — Z1379 Encounter for other screening for genetic and chromosomal anomalies: Secondary | ICD-10-CM

## 2021-04-28 NOTE — Telephone Encounter (Signed)
Revealed negative genetic testing through the [facility]Exp[facility] RNA[facility]ight panel. 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 Her result is still considered normal and should not impact medical management at this time.

## 2021-04-28 NOTE — Progress Notes (Signed)
HPI:  Sheila [name] was previously seen in the [facility] due to a personal and family history of cancer and concerns regarding a hereditary predisposition to cancer. Please refer to our prior cancer genetics clinic note for more information regarding our discussion, assessment and recommendations, at the time. Sheila [name] recent genetic test results were disclosed to her, as were recommendations warranted by these results. These results and recommendations are discussed in more detail below.  CANCER HISTORY:  Oncology History Overview Note  Cancer Staging Malignant neoplasm of upper-outer quadrant of left breast in female, estrogen receptor positive ([place]) Staging form: Breast, AJCC 8th Edition - Clinical stage from 04/03/2021: Stage IA (cT1c, cN0, cM0, G2, ER+, PR+, HER2-) - Signed by Truitt Merle, MD on 04/14/2021 Stage prefix: Initial diagnosis Histologic grading system: 3 grade system    Malignant neoplasm of upper-outer quadrant of left breast in female, estrogen receptor positive ([place])  03/31/2021 Mammogram   Targeted ultrasound is performed, demonstrating an irregular hypoechoic mass with angular margins at the 2 o'clock position approximately 5 cm nipple measuring approximately 1.4 x 0.8 x 1.0 cm, demonstrating posterior acoustic shadowing, associated with microcalcifications, corresponding to the screening mammographic finding.   Immediately adjacent to the dominant mass is a hypoechoic mass measuring 0.5 x 0.4 x 0.4 cm, demonstrating posterior acoustic enhancement and no internal power Doppler flow.   Sonographic evaluation of the LEFT axilla demonstrates no pathologic lymphadenopathy.   04/03/2021 Cancer Staging   Staging form: Breast, AJCC 8th Edition - Clinical stage from 04/03/2021: Stage IA (cT1c, cN0, cM0, G2, ER+, PR+, HER2-) - Signed by Truitt Merle, MD on 04/14/2021  Stage prefix: Initial diagnosis  Histologic grading system: 3 grade system     04/03/2021 Initial Biopsy   Diagnosis Breast, left, needle core biopsy, 2 o'clock - INVASIVE MAMMARY CARCINOMA - SEE COMMENT Microscopic Comment The biopsy material shows an infiltrative proliferation of cells with large vesicular nuclei with inconspicuous nucleoli, arranged linearly and in small clusters. Based on the biopsy, the carcinoma appears Nottingham grade 2 of 3 and measures 1.0 cm in greatest linear extent. E-cadherin and prognostic markers (ER/PR/ki-67/HER2)are pending and will be reported in an addendum. Dr. Jeannie Done reviewed the case and agrees with the above diagnosis. These results were called to [facility] on Apr 06, 2021.  E-cadherin is POSITIVE supporting a ductal origin.   04/03/2021 Receptors her2   PROGNOSTIC INDICATORS Results: IMMUNOHISTOCHEMICAL AND MORPHOMETRIC ANALYSIS PERFORMED MANUALLY The tumor cells are NEGATIVE for Her2 (1+). Estrogen Receptor: 70%, POSITIVE, STRONG STAINING INTENSITY Progesterone Receptor: >95%, POSITIVE, STRONG STAINING INTENSITY Proliferation Marker Ki67: 15%   04/05/2021 Imaging   CT AP  IMPRESSION: 1. No acute abnormality of the abdomen or pelvis. 2. Unchanged appearance of inferior right hepatic lobe lesion containing numerous calcifications.     04/08/2021 Initial Diagnosis   Malignant neoplasm of upper-outer quadrant of left breast in female, estrogen receptor positive ([place])    04/21/2021 Genetic Testing   Negative genetic testing:  No pathogenic variants detected on the [facility] BRCAplus panel (report date 04/21/2021) or CancerNext-Expanded + RNAinsight panel (report date 04/27/2021). 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   The BRCAplus panel offered by [employer] and includes sequencing and deletion/duplication analysis for the following 8 genes: ATM, BRCA1, BRCA2, CDH1, CHEK2, PALB2, PTEN, and TP53. The CancerNext-Expanded + RNAinsight gene panel  offered by [facility] and includes sequencing and rearrangement analysis for the following  77 genes: AIP, ALK, APC, ATM, AXIN2, BAP1, BARD1, BLM, BMPR1A, BRCA1, BRCA2, BRIP1, CDC73, CDH1, CDK4, CDKN1B, CDKN2A, CHEK2, CTNNA1, DICER1, FANCC, FH, FLCN, GALNT12, KIF1B, LZTR1, MAX, MEN1, MET, MLH1, MSH2, MSH3, MSH6, MUTYH, NBN, NF1, NF2, NTHL1, PALB2, PHOX2B, PMS2, POT1, PRKAR1A, PTCH1, PTEN, RAD51C, RAD51D, RB1, RECQL, RET, SDHA, SDHAF2, SDHB, SDHC, SDHD, SMAD4, SMARCA4, SMARCB1, SMARCE1, STK11, SUFU, TMEM127, TP53, TSC1, TSC2, VHL and XRCC2 (sequencing and deletion/duplication); EGFR, EGLN1, HOXB13, KIT, MITF, PDGFRA, POLD1 and POLE (sequencing only); EPCAM and GREM1 (deletion/duplication only). RNA data is routinely analyzed for use in variant interpretation for all genes.     FAMILY HISTORY:  We obtained a detailed, 4-generation family history.  Significant diagnoses are listed below: Family History  Problem Relation Age of Onset   Hypertension Mother    Heart Problems Mother    Hyperlipidemia Mother    Breast cancer Mother 24   Diabetes Father    Seizures Father    Hypertension Maternal Aunt    Hypertension Maternal Uncle    Hypertension Maternal Grandmother    Stroke Maternal Grandmother    Breast cancer Maternal Grandmother 52   Stroke Maternal Grandfather    Breast cancer Cousin        dx 58s, bilateral mastectomies (maternal first cousin)   Cancer Paternal Aunt        unknown type   Cancer Paternal Uncle        unknown type   Breast cancer Other        great-aunt (MGF's sister)   Prostate cancer Maternal Uncle    Prostate cancer Maternal Uncle    Cancer Paternal Uncle        unknown type   Throat cancer Paternal Uncle    Bone cancer Nephew 11       knee, great-nephew   Breast cancer Other        first cousin once removed (MGF's niece)   Breast cancer Other        first cousin once removed (MGF's niece)   Cancer Other        unknown type, great-uncle (MGM's brother)    Breast cancer Other        first cousin once removed (MGM's niece)   Breast cancer Other        first cousin once removed (MGM's niece)   Prostate cancer Other        first cousin once removed (MGM's nephew)   Breast cancer Other        second cousin (MGM's great-niece)   Cancer Other        unknown type, dx 68s (maternal aunt's granddaughter)   Sickle cell anemia Other    Sheila [name] has three daughters (ages 36-29) and one son (age 51). She has two brothers and two sisters. None of these relatives have had cancer. There is a great-nephew (sister's grandson) who was diagnosed with bone cancer of the knee at age 58.   Sheila [name] mother is alive at age 31 with a history of breast cancer diagnosed at age 50. There were two maternal aunts and four maternal uncles. Two uncles had prostate cancer. One maternal first cousin was diagnosed with breast cancer in her 35s and had bilateral mastectomies. A first cousin once-removed (maternal aunt's granddaughter) died in her 43s with an unknown cancer. Sheila [name] maternal grandmother is alive at age 81 with a history of breast cancer (diagnosed age 26). Her maternal grandfather died at age 81 without cancer.   There were  also multiple maternal great-aunts/uncles and distant cousins with cancers. A maternal great-aunt (MGF's sister) had breast cancer. Two of her MGF's nieces had breast cancer. A maternal great-uncle (MGM's brother) had an unknown cancer. Two of her MGM's nieces had breast cancer. One of her MGM's nephews had prostate cancer. A maternal second cousin had breast cancer diagnosed younger than 67.   Sheila [name] father died at age 50 without cancer. There were 11 paternal aunts/uncles. One aunt and two uncles had unknown cancers, and another uncle may have had throat cancer. Sheila [name] paternal grandparents died older than 31 without cancer.   Sheila [name] is unaware of previous family history of genetic testing for hereditary cancer  risks. Patient's maternal ancestors are of mixed descent, and paternal ancestors are of mixed descent. There is no reported Ashkenazi Jewish ancestry. There is no known consanguinity.  GENETIC TEST RESULTS: Genetic testing reported out on 04/21/2021 through the [facility] panel, and on 04/27/2021 through the CancerNext-Expanded + RNAinsight panel. No pathogenic variants were detected.   The BRCAplus panel offered by [employer] and includes sequencing and deletion/duplication analysis for the following 8 genes: ATM, BRCA1, BRCA2, CDH1, CHEK2, PALB2, PTEN, and TP53. The CancerNext-Expanded + RNAinsight gene panel offered by [employer] and includes sequencing and rearrangement analysis for the following 77 genes: AIP, ALK, APC, ATM, AXIN2, BAP1, BARD1, BLM, BMPR1A, BRCA1, BRCA2, BRIP1, CDC73, CDH1, CDK4, CDKN1B, CDKN2A, CHEK2, CTNNA1, DICER1, FANCC, FH, FLCN, GALNT12, KIF1B, LZTR1, MAX, MEN1, MET, MLH1, MSH2, MSH3, MSH6, MUTYH, NBN, NF1, NF2, NTHL1, PALB2, PHOX2B, PMS2, POT1, PRKAR1A, PTCH1, PTEN, RAD51C, RAD51D, RB1, RECQL, RET, SDHA, SDHAF2, SDHB, SDHC, SDHD, SMAD4, SMARCA4, SMARCB1, SMARCE1, STK11, SUFU, TMEM127, TP53, TSC1, TSC2, VHL and XRCC2 (sequencing and deletion/duplication); EGFR, EGLN1, HOXB13, KIT, MITF, PDGFRA, POLD1 and POLE (sequencing only); EPCAM and GREM1 (deletion/duplication only). RNA data is routinely analyzed for use in variant interpretation for all genes. The test report will be scanned into EPIC and located under the Molecular Pathology section of the Results Review tab.  A portion of the result report is included below for reference.     We discussed with Sheila [name] that because current genetic testing is not perfect, it is possible there may be a gene mutation in one of these genes that current testing cannot detect, but that chance is small.  We also discussed that there could be another gene that has not yet been discovered, or that we have not yet tested, that is  responsible for the cancer diagnoses in the family. It is also possible there is a hereditary cause for the cancer in the family that Sheila [name] did not inherit and therefore was not identified in her testing.  Therefore, it is important to remain in touch with cancer genetics in the future so that we can continue to offer Sheila [name] the most up to date genetic testing.   Genetic testing did identify a variant of uncertain significance (VUS) in the SDHB gene called  p.S8F (c.23C>T).  At this time, it is unknown if this variant is associated with increased cancer risk or if this is a normal finding, but most variants such as this get reclassified to being inconsequential. It should not be used to make medical management decisions. With time, we suspect the lab will determine the significance of this variant, if any. If we do learn more about it, we will try to contact Sheila [name] to discuss it further. However, it is important to stay in touch with [place]  periodically and keep the address and phone number up to date.  CANCER SCREENING RECOMMENDATIONS: Sheila [name] test result is considered negative (normal). This means that we have not identified a hereditary cause for her personal and family history of cancer at this time. While reassuring, this does not definitively rule out a hereditary predisposition to cancer. It is still possible that there could be genetic mutations that are undetectable by current technology. There could be genetic mutations in genes that have not been tested or identified to increase cancer risk.  Therefore, it is recommended she continue to follow the cancer management and screening guidelines provided by her oncology and primary healthcare provider.   An individual's cancer risk and medical management are not determined by genetic test results alone. Overall cancer risk assessment incorporates additional factors, including personal medical history, family history, and any available  genetic information that may result in a personalized plan for cancer prevention and surveillance.  RECOMMENDATIONS FOR FAMILY MEMBERS:  Individuals in this family might be at some increased risk of developing cancer, over the general population risk, simply due to the family history of cancer.  We recommended women in this family have a yearly mammogram beginning at age 46, or 40 years younger than the earliest onset of cancer, an annual clinical breast exam, and perform monthly breast self-exams. Women in this family should also have a gynecological exam as recommended by their primary provider. All family members should be referred for colonoscopy starting at age 64.  It is also possible there is a hereditary cause for the cancer in Sheila [name]'s family that she did not inherit and therefore was not identified in her.  Based on Sheila [name]'s family history, we recommended her maternal relatives who have had cancer have genetic counseling and testing. Sheila [name] will let us know if we can be of any assistance in coordinating genetic counseling and/or testing for these family members.   FOLLOW-UP: Lastly, we discussed with Sheila [name] that cancer genetics is a rapidly advancing field and it is possible that new genetic tests will be appropriate for her and/or her family members in the future. We encouraged her to remain in contact with cancer genetics on an annual basis so we can update her personal and family histories and let her know of advances in cancer genetics that may benefit this family.   Our contact number was provided. Sheila [name] questions were answered to her satisfaction, and she knows she is welcome to call us at anytime with additional questions or concerns.   Clint Guy, MS, [facility] Genetic Counselor [email or web address] Phone: [phone]

## 2021-04-29 ENCOUNTER — Encounter (HOSPITAL_COMMUNITY): Payer: Self-pay | Admitting: General Surgery

## 2021-04-29 ENCOUNTER — Ambulatory Visit
Admission: RE | Admit: 2021-04-29 | Discharge: 2021-04-29 | Disposition: A | Discharge status: Home / Self Care | Payer: Medicaid Other | Source: Ambulatory Visit | Attending: General Surgery | Admitting: General Surgery

## 2021-04-29 ENCOUNTER — Other Ambulatory Visit: Payer: Self-pay

## 2021-04-29 ENCOUNTER — Ambulatory Visit (HOSPITAL_COMMUNITY): Payer: Medicaid Other | Admitting: Anesthesiology

## 2021-04-29 ENCOUNTER — Ambulatory Visit (HOSPITAL_COMMUNITY): Payer: Medicaid Other | Admitting: Physician Assistant

## 2021-04-29 ENCOUNTER — Encounter (HOSPITAL_COMMUNITY)
Admission: RE | Disposition: A | Discharge status: Home / Self Care | Payer: Self-pay | Source: Ambulatory Visit | Attending: General Surgery

## 2021-04-29 ENCOUNTER — Ambulatory Visit (HOSPITAL_COMMUNITY)
Admission: RE | Admit: 2021-04-29 | Discharge: 2021-04-29 | Disposition: A | Discharge status: Home / Self Care | Payer: Medicaid Other | Source: Ambulatory Visit | Attending: General Surgery | Admitting: General Surgery

## 2021-04-29 DIAGNOSIS — Z803 Family history of malignant neoplasm of breast: Secondary | ICD-10-CM | POA: Insufficient documentation

## 2021-04-29 DIAGNOSIS — Z833 Family history of diabetes mellitus: Secondary | ICD-10-CM | POA: Diagnosis not present

## 2021-04-29 DIAGNOSIS — Z170 Estrogen receptor positive status [ER+]: Secondary | ICD-10-CM | POA: Insufficient documentation

## 2021-04-29 DIAGNOSIS — C50412 Malignant neoplasm of upper-outer quadrant of left female breast: Secondary | ICD-10-CM | POA: Diagnosis not present

## 2021-04-29 DIAGNOSIS — Z8349 Family history of other endocrine, nutritional and metabolic diseases: Secondary | ICD-10-CM | POA: Insufficient documentation

## 2021-04-29 DIAGNOSIS — Z8042 Family history of malignant neoplasm of prostate: Secondary | ICD-10-CM | POA: Diagnosis not present

## 2021-04-29 DIAGNOSIS — C50912 Malignant neoplasm of unspecified site of left female breast: Secondary | ICD-10-CM | POA: Diagnosis present

## 2021-04-29 DIAGNOSIS — Z820 Family history of epilepsy and other diseases of the nervous system: Secondary | ICD-10-CM | POA: Diagnosis not present

## 2021-04-29 DIAGNOSIS — Z8249 Family history of ischemic heart disease and other diseases of the circulatory system: Secondary | ICD-10-CM | POA: Diagnosis not present

## 2021-04-29 HISTORY — PX: BREAST LUMPECTOMY WITH RADIOACTIVE SEED AND SENTINEL LYMPH NODE BIOPSY: SHX6550

## 2021-04-29 HISTORY — PX: AXILLARY LYMPH NODE BIOPSY: SHX5737

## 2021-04-29 SURGERY — BREAST LUMPECTOMY WITH RADIOACTIVE SEED AND SENTINEL LYMPH NODE BIOPSY
Anesthesia: Regional | Site: Breast | Laterality: Left

## 2021-04-29 MED ORDER — MIDAZOLAM HCL 2 MG/2ML IJ SOLN
INTRAMUSCULAR | Status: AC
  Administered 2021-04-29: 2 mg via INTRAVENOUS
  Filled 2021-04-29: qty 2

## 2021-04-29 MED ORDER — ORAL CARE MOUTH RINSE
15.0000 mL | Freq: Once | OROMUCOSAL | Status: AC
Start: 2021-04-29 — End: 2021-04-29

## 2021-04-29 MED ORDER — DEXAMETHASONE SODIUM PHOSPHATE 10 MG/ML IJ SOLN
INTRAMUSCULAR | Status: DC | PRN
  Administered 2021-04-29: 5 mg via INTRAVENOUS

## 2021-04-29 MED ORDER — PROPOFOL 10 MG/ML IV BOLUS
INTRAVENOUS | Status: DC | PRN
  Administered 2021-04-29: 50 mg via INTRAVENOUS
  Administered 2021-04-29: 150 mg via INTRAVENOUS

## 2021-04-29 MED ORDER — HEMOSTATIC AGENTS (NO CHARGE) OPTIME
TOPICAL | Status: DC | PRN
  Administered 2021-04-29: 1 via TOPICAL

## 2021-04-29 MED ORDER — BUPIVACAINE-EPINEPHRINE 0.25% -1:200000 IJ SOLN
INTRAMUSCULAR | Status: DC | PRN
  Administered 2021-04-29: 5.5 mL

## 2021-04-29 MED ORDER — METHYLENE BLUE 0.5 % INJ SOLN
INTRAVENOUS | Status: AC
  Filled 2021-04-29: qty 10

## 2021-04-29 MED ORDER — CEFAZOLIN SODIUM-DEXTROSE 2-4 GM/100ML-% IV SOLN
2.0000 g | INTRAVENOUS | Status: AC
  Administered 2021-04-29: 2 g via INTRAVENOUS
  Filled 2021-04-29: qty 100

## 2021-04-29 MED ORDER — LACTATED RINGERS IV SOLN: INTRAVENOUS | Status: DC

## 2021-04-29 MED ORDER — ONDANSETRON HCL 4 MG/2ML IJ SOLN
INTRAMUSCULAR | Status: DC | PRN
  Administered 2021-04-29: 4 mg via INTRAVENOUS

## 2021-04-29 MED ORDER — PROPOFOL 10 MG/ML IV BOLUS
INTRAVENOUS | Status: AC
  Filled 2021-04-29: qty 20

## 2021-04-29 MED ORDER — 0.9 % SODIUM CHLORIDE (POUR BTL) OPTIME
TOPICAL | Status: DC | PRN
  Administered 2021-04-29: 1000 mL

## 2021-04-29 MED ORDER — BUPIVACAINE-EPINEPHRINE (PF) 0.25% -1:200000 IJ SOLN
INTRAMUSCULAR | Status: AC
  Filled 2021-04-29: qty 30

## 2021-04-29 MED ORDER — ACETAMINOPHEN 500 MG PO TABS
1000.0000 mg | ORAL_TABLET | ORAL | Status: AC
  Administered 2021-04-29: 1000 mg via ORAL
  Filled 2021-04-29: qty 2

## 2021-04-29 MED ORDER — KETOROLAC TROMETHAMINE 15 MG/ML IJ SOLN
15.0000 mg | INTRAMUSCULAR | Status: AC
  Administered 2021-04-29: 15 mg via INTRAVENOUS
  Filled 2021-04-29: qty 1

## 2021-04-29 MED ORDER — SODIUM CHLORIDE (PF) 0.9 % IJ SOLN
INTRAMUSCULAR | Status: AC
  Filled 2021-04-29: qty 10

## 2021-04-29 MED ORDER — PROMETHAZINE HCL 25 MG/ML IJ SOLN
INTRAMUSCULAR | Status: AC
  Filled 2021-04-29: qty 1

## 2021-04-29 MED ORDER — TRAMADOL HCL 50 MG PO TABS: 100.0000 mg | ORAL_TABLET | Freq: Four times a day (QID) | ORAL | 0 refills | Status: DC | PRN

## 2021-04-29 MED ORDER — TECHNETIUM TC 99M TILMANOCEPT KIT
1.0000 | PACK | Freq: Once | INTRAVENOUS | Status: AC | PRN
  Administered 2021-04-29: 1 via INTRADERMAL

## 2021-04-29 MED ORDER — ONDANSETRON HCL 4 MG/2ML IJ SOLN
INTRAMUSCULAR | Status: AC
  Filled 2021-04-29: qty 2

## 2021-04-29 MED ORDER — FENTANYL CITRATE (PF) 100 MCG/2ML IJ SOLN: 100.0000 ug | Freq: Once | INTRAMUSCULAR | Status: AC

## 2021-04-29 MED ORDER — FENTANYL CITRATE (PF) 100 MCG/2ML IJ SOLN
INTRAMUSCULAR | Status: AC
  Administered 2021-04-29: 100 ug via INTRAVENOUS
  Filled 2021-04-29: qty 2

## 2021-04-29 MED ORDER — TRIAMCINOLONE ACETONIDE 40 MG/ML IJ SUSP
INTRAMUSCULAR | Status: AC
  Filled 2021-04-29: qty 5

## 2021-04-29 MED ORDER — ENSURE PRE-SURGERY PO LIQD: 296.0000 mL | Freq: Once | ORAL | Status: DC

## 2021-04-29 MED ORDER — MIDAZOLAM HCL 2 MG/2ML IJ SOLN: 2.0000 mg | Freq: Once | INTRAMUSCULAR | Status: AC

## 2021-04-29 MED ORDER — LIDOCAINE HCL (PF) 2 % IJ SOLN
INTRAMUSCULAR | Status: DC | PRN
  Administered 2021-04-29: 60 mg via INTRADERMAL

## 2021-04-29 MED ORDER — DEXAMETHASONE SODIUM PHOSPHATE 10 MG/ML IJ SOLN
INTRAMUSCULAR | Status: AC
  Filled 2021-04-29: qty 1

## 2021-04-29 MED ORDER — TRIAMCINOLONE ACETONIDE 40 MG/ML IJ SUSP
INTRAMUSCULAR | Status: DC | PRN
  Administered 2021-04-29: 5 mL via INTRADERMAL

## 2021-04-29 MED ORDER — LIDOCAINE HCL (PF) 2 % IJ SOLN
INTRAMUSCULAR | Status: AC
  Filled 2021-04-29: qty 5

## 2021-04-29 MED ORDER — PROMETHAZINE HCL 25 MG/ML IJ SOLN
6.2500 mg | Freq: Once | INTRAMUSCULAR | Status: AC
  Administered 2021-04-29: 6.25 mg via INTRAVENOUS

## 2021-04-29 MED ORDER — CHLORHEXIDINE GLUCONATE 0.12 % MT SOLN
15.0000 mL | Freq: Once | OROMUCOSAL | Status: AC
Start: 2021-04-29 — End: 2021-04-29
  Administered 2021-04-29: 15 mL via OROMUCOSAL
  Filled 2021-04-29: qty 15

## 2021-04-29 SURGICAL SUPPLY — 58 items
ADH SKN CLS APL DERMABOND .7 (GAUZE/BANDAGES/DRESSINGS) ×2
ADH SKN CLS LQ APL DERMABOND (GAUZE/BANDAGES/DRESSINGS) ×2
APL PRP STRL LF DISP 70% ISPRP (MISCELLANEOUS) ×2
APPLIER CLIP 9.375 MED OPEN (MISCELLANEOUS)
APR CLP MED 9.3 20 MLT OPN (MISCELLANEOUS)
BINDER BREAST LRG (GAUZE/BANDAGES/DRESSINGS) IMPLANT
BINDER BREAST XLRG (GAUZE/BANDAGES/DRESSINGS) IMPLANT
BINDER BREAST XXLRG (GAUZE/BANDAGES/DRESSINGS) ×2 IMPLANT
CANISTER SUCT 3000ML PPV (MISCELLANEOUS) ×4 IMPLANT
CHLORAPREP W/TINT 26 (MISCELLANEOUS) ×4 IMPLANT
CLIP APPLIE 9.375 MED OPEN (MISCELLANEOUS) IMPLANT
CLIP VESOCCLUDE MED 6/CT (CLIP) ×4 IMPLANT
CLOSURE STERI-STRIP 1/2X4 (GAUZE/BANDAGES/DRESSINGS) ×1
CLOSURE WOUND 1/2 X4 (GAUZE/BANDAGES/DRESSINGS) ×1
CLSR STERI-STRIP ANTIMIC 1/2X4 (GAUZE/BANDAGES/DRESSINGS) ×1 IMPLANT
CNTNR URN SCR LID CUP LEK RST (MISCELLANEOUS) IMPLANT
CONT SPEC 4OZ STRL OR WHT (MISCELLANEOUS) ×20
COVER PROBE W GEL 5X96 (DRAPES) ×4 IMPLANT
COVER SURGICAL LIGHT HANDLE (MISCELLANEOUS) ×4 IMPLANT
COVER WAND RF STERILE (DRAPES) ×4 IMPLANT
DERMABOND ADHESIVE PROPEN (GAUZE/BANDAGES/DRESSINGS) ×2
DERMABOND ADVANCED (GAUZE/BANDAGES/DRESSINGS) ×2
DERMABOND ADVANCED .7 DNX12 (GAUZE/BANDAGES/DRESSINGS) ×2 IMPLANT
DERMABOND ADVANCED .7 DNX6 (GAUZE/BANDAGES/DRESSINGS) IMPLANT
DEVICE DUBIN SPECIMEN MAMMOGRA (MISCELLANEOUS) ×4 IMPLANT
DRAPE CHEST BREAST 15X10 FENES (DRAPES) ×4 IMPLANT
ELECT COATED BLADE 2.86 ST (ELECTRODE) ×4 IMPLANT
ELECT REM PT RETURN 9FT ADLT (ELECTROSURGICAL) ×4
ELECTRODE REM PT RTRN 9FT ADLT (ELECTROSURGICAL) ×2 IMPLANT
GLOVE BIO SURGEON STRL SZ7 (GLOVE) ×4 IMPLANT
GLOVE BIOGEL PI IND STRL 7.5 (GLOVE) ×2 IMPLANT
GLOVE BIOGEL PI INDICATOR 7.5 (GLOVE) ×2
GOWN STRL REUS W/ TWL LRG LVL3 (GOWN DISPOSABLE) ×4 IMPLANT
GOWN STRL REUS W/TWL LRG LVL3 (GOWN DISPOSABLE) ×8
HEMOSTAT ARISTA ABSORB 3G PWDR (HEMOSTASIS) ×2 IMPLANT
ILLUMINATOR WAVEGUIDE N/F (MISCELLANEOUS) IMPLANT
KIT BASIN OR (CUSTOM PROCEDURE TRAY) ×4 IMPLANT
KIT MARKER MARGIN INK (KITS) ×4 IMPLANT
NDL 18GX1X1/2 (RX/OR ONLY) (NEEDLE) IMPLANT
NDL FILTER BLUNT 18X1 1/2 (NEEDLE) IMPLANT
NDL HYPO 25GX1X1/2 BEV (NEEDLE) ×2 IMPLANT
NEEDLE 18GX1X1/2 (RX/OR ONLY) (NEEDLE) IMPLANT
NEEDLE FILTER BLUNT 18X 1/2SAF (NEEDLE)
NEEDLE FILTER BLUNT 18X1 1/2 (NEEDLE) IMPLANT
NEEDLE HYPO 25GX1X1/2 BEV (NEEDLE) ×4 IMPLANT
NS IRRIG 1000ML POUR BTL (IV SOLUTION) ×4 IMPLANT
PACK GENERAL/GYN (CUSTOM PROCEDURE TRAY) ×4 IMPLANT
STRIP CLOSURE SKIN 1/2X4 (GAUZE/BANDAGES/DRESSINGS) ×3 IMPLANT
SUT MNCRL AB 4-0 PS2 18 (SUTURE) ×12 IMPLANT
SUT MON AB 5-0 PS2 18 (SUTURE) IMPLANT
SUT SILK 2 0 SH (SUTURE) ×2 IMPLANT
SUT VIC AB 2-0 SH 27 (SUTURE) ×20
SUT VIC AB 2-0 SH 27XBRD (SUTURE) ×4 IMPLANT
SUT VIC AB 3-0 SH 27 (SUTURE) ×8
SUT VIC AB 3-0 SH 27X BRD (SUTURE) ×4 IMPLANT
SYR CONTROL 10ML LL (SYRINGE) ×4 IMPLANT
TOWEL GREEN STERILE (TOWEL DISPOSABLE) ×4 IMPLANT
TOWEL GREEN STERILE FF (TOWEL DISPOSABLE) ×4 IMPLANT

## 2021-04-29 NOTE — Discharge Instructions (Signed)
[facility] Office Phone Number [phone]  BREAST BIOPSY/ PARTIAL MASTECTOMY: POST OP INSTRUCTIONS Take 400 mg of ibuprofen every 8 hours or 650 mg tylenol every 6 hours for next 72 hours then as needed. Use ice several times daily also. Always review your discharge instruction sheet given to you by the facility where your surgery was performed.  IF YOU HAVE DISABILITY OR FAMILY LEAVE FORMS, YOU MUST BRING THEM TO THE OFFICE FOR PROCESSING.  DO NOT GIVE THEM TO YOUR DOCTOR.  A prescription for pain medication may be given to you upon discharge.  Take your pain medication as prescribed, if needed.  If narcotic pain medicine is not needed, then you may take acetaminophen (Tylenol), naprosyn (Alleve) or ibuprofen (Advil) as needed. Take your usually prescribed medications unless otherwise directed If you need a refill on your pain medication, please contact your pharmacy.  They will contact our office to request authorization.  Prescriptions will not be filled after 5pm or on week-ends. You should eat very light the first 24 hours after surgery, such as soup, crackers, pudding, etc.  Resume your normal diet the day after surgery. Most patients will experience some swelling and bruising in the breast.  Ice packs and a good support bra will help.  Wear the breast binder provided or a sports bra for 72 hours day and night.  After that wear a sports bra during the day until you return to the office. Swelling and bruising can take several days to resolve.  It is common to experience some constipation if taking pain medication after surgery.  Increasing fluid intake and taking a stool softener will usually help or prevent this problem from occurring.  A mild laxative (Milk of Magnesia or Miralax) should be taken according to package directions if there are no bowel movements after 48 hours. Unless discharge instructions indicate otherwise, you may remove your bandages 48 hours after surgery  and you may shower at that time.  You may have steri-strips (small skin tapes) in place directly over the incision.  These strips should be left on the skin for 7-10 days and will come off on their own.  If your surgeon used skin glue on the incision, you may shower in 24 hours.  The glue will flake off over the next 2-3 weeks.  Any sutures or staples will be removed at the office during your follow-up visit. ACTIVITIES:  You may resume regular daily activities (gradually increasing) beginning the next day.  Wearing a good support bra or sports bra minimizes pain and swelling.  You may have sexual intercourse when it is comfortable. You may drive when you no longer are taking prescription pain medication, you can comfortably wear a seatbelt, and you can safely maneuver your car and apply brakes. RETURN TO WORK:  ______________________________________________________________________________________ You should see your doctor in the office for a follow-up appointment approximately two weeks after your surgery.  Your doctor's nurse will typically make your follow-up appointment when she calls you with your pathology report.  Expect your pathology report 3-4 business days after your surgery.  You may call to check if you do not hear from us after three days. OTHER INSTRUCTIONS: _______________________________________________________________________________________________ _____________________________________________________________________________________________________________________________________ _____________________________________________________________________________________________________________________________________ _____________________________________________________________________________________________________________________________________  WHEN TO CALL DR Lamichael Youkhana: Fever over 101.0 Nausea and/or vomiting. Extreme swelling or bruising. Continued bleeding from incision. Increased  pain, redness, or drainage from the incision.  The clinic staff is available to answer your questions during regular business hours.  Please don't hesitate to call   and ask to speak to one of the nurses for clinical concerns.  If you have a medical emergency, go to the nearest emergency room or call 911.  A surgeon from [facility] is always on call at the hospital.  For further questions, please visit [email or web address]  

## 2021-04-29 NOTE — Op Note (Signed)
Preoperative diagnosis: Clinical stage II left breast cancer Postoperative diagnosis: Same as above Procedure:  1. Left breast radioactive seed lumpectomy 2.  Left deep axillary sentinel lymph node biopsy Surgeon: Dr. Serita Grammes Anesthesia: General with a pectoral block Estimated blood loss: Minimal Complications: None Drains: None Specimens: 1.  Left breast tissue marked with paint containing seed and clip 2.  Left deep axillary sentinel lymph nodes with highest count of 1220 3.  Additional medial, superior and posterio margins marked short superior, long lateral, double deep Sponge needle count was correct completion Disposition recovery stable condition   Indications: 57 yof with screening detected left breast mass.  she has fh in mom and maternal aunts.  she has in uoq of left breast 2.1 cm in total of masses.  there are two separate adjacent masses on [place] measuring 1.4x1x0.8 cm and 5x4x4 mm.  ax [place] is negative.  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    Procedure: After informed consent was obtained the patient first underwent a pectoral block. She was given antibiotics.  SCDs were in place.  I had her mammograms in the operating room.  She was placed under general anesthesia without complication.  She was prepped and draped in the standard sterile surgical fashion.  Surgical timeout was performed..   The seed was in the upper outer quadrant.   I located the seed using the neoprobe. Due to history of keloids I elected to use one incision in the uoq for both the node and the breast tumor.  I then injected kenalog into the area of the incision prior to beginning.  I dissected to the seed.   I then removed the seed and the surrounding tissue with an attempt to get a clear margin.  I took a mammogram and confirmed removal of the seed and the clip. I removed additional margins as above.  The posterior margin is now the muslce.  I placed clips  in the cavity.  I then closed the breast tissue down with 2-0 vicryl.  From the same incision I then dissected to the axilla.  I divided the axillary fascia. I identified the radioactive node and excised this.  There were no palpable nodes.   There was no background radioactivity once I had completed this.  I obtained hemostasis in the axilla.  I then closed this with 2-0 Vicryl. The skin was closed with 3-0 Vicryl and 4 Monocryl.  Glue and Steri-Strips were applied.  She tolerated this well was extubated transferred recovery stable.

## 2021-04-29 NOTE — Transfer of Care (Signed)
Immediate Anesthesia Transfer of Care Note  Patient: Sheila [name]  Procedure(s) Performed: LEFT BREAST LUMPECTOMY WITH RADIOACTIVE SEED (Left: Breast) AXILLARY LYMPH NODE BIOPSY (Left)  Patient Location: PACU  Anesthesia Type:General and Regional  Level of Consciousness: lethargic and responds to stimulation  Airway & Oxygen Therapy: Patient Spontanous Breathing  Post-op Assessment: Report given to RN  Post vital signs: Reviewed and stable  Last Vitals:  Vitals Value Taken Time  BP 141/104   Temp    Pulse 92 04/29/21 1359  Resp 37 04/29/21 1359  SpO2 99 % 04/29/21 1359  Vitals shown include unvalidated device data.  Last Pain:  Vitals:   04/29/21 1058  TempSrc:   PainSc: 0-No pain         Complications: No notable events documented.

## 2021-04-29 NOTE — Anesthesia Procedure Notes (Signed)
Procedure Name: LMA Insertion Date/Time: 04/29/2021 12:50 PM Performed by: Barrington Ellison, CRNA Pre-anesthesia Checklist: Patient identified, Emergency Drugs available, Suction available and Patient being monitored Patient Re-evaluated:Patient Re-evaluated prior to induction Oxygen Delivery Method: Circle System Utilized Preoxygenation: Pre-oxygenation with 100% oxygen Induction Type: IV induction Ventilation: Mask ventilation without difficulty LMA: LMA inserted LMA Size: 4.0 Number of attempts: 1 Placement Confirmation: positive ETCO2 Tube secured with: Tape Dental Injury: Teeth and Oropharynx as per pre-operative assessment

## 2021-04-29 NOTE — H&P (Signed)
48 yof with screening detected left breast mass.  she has fh in mom and maternal aunts.  she has in uoq of left breast 2.1 cm in total of masses.  there are two separate adjacent masses on [place] measuring 1.4x1x0.8 cm and 5x4x4 mm.  ax [place] is negative.  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 she is here with her mom today. she has no prior history.  she has no mass or dc.  she only has prior history of btl. history significant for keloids   Diagnostic Studies History Conni Slipper, RN; 04/15/2021 8:16 AM) Colonoscopy   never Mammogram   within last year Pap Smear   1-5 years ago  Medication History Conni Slipper, RN; 04/15/2021 8:15 AM) Medications Reconciled   Social History Conni Slipper, RN; 04/15/2021 8:16 AM) Caffeine use   Carbonated beverages. No alcohol use   No drug use   Tobacco use   Never smoker.  Family History Conni Slipper, RN; 04/15/2021 8:16 AM) Anesthetic complications   Brother, Daughter. Breast Cancer   Family Members In General, Mother. Diabetes Mellitus   Father. Heart Disease   Father, Mother. Heart disease in female family member before age 67   Heart disease in female family member before age 6   Hypertension   Father, Mother. Migraine Headache   Father, Mother. Prostate Cancer   Family Members In General. Seizure disorder   Daughter, Father, Son. Thyroid problems   Family Members In General.  Pregnancy / Birth History Conni Slipper, RN; 04/15/2021 8:16 AM) Age at menarche   69 years. Age of menopause   5-50 Contraceptive History   Depo-provera. Gravida   7 Irregular periods   Length (months) of breastfeeding   3-6 Maternal age   45-20 Para   13  Other Problems Conni Slipper, RN; 04/15/2021 8:16 AM) Back Pain   Chest pain   Cholelithiasis   Gastroesophageal Reflux Disease   General anesthesia - complications   Hemorrhoids   Hepatitis   Kidney Stone   Migraine Headache   Other disease, cancer, significant illness   Sickle cell  disease   Thyroid Disease      Review of Systems Conni Slipper RN; 04/15/2021 8:16 AM) General Not Present- Appetite Loss, Chills, Fatigue, Fever, Night Sweats, Weight Gain and Weight Loss. Skin Not Present- Change in Wart/Mole, Dryness, Hives, Jaundice, New Lesions, Non-Healing Wounds, Rash and Ulcer. HEENT Present- Seasonal Allergies. Not Present- Earache, Hearing Loss, Hoarseness, Nose Bleed, Oral Ulcers, Ringing in the Ears, Sinus Pain, Sore Throat, Visual Disturbances, Wears glasses/contact lenses and Yellow Eyes. Respiratory Present- Snoring. Not Present- Bloody sputum, Chronic Cough, Difficulty Breathing and Wheezing. Breast Present- Breast Mass. Not Present- Breast Pain, Nipple Discharge and Skin Changes. Cardiovascular Not Present- Chest Pain, Difficulty Breathing Lying Down, Leg Cramps, Palpitations, Rapid Heart Rate, Shortness of Breath and Swelling of Extremities. Gastrointestinal Present- Hemorrhoids. Not Present- Abdominal Pain, Bloating, Bloody Stool, Change in Bowel Habits, Chronic diarrhea, Constipation, Difficulty Swallowing, Excessive gas, Gets full quickly at meals, Indigestion, Nausea, Rectal Pain and Vomiting. Female Genitourinary Not Present- Frequency, Nocturia, Painful Urination, Pelvic Pain and Urgency. Musculoskeletal Present- Back Pain. Not Present- Joint Pain, Joint Stiffness, Muscle Pain, Muscle Weakness and Swelling of Extremities. Neurological Present- Headaches. Not Present- Decreased Memory, Fainting, Numbness, Seizures, Tingling, Tremor, Trouble walking and Weakness. Endocrine Present- Cold Intolerance. Not Present- Excessive Hunger, Hair Changes, Heat Intolerance, Hot flashes and New Diabetes. Hematology Not Present- Blood Thinners, Easy Bruising, Excessive bleeding,  Gland problems, HIV and Persistent Infections.   Physical Exam Rolm Bookbinder MD; 04/15/2021 9:53 AM) General Mental Status - Alert. Orientation - Oriented X3. Breast Nipples - No  Discharge. Note:  left lateral breast mass with hematoma mobile Lymphatic Head & Neck General Head & Neck Lymphatics: Bilateral - Description - Normal. Axillary General Axillary Region: Bilateral - Description - Normal. 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 We discussed all of the different options for treatment for breast cancer including surgery, chemotherapy, radiation therapy, Herceptin, and antiestrogen therapy. We discussed a sentinel lymph node biopsy as she does not appear to having lymph node involvement right now. We discussed the performance of that with injection of radioactive tracer. We discussed that there is a chance of having a positive node with a sentinel lymph node biopsy and we will await the permanent pathology to make any other first further decisions in terms of her treatment. We discussed up to a 5% risk lifetime of chronic shoulder pain as well as lymphedema associated with a sentinel lymph node biopsy. We discussed the options for treatment of the breast cancer which included lumpectomy versus a mastectomy. We discussed the performance of the lumpectomy with radioactive seed placement. We discussed a 5-10% chance of a positive margin requiring reexcision in the operating room. We also discussed that she will likely need radiation therapy if she undergoes lumpectomy. We discussed mastectomy and the postoperative care for that as well. Mastectomy can be followed by reconstruction. The decision for lumpectomy vs mastectomy has no impact on decision for chemotherapy. Most mastectomy patients will not need radiation therapy. We discussed that there is no difference in her survival whether she  undergoes lumpectomy with radiation therapy or antiestrogen therapy versus a mastectomy. There is also no real difference between her recurrence in the breast. We discussed the risks of operation including bleeding, infection, possible reoperation. She understands her further therapy will be based on what her stages at the time of her operation.

## 2021-04-29 NOTE — Progress Notes (Signed)
Earring removed and placed in patient belongings bag

## 2021-04-30 ENCOUNTER — Encounter (HOSPITAL_COMMUNITY): Payer: Self-pay | Admitting: General Surgery

## 2021-04-30 MED ORDER — ROPIVACAINE HCL 5 MG/ML IJ SOLN
INTRAMUSCULAR | Status: DC | PRN
  Administered 2021-04-29: 30 mL via PERINEURAL

## 2021-04-30 MED ORDER — DEXAMETHASONE SODIUM PHOSPHATE 10 MG/ML IJ SOLN
INTRAMUSCULAR | Status: DC | PRN
  Administered 2021-04-29: 5 mg

## 2021-04-30 NOTE — Anesthesia Postprocedure Evaluation (Signed)
Anesthesia Post Note  Patient: Jazyiah S Dewan  Procedure(s) Performed: LEFT BREAST LUMPECTOMY WITH RADIOACTIVE SEED (Left: Breast) AXILLARY LYMPH NODE BIOPSY (Left)     Patient location during evaluation: PACU Anesthesia Type: Regional and General Level of consciousness: awake and alert Pain management: pain level controlled Vital Signs Assessment: post-procedure vital signs reviewed and stable Respiratory status: spontaneous breathing, nonlabored ventilation, respiratory function stable and patient connected to nasal cannula oxygen Cardiovascular status: blood pressure returned to baseline and stable Postop Assessment: no apparent nausea or vomiting Anesthetic complications: no   No notable events documented.  Last Vitals:  Vitals:   04/29/21 1501 04/29/21 1515  BP: (!) 132/91 (!) 131/95  Pulse: 70 72  Resp: 13 12  Temp:  36.7 C  SpO2: 99% 99%    Last Pain:  Vitals:   04/29/21 1515  TempSrc:   PainSc: 0-No pain                 Julieana Eshleman L Paradise Vensel

## 2021-04-30 NOTE — Anesthesia Procedure Notes (Signed)
Anesthesia Regional Block: Pectoralis block   Pre-Anesthetic Checklist: , timeout performed,  Correct Patient, Correct Site, Correct Laterality,  Correct Procedure, Correct Position, site marked,  Risks and benefits discussed,  Surgical consent,  Pre-op evaluation,  At surgeon's request and post-op pain management  Laterality: Left  Prep: Maximum Sterile Barrier Precautions used, chloraprep       Needles:  Injection technique: Single-shot  Needle Type: Echogenic Stimulator Needle     Needle Length: 9cm  Needle Gauge: 22     Additional Needles:   Procedures:,,,, ultrasound used (permanent image in chart),,    Narrative:  Start time: 04/29/2021 11:35 AM End time: 04/29/2021 11:45 AM Injection made incrementally with aspirations every 5 mL.  Performed by: Personally  Anesthesiologist: Freddrick March, MD  Additional Notes: Monitors applied. No increased pain on injection. No increased resistance to injection. Injection made in 5cc increments. Good needle visualization. Patient tolerated procedure well.

## 2021-05-01 ENCOUNTER — Encounter: Payer: Self-pay | Admitting: General Practice

## 2021-05-01 NOTE — Progress Notes (Signed)
[place] Spiritual Care Note  Reached Sheila [name] by phone for a post-op pastoral check-in. She was in good spirits, noting that 1) her pain is managed well, and 2) her faith is really growing and deepening through this experience. She is very interested in Psychologist, prison and probation services, so I will add her to our mailing list and send her an updated packet of [place] team/programing info. We plan to follow up by phone again in ca two weeks.   [place], [place], [facility] Pager [phone] Voicemail [phone]

## 2021-05-04 ENCOUNTER — Encounter: Payer: Self-pay | Admitting: *Deleted

## 2021-05-04 ENCOUNTER — Telehealth: Payer: Self-pay | Admitting: *Deleted

## 2021-05-04 LAB — SURGICAL PATHOLOGY

## 2021-05-04 NOTE — Telephone Encounter (Signed)
Received order for oncotype testing. Requisition faxed to pathology and [facility] °

## 2021-05-06 ENCOUNTER — Encounter: Payer: Self-pay | Admitting: Physical Therapy

## 2021-05-14 ENCOUNTER — Telehealth: Payer: Self-pay | Admitting: *Deleted

## 2021-05-14 ENCOUNTER — Encounter: Payer: Self-pay | Admitting: *Deleted

## 2021-05-14 DIAGNOSIS — Z170 Estrogen receptor positive status [ER+]: Secondary | ICD-10-CM

## 2021-05-14 NOTE — Telephone Encounter (Signed)
Received oncotype results of 19/6%. Patient is aware.  Referral placed for Dr. Moody. 

## 2021-05-15 ENCOUNTER — Encounter: Payer: Self-pay | Admitting: General Practice

## 2021-05-15 ENCOUNTER — Encounter: Payer: Self-pay | Admitting: Hematology

## 2021-05-15 NOTE — Progress Notes (Signed)
[place] Spiritual Care Note  Attempted follow-up pastoral phone call. Left voicemail of support, encouraging return call.   [place], [place], [facility] Pager [phone] Voicemail [phone]

## 2021-05-19 ENCOUNTER — Other Ambulatory Visit: Payer: Self-pay

## 2021-05-19 ENCOUNTER — Ambulatory Visit (HOSPITAL_COMMUNITY): Admission: EM | Admit: 2021-05-19 | Discharge: 2021-05-19 | Payer: Medicaid Other

## 2021-05-19 ENCOUNTER — Encounter (HOSPITAL_COMMUNITY): Payer: Self-pay

## 2021-05-19 ENCOUNTER — Other Ambulatory Visit: Payer: Self-pay | Admitting: *Deleted

## 2021-05-19 ENCOUNTER — Emergency Department (HOSPITAL_COMMUNITY)
Admission: EM | Admit: 2021-05-19 | Discharge: 2021-05-20 | Disposition: A | Discharge status: Home / Self Care | Payer: Medicaid Other | Attending: Emergency Medicine | Admitting: Emergency Medicine

## 2021-05-19 ENCOUNTER — Emergency Department (HOSPITAL_COMMUNITY): Payer: Medicaid Other

## 2021-05-19 DIAGNOSIS — N939 Abnormal uterine and vaginal bleeding, unspecified: Secondary | ICD-10-CM | POA: Insufficient documentation

## 2021-05-19 DIAGNOSIS — Z853 Personal history of malignant neoplasm of breast: Secondary | ICD-10-CM | POA: Diagnosis not present

## 2021-05-19 DIAGNOSIS — N950 Postmenopausal bleeding: Secondary | ICD-10-CM

## 2021-05-19 DIAGNOSIS — R42 Dizziness and giddiness: Secondary | ICD-10-CM | POA: Diagnosis not present

## 2021-05-19 DIAGNOSIS — M545 Low back pain, unspecified: Secondary | ICD-10-CM | POA: Diagnosis not present

## 2021-05-19 DIAGNOSIS — R1030 Lower abdominal pain, unspecified: Secondary | ICD-10-CM | POA: Diagnosis not present

## 2021-05-19 DIAGNOSIS — Z955 Presence of coronary angioplasty implant and graft: Secondary | ICD-10-CM | POA: Insufficient documentation

## 2021-05-19 DIAGNOSIS — R52 Pain, unspecified: Secondary | ICD-10-CM

## 2021-05-19 LAB — CBC WITH DIFFERENTIAL/PLATELET
Abs Immature Granulocytes: 0.01 10*3/uL (ref 0.00–0.07)
Basophils Absolute: 0 10*3/uL (ref 0.0–0.1)
Basophils Relative: 0 %
Eosinophils Absolute: 0 10*3/uL (ref 0.0–0.5)
Eosinophils Relative: 1 %
HCT: 39.3 % (ref 36.0–46.0)
Hemoglobin: 12.7 g/dL (ref 12.0–15.0)
Immature Granulocytes: 0 %
Lymphocytes Relative: 23 %
Lymphs Abs: 1.4 10*3/uL (ref 0.7–4.0)
MCH: 29.5 pg (ref 26.0–34.0)
MCHC: 32.3 g/dL (ref 30.0–36.0)
MCV: 91.2 fL (ref 80.0–100.0)
Monocytes Absolute: 0.5 10*3/uL (ref 0.1–1.0)
Monocytes Relative: 8 %
Neutro Abs: 4 10*3/uL (ref 1.7–7.7)
Neutrophils Relative %: 68 %
Platelets: 305 10*3/uL (ref 150–400)
RBC: 4.31 MIL/uL (ref 3.87–5.11)
RDW: 13.6 % (ref 11.5–15.5)
WBC: 5.9 10*3/uL (ref 4.0–10.5)
nRBC: 0 % (ref 0.0–0.2)

## 2021-05-19 LAB — COMPREHENSIVE METABOLIC PANEL
ALT: 22 U/L (ref 0–44)
AST: 21 U/L (ref 15–41)
Albumin: 3.8 g/dL (ref 3.5–5.0)
Alkaline Phosphatase: 57 U/L (ref 38–126)
Anion gap: 8 (ref 5–15)
BUN: 14 mg/dL (ref 6–20)
CO2: 24 mmol/L (ref 22–32)
Calcium: 9.1 mg/dL (ref 8.9–10.3)
Chloride: 107 mmol/L (ref 98–111)
Creatinine, Ser: 0.96 mg/dL (ref 0.44–1.00)
GFR, Estimated: >60 mL/min (ref >60)
Glucose, Bld: 103 mg/dL — ABNORMAL HIGH (ref 70–99)
Potassium: 4 mmol/L (ref 3.5–5.1)
Sodium: 139 mmol/L (ref 135–145)
Total Bilirubin: 1.7 mg/dL — ABNORMAL HIGH (ref 0.3–1.2)
Total Protein: 6.9 g/dL (ref 6.5–8.1)

## 2021-05-19 LAB — IRON AND TIBC
Iron: 93 ug/dL (ref 28–170)
Saturation Ratios: 19 % (ref 10.4–31.8)
TIBC: 494 ug/dL — ABNORMAL HIGH (ref 250–450)
UIBC: 401 ug/dL

## 2021-05-19 LAB — FERRITIN: Ferritin: 28 ng/mL (ref 11–307)

## 2021-05-19 LAB — RETICULOCYTES
Immature Retic Fract: 8 % (ref 2.3–15.9)
RBC.: 4.25 MIL/uL (ref 3.87–5.11)
Retic Count, Absolute: 89.7 10*3/uL (ref 19.0–186.0)
Retic Ct Pct: 2.1 % (ref 0.4–3.1)

## 2021-05-19 LAB — VITAMIN B12: Vitamin B-12: 179 pg/mL — ABNORMAL LOW (ref 180–914)

## 2021-05-19 LAB — FOLATE: Folate: 78.6 ng/mL (ref >5.9)

## 2021-05-19 NOTE — Progress Notes (Signed)
Pt called with episode of bleeding.  Pt states started today and is heavy with some clots. U/S of pelvis ordered per Dr Elgie Congo.  Pt to have f/u in office 1-2 weeks after u/s.

## 2021-05-19 NOTE — ED Triage Notes (Addendum)
Pt c/o having lower back pain, has vaginal bleeding with clots that began today around 12pm. Pt made aware that we do not do ultrasounds at this facility. Had changed sanitary pads 2 times since 12pm. Patient states she is urinating more than normal. Pt is taking in fluids as normal. Pt states she had breast surgery on 04/29/21 (breast cancer). Pt states she is having some dizziness. Gait is steady.

## 2021-05-19 NOTE — ED Provider Notes (Signed)
Emergency Medicine Provider Triage Evaluation Note  Sheila [name] , a 50 y.o. female  was evaluated in triage.  Pt complains of vaginal bleeding x today, passing clots. Has not had a menstrual cycle in  years but today began passing clots, has had 2 pads in the past 6 hours.  Endorses overall fatigue, weakness, shortness of breath.  Prior history of severe anemia.  Procedure was performed by Dr. Donne Hazel on 15 June.  Review of Systems  Positive: Vaginal bleeding, weakness, fatigue Negative: Chest pain  Physical Exam  BP (!) 161/96   Pulse (!) 104   Temp 99.2 F (37.3 C) (Oral)   Resp 16   LMP 04/03/2018 (Within Days)   SpO2 99%  Gen:   Awake, no distress   Resp:  Normal effort  MSK:   Moves extremities without difficulty  Other:    Medical Decision Making  Medically screening exam initiated at 6:28 PM.  Appropriate orders placed.  Sheila [name] was informed that the remainder of the evaluation will be completed by another provider, this initial triage assessment does not replace that evaluation, and the importance of remaining in the ED until their evaluation is complete.     Janeece Fitting, [name] 05/19/21 Dortha Schwalbe, MD 05/19/21 1900

## 2021-05-19 NOTE — ED Triage Notes (Signed)
Pt presents for eval of heavy vaginal bleeding with lower back pain since this morning. Pt has not had a period in 5-6 years. Reports recent surgery for breast cancer and having muscle spasms in chest. Went to UC for same and sent here for further workup.

## 2021-05-19 NOTE — ED Provider Notes (Signed)
Patient here with vaginal bleeding that has been going on since 12noon today. Reports passing large clots.  Recently had surgery for breast cancer.  Spoke with provider today about bleeding and they recommended getting an ultrasound for further evaluation.  Patient reports bleeding is worsening.  It was recommended that patient go to the ED for further evaluation and possible ultrasound.  Patient verbalized understanding.     Pearson Forster, NP 05/19/21 [phone]

## 2021-05-19 NOTE — ED Notes (Signed)
Patient is being discharged from the Urgent Care and sent to the Emergency Department via personal vehicle . Per Provider Caroll Rancher, patient is in need of higher level of care due to needing abdominal imaging. Patient is aware and verbalizes understanding of plan of care.  Vitals:   05/19/21 1800  BP: 131/84  Pulse: 98  Resp: 18  Temp: 98 F (36.7 C)  SpO2: 97%

## 2021-05-20 ENCOUNTER — Emergency Department (HOSPITAL_COMMUNITY): Payer: Medicaid Other

## 2021-05-20 LAB — PROTIME-INR
INR: 1 (ref 0.8–1.2)
Prothrombin Time: 13.1 seconds (ref 11.4–15.2)

## 2021-05-20 MED ORDER — MEDROXYPROGESTERONE ACETATE 5 MG PO TABS: 5.0000 mg | ORAL_TABLET | Freq: Every day | ORAL | 0 refills | Status: DC

## 2021-05-20 MED ORDER — ONDANSETRON HCL 4 MG/2ML IJ SOLN
4.0000 mg | Freq: Once | INTRAMUSCULAR | Status: AC
  Administered 2021-05-20: 4 mg via INTRAVENOUS
  Filled 2021-05-20: qty 2

## 2021-05-20 MED ORDER — KETOROLAC TROMETHAMINE 30 MG/ML IJ SOLN
30.0000 mg | Freq: Once | INTRAMUSCULAR | Status: AC
  Administered 2021-05-20: 30 mg via INTRAVENOUS
  Filled 2021-05-20: qty 1

## 2021-05-20 MED ORDER — IBUPROFEN 800 MG PO TABS: 800.0000 mg | ORAL_TABLET | Freq: Three times a day (TID) | ORAL | 0 refills | Status: DC | PRN

## 2021-05-20 MED ORDER — SODIUM CHLORIDE 0.9 % IV BOLUS
500.0000 mL | Freq: Once | INTRAVENOUS | Status: AC
  Administered 2021-05-20: 500 mL via INTRAVENOUS

## 2021-05-20 NOTE — ED Notes (Signed)
Pt remains in u/s.

## 2021-05-20 NOTE — Discharge Instructions (Addendum)
You were seen in the emergency department today with abnormal uterine bleeding.  We discussed that a very short course of hormone treatment may stop your bleeding.  I would like for you to talk to your OB/GYN regarding this treatment this afternoon.  I have called in some ibuprofen to your pharmacy as well.  Return to the emergency department any new or suddenly worsening symptoms.

## 2021-05-20 NOTE — ED Provider Notes (Signed)
Emergency Department Provider Note   I have reviewed the triage vital signs and the nursing notes.   HISTORY  Chief Complaint Vaginal Bleeding and Back Pain   HPI Sheila MCDOUGALD is a 50 y.o. female with past medical history reviewed below presents to the emergency department with abdominal/back pain and heavy vaginal bleeding.  The patient has not had a menstrual cycle in 5 to 6 years.  She notes heavy vaginal bleeding over the past 24 hours.  She is gone through approximately 3 pads in a 24-hour period.  She is passing bright red blood along with some clots.  She is having pain in the lower abdomen and back.  She is feeling slightly lightheaded.  No fevers or chills.  No dysuria, hesitancy, urgency.  No chest pain or shortness of breath.  No radiation of symptoms or other modifying factors.  Past Medical History:  Diagnosis Date   Anemia    Anginal pain ([place])    Breast cancer ([place])    Family history of bone cancer    Family history of breast cancer    Family history of prostate cancer    Family history of throat cancer    Malignant neoplasm of upper-outer quadrant of left breast in female, estrogen receptor positive ([place]) 04/08/2021    Patient Active Problem List   Diagnosis Date Noted   Genetic testing 04/21/2021   Family history of breast cancer    Family history of bone cancer    Family history of prostate cancer    Family history of throat cancer    Malignant neoplasm of upper-outer quadrant of left breast in female, estrogen receptor positive ([place]) 04/08/2021   Women's annual routine gynecological examination 02/25/2021   Screening examination for STD (sexually transmitted disease) 02/25/2021   Skin lesion 02/25/2021   Chest pain at rest 05/30/2014   Ejection fraction    Abnormal EKG 04/24/2014   Chest discomfort 04/23/2014   Panic attack 04/23/2014   OBESITY 01/15/2009   HAIR LOSS 01/15/2009   ANEMIA-NOS 01/14/2009    Past Surgical History:  Procedure  Laterality Date   AXILLARY LYMPH NODE BIOPSY Left 04/29/2021   Procedure: AXILLARY LYMPH NODE BIOPSY;  Surgeon: Rolm Bookbinder, MD;  Location: [facility];  Service: General;  Laterality: Left;   BREAST LUMPECTOMY WITH RADIOACTIVE SEED AND SENTINEL LYMPH NODE BIOPSY Left 04/29/2021   Procedure: LEFT BREAST LUMPECTOMY WITH RADIOACTIVE SEED;  Surgeon: Rolm Bookbinder, MD;  Location: [facility];  Service: General;  Laterality: Left;   CARDIAC CATHETERIZATION     LEFT HEART CATHETERIZATION WITH CORONARY ANGIOGRAM N/A 05/30/2014   Procedure: LEFT HEART CATHETERIZATION WITH CORONARY ANGIOGRAM;  Surgeon: Peter M Martinique, MD;  Location: [facility] CATH LAB;  Service: Cardiovascular;  Laterality: N/A;   TUBAL LIGATION      Allergies Patient has no known allergies.  Family History  Problem Relation Age of Onset   Hypertension Mother    Heart Problems Mother    Hyperlipidemia Mother    Breast cancer Mother 46   Diabetes Father    Seizures Father    Hypertension Maternal Aunt    Hypertension Maternal Uncle    Hypertension Maternal Grandmother    Stroke Maternal Grandmother    Breast cancer Maternal Grandmother 44   Stroke Maternal Grandfather    Breast cancer Cousin        dx 57s, bilateral mastectomies (maternal first cousin)   Cancer Paternal Aunt        unknown type  Cancer Paternal Uncle        unknown type   Breast cancer Other        great-aunt (MGF's sister)   Prostate cancer Maternal Uncle    Prostate cancer Maternal Uncle    Cancer Paternal Uncle        unknown type   Throat cancer Paternal Uncle    Bone cancer Nephew 11       knee, great-nephew   Breast cancer Other        first cousin once removed (MGF's niece)   Breast cancer Other        first cousin once removed (MGF's niece)   Cancer Other        unknown type, great-uncle (MGM's brother)   Breast cancer Other        first cousin once removed (MGM's niece)   Breast cancer Other        first cousin once removed (MGM's niece)    Prostate cancer Other        first cousin once removed (MGM's nephew)   Breast cancer Other        second cousin (MGM's great-niece)   Cancer Other        unknown type, dx 2s (maternal aunt's granddaughter)   Sickle cell anemia Other     Social History Social History   Tobacco Use   Smoking status: Never   Smokeless tobacco: Never  Vaping Use   Vaping Use: Never used  Substance Use Topics   Alcohol use: No   Drug use: No    Review of Systems  Constitutional: No fever/chills Eyes: No visual changes. ENT: No sore throat. Cardiovascular: Denies chest pain. Respiratory: Denies shortness of breath. Gastrointestinal: Positive lower abdominal pain.  No nausea, no vomiting.  No diarrhea.  No constipation. Genitourinary: Negative for dysuria. Positive vaginal bleeding.  Musculoskeletal: Positive for back pain. Skin: Negative for rash. Neurological: Negative for headaches, focal weakness or numbness.  10-point ROS otherwise negative.  ____________________________________________   PHYSICAL EXAM:  VITAL SIGNS: ED Triage Vitals  Enc Vitals Group     BP 05/19/21 1826 (!) 161/96     Pulse Rate 05/19/21 1826 (!) 104     Resp 05/19/21 1826 16     Temp 05/19/21 1826 99.2 F (37.3 C)     Temp Source 05/19/21 1826 Oral     SpO2 05/19/21 1826 99 %   Constitutional: Alert and oriented. Well appearing and in no acute distress. Eyes: Conjunctivae are normal.  Head: Atraumatic. Nose: No congestion/rhinnorhea. Mouth/Throat: Mucous membranes are moist.   Neck: No stridor.   Cardiovascular: Normal rate, regular rhythm. Good peripheral circulation. Grossly normal heart sounds.   Respiratory: Normal respiratory effort.  No retractions. Lungs CTAB. Gastrointestinal: Soft w/ mild tenderness throughout. No peritoneal findings. No distention.  Genitourinary: Exam performed with patient's verbal consent and nurse chaperone.  Normal external genitalia.  Patient has a small volume, older  appearing blood in the vaginal vault.  Normal-appearing cervix. Musculoskeletal: No lower extremity tenderness nor edema. No gross deformities of extremities. Neurologic:  Normal speech and language. No gross focal neurologic deficits are appreciated.  Skin:  Skin is warm, dry and intact. No rash noted.   ____________________________________________   LABS (all labs ordered are listed, but only abnormal results are displayed)  Labs Reviewed  VITAMIN B12 - Abnormal; Notable for the following components:      Result Value   Vitamin B-12 179 (*)    All other components  within normal limits  IRON AND TIBC - Abnormal; Notable for the following components:   TIBC 494 (*)    All other components within normal limits  COMPREHENSIVE METABOLIC PANEL - Abnormal; Notable for the following components:   Glucose, Bld 103 (*)    Total Bilirubin 1.7 (*)    All other components within normal limits  FOLATE  FERRITIN  RETICULOCYTES  CBC WITH DIFFERENTIAL/PLATELET  PROTIME-INR  TYPE AND SCREEN   ____________________________________________  RADIOLOGY  DG Chest 1 View  Result Date: 05/19/2021 CLINICAL DATA:  Vaginal bleeding EXAM: CHEST  1 VIEW COMPARISON:  12/09/2020 FINDINGS: The heart size and mediastinal contours are within normal limits. Both lungs are clear. The visualized skeletal structures are unremarkable. IMPRESSION: No active disease. Electronically Signed   By: Donavan Foil M.D.   On: 05/19/2021 19:12   US PELVIC COMPLETE W TRANSVAGINAL AND TORSION R/O  Result Date: 05/20/2021 CLINICAL DATA:  Abnormal postmenopausal bleeding EXAM: TRANSABDOMINAL AND TRANSVAGINAL ULTRASOUND OF PELVIS DOPPLER ULTRASOUND OF OVARIES TECHNIQUE: Both transabdominal and transvaginal ultrasound examinations of the pelvis were performed. Transabdominal technique was performed for global imaging of the pelvis including uterus, ovaries, adnexal regions, and pelvic cul-de-sac. It was necessary to proceed with  endovaginal exam following the transabdominal exam to visualize the endometrium and ovaries. Color and duplex Doppler ultrasound was utilized to evaluate blood flow to the ovaries. COMPARISON:  CT 04/05/2021, ultrasound 07/29/2016 FINDINGS: Uterus Measurements: 9.3 x 4.4 x 4.7 cm = volume: 100 mL. Retroverted. No fibroids or other mass visualized. Endometrium Thickness: 4 mm.  No focal abnormality visualized. Right ovary Measurements: 2.6 x 1.3 x 1.8 cm = volume: 3 mL. Normal appearance/no adnexal mass. Left ovary Measurements: 1.6 x 0.8 x 1.5 cm = volume: 1 mL. Normal appearance/no adnexal mass. Pulsed Doppler evaluation of the right ovary demonstrates normal low-resistance arterial and venous waveforms. Color Doppler flow is visualized within the left ovary. Pulsed Doppler interrogation was unable to be adequately performed secondary to deep location of the left ovary within the pelvis. Other findings No abnormal free fluid. IMPRESSION: 1. Endometrial thickness of 4 mm, within normal limits. In the setting of post-menopausal bleeding, this is consistent with a benign etiology such as endometrial atrophy. If bleeding remains unresponsive to hormonal or medical therapy, sonohysterogram should be considered for focal lesion work-up. (Ref: Radiological Reasoning: Algorithmic Workup of Abnormal Vaginal Bleeding with Endovaginal Sonography and Sonohysterography. AJR 2008; 209:O70-96) 2. Unremarkable appearance of the bilateral ovaries. Pulsed Doppler waveforms were unable to be elicited within the left ovary secondary to deep location within the pelvis. Color Doppler flow was visualized. No evidence to suggest adnexal torsion. Electronically Signed   By: Davina Poke D.O.   On: 05/20/2021 09:11    ____________________________________________   PROCEDURES  Procedure(s) performed:   Procedures  Sheila [name]  ____________________________________________   INITIAL IMPRESSION / ASSESSMENT AND PLAN / ED  COURSE  Pertinent labs & imaging results that were available during my care of the patient were reviewed by me and considered in my medical decision making (see chart for details).   Patient presents to the emergency department with postmenopausal vaginal bleeding and back pain.  Vital signs have normalized from triage with mild tachycardia on arrival.  Plan for transvaginal ultrasound.  No anemia here.  Have added INR and type and screen.   Labs reviewed.  Patient is not anemic.  She is hemodynamically stable.  Back pain improved with Toradol.  She is not on hormone therapy for her breast  cancer.  In review of notes she is estrogen and progesterone positive for her breast cancer.  Discussed this with the OB/GYN on-call along with the patient.  Could try a very brief course of Provera versus watchful waiting and close GYN follow-up as an outpatient.  Discussed these options with the patient.  I will send the prescription for Provera to her pharmacy but she plans to discuss this further with her GYN team prior to starting this therapy. ____________________________________________  FINAL CLINICAL IMPRESSION(S) / ED DIAGNOSES  Final diagnoses:  Abnormal vaginal bleeding     MEDICATIONS GIVEN DURING THIS VISIT:  Medications  ketorolac (TORADOL) 30 MG/ML injection 30 mg (30 mg Intravenous Given 05/20/21 0935)  sodium chloride 0.9 % bolus 500 mL (0 mLs Intravenous Stopped 05/20/21 1053)  ondansetron (ZOFRAN) injection 4 mg (4 mg Intravenous Given 05/20/21 0935)     NEW OUTPATIENT MEDICATIONS STARTED DURING THIS VISIT:  New Prescriptions   IBUPROFEN (ADVIL) 800 MG TABLET    Take 1 tablet (800 mg total) by mouth every 8 (eight) hours as needed for moderate pain.   MEDROXYPROGESTERONE (PROVERA) 5 MG TABLET    Take 1 tablet (5 mg total) by mouth daily.    Note:  This document was prepared using Dragon voice recognition software and may include unintentional dictation errors.  Nanda Quinton, MD,  [facility] Emergency Medicine    Delwin Raczkowski, [facility], MD 05/20/21 [phone]

## 2021-05-20 NOTE — ED Notes (Signed)
Attempt IV access and blood draw unsuccessful, restricted extremity pt, IV team consulted, pt to u/s

## 2021-05-21 ENCOUNTER — Encounter (HOSPITAL_COMMUNITY): Payer: Self-pay

## 2021-05-21 ENCOUNTER — Telehealth: Payer: Self-pay | Admitting: *Deleted

## 2021-05-21 NOTE — Telephone Encounter (Signed)
Pt and daughter called to office to follow up regarding previous call this week about vaginal bleeding.  Pt was seen at the ED this week and had u/s at that time.  Recommend f/u with GYN provider for medical management.   Would like to know what can be done to help stop the bleeding.   Advised message would be sent to provider for review and recommendations. Pt is scheduled for f/u with Dr Elgie Congo.    Please review recent u/s from ED and advise on recommendations.

## 2021-05-25 ENCOUNTER — Ambulatory Visit: Payer: Medicaid Other | Admitting: Physical Therapy

## 2021-05-25 ENCOUNTER — Encounter: Payer: Self-pay | Admitting: *Deleted

## 2021-05-27 NOTE — Telephone Encounter (Signed)
Pt and daughter made aware of recommendations. Advised to check with breat provider to determine if she may have some form of hormonal therapy for bleeding control.  They will discuss with provider and try to get letter with recommendations.

## 2021-05-28 ENCOUNTER — Ambulatory Visit
Admission: RE | Admit: 2021-05-28 | Discharge: 2021-05-28 | Disposition: A | Discharge status: Home / Self Care | Payer: Medicaid Other | Source: Ambulatory Visit | Attending: Radiation Oncology | Admitting: Radiation Oncology

## 2021-05-28 ENCOUNTER — Other Ambulatory Visit: Payer: Self-pay

## 2021-05-28 ENCOUNTER — Ambulatory Visit: Payer: Medicaid Other | Admitting: Physical Therapy

## 2021-05-28 ENCOUNTER — Encounter: Payer: Self-pay | Admitting: Radiation Oncology

## 2021-05-28 ENCOUNTER — Encounter: Payer: Self-pay | Admitting: Physical Therapy

## 2021-05-28 VITALS — BP 123/81 | HR 97 | Temp 98.4°F | Resp 18 | Ht 69.0 in | Wt 207.2 lb

## 2021-05-28 DIAGNOSIS — Z510 Encounter for antineoplastic radiation therapy: Secondary | ICD-10-CM | POA: Insufficient documentation

## 2021-05-28 DIAGNOSIS — Z483 Aftercare following surgery for neoplasm: Secondary | ICD-10-CM

## 2021-05-28 DIAGNOSIS — C50412 Malignant neoplasm of upper-outer quadrant of left female breast: Secondary | ICD-10-CM | POA: Insufficient documentation

## 2021-05-28 DIAGNOSIS — Z170 Estrogen receptor positive status [ER+]: Secondary | ICD-10-CM | POA: Insufficient documentation

## 2021-05-28 DIAGNOSIS — R600 Localized edema: Secondary | ICD-10-CM

## 2021-05-28 DIAGNOSIS — R293 Abnormal posture: Secondary | ICD-10-CM

## 2021-05-28 DIAGNOSIS — M25612 Stiffness of left shoulder, not elsewhere classified: Secondary | ICD-10-CM | POA: Insufficient documentation

## 2021-05-28 NOTE — Progress Notes (Signed)
Radiation Oncology         [phone]) [phone] ________________________________  Name: Sheila [name]        MRN: [id number]  Date of Service: 05/28/2021 DOB: [birth year]  MH:[id number], Carolann Littler, MD  Truitt Merle, MD     REFERRING PHYSICIAN: Truitt Merle, MD   DIAGNOSIS: The encounter diagnosis was Malignant neoplasm of upper-outer quadrant of left breast in female, estrogen receptor positive ([place]).   HISTORY OF PRESENT ILLNESS: Sheila [name] is a 50 y.o. female originally seen in the multidisciplinary breast clinic for a new diagnosis of a left breast cancer. The patient was noted to have a screening detected left breast mass at 2:00 that measured 1.4 cm and a contiguous mass measuring 5 mm was also noted and in total these were measured at 2.1 cm and her axilla was negative for adenopathy. A biopsy on 04/03/21 showed a grade 2 invasive ductal carcinoma that was ER/PR positive, Her2 negative with a KI 67 of 15%.   Since her last visit, the patient has undergone left lumpectomy with sentinel lymph node biopsy on 04/29/2021, final pathology reveals grade 2 invasive and in situ ductal carcinoma measuring up to 1.6 cm, invasive carcinoma was 1 mm from the lateral margin, additional specimens from the left posterior margin left superior margin and left medial margin all contained benign findings.  Her sentinel lymph node that was sampled was negative for disease.  Oncotype score was performed and was noted to be 19 and no systemic chemotherapy is planned.  She is seen today to discuss adjuvant radiotherapy.  PREVIOUS RADIATION THERAPY: No   PAST MEDICAL HISTORY:  Past Medical History:  Diagnosis Date   Anemia    Anginal pain ([place])    Breast cancer ([place])    Family history of bone cancer    Family history of breast cancer    Family history of prostate cancer    Family history of throat cancer    Malignant neoplasm of upper-outer quadrant of left breast in female, estrogen receptor positive ([place])  04/08/2021       PAST SURGICAL HISTORY: Past Surgical History:  Procedure Laterality Date   AXILLARY LYMPH NODE BIOPSY Left 04/29/2021   Procedure: AXILLARY LYMPH NODE BIOPSY;  Surgeon: Rolm Bookbinder, MD;  Location: [place];  Service: General;  Laterality: Left;   BREAST LUMPECTOMY WITH RADIOACTIVE SEED AND SENTINEL LYMPH NODE BIOPSY Left 04/29/2021   Procedure: LEFT BREAST LUMPECTOMY WITH RADIOACTIVE SEED;  Surgeon: Rolm Bookbinder, MD;  Location: [facility];  Service: General;  Laterality: Left;   CARDIAC CATHETERIZATION     LEFT HEART CATHETERIZATION WITH CORONARY ANGIOGRAM N/A 05/30/2014   Procedure: LEFT HEART CATHETERIZATION WITH CORONARY ANGIOGRAM;  Surgeon: Peter M Martinique, MD;  Location: [facility] CATH LAB;  Service: Cardiovascular;  Laterality: N/A;   TUBAL LIGATION       FAMILY HISTORY:  Family History  Problem Relation Age of Onset   Hypertension Mother    Heart Problems Mother    Hyperlipidemia Mother    Breast cancer Mother 80   Diabetes Father    Seizures Father    Hypertension Maternal Aunt    Hypertension Maternal Uncle    Hypertension Maternal Grandmother    Stroke Maternal Grandmother    Breast cancer Maternal Grandmother 62   Stroke Maternal Grandfather    Breast cancer Cousin        dx 40s, bilateral mastectomies (maternal first cousin)   Cancer Paternal 60  unknown type   Cancer Paternal Uncle        unknown type   Breast cancer Other        great-aunt (MGF's sister)   Prostate cancer Maternal Uncle    Prostate cancer Maternal Uncle    Cancer Paternal Uncle        unknown type   Throat cancer Paternal Uncle    Bone cancer Nephew 11       knee, great-nephew   Breast cancer Other        first cousin once removed (MGF's niece)   Breast cancer Other        first cousin once removed (MGF's niece)   Cancer Other        unknown type, great-uncle (MGM's brother)   Breast cancer Other        first cousin once removed (MGM's niece)   Breast cancer  Other        first cousin once removed (MGM's niece)   Prostate cancer Other        first cousin once removed (MGM's nephew)   Breast cancer Other        second cousin (MGM's great-niece)   Cancer Other        unknown type, dx 44s (maternal aunt's granddaughter)   Sickle cell anemia Other      SOCIAL HISTORY:  reports that she has never smoked. She has never used smokeless tobacco. She reports that she does not drink alcohol and does not use drugs.  The patient is married and lives in [place].  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   ALLERGIES: Patient has no known allergies.   MEDICATIONS:  Current Outpatient Medications  Medication Sig Dispense Refill   ibuprofen (ADVIL) 800 MG tablet Take 1 tablet (800 mg total) by mouth every 8 (eight) hours as needed for moderate pain. 21 tablet 0   medroxyPROGESTERone (PROVERA) 5 MG tablet Take 1 tablet (5 mg total) by mouth daily. 5 tablet 0   traMADol (ULTRAM) 50 MG tablet Take 2 tablets (100 mg total) by mouth every 6 (six) hours as needed. 10 tablet 0   No current facility-administered medications for this encounter.     REVIEW OF SYSTEMS: On review of systems, the patient reports that she is doing well overall. She is going to get a compression bra at the recommendation of PT. She denies any pain currently but has had some soreness since surgery. No other complaints are verbalized.    PHYSICAL EXAM:  Wt Readings from Last 3 Encounters:  04/29/21 212 lb 4.9 oz (96.3 kg)  04/24/21 212 lb 6.4 oz (96.3 kg)  04/22/21 212 lb 1.6 oz (96.2 kg)   Temp Readings from Last 3 Encounters:  05/20/21 98.3 F (36.8 C) (Oral)  05/19/21 98 F (36.7 C) (Oral)  04/29/21 98 F (36.7 C)   BP Readings from Last 3 Encounters:  05/20/21 138/87  05/19/21 131/84  04/29/21 (!) 131/95   Pulse Readings from Last 3 Encounters:  05/20/21 77  05/19/21 98  04/29/21 72    In general this is a well appearing African-[name]  female in no acute distress. She's alert and oriented x4 and appropriate throughout the examination. Cardiopulmonary assessment is negative for acute distress and she exhibits normal effort. Her left breast incision site reveals a well healed scar with steri strips in place without erythema.  Her large central keloid of the chest wall is still present.  ECOG = 0  0 - Asymptomatic (Fully active, able to carry on all predisease activities without restriction)  1 - Symptomatic but completely ambulatory (Restricted in physically strenuous activity but ambulatory and able to carry out work of a light or sedentary nature. For example, light housework, office work)  2 - Symptomatic, <50% in bed during the day (Ambulatory and capable of all self care but unable to carry out any work activities. Up and about more than 50% of waking hours)  3 - Symptomatic, >50% in bed, but not bedbound (Capable of only limited self-care, confined to bed or chair 50% or more of waking hours)  4 - Bedbound (Completely disabled. Cannot carry on any self-care. Totally confined to bed or chair)  5 - Death   Eustace Pen MM, Creech RH, Tormey DC, et al. [id number]). "Toxicity and response criteria of the [facility]". [place] Oncol. 5 (6): 649-55    LABORATORY DATA:  Lab Results  Component Value Date   WBC 5.9 05/19/2021   HGB 12.7 05/19/2021   HCT 39.3 05/19/2021   MCV 91.2 05/19/2021   PLT 305 05/19/2021   Lab Results  Component Value Date   NA 139 05/19/2021   K 4.0 05/19/2021   CL 107 05/19/2021   CO2 24 05/19/2021   Lab Results  Component Value Date   ALT 22 05/19/2021   AST 21 05/19/2021   ALKPHOS 57 05/19/2021   BILITOT 1.7 (H) 05/19/2021      RADIOGRAPHY: DG Chest 1 View  Result Date: 05/19/2021 CLINICAL DATA:  Vaginal bleeding EXAM: CHEST  1 VIEW COMPARISON:  12/09/2020 FINDINGS: The heart size and mediastinal contours are within normal limits. Both lungs are clear. The  visualized skeletal structures are unremarkable. IMPRESSION: No active disease. Electronically Signed   By: Donavan Foil M.D.   On: 05/19/2021 19:12   NM Sentinel Node Inj-No Rpt (Breast)  Result Date: 04/29/2021 Sulfur Colloid was injected by the Nuclear Medicine Technologist for sentinel lymph node localization.   MM Breast Surgical Specimen  Result Date: 04/29/2021 CLINICAL DATA:  Left lumpectomy for recently diagnosed invasive mammary carcinoma in the 2 o'clock position of the left breast, marked with a ribbon shaped biopsy marker clip. EXAM: SPECIMEN RADIOGRAPH OF THE LEFT BREAST COMPARISON:  Previous exam(s). FINDINGS: Status post excision of the left breast. The radioactive seed, spiculated mass and biopsy marker clip are present, completely intact, and were marked for pathology. IMPRESSION: Specimen radiograph of the left breast. Electronically Signed   By: Claudie Revering M.D.   On: 04/29/2021 13:22  [place] LT RADIOACTIVE SEED LOC  Result Date: 04/28/2021 CLINICAL DATA:  Ultrasound-guided radioactive seed placement EXAM: ULTRASOUND GUIDED RADIOACTIVE SEED LOCALIZATION OF THE LEFT BREAST COMPARISON:  Previous exam(s). FINDINGS: Patient presents for radioactive seed localization prior to surgery. I met with the patient and we discussed the procedure of seed localization including benefits and alternatives. We discussed the high likelihood of a successful procedure. We discussed the risks of the procedure including infection, bleeding, tissue injury and further surgery. We discussed the low dose of radioactivity involved in the procedure. Informed, written consent was given. The usual time-out protocol was performed immediately prior to the procedure. Using ultrasound guidance, sterile technique, 1% lidocaine and an I-125 radioactive seed, the known malignancy and biopsy clip were localized using a lateral approach. The follow-up mammogram images confirm the seed in the expected location and were marked  for the surgeon. Follow-up survey of the patient confirms presence  of the radioactive seed. Order number of I-125 seed:  [id number]. Total activity:  4.098 millicurie reference Date: Apr 06, 2021 The patient tolerated the procedure well and was released from the [place]. She was given instructions regarding seed removal. Follow-up mammogram demonstrated the radioactive seed immediately adjacent to the biopsy clip within the known malignancy. IMPRESSION: Radioactive seed localization left breast. No apparent complications. Electronically Signed   By: Dorise Bullion III M.D   On: 04/28/2021 10:54  US PELVIC COMPLETE W TRANSVAGINAL AND TORSION R/O  Result Date: 05/20/2021 CLINICAL DATA:  Abnormal postmenopausal bleeding EXAM: TRANSABDOMINAL AND TRANSVAGINAL ULTRASOUND OF PELVIS DOPPLER ULTRASOUND OF OVARIES TECHNIQUE: Both transabdominal and transvaginal ultrasound examinations of the pelvis were performed. Transabdominal technique was performed for global imaging of the pelvis including uterus, ovaries, adnexal regions, and pelvic cul-de-sac. It was necessary to proceed with endovaginal exam following the transabdominal exam to visualize the endometrium and ovaries. Color and duplex Doppler ultrasound was utilized to evaluate blood flow to the ovaries. COMPARISON:  CT 04/05/2021, ultrasound 07/29/2016 FINDINGS: Uterus Measurements: 9.3 x 4.4 x 4.7 cm = volume: 100 mL. Retroverted. No fibroids or other mass visualized. Endometrium Thickness: 4 mm.  No focal abnormality visualized. Right ovary Measurements: 2.6 x 1.3 x 1.8 cm = volume: 3 mL. Normal appearance/no adnexal mass. Left ovary Measurements: 1.6 x 0.8 x 1.5 cm = volume: 1 mL. Normal appearance/no adnexal mass. Pulsed Doppler evaluation of the right ovary demonstrates normal low-resistance arterial and venous waveforms. Color Doppler flow is visualized within the left ovary. Pulsed Doppler interrogation was unable to be adequately performed secondary to  deep location of the left ovary within the pelvis. Other findings No abnormal free fluid. IMPRESSION: 1. Endometrial thickness of 4 mm, within normal limits. In the setting of post-menopausal bleeding, this is consistent with a benign etiology such as endometrial atrophy. If bleeding remains unresponsive to hormonal or medical therapy, sonohysterogram should be considered for focal lesion work-up. (Ref: Radiological Reasoning: Algorithmic Workup of Abnormal Vaginal Bleeding with Endovaginal Sonography and Sonohysterography. AJR 2008; 119:J47-82) 2. Unremarkable appearance of the bilateral ovaries. Pulsed Doppler waveforms were unable to be elicited within the left ovary secondary to deep location within the pelvis. Color Doppler flow was visualized. No evidence to suggest adnexal torsion. Electronically Signed   By: Davina Poke D.O.   On: 05/20/2021 09:11   MM CLIP PLACEMENT LEFT  Result Date: 04/28/2021 CLINICAL DATA:  Ultrasound-guided radioactive seed placement EXAM: ULTRASOUND GUIDED RADIOACTIVE SEED LOCALIZATION OF THE LEFT BREAST COMPARISON:  Previous exam(s). FINDINGS: Patient presents for radioactive seed localization prior to surgery. I met with the patient and we discussed the procedure of seed localization including benefits and alternatives. We discussed the high likelihood of a successful procedure. We discussed the risks of the procedure including infection, bleeding, tissue injury and further surgery. We discussed the low dose of radioactivity involved in the procedure. Informed, written consent was given. The usual time-out protocol was performed immediately prior to the procedure. Using ultrasound guidance, sterile technique, 1% lidocaine and an I-125 radioactive seed, the known malignancy and biopsy clip were localized using a lateral approach. The follow-up mammogram images confirm the seed in the expected location and were marked for the surgeon. Follow-up survey of the patient confirms  presence of the radioactive seed. Order number of I-125 seed:  [id number]. Total activity:  5.784 millicurie reference Date: Apr 06, 2021 The patient tolerated the procedure well and was released from the [place]. She was given instructions  regarding seed removal. Follow-up mammogram demonstrated the radioactive seed immediately adjacent to the biopsy clip within the known malignancy. IMPRESSION: Radioactive seed localization left breast. No apparent complications. Electronically Signed   By: Dorise Bullion III M.D   On: 04/28/2021 10:54       IMPRESSION/PLAN: 1. 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 Dr. Lisbeth Renshaw discusses the final pathology findings and reviews the nature of early stage left breast disease. She has done well since surgery, no systemic chemo is necessary.  Dr. Lisbeth Renshaw recommends a course of external radiotherapy to the breast  to reduce risks of local recurrence followed by antiestrogen therapy. We discussed the risks, benefits, short, and long term effects of radiotherapy, as well as the curative intent, and the patient is interested in proceeding. Dr. Lisbeth Renshaw discusses the delivery and logistics of radiotherapy and recommends 4  weeks of radiotherapy to the left breast with deep inspiration breath-hold technique. Written consent is obtained and placed in the chart, a copy was provided to the patient. She will continue with simulation this afternoon. 2. Contraceptive counseling.  The patient has had a tubal ligation and no additional pregnancy testing will be recommended prior to proceeding with simulation and treatment. 3. Keloid Scar. The patient may be interested in having this site treated in the future and we will follow this expectantly.    In a visit lasting 45 minutes, greater than 50% of the time was spent face to face reviewing her case, as well as in preparation of, discussing, and coordinating the patient's care.  The above  documentation reflects my direct findings during this shared patient visit. Please see the separate note by Dr. Lisbeth Renshaw on this date for the remainder of the patient's plan of care.    Carola Rhine, [facility]    **Disclaimer: This note was dictated with voice recognition software. Similar sounding words can inadvertently be transcribed and this note may contain transcription errors which may not have been corrected upon publication of note.**

## 2021-05-28 NOTE — Patient Instructions (Addendum)
            [facility]         [address], [place] [address]         [phone]         Annia Friendly, PT, CLT   After Breast Cancer Class It is recommended you attend the ABC class to be educated on lymphedema risk reduction. This class is free of charge and lasts for 1 hour. It is a 1-time class.  You are scheduled for August 1st at 11:00 and we'll send you a link to the Webex class.  Scar massage You can begin gentle scar massage once the steri-strips come off. Use coconut oil and do this a few minutes each day. Make sure you don't have any oil on when you go for radiation.  Compression garment You need a compression bra from [employer] and to wear that regularly.  Home exercise Program Begin doing your exercises to improve your range of motion.  Follow up PT: It is recommended you return every 3 months for the first 3 years following surgery to be assessed on the SOZO machine for an L-Dex score. This helps prevent clinically significant lymphedema in 95% of patients. These follow up screens are 10 minute appointments that you are not billed for. WE ARE SCHEDULED TO MOVE TO [place] August 17, 2021. APPOINTMENTS FOR SOZO SCREENS AFTER 08/17/2021 WILL BE LOCATED AT [facility] AT [address] [place] [address]. Please call us to confirm we have moved if your appointment is scheduled after October 3rd, 2022. The phone number is [phone]. You are scheduled for September 26th at 3:10 pm.

## 2021-05-28 NOTE — Therapy (Signed)
[place], [place], [address] Phone: [phone]   Fax:  [phone]  Physical Therapy Treatment  Patient Details  Name: Sheila [name] MRN: [id number] Date of Birth: 28-Jan-1971 Referring Provider (PT): Dr Rolm Bookbinder   Encounter Date: 05/28/2021   PT End of Session - 05/28/21 1009     Visit Number 2    Number of Visits 10    Date for PT Re-Evaluation 06/25/21    Authorization Type [employer]    Authorization Time Period Gets 12 visits before Josem Kaufmann is required    Authorization - Visit Number 2    Authorization - Number of Visits 12    PT Start Time 0905    PT Stop Time 1008    PT Time Calculation (min) 63 min    Activity Tolerance Patient tolerated treatment well    Behavior During Therapy [facility] for tasks assessed/performed             Past Medical History:  Diagnosis Date   Anemia    Anginal pain ([place])    Breast cancer ([place])    Family history of bone cancer    Family history of breast cancer    Family history of prostate cancer    Family history of throat cancer    Malignant neoplasm of upper-outer quadrant of left breast in female, estrogen receptor positive ([place]) 04/08/2021    Past Surgical History:  Procedure Laterality Date   AXILLARY LYMPH NODE BIOPSY Left 04/29/2021   Procedure: AXILLARY LYMPH NODE BIOPSY;  Surgeon: Rolm Bookbinder, MD;  Location: [place];  Service: General;  Laterality: Left;   BREAST LUMPECTOMY WITH RADIOACTIVE SEED AND SENTINEL LYMPH NODE BIOPSY Left 04/29/2021   Procedure: LEFT BREAST LUMPECTOMY WITH RADIOACTIVE SEED;  Surgeon: Rolm Bookbinder, MD;  Location: [place];  Service: General;  Laterality: Left;   CARDIAC CATHETERIZATION     LEFT HEART CATHETERIZATION WITH CORONARY ANGIOGRAM N/A 05/30/2014   Procedure: LEFT HEART CATHETERIZATION WITH CORONARY ANGIOGRAM;  Surgeon: Peter M Martinique, MD;  Location: [facility] CATH LAB;  Service: Cardiovascular;   Laterality: N/A;   TUBAL LIGATION      There were no vitals filed for this visit.   Subjective Assessment - 05/28/21 0859     Subjective Patient reports she underwent a left lumpmectomy and sentinel node biopsy (1 negative node) on 04/29/2021. Her Oncotype score was 19 so no chemo  needed. She sees her radiation oncologist today.    Pertinent History Patient was diagnosed on 03/17/2021 with left grade II invasive ductal carcinoma breast cancer. She underwent a left lumpmectomy and sentinel node biopsy (1 negative node) on 04/29/2021 It is ER/PR positive and HER2 negative with a Ki67 of 15%.    Patient Stated Goals See how my arm is doing.    Currently in Pain? Yes    Pain Score 7     Pain Location Breast    Pain Orientation Left    Pain Descriptors / Indicators Other (Comment);Aching   fullness   Pain Type Surgical pain    Pain Onset In the past 7 days    Pain Frequency Intermittent    Aggravating Factors  Laying on left side    Pain Relieving Factors Nothing                [facility] PT Assessment - 05/28/21 0001       Assessment   Medical Diagnosis s/p left lumpectomy and SLNB    Referring Provider (  PT) Dr Rolm Bookbinder    Onset Date/Surgical Date 04/29/21    Hand Dominance Right    Prior Therapy Baselines      Precautions   Precautions Other (comment)    Precaution Comments recent surgery and left arm lymphedema      Restrictions   Weight Bearing Restrictions No      Balance Screen   Has the patient fallen in the past 6 months No    Has the patient had a decrease in activity level because of a fear of falling?  No    Is the patient reluctant to leave their home because of a fear of falling?  No      Home Social worker Private residence    Living Arrangements Spouse/significant other;Children   Husband and 35 y.o. son   Available Help at Discharge Family      Prior Function   Level of Independence Independent    Vocation Chartered loss adjuster a degree at [employer] She is not exercising      Cognition   Overall Cognitive Status Within Functional Limits for tasks assessed      Observation/Other Assessments   Observations Pt with visible and palpable edema present left lateral trunk just inferior to breast. Incision on left upper outer breast appears to be healing well with steri-strips still present. Mild edema present lateral trunk just inferior ot axilla.      Posture/Postural Control   Posture/Postural Control Postural limitations    Postural Limitations Rounded Shoulders;Forward head      ROM / Strength   AROM / PROM / Strength AROM      AROM   AROM Assessment Site Shoulder    Right/Left Shoulder Left    Left Shoulder Extension 32 Degrees    Left Shoulder Flexion 125 Degrees    Left Shoulder ABduction 136 Degrees    Left Shoulder Internal Rotation 52 Degrees    Left Shoulder External Rotation 82 Degrees               LYMPHEDEMA/ONCOLOGY QUESTIONNAIRE - 05/28/21 0001       Type   Cancer Type Left breast cancer      Surgeries   Lumpectomy Date 04/29/21    Sentinel Lymph Node Biopsy Date 04/29/21    Number Lymph Nodes Removed 1      Treatment   Active Chemotherapy Treatment No    Past Chemotherapy Treatment No    Active Radiation Treatment No    Past Radiation Treatment No    Current Hormone Treatment No    Past Hormone Therapy No      What other symptoms do you have   Are you Having Heaviness or Tightness Yes    Are you having Pain Yes    Are you having pitting edema No    Is it Hard or Difficult finding clothes that fit No    Do you have infections No    Is there Decreased scar mobility Yes    Stemmer Sign No      Lymphedema Assessments   Lymphedema Assessments Upper extremities      Right Upper Extremity Lymphedema   10 cm Proximal to Olecranon Process 33 cm    Olecranon Process 27.8 cm    10 cm Proximal to Ulnar Styloid Process 23.3 cm    Just Proximal to  Ulnar Styloid Process 15.7 cm    Across Hand at [employer]  Space 19.6 cm    At [place] of 2nd Digit 6.4 cm      Left Upper Extremity Lymphedema   10 cm Proximal to Olecranon Process 36 cm    Olecranon Process 27.5 cm    10 cm Proximal to Ulnar Styloid Process 23.3 cm    Just Proximal to Ulnar Styloid Process 15.7 cm    Across Hand at [employer] 19.7 cm    At [place] of 2nd Digit 6.4 cm                Quick Dash - 05/28/21 0001     Open a tight or new jar Mild difficulty    Do heavy household chores (wash walls, wash floors) Moderate difficulty    Carry a shopping bag or briefcase Mild difficulty    Wash your back Moderate difficulty    Use a knife to cut food Mild difficulty    Recreational activities in which you take some force or impact through your arm, shoulder, or hand (golf, hammering, tennis) No difficulty    During the past week, to what extent has your arm, shoulder or hand problem interfered with your normal social activities with family, friends, neighbors, or groups? Quite a bit    During the past week, to what extent has your arm, shoulder or hand problem limited your work or other regular daily activities Extremely    Arm, shoulder, or hand pain. Moderate    Tingling (pins and needles) in your arm, shoulder, or hand Moderate    Difficulty Sleeping Moderate difficulty    DASH Score 45.45 %                    [facility] Adult PT Treatment/Exercise - 05/28/21 0001       Manual Therapy   Manual Therapy Passive ROM;Neural Stretch    Passive ROM PROM left shoulder in supine flexion and abduction to pt tolerance x15 min    Neural Stretch Passive neural stretch left UE in supine with shoulder abduction, elbow extension, wrist extension - to pt tolerance                    PT Education - 05/28/21 1009     Education Details Aftercare; lymphedema risk reduction; HEP    Person(s) Educated Patient;Spouse    Methods Explanation;Demonstration;Handout     Comprehension Returned demonstration;Verbalized understanding                 PT Long Term Goals - 05/28/21 1014       PT LONG TERM GOAL #1   Title Patient will demonstrate she has regained full shoulder ROM and function post operatively compared to baselines.    Time 4    Period Weeks    Status On-going    Target Date 06/25/21      PT LONG TERM GOAL #2   Title Patient will increase left shoulder active flexion to >/= 150 degrees for increased ease reaching overhead.    Baseline 125 post op; 157 pre-op    Time 4    Period Weeks    Status New    Target Date 06/25/21      PT LONG TERM GOAL #3   Title Patient will increase left shoulder abduction to >/= 160 degrees for increased ease obtaining radiation positioning comfortably.    Baseline 136 post op; 170 pre-op    Time 4    Period Weeks    Status New  Target Date 06/25/21      PT LONG TERM GOAL #4   Title Patient will improve her DASH score to </= 18.18 for improved overall function of left arm.    Baseline 45.45 post op; 18.18 pre op    Time 4    Period Weeks    Status New    Target Date 06/25/21      PT LONG TERM GOAL #5   Title Patient will verbalize good understanding of lymphedema risk reduction practices    Baseline No knowledge    Time 4    Period Weeks    Status New    Target Date 06/25/21                   Plan - 05/28/21 1009     Clinical Impression Statement Patient is doing well s/p left lumpectomy and sentinel node biopsy on 04/29/2021 with 1 negative node removed. She has some edema present just inferior to her left breast and on her left lateral trunk which would benefit from manual lymph drainage. She is also lacking shoulder abduction and flexion ROM and seems to be mostly limited by pain. Pt has keloid scars on her chest from prior injury and reports she develops scars easily. She will benefit from PT to address these deficits and help her return to prior functional level and  tolerate radiation positioning.    PT Frequency 2x / week    PT Duration 4 weeks    PT Treatment/Interventions ADLs/Self Care Home Management;Therapeutic exercise;Patient/family education;Manual techniques;Manual lymph drainage;Passive range of motion;Scar mobilization    PT Next Visit Plan PROM left shoulder; passive neural stretching; manual lymph drainage focused on lateral trunk and area inferior to left breast; check to see if she got a compression bra    PT Home Exercise Plan Post op shoulder ROM HEP    Consulted and Agree with Plan of Care Patient;Family member/caregiver    Family Member Consulted Husband             Patient will benefit from skilled therapeutic intervention in order to improve the following deficits and impairments:  Decreased knowledge of precautions, Impaired UE functional use, Pain, Postural dysfunction, Decreased range of motion, Increased edema, Decreased scar mobility, Increased fascial restricitons  Visit Diagnosis: Malignant neoplasm of upper-outer quadrant of left breast in female, estrogen receptor positive ([place]) - Plan: PT plan of care cert/re-cert  Abnormal posture - Plan: PT plan of care cert/re-cert  Stiffness of left shoulder, not elsewhere classified - Plan: PT plan of care cert/re-cert  Localized edema - Plan: PT plan of care cert/re-cert  Aftercare following surgery for neoplasm - Plan: PT plan of care cert/re-cert     Problem List Patient Active Problem List   Diagnosis Date Noted   Genetic testing 04/21/2021   Family history of breast cancer    Family history of bone cancer    Family history of prostate cancer    Family history of throat cancer    Malignant neoplasm of upper-outer quadrant of left breast in female, estrogen receptor positive ([place]) 04/08/2021   Women's annual routine gynecological examination 02/25/2021   Screening examination for STD (sexually transmitted disease) 02/25/2021   Skin lesion 02/25/2021   Chest pain  at rest 05/30/2014   Ejection fraction    Abnormal EKG 04/24/2014   Chest discomfort 04/23/2014   Panic attack 04/23/2014   OBESITY 01/15/2009   HAIR LOSS 01/15/2009   ANEMIA-NOS 01/14/2009   Annia Friendly,  PT 05/28/21 10:20 AM   [place], [place], [address] Phone: [phone]   Fax:  [phone]  Name: SHANINA KEPPLE MRN: [id number] Date of Birth: 14-May-1971

## 2021-05-28 NOTE — Progress Notes (Signed)
Patient in for consult for right breast cancer. Has some pain in breast area. Is going to PT and was told her breat is swollen. Discussed her CT with her.

## 2021-06-02 ENCOUNTER — Encounter: Payer: Self-pay | Admitting: Internal Medicine

## 2021-06-02 ENCOUNTER — Ambulatory Visit (INDEPENDENT_AMBULATORY_CARE_PROVIDER_SITE_OTHER): Payer: Self-pay | Admitting: Internal Medicine

## 2021-06-02 ENCOUNTER — Other Ambulatory Visit (HOSPITAL_COMMUNITY)
Admission: RE | Admit: 2021-06-02 | Discharge: 2021-06-02 | Disposition: A | Discharge status: Home / Self Care | Payer: Medicaid Other | Source: Ambulatory Visit | Attending: Obstetrics and Gynecology | Admitting: Obstetrics and Gynecology

## 2021-06-02 ENCOUNTER — Other Ambulatory Visit: Payer: Self-pay

## 2021-06-02 ENCOUNTER — Ambulatory Visit: Payer: Medicaid Other | Admitting: Obstetrics and Gynecology

## 2021-06-02 ENCOUNTER — Ambulatory Visit (INDEPENDENT_AMBULATORY_CARE_PROVIDER_SITE_OTHER): Payer: Medicaid Other | Admitting: Obstetrics and Gynecology

## 2021-06-02 ENCOUNTER — Encounter: Payer: Self-pay | Admitting: Obstetrics and Gynecology

## 2021-06-02 VITALS — BP 124/84 | HR 86 | Ht 69.0 in | Wt 207.5 lb

## 2021-06-02 DIAGNOSIS — Z1211 Encounter for screening for malignant neoplasm of colon: Secondary | ICD-10-CM

## 2021-06-02 DIAGNOSIS — Z01812 Encounter for preprocedural laboratory examination: Secondary | ICD-10-CM

## 2021-06-02 DIAGNOSIS — N950 Postmenopausal bleeding: Secondary | ICD-10-CM | POA: Diagnosis present

## 2021-06-02 DIAGNOSIS — M533 Sacrococcygeal disorders, not elsewhere classified: Secondary | ICD-10-CM

## 2021-06-02 DIAGNOSIS — Z510 Encounter for antineoplastic radiation therapy: Secondary | ICD-10-CM | POA: Diagnosis not present

## 2021-06-02 LAB — POCT URINE PREGNANCY: Preg Test, Ur: NEGATIVE

## 2021-06-02 NOTE — Progress Notes (Signed)
ENDOMETRIAL BIOPSY      Sheila [name] is a 50 y.o. [id number] here for endometrial biopsy.  The indications for endometrial biopsy were reviewed.  Risks of the biopsy including cramping, bleeding, infection, uterine perforation, inadequate specimen and need for additional procedures were discussed. The patient states she understands and agrees to undergo procedure today. Consent was signed. Time out was performed.   Pt notes bleeding stopped yesterday.  She had bleeding from 7/5-present and did pass clots.  Recent ultrasound showed 4 mm stripe and no other pathology.  Pt has not received chemotherapy or radiation for her recently treated breast cancer at this time.  Indications: postmenopausal bleeding Urine HCG: negative  A bivalve speculum was placed into the vagina and the cervix was easily visualized and was prepped with Betadine x2. A single-toothed tenaculum was placed on the anterior lip of the cervix to stabilize it. The 3 mm pipelle was introduced into the endometrial cavity without difficulty to a depth of 8.4 cm, and a moderate amount of tissue was obtained and sent to pathology. This was repeated for a total of 3 passes. The instruments were removed from the patient's vagina. Minimal bleeding from the cervix at the tenaculum was noted.   The patient tolerated the procedure well. Routine post-procedure instructions were given to the patient.    Will base further management on results of biopsy.  Lynnda Shields, MD

## 2021-06-03 ENCOUNTER — Encounter: Payer: Self-pay | Admitting: Internal Medicine

## 2021-06-03 ENCOUNTER — Encounter: Payer: Self-pay | Admitting: *Deleted

## 2021-06-03 NOTE — Progress Notes (Signed)
HISTORY OF PRESENT ILLNESS:  Sheila [name] is a 50 y.o. female, new to this practice, who was sent today by her gynecologist regarding problems with pain in the region of the tailbone.  For this problem she was evaluated in the emergency room Apr 05, 2021.  Evaluation was unremarkable including a CT scan of the abdomen and pelvis.  I have reviewed that encounter.  I have reviewed the x-rays.  Prior to that evaluation she was diagnosed with stage II left breast cancer.  For this, she underwent surgery (left breast radioactive seed lumpectomy and left deep axillary sentinel lymph node biopsy) with Dr. Donne Hazel.  Upon recovering from surgery, she tells me that her tailbone pain resolved and has not returned.  She is to start radiation therapy today.  Her GI review of systems is quite unremarkable.  She has not had screening colonoscopy, but is interested.  She is accompanied by her daughter today, who works in cardiology.  There is no obvious family history of colon cancer..  Review of blood work from May 19, 2021 shows unremarkable comprehensive metabolic panel.  Unremarkable CBC with hemoglobin 12.7.  REVIEW OF SYSTEMS:  All non-GI ROS negative unless otherwise stated in the HPI.  Past Medical History:  Diagnosis Date   Anemia    Anginal pain ([place])    Breast cancer ([place])    Family history of bone cancer    Family history of breast cancer    Family history of prostate cancer    Family history of throat cancer    Malignant neoplasm of upper-outer quadrant of left breast in female, estrogen receptor positive ([place]) 04/08/2021    Past Surgical History:  Procedure Laterality Date   AXILLARY LYMPH NODE BIOPSY Left 04/29/2021   Procedure: AXILLARY LYMPH NODE BIOPSY;  Surgeon: Rolm Bookbinder, MD;  Location: [place];  Service: General;  Laterality: Left;   BREAST LUMPECTOMY WITH RADIOACTIVE SEED AND SENTINEL LYMPH NODE BIOPSY Left 04/29/2021   Procedure: LEFT BREAST LUMPECTOMY WITH RADIOACTIVE SEED;   Surgeon: Rolm Bookbinder, MD;  Location: [place];  Service: General;  Laterality: Left;   CARDIAC CATHETERIZATION     LEFT HEART CATHETERIZATION WITH CORONARY ANGIOGRAM N/A 05/30/2014   Procedure: LEFT HEART CATHETERIZATION WITH CORONARY ANGIOGRAM;  Surgeon: Peter M Martinique, MD;  Location: [facility] CATH LAB;  Service: Cardiovascular;  Laterality: N/A;   TUBAL LIGATION      Social History Sheila [name]  reports that she has never smoked. She has never used smokeless tobacco. She reports that she does not drink alcohol and does not use drugs.  family history includes Bone cancer (age of onset: 1) in her nephew; Breast cancer in her cousin and other family members; Breast cancer (age of onset: 2) in her mother; Breast cancer (age of onset: 39) in her maternal grandmother; Cancer in her paternal aunt, paternal uncle, paternal uncle, and other family members; Colon cancer in her maternal uncle; Crohn's disease in her brother and sister; Diabetes in her father; Heart Problems in her mother; Heart disease in her mother and sister; Hyperlipidemia in her mother; Hypertension in her maternal aunt, maternal grandmother, maternal uncle, and mother; Pancreatic cancer in her cousin; Prostate cancer in her maternal uncle, maternal uncle and another family member; Seizures in her father; Sickle cell anemia in an other family member; Stroke in her maternal grandfather and maternal grandmother; Throat cancer in her paternal uncle.  No Known Allergies     PHYSICAL EXAMINATION: Vital signs: BP 124/84 (BP Location:  Right Arm, Patient Position: Sitting, Cuff Size: Normal)   Pulse 86   Ht 5\' 9"  (1.753 m)   Wt 207 lb 8 oz (94.1 kg)   LMP 04/03/2018 (Within Days)   SpO2 99%   BMI 30.64 kg/m   Constitutional: generally well-appearing, no acute distress Psychiatric: alert and oriented x3, cooperative Eyes: extraocular movements intact, anicteric, conjunctiva pink Mouth: Mask Neck: supple no  lymphadenopathy Cardiovascular: heart regular rate and rhythm, no murmur Lungs: clear to auscultation bilaterally Abdomen: soft, nontender, nondistended, no obvious ascites, no peritoneal signs, normal bowel sounds, no organomegaly Rectal: Deferred until colonoscopy Extremities: no clubbing, cyanosis, or lower extremity edema bilaterally Skin: no lesions on visible extremities Neuro: No focal deficits.  Cranial nerves intact  ASSESSMENT:  1.  Transient tailbone pain.  Negative work-up including advanced imaging. 2.  Recently diagnosed breast cancer.  About to start radiation therapy 3.  Colon cancer screening.  Baseline risk.  Interested in optical colonoscopy   PLAN:  1.  Screening colonoscopy.The nature of the procedure, as well as the risks, benefits, and alternatives were carefully and thoroughly reviewed with the patient. Ample time for discussion and questions allowed. The patient understood, was satisfied, and agreed to proceed.

## 2021-06-04 ENCOUNTER — Telehealth: Payer: Self-pay | Admitting: Hematology and Oncology

## 2021-06-04 ENCOUNTER — Ambulatory Visit
Admission: RE | Admit: 2021-06-04 | Discharge: 2021-06-04 | Disposition: A | Discharge status: Home / Self Care | Payer: Medicaid Other | Source: Ambulatory Visit | Attending: Radiation Oncology | Admitting: Radiation Oncology

## 2021-06-04 ENCOUNTER — Other Ambulatory Visit: Payer: Self-pay

## 2021-06-04 DIAGNOSIS — Z510 Encounter for antineoplastic radiation therapy: Secondary | ICD-10-CM | POA: Diagnosis not present

## 2021-06-04 LAB — SURGICAL PATHOLOGY

## 2021-06-04 NOTE — Progress Notes (Signed)
Pt here for patient teaching.  Pt given Radiation and You booklet, skin care instructions, Alra deodorant, and Radiaplex gel.  Reviewed areas of pertinence such as fatigue, hair loss, skin changes, breast tenderness, and breast swelling . Pt able to give teach back of to pat skin and use unscented/gentle soap,apply Radiaplex bid, avoid applying anything to skin within 4 hours of treatment, avoid wearing an under wire bra, and to use an electric razor if they must shave. Pt verbalizes understanding of information given and will contact nursing with any questions or concerns.     [email or web address]/treatmentinformation/whattoexpect/index  Alivya Wegman M. Jameela Michna RN, BSN       

## 2021-06-04 NOTE — Telephone Encounter (Signed)
Scheduled appointment per 07/21 sch msg. Patient is aware. 

## 2021-06-05 ENCOUNTER — Ambulatory Visit
Admission: RE | Admit: 2021-06-05 | Discharge: 2021-06-05 | Disposition: A | Discharge status: Home / Self Care | Payer: Medicaid Other | Source: Ambulatory Visit | Attending: Radiation Oncology | Admitting: Radiation Oncology

## 2021-06-05 DIAGNOSIS — Z510 Encounter for antineoplastic radiation therapy: Secondary | ICD-10-CM | POA: Diagnosis not present

## 2021-06-05 DIAGNOSIS — Z170 Estrogen receptor positive status [ER+]: Secondary | ICD-10-CM

## 2021-06-05 MED ORDER — ALRA NON-METALLIC DEODORANT (RAD-ONC)
1.0000 | Freq: Once | TOPICAL | Status: DC
Start: 2021-06-05 — End: 2021-06-06

## 2021-06-05 MED ORDER — RADIAPLEXRX EX GEL
Freq: Once | CUTANEOUS | Status: DC
Start: 2021-06-05 — End: 2021-06-06

## 2021-06-08 ENCOUNTER — Encounter: Payer: Self-pay | Admitting: Rehabilitation

## 2021-06-08 ENCOUNTER — Ambulatory Visit
Admission: RE | Admit: 2021-06-08 | Discharge: 2021-06-08 | Disposition: A | Discharge status: Home / Self Care | Payer: Medicaid Other | Source: Ambulatory Visit | Attending: Radiation Oncology | Admitting: Radiation Oncology

## 2021-06-08 ENCOUNTER — Ambulatory Visit: Payer: Medicaid Other | Admitting: Rehabilitation

## 2021-06-08 ENCOUNTER — Other Ambulatory Visit: Payer: Self-pay

## 2021-06-08 DIAGNOSIS — R600 Localized edema: Secondary | ICD-10-CM

## 2021-06-08 DIAGNOSIS — Z483 Aftercare following surgery for neoplasm: Secondary | ICD-10-CM

## 2021-06-08 DIAGNOSIS — Z170 Estrogen receptor positive status [ER+]: Secondary | ICD-10-CM

## 2021-06-08 DIAGNOSIS — M25612 Stiffness of left shoulder, not elsewhere classified: Secondary | ICD-10-CM

## 2021-06-08 DIAGNOSIS — Z510 Encounter for antineoplastic radiation therapy: Secondary | ICD-10-CM | POA: Diagnosis not present

## 2021-06-08 DIAGNOSIS — R293 Abnormal posture: Secondary | ICD-10-CM

## 2021-06-08 MED ORDER — SUPREP BOWEL PREP KIT 17.5-3.13-1.6 GM/177ML PO SOLN: ORAL | 0 refills | Status: DC

## 2021-06-08 NOTE — Therapy (Signed)
[place], [place], [address] Phone: [phone]   Fax:  [phone]  Physical Therapy Treatment  Patient Details  Name: Sheila [name] MRN: [id number] Date of Birth: 1971-02-21 Referring Provider (PT): Dr Rolm Bookbinder   Encounter Date: 06/08/2021   PT End of Session - 06/08/21 1351     Visit Number 3    Number of Visits 10    Date for PT Re-Evaluation 06/25/21    Authorization - Visit Number 3    PT Start Time 1320   arrived late   PT Stop Time 1350   has to leave for radiation   PT Time Calculation (min) 30 min    Activity Tolerance Patient tolerated treatment well             Past Medical History:  Diagnosis Date   Anemia    Anginal pain ([place])    Breast cancer ([place])    Family history of bone cancer    Family history of breast cancer    Family history of prostate cancer    Family history of throat cancer    Malignant neoplasm of upper-outer quadrant of left breast in female, estrogen receptor positive ([place]) 04/08/2021    Past Surgical History:  Procedure Laterality Date   AXILLARY LYMPH NODE BIOPSY Left 04/29/2021   Procedure: AXILLARY LYMPH NODE BIOPSY;  Surgeon: Rolm Bookbinder, MD;  Location: [place];  Service: General;  Laterality: Left;   BREAST LUMPECTOMY WITH RADIOACTIVE SEED AND SENTINEL LYMPH NODE BIOPSY Left 04/29/2021   Procedure: LEFT BREAST LUMPECTOMY WITH RADIOACTIVE SEED;  Surgeon: Rolm Bookbinder, MD;  Location: [place];  Service: General;  Laterality: Left;   CARDIAC CATHETERIZATION     LEFT HEART CATHETERIZATION WITH CORONARY ANGIOGRAM N/A 05/30/2014   Procedure: LEFT HEART CATHETERIZATION WITH CORONARY ANGIOGRAM;  Surgeon: Peter M Martinique, MD;  Location: [facility] CATH LAB;  Service: Cardiovascular;  Laterality: N/A;   TUBAL LIGATION      There were no vitals filed for this visit.   Subjective Assessment - 06/08/21 1321     Subjective I can get my arm up for  radiation    Pertinent History Patient was diagnosed on 03/17/2021 with left grade II invasive ductal carcinoma breast cancer. She underwent a left lumpmectomy and sentinel node biopsy (1 negative node) on 04/29/2021 It is ER/PR positive and HER2 negative with a Ki67 of 15%.    Currently in Pain? No/denies                [facility] PT Assessment - 06/08/21 0001       Observation/Other Assessments   Observations still with steristips present      AROM   Left Shoulder Flexion 152 Degrees   pull anterior chest   Left Shoulder ABduction 136 Degrees   pain top of the shoulder                          [facility] Adult PT Treatment/Exercise - 06/08/21 0001       Exercises   Exercises Shoulder      Shoulder Exercises: Seated   Other Seated Exercises mermaid stretch 5x5seconds      Shoulder Exercises: Pulleys   Flexion 2 minutes    Flexion Limitations with initial cueing    ABduction 2 minutes    ABduction Limitations with initial cueing      Shoulder Exercises: Therapy Ball   Flexion Both;5 reps  Manual Therapy   Manual Therapy Passive ROM;Manual Lymphatic Drainage (MLD)    Manual Lymphatic Drainage (MLD) incorporated lateral trunk with PROM    Passive ROM PROM left shoulder in supine flexion and abduction to pt tolerance x15 min                         PT Long Term Goals - 05/28/21 1014       PT LONG TERM GOAL #1   Title Patient will demonstrate she has regained full shoulder ROM and function post operatively compared to baselines.    Time 4    Period Weeks    Status On-going    Target Date 06/25/21      PT LONG TERM GOAL #2   Title Patient will increase left shoulder active flexion to >/= 150 degrees for increased ease reaching overhead.    Baseline 125 post op; 157 pre-op    Time 4    Period Weeks    Status New    Target Date 06/25/21      PT LONG TERM GOAL #3   Title Patient will increase left shoulder abduction to >/= 160 degrees  for increased ease obtaining radiation positioning comfortably.    Baseline 136 post op; 170 pre-op    Time 4    Period Weeks    Status New    Target Date 06/25/21      PT LONG TERM GOAL #4   Title Patient will improve her DASH score to </= 18.18 for improved overall function of left arm.    Baseline 45.45 post op; 18.18 pre op    Time 4    Period Weeks    Status New    Target Date 06/25/21      PT LONG TERM GOAL #5   Title Patient will verbalize good understanding of lymphedema risk reduction practices    Baseline No knowledge    Time 4    Period Weeks    Status New    Target Date 06/25/21                   Plan - 06/08/21 1352     Clinical Impression Statement Short seession today but started on AAROM, stretches, PROM, and lateral trunk MLD.  Encouraged pt to wear bra as long as possible into radiation and that 2nd to nature has tanks    PT Frequency 2x / week    PT Duration 4 weeks    PT Treatment/Interventions ADLs/Self Care Home Management;Therapeutic exercise;Patient/family education;Manual techniques;Manual lymph drainage;Passive range of motion;Scar mobilization    PT Next Visit Plan PROM left shoulder; passive neural stretching; manual lymph drainage focused on lateral trunk and area inferior to left breast;             Patient will benefit from skilled therapeutic intervention in order to improve the following deficits and impairments:  Decreased knowledge of precautions, Impaired UE functional use, Pain, Postural dysfunction, Decreased range of motion, Increased edema, Decreased scar mobility, Increased fascial restricitons  Visit Diagnosis: Malignant neoplasm of upper-outer quadrant of left breast in female, estrogen receptor positive (HCC)  Abnormal posture  Stiffness of left shoulder, not elsewhere classified  Localized edema  Aftercare following surgery for neoplasm     Problem List Patient Active Problem List   Diagnosis Date Noted    Postmenopausal bleeding 06/02/2021   Genetic testing 04/21/2021   Family history of breast cancer    Family history  of bone cancer    Family history of prostate cancer    Family history of throat cancer    Malignant neoplasm of upper-outer quadrant of left breast in female, estrogen receptor positive ([place]) 04/08/2021   Women's annual routine gynecological examination 02/25/2021   Screening examination for STD (sexually transmitted disease) 02/25/2021   Skin lesion 02/25/2021   Chest pain at rest 05/30/2014   Ejection fraction    Abnormal EKG 04/24/2014   Chest discomfort 04/23/2014   Panic attack 04/23/2014   OBESITY 01/15/2009   HAIR LOSS 01/15/2009   ANEMIA-NOS 01/14/2009    Stark Bray 06/08/2021, 1:54 PM  [place], [place], [address] Phone: [phone]   Fax:  [phone]  Name: SAMEKA BAGENT MRN: [id number] Date of Birth: Apr 09, 1971

## 2021-06-08 NOTE — Patient Instructions (Signed)
It was my pleasure to provide care to you today. Based on our discussion, I am providing you with my recommendations below:  RECOMMENDATION(S):   COLONOSCOPY:   You have been scheduled for a colonoscopy. Please follow written instructions given to you at your visit today.   PREP:   Please pick up your prep supplies at the pharmacy within the next 1-3 days.  INHALERS:   If you use inhalers (even only as needed), please bring them with you on the day of your procedure.  COLONOSCOPY TIPS:  To reduce nausea and dehydration, stay well hydrated for 3-4 days prior to the exam.  To prevent skin/hemorrhoid irritation - prior to wiping, put A&Dointment or vaseline on the toilet paper. Keep a towel or pad on the bed.  BEFORE STARTING YOUR PREP, drink  64oz of clear liquids in the morning. This will help to flush the colon and will ensure you are well hydrated!!!!  NOTE - This is in addition to the fluids required for to complete your prep. Use of a flavored hard candy, such as grape Anise Salvo, can counteract some of the flavor of the prep and may prevent some nausea.   FOLLOW UP:  After your procedure, you will receive a call from my office staff regarding my recommendation for follow up.  BMI:  If you are age 38 or younger, your body mass index should be between 19-25. Your Body mass index is 30.64 kg/m. If this is out of the aformentioned range listed, please consider follow up with your Primary Care Provider.   MY CHART:  The [place] [facility] providers would like to encourage you to use [facility] to communicate with providers for non-urgent requests or questions.  Due to long hold times on the telephone, sending your provider a message by [facility] may be a faster and more efficient way to get a response.  Please allow 48 business hours for a response.  Please remember that this is for non-urgent requests.   Thank you for trusting me with your gastrointestinal care!    Scarlette Shorts, MD

## 2021-06-08 NOTE — Addendum Note (Signed)
Addended by: Hardie Pulley, [place] J on: 06/08/2021 01:57 PM   Modules accepted: Orders

## 2021-06-09 ENCOUNTER — Ambulatory Visit
Admission: RE | Admit: 2021-06-09 | Discharge: 2021-06-09 | Disposition: A | Discharge status: Home / Self Care | Payer: Medicaid Other | Source: Ambulatory Visit | Attending: Radiation Oncology | Admitting: Radiation Oncology

## 2021-06-09 DIAGNOSIS — Z510 Encounter for antineoplastic radiation therapy: Secondary | ICD-10-CM | POA: Diagnosis not present

## 2021-06-10 ENCOUNTER — Other Ambulatory Visit: Payer: Self-pay

## 2021-06-10 ENCOUNTER — Ambulatory Visit: Admission: RE | Admit: 2021-06-10 | Payer: Medicaid Other | Source: Ambulatory Visit

## 2021-06-11 ENCOUNTER — Ambulatory Visit
Admission: RE | Admit: 2021-06-11 | Discharge: 2021-06-11 | Disposition: A | Discharge status: Home / Self Care | Payer: Medicaid Other | Source: Ambulatory Visit | Attending: Radiation Oncology | Admitting: Radiation Oncology

## 2021-06-11 DIAGNOSIS — Z510 Encounter for antineoplastic radiation therapy: Secondary | ICD-10-CM | POA: Diagnosis not present

## 2021-06-12 ENCOUNTER — Other Ambulatory Visit: Payer: Self-pay

## 2021-06-12 ENCOUNTER — Other Ambulatory Visit: Payer: Self-pay | Admitting: Radiation Oncology

## 2021-06-12 ENCOUNTER — Ambulatory Visit
Admission: RE | Admit: 2021-06-12 | Discharge: 2021-06-12 | Disposition: A | Discharge status: Home / Self Care | Payer: Medicaid Other | Source: Ambulatory Visit | Attending: Radiation Oncology | Admitting: Radiation Oncology

## 2021-06-12 DIAGNOSIS — Z510 Encounter for antineoplastic radiation therapy: Secondary | ICD-10-CM | POA: Diagnosis not present

## 2021-06-12 MED ORDER — PROCHLORPERAZINE MALEATE 10 MG PO TABS: 10.0000 mg | ORAL_TABLET | Freq: Four times a day (QID) | ORAL | 0 refills | Status: DC | PRN

## 2021-06-15 ENCOUNTER — Ambulatory Visit
Admission: RE | Admit: 2021-06-15 | Discharge: 2021-06-15 | Disposition: A | Discharge status: Home / Self Care | Payer: Medicaid Other | Source: Ambulatory Visit | Attending: Radiation Oncology | Admitting: Radiation Oncology

## 2021-06-15 ENCOUNTER — Ambulatory Visit: Payer: Medicaid Other | Admitting: Rehabilitation

## 2021-06-15 ENCOUNTER — Encounter: Payer: Self-pay | Admitting: Rehabilitation

## 2021-06-15 ENCOUNTER — Other Ambulatory Visit: Payer: Self-pay

## 2021-06-15 DIAGNOSIS — R293 Abnormal posture: Secondary | ICD-10-CM | POA: Diagnosis not present

## 2021-06-15 DIAGNOSIS — K769 Liver disease, unspecified: Secondary | ICD-10-CM | POA: Diagnosis not present

## 2021-06-15 DIAGNOSIS — Z170 Estrogen receptor positive status [ER+]: Secondary | ICD-10-CM | POA: Insufficient documentation

## 2021-06-15 DIAGNOSIS — R600 Localized edema: Secondary | ICD-10-CM | POA: Diagnosis not present

## 2021-06-15 DIAGNOSIS — M25612 Stiffness of left shoulder, not elsewhere classified: Secondary | ICD-10-CM | POA: Insufficient documentation

## 2021-06-15 DIAGNOSIS — Z483 Aftercare following surgery for neoplasm: Secondary | ICD-10-CM | POA: Insufficient documentation

## 2021-06-15 DIAGNOSIS — C50412 Malignant neoplasm of upper-outer quadrant of left female breast: Secondary | ICD-10-CM | POA: Insufficient documentation

## 2021-06-15 DIAGNOSIS — Z510 Encounter for antineoplastic radiation therapy: Secondary | ICD-10-CM | POA: Insufficient documentation

## 2021-06-15 NOTE — Therapy (Signed)
[place], [place], [address] Phone: [phone]   Fax:  [phone]  Physical Therapy Treatment  Patient Details  Name: Sheila [name] MRN: [id number] Date of Birth: [birth year] Referring Provider (PT): Dr Rolm Bookbinder   Encounter Date: 06/15/2021   PT End of Session - 06/15/21 1036     Visit Number 4    Number of Visits 10    Date for PT Re-Evaluation 06/25/21    Authorization - Visit Number 4    PT Start Time 0945    PT Stop Time 2751    PT Time Calculation (min) 50 min    Activity Tolerance Patient tolerated treatment well    Behavior During Therapy [facility] for tasks assessed/performed             Past Medical History:  Diagnosis Date   Anemia    Anginal pain ([place])    Breast cancer ([place])    Family history of bone cancer    Family history of breast cancer    Family history of prostate cancer    Family history of throat cancer    Malignant neoplasm of upper-outer quadrant of left breast in female, estrogen receptor positive ([place]) 04/08/2021    Past Surgical History:  Procedure Laterality Date   AXILLARY LYMPH NODE BIOPSY Left 04/29/2021   Procedure: AXILLARY LYMPH NODE BIOPSY;  Surgeon: Rolm Bookbinder, MD;  Location: [place];  Service: General;  Laterality: Left;   BREAST LUMPECTOMY WITH RADIOACTIVE SEED AND SENTINEL LYMPH NODE BIOPSY Left 04/29/2021   Procedure: LEFT BREAST LUMPECTOMY WITH RADIOACTIVE SEED;  Surgeon: Rolm Bookbinder, MD;  Location: [place];  Service: General;  Laterality: Left;   CARDIAC CATHETERIZATION     LEFT HEART CATHETERIZATION WITH CORONARY ANGIOGRAM N/A 05/30/2014   Procedure: LEFT HEART CATHETERIZATION WITH CORONARY ANGIOGRAM;  Surgeon: Peter M Martinique, MD;  Location: [facility] CATH LAB;  Service: Cardiovascular;  Laterality: N/A;   TUBAL LIGATION      There were no vitals filed for this visit.   Subjective Assessment - 06/15/21 0945     Subjective My left  elbow and knee have been hurting since last night    Pertinent History Patient was diagnosed on 03/17/2021 with left grade II invasive ductal carcinoma breast cancer. She underwent a left lumpmectomy and sentinel node biopsy (1 negative node) on 04/29/2021 It is ER/PR positive and HER2 negative with a Ki67 of 15%.    Currently in Pain? Yes    Pain Score 5     Pain Location Knee    Pain Orientation Left    Pain Descriptors / Indicators Sore    Pain Type Acute pain    Pain Onset Yesterday    Pain Frequency Constant    Aggravating Factors  stepping on it    Pain Relieving Factors elevation, alcohol                               [facility] Adult PT Treatment/Exercise - 06/15/21 0001       Shoulder Exercises: Pulleys   Flexion 2 minutes    ABduction 2 minutes      Shoulder Exercises: Therapy Ball   Flexion Both;5 reps      Shoulder Exercises: ROM/Strengthening   Other ROM/Strengthening Exercises posterior shoulder rolls x 10, retraction x 10      Manual Therapy   Manual Lymphatic Drainage (MLD) short neck, superficial and deep abdominals,  anterior interaxillary pathway, left axillo inguinal pathway, then left breast mostly lateral and superior and left upper arm and then repeating all steps    Passive ROM PROM left shoulder in supine flexion and abduction to pt tolerance x15 min    Neural Stretch Passive neural stretch left UE in supine with shoulder abduction, elbow extension, wrist extension - to pt tolerance                         PT Long Term Goals - 05/28/21 1014       PT LONG TERM GOAL #1   Title Patient will demonstrate she has regained full shoulder ROM and function post operatively compared to baselines.    Time 4    Period Weeks    Status On-going    Target Date 06/25/21      PT LONG TERM GOAL #2   Title Patient will increase left shoulder active flexion to >/= 150 degrees for increased ease reaching overhead.    Baseline 125 post op;  157 pre-op    Time 4    Period Weeks    Status New    Target Date 06/25/21      PT LONG TERM GOAL #3   Title Patient will increase left shoulder abduction to >/= 160 degrees for increased ease obtaining radiation positioning comfortably.    Baseline 136 post op; 170 pre-op    Time 4    Period Weeks    Status New    Target Date 06/25/21      PT LONG TERM GOAL #4   Title Patient will improve her DASH score to </= 18.18 for improved overall function of left arm.    Baseline 45.45 post op; 18.18 pre op    Time 4    Period Weeks    Status New    Target Date 06/25/21      PT LONG TERM GOAL #5   Title Patient will verbalize good understanding of lymphedema risk reduction practices    Baseline No knowledge    Time 4    Period Weeks    Status New    Target Date 06/25/21                   Plan - 06/15/21 1036     Clinical Impression Statement Pt experiencing some new left elbow and knee pain for unknown reason.  Tolerated treatment well.  MLD to the lateral and anterior breast and upper arm to decrease tightness feelings post PROM.  Unable to do STM due to radiation this afternoon.    PT Frequency 2x / week    PT Duration 4 weeks    PT Treatment/Interventions ADLs/Self Care Home Management;Therapeutic exercise;Patient/family education;Manual techniques;Manual lymph drainage;Passive range of motion;Scar mobilization    PT Next Visit Plan PROM left shoulder; passive neural stretching; manual lymph drainage focused on lateral trunk and area inferior to left breast; try to add standing band TE             Patient will benefit from skilled therapeutic intervention in order to improve the following deficits and impairments:     Visit Diagnosis: Malignant neoplasm of upper-outer quadrant of left breast in female, estrogen receptor positive ([facility]CC)  Abnormal posture  Stiffness of left shoulder, not elsewhere classified  Localized edema  Aftercare following surgery for  neoplasm     Problem List Patient Active Problem List   Diagnosis Date Noted   Postmenopausal  bleeding 06/02/2021   Genetic testing 04/21/2021   Family history of breast cancer    Family history of bone cancer    Family history of prostate cancer    Family history of throat cancer    Malignant neoplasm of upper-outer quadrant of left breast in female, estrogen receptor positive ([place]) 04/08/2021   Women's annual routine gynecological examination 02/25/2021   Screening examination for STD (sexually transmitted disease) 02/25/2021   Skin lesion 02/25/2021   Chest pain at rest 05/30/2014   Ejection fraction    Abnormal EKG 04/24/2014   Chest discomfort 04/23/2014   Panic attack 04/23/2014   OBESITY 01/15/2009   HAIR LOSS 01/15/2009   ANEMIA-NOS 01/14/2009    Stark Bray 06/15/2021, 10:38 AM  [place], [place], [address] Phone: [phone]   Fax:  [phone]  Name: Sheila [name] MRN: [id number] Date of Birth: Jun 13, 1971

## 2021-06-16 ENCOUNTER — Ambulatory Visit
Admission: RE | Admit: 2021-06-16 | Discharge: 2021-06-16 | Disposition: A | Discharge status: Home / Self Care | Payer: Medicaid Other | Source: Ambulatory Visit | Attending: Radiation Oncology | Admitting: Radiation Oncology

## 2021-06-16 DIAGNOSIS — Z510 Encounter for antineoplastic radiation therapy: Secondary | ICD-10-CM | POA: Diagnosis not present

## 2021-06-17 ENCOUNTER — Other Ambulatory Visit: Payer: Self-pay

## 2021-06-17 ENCOUNTER — Ambulatory Visit
Admission: RE | Admit: 2021-06-17 | Discharge: 2021-06-17 | Disposition: A | Discharge status: Home / Self Care | Payer: Medicaid Other | Source: Ambulatory Visit | Attending: Radiation Oncology | Admitting: Radiation Oncology

## 2021-06-17 DIAGNOSIS — Z510 Encounter for antineoplastic radiation therapy: Secondary | ICD-10-CM | POA: Diagnosis not present

## 2021-06-18 ENCOUNTER — Ambulatory Visit: Payer: Medicaid Other

## 2021-06-18 ENCOUNTER — Ambulatory Visit
Admission: RE | Admit: 2021-06-18 | Discharge: 2021-06-18 | Disposition: A | Discharge status: Home / Self Care | Payer: Medicaid Other | Source: Ambulatory Visit | Attending: Radiation Oncology | Admitting: Radiation Oncology

## 2021-06-18 DIAGNOSIS — Z510 Encounter for antineoplastic radiation therapy: Secondary | ICD-10-CM | POA: Diagnosis not present

## 2021-06-19 ENCOUNTER — Ambulatory Visit: Payer: Medicaid Other | Admitting: Radiation Oncology

## 2021-06-19 ENCOUNTER — Ambulatory Visit
Admission: RE | Admit: 2021-06-19 | Discharge: 2021-06-19 | Disposition: A | Discharge status: Home / Self Care | Payer: Medicaid Other | Source: Ambulatory Visit | Attending: Radiation Oncology | Admitting: Radiation Oncology

## 2021-06-19 DIAGNOSIS — Z510 Encounter for antineoplastic radiation therapy: Secondary | ICD-10-CM | POA: Diagnosis not present

## 2021-06-22 ENCOUNTER — Encounter: Payer: Medicaid Other | Admitting: Rehabilitation

## 2021-06-22 ENCOUNTER — Ambulatory Visit: Payer: Medicaid Other

## 2021-06-23 ENCOUNTER — Other Ambulatory Visit: Payer: Self-pay

## 2021-06-23 ENCOUNTER — Ambulatory Visit
Admission: RE | Admit: 2021-06-23 | Discharge: 2021-06-23 | Disposition: A | Discharge status: Home / Self Care | Payer: Medicaid Other | Source: Ambulatory Visit | Attending: Radiation Oncology | Admitting: Radiation Oncology

## 2021-06-23 ENCOUNTER — Encounter: Payer: Self-pay | Admitting: Radiation Oncology

## 2021-06-23 DIAGNOSIS — Z510 Encounter for antineoplastic radiation therapy: Secondary | ICD-10-CM | POA: Diagnosis not present

## 2021-06-24 ENCOUNTER — Encounter: Payer: Medicaid Other | Admitting: Rehabilitation

## 2021-06-24 ENCOUNTER — Ambulatory Visit
Admission: RE | Admit: 2021-06-24 | Discharge: 2021-06-24 | Disposition: A | Discharge status: Home / Self Care | Payer: Medicaid Other | Source: Ambulatory Visit | Attending: Radiation Oncology | Admitting: Radiation Oncology

## 2021-06-24 DIAGNOSIS — Z510 Encounter for antineoplastic radiation therapy: Secondary | ICD-10-CM | POA: Diagnosis not present

## 2021-06-25 ENCOUNTER — Other Ambulatory Visit: Payer: Self-pay

## 2021-06-25 ENCOUNTER — Ambulatory Visit
Admission: RE | Admit: 2021-06-25 | Discharge: 2021-06-25 | Disposition: A | Discharge status: Home / Self Care | Payer: Medicaid Other | Source: Ambulatory Visit | Attending: Radiation Oncology | Admitting: Radiation Oncology

## 2021-06-25 DIAGNOSIS — Z510 Encounter for antineoplastic radiation therapy: Secondary | ICD-10-CM | POA: Diagnosis not present

## 2021-06-26 ENCOUNTER — Ambulatory Visit
Admission: RE | Admit: 2021-06-26 | Discharge: 2021-06-26 | Disposition: A | Discharge status: Home / Self Care | Payer: Medicaid Other | Source: Ambulatory Visit | Attending: Radiation Oncology | Admitting: Radiation Oncology

## 2021-06-26 DIAGNOSIS — Z510 Encounter for antineoplastic radiation therapy: Secondary | ICD-10-CM | POA: Diagnosis not present

## 2021-06-29 ENCOUNTER — Other Ambulatory Visit: Payer: Self-pay

## 2021-06-29 ENCOUNTER — Ambulatory Visit
Admission: RE | Admit: 2021-06-29 | Discharge: 2021-06-29 | Disposition: A | Discharge status: Home / Self Care | Payer: Medicaid Other | Source: Ambulatory Visit | Attending: Radiation Oncology | Admitting: Radiation Oncology

## 2021-06-29 DIAGNOSIS — Z510 Encounter for antineoplastic radiation therapy: Secondary | ICD-10-CM | POA: Diagnosis not present

## 2021-06-30 ENCOUNTER — Encounter: Payer: Self-pay | Admitting: *Deleted

## 2021-06-30 ENCOUNTER — Other Ambulatory Visit: Payer: Self-pay

## 2021-06-30 ENCOUNTER — Ambulatory Visit
Admission: RE | Admit: 2021-06-30 | Discharge: 2021-06-30 | Disposition: A | Discharge status: Home / Self Care | Payer: Medicaid Other | Source: Ambulatory Visit | Attending: Radiation Oncology | Admitting: Radiation Oncology

## 2021-06-30 DIAGNOSIS — C50412 Malignant neoplasm of upper-outer quadrant of left female breast: Secondary | ICD-10-CM

## 2021-06-30 DIAGNOSIS — Z510 Encounter for antineoplastic radiation therapy: Secondary | ICD-10-CM | POA: Diagnosis not present

## 2021-07-01 ENCOUNTER — Ambulatory Visit
Admission: RE | Admit: 2021-07-01 | Discharge: 2021-07-01 | Disposition: A | Discharge status: Home / Self Care | Payer: Medicaid Other | Source: Ambulatory Visit | Attending: Radiation Oncology | Admitting: Radiation Oncology

## 2021-07-01 DIAGNOSIS — Z510 Encounter for antineoplastic radiation therapy: Secondary | ICD-10-CM | POA: Diagnosis not present

## 2021-07-02 ENCOUNTER — Encounter: Payer: Self-pay | Admitting: Hematology

## 2021-07-02 ENCOUNTER — Ambulatory Visit: Payer: Medicaid Other

## 2021-07-02 ENCOUNTER — Ambulatory Visit: Payer: Medicaid Other | Admitting: Hematology and Oncology

## 2021-07-02 ENCOUNTER — Other Ambulatory Visit: Payer: Self-pay

## 2021-07-02 ENCOUNTER — Inpatient Hospital Stay: Payer: Medicaid Other | Attending: Hematology | Admitting: Hematology

## 2021-07-02 ENCOUNTER — Ambulatory Visit
Admission: RE | Admit: 2021-07-02 | Discharge: 2021-07-02 | Disposition: A | Discharge status: Home / Self Care | Payer: Medicaid Other | Source: Ambulatory Visit | Attending: Radiation Oncology | Admitting: Radiation Oncology

## 2021-07-02 VITALS — BP 123/81 | HR 80 | Temp 98.3°F | Wt 207.6 lb

## 2021-07-02 DIAGNOSIS — K769 Liver disease, unspecified: Secondary | ICD-10-CM | POA: Insufficient documentation

## 2021-07-02 DIAGNOSIS — Z510 Encounter for antineoplastic radiation therapy: Secondary | ICD-10-CM | POA: Diagnosis not present

## 2021-07-02 DIAGNOSIS — R600 Localized edema: Secondary | ICD-10-CM | POA: Insufficient documentation

## 2021-07-02 DIAGNOSIS — M25612 Stiffness of left shoulder, not elsewhere classified: Secondary | ICD-10-CM | POA: Insufficient documentation

## 2021-07-02 DIAGNOSIS — C50412 Malignant neoplasm of upper-outer quadrant of left female breast: Secondary | ICD-10-CM | POA: Insufficient documentation

## 2021-07-02 DIAGNOSIS — Z483 Aftercare following surgery for neoplasm: Secondary | ICD-10-CM | POA: Insufficient documentation

## 2021-07-02 DIAGNOSIS — R293 Abnormal posture: Secondary | ICD-10-CM | POA: Insufficient documentation

## 2021-07-02 DIAGNOSIS — Z170 Estrogen receptor positive status [ER+]: Secondary | ICD-10-CM | POA: Insufficient documentation

## 2021-07-02 MED ORDER — TAMOXIFEN CITRATE 20 MG PO TABS: 20.0000 mg | ORAL_TABLET | Freq: Every day | ORAL | 3 refills | Status: DC

## 2021-07-02 NOTE — Progress Notes (Signed)
Sheila [name]   [phone] [phone]   Clinic Follow up Note   Patient Care Team: Griffin Basil, MD as PCP - General (Obstetrics and Gynecology) Elouise [name], MD as Consulting Physician (Cardiology) Mauro Kaufmann, RN as Oncology Nurse Navigator Rockwell Germany, RN as Oncology Nurse Navigator Rolm Bookbinder, MD as Consulting Physician (General Surgery) Truitt Merle, MD as Consulting Physician (Hematology) Kyung Rudd, MD as Consulting Physician (Radiation Oncology)  Date of Service:  07/02/2021  CHIEF COMPLAINT: f/u of left breast cancer  SUMMARY OF ONCOLOGIC HISTORY: Oncology History Overview Note  Cancer Staging Malignant neoplasm of upper-outer quadrant of left breast in female, estrogen receptor positive ([place]) Staging form: Breast, AJCC 8th Edition - Clinical stage from 04/03/2021: Stage IA (cT1c, cN0, cM0, G2, ER+, PR+, HER2-) - Signed by Truitt Merle, MD on 04/14/2021 Stage prefix: Initial diagnosis Histologic grading system: 3 grade system    Malignant neoplasm of upper-outer quadrant of left breast in female, estrogen receptor positive ([place])  03/31/2021 Mammogram   Targeted ultrasound is performed, demonstrating an irregular hypoechoic mass with angular margins at the 2 o'clock position approximately 5 cm nipple measuring approximately 1.4 x 0.8 x 1.0 cm, demonstrating posterior acoustic shadowing, associated with microcalcifications, corresponding to the screening mammographic finding.   Immediately adjacent to the dominant mass is a hypoechoic mass measuring 0.5 x 0.4 x 0.4 cm, demonstrating posterior acoustic enhancement and no internal power Doppler flow.   Sonographic evaluation of the LEFT axilla demonstrates no pathologic lymphadenopathy.   04/03/2021 Cancer Staging   Staging form: Breast, AJCC 8th Edition - Clinical stage from 04/03/2021: Stage IA (cT1c, cN0, cM0, G2, ER+, PR+, HER2-) - Signed by Truitt Merle, MD on  04/14/2021 Stage prefix: Initial diagnosis Histologic grading system: 3 grade system   04/03/2021 Initial Biopsy   Diagnosis Breast, left, needle core biopsy, 2 o'clock - INVASIVE MAMMARY CARCINOMA - SEE COMMENT Microscopic Comment The biopsy material shows an infiltrative proliferation of cells with large vesicular nuclei with inconspicuous nucleoli, arranged linearly and in small clusters. Based on the biopsy, the carcinoma appears Nottingham grade 2 of 3 and measures 1.0 cm in greatest linear extent. E-cadherin and prognostic markers (ER/PR/ki-67/HER2)are pending and will be reported in an addendum. Dr. Jeannie Done reviewed the case and agrees with the above diagnosis. These results were called to [facility] on Apr 06, 2021.  E-cadherin is POSITIVE supporting a ductal origin.   04/03/2021 Receptors her2   PROGNOSTIC INDICATORS Results: IMMUNOHISTOCHEMICAL AND MORPHOMETRIC ANALYSIS PERFORMED MANUALLY The tumor cells are NEGATIVE for Her2 (1+). Estrogen Receptor: 70%, POSITIVE, STRONG STAINING INTENSITY Progesterone Receptor: >95%, POSITIVE, STRONG STAINING INTENSITY Proliferation Marker Ki67: 15%   04/05/2021 Imaging   CT AP  IMPRESSION: 1. No acute abnormality of the abdomen or pelvis. 2. Unchanged appearance of inferior right hepatic lobe lesion containing numerous calcifications.     04/08/2021 Initial Diagnosis   Malignant neoplasm of upper-outer quadrant of left breast in female, estrogen receptor positive ([place])   04/21/2021 Genetic Testing   Negative genetic testing:  No pathogenic variants detected on the [employer] BRCAplus panel (report date 04/21/2021) or CancerNext-Expanded + RNAinsight panel (report date 04/27/2021). 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   The BRCAplus panel offered by [employer] and includes sequencing and deletion/duplication analysis for the following 8 genes: ATM, BRCA1, BRCA2,  CDH1, CHEK2, PALB2, PTEN, and TP53. The CancerNext-Expanded + RNAinsight gene panel offered by  [facility] [employer] and includes sequencing and rearrangement analysis for the following 77 genes: AIP, ALK, APC, ATM, AXIN2, BAP1, BARD1, BLM, BMPR1A, BRCA1, BRCA2, BRIP1, CDC73, CDH1, CDK4, CDKN1B, CDKN2A, CHEK2, CTNNA1, DICER1, FANCC, FH, FLCN, GALNT12, KIF1B, LZTR1, MAX, MEN1, MET, MLH1, MSH2, MSH3, MSH6, MUTYH, NBN, NF1, NF2, NTHL1, PALB2, PHOX2B, PMS2, POT1, PRKAR1A, PTCH1, PTEN, RAD51C, RAD51D, RB1, RECQL, RET, SDHA, SDHAF2, SDHB, SDHC, SDHD, SMAD4, SMARCA4, SMARCB1, SMARCE1, STK11, SUFU, TMEM127, TP53, TSC1, TSC2, VHL and XRCC2 (sequencing and deletion/duplication); EGFR, EGLN1, HOXB13, KIT, MITF, PDGFRA, POLD1 and POLE (sequencing only); EPCAM and GREM1 (deletion/duplication only). RNA data is routinely analyzed for use in variant interpretation for all genes.   04/29/2021 Surgery   FINAL MICROSCOPIC DIAGNOSIS:   A. BREAST, LEFT, LUMPECTOMY:  - Invasive and in situ ductal carcinoma, 1.6 cm.  - Invasive carcinoma 0.1 cm from the lateral margin.  - Biopsy site and biopsy clip.  - See oncology table.   B. BREAST, LEFT POSTERIOR MARGIN, EXCISION:  - Fibrocystic changes with usual ductal hyperplasia.  - Final posterior margin negative for carcinoma.   C. BREAST, LEFT SUPERIOR MARGIN, EXCISION:  - Benign breast tissue.  - Final superior margin negative for carcinoma.   D. LYMPH NODE, LEFT AXILLARY, SENTINEL, EXCISION:  - One lymph node negative for metastatic carcinoma (0/1).   E. BREAST, LEFT MEDIAL MARGIN, EXCISION:  - Benign breast tissue.  - Final medial margin negative for carcinoma.    04/29/2021 Oncotype testing   Recurrence score of 19, distant recurrence risk of 6%, no benefit from chemotherapy      CURRENT THERAPY:  Radiation therapy  INTERVAL HISTORY:  Sheila [name] is here for a follow up of breast cancer. She was last seen by me on 04/15/21 in breast clinic. She presents to the  clinic alone. She reports radiation therapy is rough on her skin. She notes she skin is dark, moist and tender, so she is trying to keep it dry and not to touch it. She notes she last had a period about 6 years ago, but she notes she experienced bleeding following surgery. She reports it lasted 7 days and had a lot of clots.   All other systems were reviewed with the patient and are negative.  MEDICAL HISTORY:  Past Medical History:  Diagnosis Date   Anemia    Anginal pain ([place])    Breast cancer ([place])    Family history of bone cancer    Family history of breast cancer    Family history of prostate cancer    Family history of throat cancer    Malignant neoplasm of upper-outer quadrant of left breast in female, estrogen receptor positive ([place]) 04/08/2021    SURGICAL HISTORY: Past Surgical History:  Procedure Laterality Date   AXILLARY LYMPH NODE BIOPSY Left 04/29/2021   Procedure: AXILLARY LYMPH NODE BIOPSY;  Surgeon: Rolm Bookbinder, MD;  Location: [place];  Service: General;  Laterality: Left;   BREAST LUMPECTOMY WITH RADIOACTIVE SEED AND SENTINEL LYMPH NODE BIOPSY Left 04/29/2021   Procedure: LEFT BREAST LUMPECTOMY WITH RADIOACTIVE SEED;  Surgeon: Rolm Bookbinder, MD;  Location: [place];  Service: General;  Laterality: Left;   CARDIAC CATHETERIZATION     LEFT HEART CATHETERIZATION WITH CORONARY ANGIOGRAM N/A 05/30/2014   Procedure: LEFT HEART CATHETERIZATION WITH CORONARY ANGIOGRAM;  Surgeon: Peter M Martinique, MD;  Location: [facility] CATH LAB;  Service: Cardiovascular;  Laterality: N/A;   TUBAL LIGATION      I have reviewed the social history and family history  with the patient and they are unchanged from previous note.  ALLERGIES:  has No Known Allergies.  MEDICATIONS:  Current Outpatient Medications  Medication Sig Dispense Refill   tamoxifen (NOLVADEX) 20 MG tablet Take 1 tablet (20 mg total) by mouth daily. 30 tablet 3   ibuprofen (ADVIL) 800 MG tablet Take 1 tablet (800 mg total)  by mouth every 8 (eight) hours as needed for moderate pain. 21 tablet 0   prochlorperazine (COMPAZINE) 10 MG tablet Take 1 tablet (10 mg total) by mouth every 6 (six) hours as needed for nausea or vomiting. 30 tablet 0   SUPREP BOWEL PREP KIT 17.5-3.13-1.6 GM/177ML SOLN For colonoscopy prep 354 mL 0   No current facility-administered medications for this visit.    PHYSICAL EXAMINATION: ECOG PERFORMANCE STATUS: 1 - Symptomatic but completely ambulatory  Vitals:   07/02/21 1045  BP: 123/81  Pulse: 80  Temp: 98.3 F (36.8 C)  SpO2: 100%   Wt Readings from Last 3 Encounters:  07/02/21 207 lb 9.6 oz (94.2 kg)  06/02/21 209 lb (94.8 kg)  06/02/21 207 lb 8 oz (94.1 kg)     GENERAL:alert, no distress and comfortable SKIN: skin color normal, no rashes or significant lesions EYES: normal, Conjunctiva are pink and non-injected, sclera clear  NEURO: alert & oriented x 3 with fluent speech  LABORATORY DATA:  I have reviewed the data as listed CBC Latest Ref Rng & Units 05/19/2021 04/15/2021 11/05/2020  WBC 4.0 - 10.5 K/uL 5.9 4.1 4.7  Hemoglobin 12.0 - 15.0 g/dL 12.7 12.2 11.9(L)  Hematocrit 36.0 - 46.0 % 39.3 35.5(L) 36.8  Platelets 150 - 400 K/uL 305 276 279     CMP Latest Ref Rng & Units 05/19/2021 04/15/2021 11/05/2020  Glucose 70 - 99 mg/dL 103(H) 98 96  BUN 6 - 20 mg/dL _0 Creatinine 0.44 - 1.00 mg/dL 0.96 0.95 0.87  Sodium 135 - 145 mmol/L 139 142 141  Potassium 3.5 - 5.1 mmol/L 4.0 4.0 3.7  Chloride 98 - 111 mmol/L 107 108 109  CO2 22 - 32 mmol/L _1 Calcium 8.9 - 10.3 mg/dL 9.1 9.3 9.1  Total Protein 6.5 - 8.1 g/dL 6.9 7.6 -  Total Bilirubin 0.3 - 1.2 mg/dL 1.7(H) 1.0 -  Alkaline Phos 38 - 126 U/L 57 61 -  AST 15 - 41 U/L 21 19 -  ALT 0 - 44 U/L 22 14 -      RADIOGRAPHIC STUDIES: I have personally reviewed the radiological images as listed and agreed with the findings in the report. No results found.   ASSESSMENT & PLAN:  Sheila [name] is a 50 y.o.  female with   1. Malignant neoplasm of upper-outer quadrant of left breast, Stage IA, c(T1cN0M0), ER+/PR+/HER2-, Grade II  -We discussed her image findings and the biopsy results in great details. She has two adjacent 1.4cm and 0.5cm mass in her left breast. She did feel this herself. 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  -She proceeded to left lumpectomy on 04/29/21 under Dr. Donne Hazel. Pathology showed invasive and in situ ductal carcinoma, 1.6 cm. Final margins and biopsied lymph node were negative. -Oncotype RS of 19, distant recurrence risk of 6% with antiestrogen therapy, no chemo needed. I reviewed with her  -Given the strong ER and PR expression, I recommend adjuvant antiestrogen therapy. We discussed anastrozole and tamoxifen. I will go head and prescribe tamoxifen while we wait for her to finish  radiation and for her lab results to return to determine if she is postmenopausal. -We also discussed the breast cancer surveillance after her surgery. She will continue annual screening mammogram, self exam, and a routine office visit with lab and exam with [place].   2. Genetics  -Her mother was diagnosed with breast cancer less than 1 year ago. She also has multiple family members with breast cancer.  -She underwent testing on 04/15/21. Results were negative, with VUS in SDHB gene.  3. Perimenopause -Her last period was ~6 years ago (2016). She had an episode of bleeding following her surgery that lasted for 7 days, and she passed a lot of clots. -She underwent endometrium biopsy on 06/02/21, pathology benign. -I will send her for [facility] and estradiol lab work today to determine if she is postmenopausal.     PLAN:  -proceed to lab for [facility] and estradiol -I prescribed tamoxifen to be started in mid Sep  -Survivorship in 3 months, lab and follow-up with me in 6 months     No problem-specific Assessment & Plan notes found for this encounter.   Orders Placed This  Encounter  Procedures   Estradiol    Standing Status:   Future    Standing Expiration Date:   02/16/5247   FSH-Follicle stimulating hormone    Standing Status:   Future    Standing Expiration Date:   07/02/2022   All questions were answered. The patient knows to call the clinic with any problems, questions or concerns. No barriers to learning was detected. The total time spent in the appointment was 30 minutes.     Truitt Merle, MD 07/02/2021   I, Wilburn Mylar, am acting as scribe for Truitt Merle, MD.   I have reviewed the above documentation for accuracy and completeness, and I agree with the above.

## 2021-07-03 ENCOUNTER — Ambulatory Visit: Payer: Medicaid Other

## 2021-07-03 ENCOUNTER — Ambulatory Visit
Admission: RE | Admit: 2021-07-03 | Discharge: 2021-07-03 | Disposition: A | Discharge status: Home / Self Care | Payer: Medicaid Other | Source: Ambulatory Visit | Attending: Radiation Oncology | Admitting: Radiation Oncology

## 2021-07-03 ENCOUNTER — Telehealth: Payer: Self-pay | Admitting: Hematology

## 2021-07-03 DIAGNOSIS — Z510 Encounter for antineoplastic radiation therapy: Secondary | ICD-10-CM | POA: Diagnosis not present

## 2021-07-03 NOTE — Telephone Encounter (Signed)
Scheduled follow-up appointments per 8/18 los. Patient is aware. 

## 2021-07-06 ENCOUNTER — Encounter: Payer: Self-pay | Admitting: Radiation Oncology

## 2021-07-07 NOTE — Progress Notes (Signed)
                                                                                                                                                             Patient Name: Sheila [name] MRN: [id number] DOB: Feb 07, 1971 Referring Physician: Donne Hazel MATTHEW (Profile Not Attached) Date of Service: 07/03/2021 [facility], [place]                                                        End Of Treatment Note  Diagnoses: C50.412-Malignant neoplasm of upper-outer quadrant of left female breast  Cancer Staging: Stage IA, pT1cN0M0 grade 2 ER/PR positive invasive ductal carcinoma of the left breast.   Intent: Curative  Radiation Treatment Dates: 06/04/2021 through 07/03/2021 Site Technique Total Dose (Gy) Dose per Fx (Gy) Completed Fx Beam Energies  Breast, Left: Breast_Lt 3D 42.56/42.56 2.66 16/16 6X, 10X  Breast, Left: Breast_Lt_Bst 3D 8/8 2 4/4 6X, 10X   Narrative: The patient tolerated radiation therapy relatively well. She developed fatigue and anticipated skin changes in the treatment field.   Plan: The patient will receive a call in about one month from the radiation oncology department. She will continue follow up with Dr. Burr Medico as well.  ________________________________________________    Carola Rhine, [facility]

## 2021-07-09 NOTE — Progress Notes (Signed)
  Radiation Oncology         [phone] ________________________________  Name: Sheila [name] MRN: [id number]  Date: 06/23/2021  DOB: [birth year]  SIMULATION NOTE   NARRATIVE:  The patient underwent simulation today for ongoing radiation therapy.  The existing CT study set was employed for the purpose of virtual treatment planning.  The target and avoidance structures were reviewed and modified as necessary.  Treatment planning then occurred.  The radiation boost prescription was entered and confirmed.  A total of 3 complex treatment devices were fabricated in the form of multi-leaf collimators to shape radiation around the targets while maximally excluding nearby normal structures. I have requested : Isodose Plan.    PLAN:  This modified radiation beam arrangement is intended to continue the current radiation dose to an additional 8 Gy in 4 fractions for a total cumulative dose of 50.56 Gy.    ------------------------------------------------  Jodelle Gross, MD, PhD

## 2021-08-06 ENCOUNTER — Other Ambulatory Visit: Payer: Self-pay

## 2021-08-06 ENCOUNTER — Encounter: Payer: Self-pay | Admitting: Internal Medicine

## 2021-08-06 ENCOUNTER — Ambulatory Visit (INDEPENDENT_AMBULATORY_CARE_PROVIDER_SITE_OTHER): Payer: Medicaid Other | Admitting: Internal Medicine

## 2021-08-06 VITALS — BP 123/88 | HR 85 | Ht 69.0 in | Wt 210.6 lb

## 2021-08-06 DIAGNOSIS — R072 Precordial pain: Secondary | ICD-10-CM

## 2021-08-06 DIAGNOSIS — Z170 Estrogen receptor positive status [ER+]: Secondary | ICD-10-CM

## 2021-08-06 DIAGNOSIS — C50412 Malignant neoplasm of upper-outer quadrant of left female breast: Secondary | ICD-10-CM | POA: Diagnosis not present

## 2021-08-06 DIAGNOSIS — E7800 Pure hypercholesterolemia, unspecified: Secondary | ICD-10-CM

## 2021-08-06 NOTE — Progress Notes (Signed)
Cardiology Office Note:    Date:  08/06/2021   ID:  Sheila [name], DOB [birth year], MRN [id number]  PCP:  Griffin Basil, MD  Cardiologist:  None  Electrophysiologist:  None   Referring MD: No ref. provider found   Chief Complaint/Reason for Referral: Follow up; left breast cancer  History of Present Illness:    Sheila [name] is a 50 y.o. female with a history of history of anemia and impaired fasting glucose who presents for follow up. She has been diagnosed with a malignant neoplasm of upper-outer quadrant of left breast, estrogen receptor positive in late May 2022, followed by Dr. 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 She has been given adjuvant antiestrogen therapy, she has also been prescribed radiation therapy.   She is well overall from cardiovascular perspective. The patient denies chest pain, chest pressure, dyspnea at rest or with exertion, palpitations, PND, orthopnea, or leg swelling. Denies cough, fever, chills. Denies nausea, vomiting. Denies syncope or presyncope. Denies dizziness or lightheadedness. Denies snoring.  Past Medical History:  Diagnosis Date   Anemia    Anginal pain ([place])    Breast cancer ([place])    Family history of bone cancer    Family history of breast cancer    Family history of prostate cancer    Family history of throat cancer    Malignant neoplasm of upper-outer quadrant of left breast in female, estrogen receptor positive (Sheila [name]) 04/08/2021    Past Surgical History:  Procedure Laterality Date   AXILLARY LYMPH NODE BIOPSY Left 04/29/2021   Procedure: AXILLARY LYMPH NODE BIOPSY;  Surgeon: Rolm Bookbinder, MD;  Location: [place];  Service: General;  Laterality: Left;   BREAST LUMPECTOMY WITH RADIOACTIVE SEED AND SENTINEL LYMPH NODE BIOPSY Left 04/29/2021   Procedure: LEFT BREAST LUMPECTOMY WITH RADIOACTIVE SEED;  Surgeon: Rolm Bookbinder, MD;  Location: [place];  Service:  General;  Laterality: Left;   CARDIAC CATHETERIZATION     LEFT HEART CATHETERIZATION WITH CORONARY ANGIOGRAM N/A 05/30/2014   Procedure: LEFT HEART CATHETERIZATION WITH CORONARY ANGIOGRAM;  Surgeon: Peter M Martinique, MD;  Location: [facility] CATH LAB;  Service: Cardiovascular;  Laterality: N/A;   TUBAL LIGATION      Current Medications: No outpatient medications have been marked as taking for the 08/06/21 encounter (Office Visit) with Elouise [name], MD.  Reviewed, unable to pull into note.   Allergies:   Patient has no known allergies.   Social History   Tobacco Use   Smoking status: Never   Smokeless tobacco: Never  Vaping Use   Vaping Use: Never used  Substance Use Topics   Alcohol use: No   Drug use: No     Family History: The patient's family history includes Bone cancer (age of onset: 48) in her nephew; Breast cancer in her cousin and other family members; Breast cancer (age of onset: 37) in her mother; Breast cancer (age of onset: 79) in her maternal grandmother; Cancer in her paternal aunt, paternal uncle, paternal uncle, and other family members; Colon cancer in her maternal uncle; Crohn's disease in her brother and sister; Diabetes in her father; Heart Problems in her mother; Heart disease in her mother and sister; Hyperlipidemia in her mother; Hypertension in her maternal aunt, maternal grandmother, maternal uncle, and mother; Pancreatic cancer in her cousin; Prostate cancer in her maternal uncle, maternal uncle and another family member; Seizures in her father; Sickle cell anemia in an other family  member; Stroke in her maternal grandfather and maternal grandmother; Throat cancer in her paternal uncle. There is no history of Esophageal cancer or Stomach cancer.  ROS:   Please see the history of present illness.    All other systems reviewed and are negative.  EKGs/Labs/Other Studies Reviewed:    The following studies were reviewed today:  EKG:  NSR, T wave abnl laterally, no  change from prior.  Recent Labs: 05/19/2021: ALT 22; BUN 14; Creatinine, Ser 0.96; Hemoglobin 12.7; Platelets 305; Potassium 4.0; Sodium 139  Recent Lipid Panel    Component Value Date/Time   CHOL 213 (H) 06/30/2020 1553   TRIG 93 06/30/2020 1553   HDL 55 06/30/2020 1553   CHOLHDL 3.9 06/30/2020 1553   CHOLHDL 3.4 05/31/2014 0433   VLDL 13 05/31/2014 0433   LDLCALC 141 (H) 06/30/2020 1553    Physical Exam:    VS:  BP 123/88   Pulse 85   Ht 5\' 9"  (1.753 m)   Wt 210 lb 9.6 oz (95.5 kg)   LMP 04/03/2018 (Within Days)   SpO2 96%   BMI 31.10 kg/m     Wt Readings from Last 5 Encounters:  08/06/21 210 lb 9.6 oz (95.5 kg)  07/02/21 207 lb 9.6 oz (94.2 kg)  06/02/21 209 lb (94.8 kg)  06/02/21 207 lb 8 oz (94.1 kg)  05/28/21 207 lb 3.2 oz (94 kg)    Constitutional: No acute distress Eyes: sclera non-icteric, normal conjunctiva and lids ENMT: normal dentition, moist mucous membranes Chest: large horizontally oriented keloid scar across precordium Cardiovascular: regular rhythm, normal rate, no murmurs. S1 and S2 normal. Radial pulses normal bilaterally. No jugular venous distention.  Respiratory: clear to auscultation bilaterally GI : normal bowel sounds, soft and nontender. No distention.   MSK: extremities warm, well perfused. No edema.  NEURO: grossly nonfocal exam, moves all extremities. PSYCH: alert and oriented x 3, normal mood and affect.   ASSESSMENT:    1. Precordial pain   2. Malignant neoplasm of upper-outer quadrant of left breast in female, estrogen receptor positive ([place])   3. Pure hypercholesterolemia     PLAN:    Chest pain - no significant recurrence. 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   Left breast cancer - followed by oncology. Grossly normal echo in July 2021. Not planning to receive chemotherapy, undergoing radiation and will likely start tamoxifen.  HLD - may need to consider statin therapy, however LDL last checked 1 year ago, pursuing diet and  lifestyle modification. Recommend repeat lipids with PCP.    Total time of encounter: 20 minutes total time of encounter, including 15 minutes spent in face-to-face patient care on the date of this encounter. This time includes coordination of care and counseling regarding above mentioned problem list. Remainder of non-face-to-face time involved reviewing chart documents/testing relevant to the patient encounter and documentation in the medical record. I have independently reviewed documentation from referring provider.   Cherlynn Kaiser, MD, [facility]    Medication Adjustments/Labs and Tests Ordered: Current medicines are reviewed at length with the patient today.  Concerns regarding medicines are outlined above.   No orders of the defined types were placed in this encounter.    No orders of the defined types were placed in this encounter.    Patient Instructions  Medication Instructions:  No Changes In Medications at this time.  *If you need a refill on your cardiac medications before your next appointment, please call your pharmacy*  Follow-Up: At [facility], you and your health needs are our priority.  As part of our continuing mission to provide you with exceptional heart care, we have created designated Provider Care Teams.  These Care Teams include your primary Cardiologist (physician) and Advanced Practice Providers (APPs -  Physician Assistants and Nurse Practitioners) who all work together to provide you with the care you need, when you need it.  Your next appointment:   6 month(s)  The format for your next appointment:   In Person  Provider:   Cherlynn Kaiser, MD

## 2021-08-06 NOTE — Patient Instructions (Signed)

## 2021-08-07 ENCOUNTER — Emergency Department (HOSPITAL_COMMUNITY): Payer: Medicaid Other

## 2021-08-07 ENCOUNTER — Ambulatory Visit (AMBULATORY_SURGERY_CENTER): Payer: Medicaid Other | Admitting: Internal Medicine

## 2021-08-07 ENCOUNTER — Encounter: Payer: Self-pay | Admitting: Internal Medicine

## 2021-08-07 ENCOUNTER — Inpatient Hospital Stay (HOSPITAL_COMMUNITY)
Admission: EM | Admit: 2021-08-07 | Discharge: 2021-08-10 | DRG: 101 | Disposition: A | Discharge status: Home / Self Care | Payer: Medicaid Other | Attending: Internal Medicine | Admitting: Internal Medicine

## 2021-08-07 ENCOUNTER — Observation Stay (HOSPITAL_COMMUNITY): Payer: Medicaid Other

## 2021-08-07 ENCOUNTER — Other Ambulatory Visit: Payer: Self-pay

## 2021-08-07 VITALS — BP 152/81 | HR 78 | Temp 97.5°F | Resp 18 | Ht 69.0 in | Wt 207.0 lb

## 2021-08-07 DIAGNOSIS — E669 Obesity, unspecified: Secondary | ICD-10-CM | POA: Diagnosis present

## 2021-08-07 DIAGNOSIS — Z83438 Family history of other disorder of lipoprotein metabolism and other lipidemia: Secondary | ICD-10-CM

## 2021-08-07 DIAGNOSIS — D72819 Decreased white blood cell count, unspecified: Secondary | ICD-10-CM | POA: Diagnosis present

## 2021-08-07 DIAGNOSIS — Z79899 Other long term (current) drug therapy: Secondary | ICD-10-CM

## 2021-08-07 DIAGNOSIS — R569 Unspecified convulsions: Secondary | ICD-10-CM | POA: Diagnosis not present

## 2021-08-07 DIAGNOSIS — Z832 Family history of diseases of the blood and blood-forming organs and certain disorders involving the immune mechanism: Secondary | ICD-10-CM

## 2021-08-07 DIAGNOSIS — R531 Weakness: Secondary | ICD-10-CM | POA: Diagnosis not present

## 2021-08-07 DIAGNOSIS — E872 Acidosis: Secondary | ICD-10-CM | POA: Diagnosis present

## 2021-08-07 DIAGNOSIS — D125 Benign neoplasm of sigmoid colon: Secondary | ICD-10-CM | POA: Diagnosis not present

## 2021-08-07 DIAGNOSIS — Z803 Family history of malignant neoplasm of breast: Secondary | ICD-10-CM

## 2021-08-07 DIAGNOSIS — H547 Unspecified visual loss: Secondary | ICD-10-CM | POA: Diagnosis present

## 2021-08-07 DIAGNOSIS — Z808 Family history of malignant neoplasm of other organs or systems: Secondary | ICD-10-CM

## 2021-08-07 DIAGNOSIS — Z170 Estrogen receptor positive status [ER+]: Secondary | ICD-10-CM

## 2021-08-07 DIAGNOSIS — Z823 Family history of stroke: Secondary | ICD-10-CM

## 2021-08-07 DIAGNOSIS — Z8042 Family history of malignant neoplasm of prostate: Secondary | ICD-10-CM

## 2021-08-07 DIAGNOSIS — Z1211 Encounter for screening for malignant neoplasm of colon: Secondary | ICD-10-CM

## 2021-08-07 DIAGNOSIS — E876 Hypokalemia: Secondary | ICD-10-CM | POA: Diagnosis present

## 2021-08-07 DIAGNOSIS — F410 Panic disorder [episodic paroxysmal anxiety] without agoraphobia: Secondary | ICD-10-CM | POA: Diagnosis present

## 2021-08-07 DIAGNOSIS — Z6830 Body mass index (BMI) 30.0-30.9, adult: Secondary | ICD-10-CM

## 2021-08-07 DIAGNOSIS — Z923 Personal history of irradiation: Secondary | ICD-10-CM

## 2021-08-07 DIAGNOSIS — M79601 Pain in right arm: Secondary | ICD-10-CM | POA: Diagnosis not present

## 2021-08-07 DIAGNOSIS — Z833 Family history of diabetes mellitus: Secondary | ICD-10-CM

## 2021-08-07 DIAGNOSIS — Z8249 Family history of ischemic heart disease and other diseases of the circulatory system: Secondary | ICD-10-CM

## 2021-08-07 DIAGNOSIS — R5381 Other malaise: Secondary | ICD-10-CM | POA: Diagnosis present

## 2021-08-07 DIAGNOSIS — Z20822 Contact with and (suspected) exposure to covid-19: Secondary | ICD-10-CM | POA: Diagnosis present

## 2021-08-07 DIAGNOSIS — H532 Diplopia: Secondary | ICD-10-CM | POA: Diagnosis not present

## 2021-08-07 DIAGNOSIS — Z853 Personal history of malignant neoplasm of breast: Secondary | ICD-10-CM

## 2021-08-07 DIAGNOSIS — Z820 Family history of epilepsy and other diseases of the nervous system: Secondary | ICD-10-CM

## 2021-08-07 DIAGNOSIS — D122 Benign neoplasm of ascending colon: Secondary | ICD-10-CM | POA: Diagnosis not present

## 2021-08-07 DIAGNOSIS — Z800 Family history of malignant neoplasm of digestive organs: Secondary | ICD-10-CM

## 2021-08-07 DIAGNOSIS — I2510 Atherosclerotic heart disease of native coronary artery without angina pectoris: Secondary | ICD-10-CM | POA: Diagnosis present

## 2021-08-07 DIAGNOSIS — M25511 Pain in right shoulder: Secondary | ICD-10-CM

## 2021-08-07 DIAGNOSIS — C50412 Malignant neoplasm of upper-outer quadrant of left female breast: Secondary | ICD-10-CM | POA: Diagnosis present

## 2021-08-07 DIAGNOSIS — Z884 Allergy status to anesthetic agent status: Secondary | ICD-10-CM

## 2021-08-07 LAB — URINALYSIS, COMPLETE (UACMP) WITH MICROSCOPIC
Bacteria, UA: NONE SEEN
Bilirubin Urine: NEGATIVE
Glucose, UA: NEGATIVE mg/dL
Hgb urine dipstick: NEGATIVE
Ketones, ur: NEGATIVE mg/dL
Leukocytes,Ua: NEGATIVE
Nitrite: NEGATIVE
Protein, ur: NEGATIVE mg/dL
Specific Gravity, Urine: 1.004 — ABNORMAL LOW (ref 1.005–1.030)
pH: 6 (ref 5.0–8.0)

## 2021-08-07 LAB — I-STAT VENOUS BLOOD GAS, ED
Acid-Base Excess: 0 mmol/L (ref 0.0–2.0)
Bicarbonate: 25.5 mmol/L (ref 20.0–28.0)
Calcium, Ion: 1.1 mmol/L — ABNORMAL LOW (ref 1.15–1.40)
HCT: 35 % — ABNORMAL LOW (ref 36.0–46.0)
Hemoglobin: 11.9 g/dL — ABNORMAL LOW (ref 12.0–15.0)
O2 Saturation: 70 %
Potassium: 3.9 mmol/L (ref 3.5–5.1)
Sodium: 144 mmol/L (ref 135–145)
TCO2: 27 mmol/L (ref 22–32)
pCO2, Ven: 43.1 mmHg — ABNORMAL LOW (ref 44.0–60.0)
pH, Ven: 7.38 (ref 7.250–7.430)
pO2, Ven: 37 mmHg (ref 32.0–45.0)

## 2021-08-07 LAB — COMPREHENSIVE METABOLIC PANEL
ALT: 18 U/L (ref 0–44)
AST: 28 U/L (ref 15–41)
Albumin: 3.3 g/dL — ABNORMAL LOW (ref 3.5–5.0)
Alkaline Phosphatase: 53 U/L (ref 38–126)
Anion gap: 8 (ref 5–15)
BUN: 6 mg/dL (ref 6–20)
CO2: 22 mmol/L (ref 22–32)
Calcium: 8.4 mg/dL — ABNORMAL LOW (ref 8.9–10.3)
Chloride: 111 mmol/L (ref 98–111)
Creatinine, Ser: 0.82 mg/dL (ref 0.44–1.00)
GFR, Estimated: >60 mL/min (ref >60)
Glucose, Bld: 83 mg/dL (ref 70–99)
Potassium: 3.9 mmol/L (ref 3.5–5.1)
Sodium: 141 mmol/L (ref 135–145)
Total Bilirubin: 1.8 mg/dL — ABNORMAL HIGH (ref 0.3–1.2)
Total Protein: 6.2 g/dL — ABNORMAL LOW (ref 6.5–8.1)

## 2021-08-07 LAB — RESP PANEL BY RT-PCR (FLU A&B, COVID) ARPGX2
Influenza A by PCR: NEGATIVE
Influenza B by PCR: NEGATIVE
SARS Coronavirus 2 by RT PCR: NEGATIVE

## 2021-08-07 LAB — CBC WITH DIFFERENTIAL/PLATELET
Abs Immature Granulocytes: 0.01 10*3/uL (ref 0.00–0.07)
Basophils Absolute: 0 10*3/uL (ref 0.0–0.1)
Basophils Relative: 0 %
Eosinophils Absolute: 0.1 10*3/uL (ref 0.0–0.5)
Eosinophils Relative: 2 %
HCT: 35.9 % — ABNORMAL LOW (ref 36.0–46.0)
Hemoglobin: 11.6 g/dL — ABNORMAL LOW (ref 12.0–15.0)
Immature Granulocytes: 0 %
Lymphocytes Relative: 25 %
Lymphs Abs: 0.9 10*3/uL (ref 0.7–4.0)
MCH: 29.4 pg (ref 26.0–34.0)
MCHC: 32.3 g/dL (ref 30.0–36.0)
MCV: 91.1 fL (ref 80.0–100.0)
Monocytes Absolute: 0.4 10*3/uL (ref 0.1–1.0)
Monocytes Relative: 10 %
Neutro Abs: 2.4 10*3/uL (ref 1.7–7.7)
Neutrophils Relative %: 63 %
Platelets: 179 10*3/uL (ref 150–400)
RBC: 3.94 MIL/uL (ref 3.87–5.11)
RDW: 12.5 % (ref 11.5–15.5)
WBC: 3.8 10*3/uL — ABNORMAL LOW (ref 4.0–10.5)
nRBC: 0 % (ref 0.0–0.2)

## 2021-08-07 LAB — CBG MONITORING, ED: Glucose-Capillary: 74 mg/dL (ref 70–99)

## 2021-08-07 LAB — I-STAT BETA HCG BLOOD, ED (MC, WL, AP ONLY): I-stat hCG, quantitative: <5 m[IU]/mL (ref <5)

## 2021-08-07 LAB — LACTIC ACID, PLASMA: Lactic Acid, Venous: 2.2 mmol/L (ref 0.5–1.9)

## 2021-08-07 LAB — AMMONIA: Ammonia: 33 umol/L (ref 9–35)

## 2021-08-07 LAB — ETHANOL: Alcohol, Ethyl (B): <10 mg/dL (ref <10)

## 2021-08-07 MED ORDER — ORAL CARE MOUTH RINSE
15.0000 mL | OROMUCOSAL | Status: DC
  Administered 2021-08-08: 15 mL via OROMUCOSAL

## 2021-08-07 MED ORDER — LACTATED RINGERS IV BOLUS
1000.0000 mL | Freq: Once | INTRAVENOUS | Status: AC
  Administered 2021-08-07: 1000 mL via INTRAVENOUS

## 2021-08-07 MED ORDER — SODIUM CHLORIDE 0.9 % IV SOLN
75.0000 mL/h | INTRAVENOUS | Status: DC
  Administered 2021-08-07 – 2021-08-08 (×2): 75 mL/h via INTRAVENOUS

## 2021-08-07 MED ORDER — LORAZEPAM 2 MG/ML IJ SOLN: 4.0000 mg | INTRAMUSCULAR | Status: DC | PRN

## 2021-08-07 MED ORDER — SODIUM CHLORIDE 0.9 % IV SOLN: 500.0000 mL | Freq: Once | INTRAVENOUS | Status: DC

## 2021-08-07 MED ORDER — LEVETIRACETAM IN NACL 1500 MG/100ML IV SOLN
1500.0000 mg | INTRAVENOUS | Status: AC
Start: 2021-08-07 — End: 2021-08-07
  Administered 2021-08-07 (×2): 1500 mg via INTRAVENOUS

## 2021-08-07 MED ORDER — SODIUM CHLORIDE 0.9 % IV SOLN: 3000.0000 mg | INTRAVENOUS | Status: DC

## 2021-08-07 MED ORDER — SODIUM CHLORIDE 0.9 % IV SOLN: INTRAVENOUS | Status: AC

## 2021-08-07 MED ORDER — CHLORHEXIDINE GLUCONATE 0.12% ORAL RINSE (MEDLINE KIT)
15.0000 mL | Freq: Two times a day (BID) | OROMUCOSAL | Status: DC
  Administered 2021-08-09: 15 mL via OROMUCOSAL

## 2021-08-07 MED ORDER — SODIUM CHLORIDE 0.9 % IV BOLUS
1000.0000 mL | Freq: Once | INTRAVENOUS | Status: AC
  Administered 2021-08-07: 1000 mL via INTRAVENOUS

## 2021-08-07 MED ORDER — ENOXAPARIN SODIUM 40 MG/0.4ML IJ SOSY
40.0000 mg | PREFILLED_SYRINGE | INTRAMUSCULAR | Status: DC
  Administered 2021-08-07 – 2021-08-10 (×3): 40 mg via SUBCUTANEOUS
  Filled 2021-08-07 (×3): qty 0.4

## 2021-08-07 NOTE — Procedures (Signed)
[facility]  Stat EEG Report 20 Minute  Date of Study:   08/07/2021 20:53:00  Indication: Spells, Eval for Seizures,  Technical Summary: A routine 20 channel electroencephalogram using the international 10-20 system of electrode placement was performed.  Background: 9-10 Hz, Posterior dominant rhythm that attenuated with eye Opening  States       Awake      Drowsy: were seen during drowsiness      Asleep: were seen during asleep   Activation Procedures  Hyperventilation: Not performed  Photic Stimulation: Not performed  Classification: Normal : There were no Epileptiform discharges  Diagnosis: Normal Awake study. There are no epileptiform discharges.  Clinical Correlation: This is a normal study. The absence of interictal epileptiform abnormalities does not exclude the diagnosis of a seizure disorder.      Dr Apolinar Junes   [facility] [phone]  Case [id number]

## 2021-08-07 NOTE — Progress Notes (Signed)
EEG was attempted . Tech found patient has a glued hairpiece over whole head. Family, nurse, and texted ordering doctor informed. Unable to do EEG until its removed.

## 2021-08-07 NOTE — ED Notes (Signed)
Pt care taken

## 2021-08-07 NOTE — Progress Notes (Signed)
HISTORY OF PRESENT ILLNESS:  Sheila [name] is a 50 y.o. female who presents today for routine screening colonoscopy.  She was seen in the office June 02, 2021 regarding transient sacral pain see that dictation.  No interval changes  REVIEW OF SYSTEMS:  All non-GI ROS negative except for  Past Medical History:  Diagnosis Date   Anemia    Anginal pain (HCC)    Breast cancer ([place])    Family history of bone cancer    Family history of breast cancer    Family history of prostate cancer    Family history of throat cancer    Malignant neoplasm of upper-outer quadrant of left breast in female, estrogen receptor positive ([place]) 04/08/2021    Past Surgical History:  Procedure Laterality Date   AXILLARY LYMPH NODE BIOPSY Left 04/29/2021   Procedure: AXILLARY LYMPH NODE BIOPSY;  Surgeon: Rolm Bookbinder, MD;  Location: [place];  Service: General;  Laterality: Left;   BREAST LUMPECTOMY WITH RADIOACTIVE SEED AND SENTINEL LYMPH NODE BIOPSY Left 04/29/2021   Procedure: LEFT BREAST LUMPECTOMY WITH RADIOACTIVE SEED;  Surgeon: Rolm Bookbinder, MD;  Location: [place];  Service: General;  Laterality: Left;   CARDIAC CATHETERIZATION     LEFT HEART CATHETERIZATION WITH CORONARY ANGIOGRAM N/A 05/30/2014   Procedure: LEFT HEART CATHETERIZATION WITH CORONARY ANGIOGRAM;  Surgeon: Peter M Martinique, MD;  Location: [facility] CATH LAB;  Service: Cardiovascular;  Laterality: N/A;   TUBAL LIGATION      Social History RAYLIN DIGUGLIELMO  reports that she has never smoked. She has never used smokeless tobacco. She reports that she does not drink alcohol and does not use drugs.  family history includes Bone cancer (age of onset: 27) in her nephew; Breast cancer in her cousin and other family members; Breast cancer (age of onset: 21) in her mother; Breast cancer (age of onset: 14) in her maternal grandmother; Cancer in her paternal aunt, paternal uncle, paternal uncle, and other family members; Colon cancer in her maternal  uncle; Crohn's disease in her brother and sister; Diabetes in her father; Heart Problems in her mother; Heart disease in her mother and sister; Hyperlipidemia in her mother; Hypertension in her maternal aunt, maternal grandmother, maternal uncle, and mother; Pancreatic cancer in her cousin; Prostate cancer in her maternal uncle, maternal uncle and another family member; Seizures in her father; Sickle cell anemia in an other family member; Stroke in her maternal grandfather and maternal grandmother; Throat cancer in her paternal uncle.  No Known Allergies     PHYSICAL EXAMINATION:  Vital signs: BP (!) 158/107   Pulse 81   Temp (!) 97.5 F (36.4 C) (Temporal)   Ht 5\' 9"  (1.753 m)   Wt 207 lb (93.9 kg)   LMP 04/03/2018 (Within Days)   SpO2 99%   BMI 30.57 kg/m  General: Well-developed, well-nourished, no acute distress HEENT: Sclerae are anicteric, conjunctiva pink. Oral mucosa intact Lungs: Clear Heart: Regular Abdomen: soft, nontender, nondistended, no obvious ascites, no peritoneal signs, normal bowel sounds. No organomegaly. Extremities: No edema Psychiatric: alert and oriented x3. Cooperative     ASSESSMENT:  1.  Colorectal neoplasia screening.  Average risk   PLAN:   1.  Screening colonoscopy

## 2021-08-07 NOTE — H&P (Signed)
History and Physical    Sheila [name]:[id number] DOB: August 11, 1971 DOA: 08/07/2021  PCP: Griffin Basil, MD (Confirm with patient/family/NH records and if not entered, this has to be entered at [facility] point of entry) Patient coming from: Home  I have personally briefly reviewed patient's old medical records in [place]  Chief Complaint: Seizure  HPI: Sheila [name] is a 50 y.o. female with medical history significant of breast cancer stage Ia status postlumpectomy and radiation therapy, nonobstructive CAD, family history of seizure presented with new onset of seizure.  Patient had a elective colonoscopy this afternoon for anemia screening, under general anesthesia with propofol.  Right after the procedure, in the recovery room patient had 2 episodes of generalized seizure with witnessed whole body shaking and unresponsive.  Was given Versed 2 times and shifted to ED.  The patient had another 2-3 episodes of seizures in the ED and received a total of 9 mg IV Versed.  At the time I saw the patient, patient remained unresponsive, daughter at bedside reported that the patient has been healthy until recently diagnosed with breast cancer underwent lumpectomy and completed radiation therapy x4 weeks last week.  She has been tolerated well.  Patient is scheduled to start tamoxifen therapy after colonoscopy by oncologist.  Daughter reported strong family history of seizure disorder in both her brother and sister but not herself.  And grandma also had seizure, but patient herself has no history of seizure.  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  ED Course: CT head negative.  Keppra loading dose given, neurology recommend continuous EEG monitoring to rule out status epilepticus.  Review of Systems: Unable to perform, patient obtunded.  Past Medical History:  Diagnosis Date   Anemia    Anginal pain ([place])    Breast cancer ([place])    Family history of bone cancer    Family  history of breast cancer    Family history of prostate cancer    Family history of throat cancer    Malignant neoplasm of upper-outer quadrant of left breast in female, estrogen receptor positive ([place]) 04/08/2021    Past Surgical History:  Procedure Laterality Date   AXILLARY LYMPH NODE BIOPSY Left 04/29/2021   Procedure: AXILLARY LYMPH NODE BIOPSY;  Surgeon: Rolm Bookbinder, MD;  Location: [place];  Service: General;  Laterality: Left;   BREAST LUMPECTOMY WITH RADIOACTIVE SEED AND SENTINEL LYMPH NODE BIOPSY Left 04/29/2021   Procedure: LEFT BREAST LUMPECTOMY WITH RADIOACTIVE SEED;  Surgeon: Rolm Bookbinder, MD;  Location: [place];  Service: General;  Laterality: Left;   CARDIAC CATHETERIZATION     LEFT HEART CATHETERIZATION WITH CORONARY ANGIOGRAM N/A 05/30/2014   Procedure: LEFT HEART CATHETERIZATION WITH CORONARY ANGIOGRAM;  Surgeon: Peter M Martinique, MD;  Location[facility] CATH LAB;  Service: Cardiovascular;  Laterality: N/A;   TUBAL LIGATION       reports that she has never smoked. She has never used smokeless tobacco. She reports that she does not drink alcohol and does not use drugs.  No Known Allergies  Family History  Problem Relation Age of Onset   Heart disease Mother    Hypertension Mother    Heart Problems Mother    Hyperlipidemia Mother    Breast cancer Mother 44   Diabetes Father    Seizures Father    Heart disease Sister    Crohn's disease Sister    Crohn's disease Brother    Hypertension Maternal Aunt    Colon cancer  Maternal Uncle    Hypertension Maternal Uncle    Prostate cancer Maternal Uncle    Prostate cancer Maternal Uncle    Cancer Paternal Aunt        unknown type   Cancer Paternal Uncle        unknown type   Cancer Paternal Uncle        unknown type   Throat cancer Paternal Uncle    Hypertension Maternal Grandmother    Stroke Maternal Grandmother    Breast cancer Maternal Grandmother 34   Stroke Maternal Grandfather    Pancreatic cancer Cousin     Breast cancer Cousin        dx 45s, bilateral mastectomies (maternal first cousin)   Bone cancer Nephew 11       knee, great-nephew   Breast cancer Other        great-aunt (MGF's sister)   Breast cancer Other        first cousin once removed (MGF's niece)   Breast cancer Other        first cousin once removed (MGF's niece)   Cancer Other        unknown type, great-uncle (MGM's brother)   Breast cancer Other        first cousin once removed (MGM's niece)   Breast cancer Other        first cousin once removed (MGM's niece)   Prostate cancer Other        first cousin once removed (MGM's nephew)   Breast cancer Other        second cousin (MGM's great-niece)   Cancer Other        unknown type, dx 64s (maternal aunt's granddaughter)   Sickle cell anemia Other    Esophageal cancer Neg Hx    Stomach cancer Neg Hx    Rectal cancer Neg Hx      Prior to Admission medications   Medication Sig Start Date End Date Taking? Authorizing Provider  ibuprofen (ADVIL) 800 MG tablet Take 1 tablet (800 mg total) by mouth every 8 (eight) hours as needed for moderate pain. Patient not taking: No sig reported 05/20/21   Long, Wonda Olds, MD  prochlorperazine (COMPAZINE) 10 MG tablet Take 1 tablet (10 mg total) by mouth every 6 (six) hours as needed for nausea or vomiting. Patient not taking: No sig reported 06/12/21   Tyler Pita, MD  tamoxifen (NOLVADEX) 20 MG tablet Take 1 tablet (20 mg total) by mouth daily. Patient not taking: No sig reported 07/02/21   Truitt Merle, MD  triamcinolone cream (KENALOG) 0.1 % Apply topically 2 (two) times daily. 07/10/21   [provider]    Physical Exam: Vitals:   08/07/21 1800 08/07/21 1815 08/07/21 1830 08/07/21 1845  BP: 121/66 139/87 127/83 (!) 129/91  Pulse: 82 91 84 89  Resp: 15 18 14 15   Temp:      SpO2: 98% 98% 98% 99%  Weight:      Height:        Constitutional: NAD, calm, comfortable Vitals:   08/07/21 1800 08/07/21 1815 08/07/21 1830  08/07/21 1845  BP: 121/66 139/87 127/83 (!) 129/91  Pulse: 82 91 84 89  Resp: 15 18 14 15   Temp:      SpO2: 98% 98% 98% 99%  Weight:      Height:       Eyes: PERRL, lids and conjunctivae normal ENMT: Mucous membranes are moist. Posterior pharynx clear of any exudate or lesions.Normal dentition.  Neck: normal, supple, no masses, no thyromegaly Respiratory: clear to auscultation bilaterally, no wheezing, no crackles. Normal respiratory effort. No accessory muscle use.  Cardiovascular: Regular rate and rhythm, no murmurs / rubs / gallops. No extremity edema. 2+ pedal pulses. No carotid bruits.  Abdomen: no tenderness, no masses palpated. No hepatosplenomegaly. Bowel sounds positive.  Musculoskeletal: no clubbing / cyanosis. No joint deformity upper and lower extremities. Good ROM, no contractures. Normal muscle tone.  Skin: no rashes, lesions, ulcers. No induration Neurologic: No facial droops, occasionally moving limbs. Psychiatric: Opens eyes, lethargic.    Labs on Admission: I have personally reviewed following labs and imaging studies  CBC: No results for input(s): WBC, NEUTROABS, HGB, HCT, MCV, PLT in the last 168 hours. Basic Metabolic Panel: No results for input(s): NA, K, CL, CO2, GLUCOSE, BUN, CREATININE, CALCIUM, MG, PHOS in the last 168 hours. GFR: CrCl cannot be calculated (Patient's most recent lab result is older than the maximum 21 days allowed.). Liver Function Tests: No results for input(s): AST, ALT, ALKPHOS, BILITOT, PROT, ALBUMIN in the last 168 hours. No results for input(s): LIPASE, AMYLASE in the last 168 hours. No results for input(s): AMMONIA in the last 168 hours. Coagulation Profile: No results for input(s): INR, PROTIME in the last 168 hours. Cardiac Enzymes: No results for input(s): CKTOTAL, CKMB, CKMBINDEX, TROPONINI in the last 168 hours. BNP (last 3 results) No results for input(s): PROBNP in the last 8760 hours. HbA1C: No results for input(s):  HGBA1C in the last 72 hours. CBG: Recent Labs  Lab 08/07/21 1806  GLUCAP 74   Lipid Profile: No results for input(s): CHOL, HDL, LDLCALC, TRIG, CHOLHDL, LDLDIRECT in the last 72 hours. Thyroid Function Tests: No results for input(s): TSH, T4TOTAL, FREET4, T3FREE, THYROIDAB in the last 72 hours. Anemia Panel: No results for input(s): VITAMINB12, FOLATE, FERRITIN, TIBC, IRON, RETICCTPCT in the last 72 hours. Urine analysis:    Component Value Date/Time   COLORURINE STRAW (A) 08/07/2021 1555   APPEARANCEUR CLEAR 08/07/2021 1555   LABSPEC 1.004 (L) 08/07/2021 1555   PHURINE 6.0 08/07/2021 1555   GLUCOSEU NEGATIVE 08/07/2021 1555   HGBUR NEGATIVE 08/07/2021 1555   BILIRUBINUR NEGATIVE 08/07/2021 [place] 08/07/2021 1555   PROTEINUR NEGATIVE 08/07/2021 1555   UROBILINOGEN 0.2 11/29/2019 1332   NITRITE NEGATIVE 08/07/2021 1555   LEUKOCYTESUR NEGATIVE 08/07/2021 1555    Radiological Exams on Admission: CT HEAD WO CONTRAST  Result Date: 08/07/2021 CLINICAL DATA:  Seizure. EXAM: CT HEAD WITHOUT CONTRAST TECHNIQUE: Contiguous axial images were obtained from the base of the skull through the vertex without intravenous contrast. COMPARISON:  September 15, 2017 FINDINGS: Brain: No evidence of acute infarction, hemorrhage, hydrocephalus, extra-axial collection or mass lesion/mass effect. Vascular: No hyperdense vessel or unexpected calcification. Skull: Normal. Negative for fracture or focal lesion. Sinuses/Orbits: No acute finding. Other: None. IMPRESSION: No acute intracranial pathology. Electronically Signed   By: Virgina Norfolk M.D.   On: 08/07/2021 17:07    EKG: Independently reviewed. Sinus, no acute ST changes  Assessment/Plan Active Problems:   Seizure ([place])  (please populate well all problems here in Problem List. (For example, if patient is on BP meds at home and you resume or decide to hold them, it is a problem that needs to be her. Same for CAD, COPD, HLD and  so on)  New onset of seizure -Agree with neurology to rule out brain metastasis.  Unstable for MRI this evening.  Consider MRI in the morning if no more  seizure episodes overnight. -As needed Ativan. -Continuous EEG monitoring as per neurology to rule out status epilepticus -Given there is already multiple episodes of seizures this afternoon, will admit to stepdown unit, seizure precaution, aspiration precautions. -UDS to follow. -NPO, IV fluid. -Overall, etiology not clear.  Patient had general anesthesia for lumpectomy in June without event.  2 out of 3 of her children have seizure seems indicating some underlying seizure related genetic factors.  AMS/encephalopathy GCS=8 now -Might be still under repeated dosage of Versed but will need to rule out status epilepticus as above.  Breast CA stage Ia, ER positive -Outpatient oncology follow-up to start tamoxifen.  DVT prophylaxis: Lovenox Code Status: Full code Family Communication: Daughter at bedside Disposition Plan: Depends on clinical response and to complete real-time EEG monitoring, expect less than 2 midnight hospital stay. Consults called: Neurology Admission status: PCU   Lequita Halt MD [facility] Pager [phone]  08/07/2021, 6:50 PM

## 2021-08-07 NOTE — Consult Note (Signed)
Neurology Consultation  Reason for Consult: seizures Referring Physician: Dr. Shirlyn Goltz  CC: Seizures  History is obtained from: Chart, patient's mother at bedside  HPI: Sheila [name] is a 50 y.o. female past medical history of breast cancer for which she is undergoing radiation, anemia, presented to the emergency room from colonoscopy for evaluation of seizures. She was undergoing colonoscopy and in the recovery unit after having the procedure, she had 2 seizures described as generalized body shaking.  Duration of the seizures was 10 to 20 seconds.  EMS was called, gave her benzodiazepines and brought her to the ER.  In ER she had another couple of seizures and remained very somnolent.  She received a total of 9 mg of Versed IV for the seizures. During the time that I went in her room, she had another episode which was whole body shaking with head and pelvic thrusting and arms flailing with no gaze deviation and eyes closed forcefully.  All of the seizures have been similar. Daughter came in later and reports that the patient does not have any history of seizure disorder but multiple family members do. No reported preceding illnesses or sicknesses   ROS: Unable to obtain due to altered mental status.   Past Medical History:  Diagnosis Date   Anemia    Anginal pain ([place])    Breast cancer (HCC)    Family history of bone cancer    Family history of breast cancer    Family history of prostate cancer    Family history of throat cancer    Malignant neoplasm of upper-outer quadrant of left breast in female, estrogen receptor positive ([place]) 04/08/2021   Family History  Problem Relation Age of Onset   Heart disease Mother    Hypertension Mother    Heart Problems Mother    Hyperlipidemia Mother    Breast cancer Mother 52   Diabetes Father    Seizures Father    Heart disease Sister    Crohn's disease Sister    Crohn's disease Brother    Hypertension Maternal Aunt    Colon cancer  Maternal Uncle    Hypertension Maternal Uncle    Prostate cancer Maternal Uncle    Prostate cancer Maternal Uncle    Cancer Paternal Aunt        unknown type   Cancer Paternal Uncle        unknown type   Cancer Paternal Uncle        unknown type   Throat cancer Paternal Uncle    Hypertension Maternal Grandmother    Stroke Maternal Grandmother    Breast cancer Maternal Grandmother 86   Stroke Maternal Grandfather    Pancreatic cancer Cousin    Breast cancer Cousin        dx 10s, bilateral mastectomies (maternal first cousin)   Bone cancer Nephew 11       knee, great-nephew   Breast cancer Other        great-aunt (MGF's sister)   Breast cancer Other        first cousin once removed (MGF's niece)   Breast cancer Other        first cousin once removed (MGF's niece)   Cancer Other        unknown type, great-uncle (MGM's brother)   Breast cancer Other        first cousin once removed (MGM's niece)   Breast cancer Other        first cousin once removed (MGM's  niece)   Prostate cancer Other        first cousin once removed (MGM's nephew)   Breast cancer Other        second cousin (MGM's great-niece)   Cancer Other        unknown type, dx 49s (maternal aunt's granddaughter)   Sickle cell anemia Other    Esophageal cancer Neg Hx    Stomach cancer Neg Hx    Rectal cancer Neg Hx    Social History:   reports that she has never smoked. She has never used smokeless tobacco. She reports that she does not drink alcohol and does not use drugs.  Medications  Current Facility-Administered Medications:    0.9 %  sodium chloride infusion, 500 mL, Intravenous, Once, Irene Shipper, MD   lactated ringers bolus 1,000 mL, 1,000 mL, Intravenous, Once, Violet Baldy, MD  Current Outpatient Medications:    ibuprofen (ADVIL) 800 MG tablet, Take 1 tablet (800 mg total) by mouth every 8 (eight) hours as needed for moderate pain. (Patient not taking: No sig reported), Disp: 21 tablet, Rfl: 0    prochlorperazine (COMPAZINE) 10 MG tablet, Take 1 tablet (10 mg total) by mouth every 6 (six) hours as needed for nausea or vomiting. (Patient not taking: No sig reported), Disp: 30 tablet, Rfl: 0   tamoxifen (NOLVADEX) 20 MG tablet, Take 1 tablet (20 mg total) by mouth daily. (Patient not taking: No sig reported), Disp: 30 tablet, Rfl: 3   triamcinolone cream (KENALOG) 0.1 %, Apply topically 2 (two) times daily., Disp: , Rfl:   Exam: Current vital signs: BP (!) 148/93   Pulse 99   Resp 16   Ht 5\' 9"  (1.753 m)   Wt 93.9 kg   LMP 04/03/2018 (Within Days)   SpO2 97%   BMI 30.57 kg/m  Vital signs in last 24 hours: Temp:  [97.5 F (36.4 C)] 97.5 F (36.4 C) (09/23 1403) Pulse Rate:  [76-110] 99 (09/23 1600) Resp:  [13-23] 16 (09/23 1600) BP: (109-161)/(57-107) 148/93 (09/23 1600) SpO2:  [93 %-100 %] 97 % (09/23 1600) Weight:  [93.9 kg] 93.9 kg (09/23 1555) General: Obtunded, no distress HEENT: Normocephalic/atraumatic CVS: Regular rhythm Respiratory: Breathing well and saturating normally and protecting her airway Abdomen nondistended nontender Neurological exam Obtunded, does not open eyes to voice Minimal grimace to noxious stimulation Pupils equal round reactive to light, extraocular movements difficult to assess but no gaze preference or deviation.  Does not blink to threat from either side, face looks symmetric. To noxious stimulation, minimal grimace-no withdrawal in any of the 4 extremities.  Labs I have reviewed labs in epic and the results pertinent to this consultation are:  CBC    Component Value Date/Time   WBC 5.9 05/19/2021 1836   RBC 4.25 05/19/2021 1836   RBC 4.31 05/19/2021 1836   HGB 12.7 05/19/2021 1836   HGB 12.2 04/15/2021 0834   HCT 39.3 05/19/2021 1836   PLT 305 05/19/2021 1836   PLT 276 04/15/2021 0834   MCV 91.2 05/19/2021 1836   MCH 29.5 05/19/2021 1836   MCHC 32.3 05/19/2021 1836   RDW 13.6 05/19/2021 1836   LYMPHSABS 1.4 05/19/2021 1836    MONOABS 0.5 05/19/2021 1836   EOSABS 0.0 05/19/2021 1836   BASOSABS 0.0 05/19/2021 1836    CMP     Component Value Date/Time   NA 139 05/19/2021 1836   NA 143 06/30/2020 1553   K 4.0 05/19/2021 1836   CL 107 05/19/2021 1836  CO2 24 05/19/2021 1836   GLUCOSE 103 (H) 05/19/2021 1836   BUN 14 05/19/2021 1836   BUN 8 06/30/2020 1553   CREATININE 0.96 05/19/2021 1836   CREATININE 0.95 04/15/2021 0834   CALCIUM 9.1 05/19/2021 1836   PROT 6.9 05/19/2021 1836   ALBUMIN 3.8 05/19/2021 1836   AST 21 05/19/2021 1836   AST 19 04/15/2021 0834   ALT 22 05/19/2021 1836   ALT 14 04/15/2021 0834   ALKPHOS 57 05/19/2021 1836   BILITOT 1.7 (H) 05/19/2021 1836   BILITOT 1.0 04/15/2021 0834   GFRNONAA >60 05/19/2021 1836   GFRNONAA >60 04/15/2021 0834   GFRAA 82 06/30/2020 1553   Imaging I have reviewed the images obtained:  CT-head-no acute changes  Assessment: 51 year old with history of breast cancer with sudden onset of generalized tonic-clonic seizure-like activity while in recovery after getting a colonoscopy today. Unclear inciting factors. The semiology looks rather atypical for seizure activity. Repeat exam assuring.  Impression: New onset seizure-like activity-etiology under investigation History of breast cancer-evaluate for metastases Concern also remains for nonepileptic events given semiology of the seizures.  Recommendations: Loaded with Keppra 3 g Benzos for seizure lasting more than 5 minutes Continue Keppra 500 twice daily Stat EEG Stat CT head MRI brain when able to to r/o mets Further recommendations based on EEG  -- Amie Portland, MD Neurologist [facility] Pager: [phone]  ADDENDUM On repeat exam 15 minutes later, she is more awake.  She is following some commands.  She is able to stick her tongue out.  She is able to raise her arms up.  Has some fluttering of the eyelids as trying to talk to her.  EEG attempted but she has a glued  hairpiece that precludes lead placement. Awaiting family arrival for removal of hairpiece. Plan remains as above.  -- Amie Portland, MD Neurologist [facility] Pager: [phone]  Addendum Had another episode of seizure-all the episodes of been 10 to 15 seconds. I will obtain a stat EEG and hooked up to LTM EEG overnight with the hope to capture 1 of these events for better characterization. Plan discussed with Dr. Darl Householder in the ED. Neurology will follow. -- Amie Portland, MD Neurologist [facility] Pager: [phone]  CRITICAL CARE ATTESTATION Performed by: Amie Portland, MD Total critical care time: 60 minutes Critical care time was exclusive of separately billable procedures and treating other patients and/or supervising APPs/Residents/Students Critical care was necessary to treat or prevent imminent or life-threatening deterioration due to new onset seizures, concern for status epilepticus  This patient is critically ill and at significant risk for neurological worsening and/or death and care requires constant monitoring. Critical care was time spent personally by me on the following activities: development of treatment plan with patient and/or surrogate as well as nursing, discussions with consultants, evaluation of patient's response to treatment, examination of patient, obtaining history from patient or surrogate, ordering and performing treatments and interventions, ordering and review of laboratory studies, ordering and review of radiographic studies, pulse oximetry, re-evaluation of patient's condition, participation in multidisciplinary rounds and medical decision making of high complexity in the care of this patient.

## 2021-08-07 NOTE — Progress Notes (Signed)
Called to room to assist during endoscopic procedure.  Patient ID and intended procedure confirmed with present staff. Received instructions for my participation in the procedure from the performing physician.  

## 2021-08-07 NOTE — Op Note (Addendum)
[facility] Patient Name: Sheila [name] Procedure Date: 08/07/2021 2:24 PM MRN: [id number] Endoscopist: Docia Chuck. Henrene Pastor , MD Age: 50 Referring MD:  Date of Birth: 07-05-1971 Gender: Female Account #: [id number] Procedure:                Colonoscopy with cold snare polypectomy x 2 Indications:              Screening for colorectal malignant neoplasm Medicines:                Monitored Anesthesia Care Procedure:                Pre-Anesthesia Assessment:                           - Prior to the procedure, a History and Physical                            was performed, and patient medications and                            allergies were reviewed. The patient's tolerance of                            previous anesthesia was also reviewed. The risks                            and benefits of the procedure and the sedation                            options and risks were discussed with the patient.                            All questions were answered, and informed consent                            was obtained. Prior Anticoagulants: The patient has                            taken no previous anticoagulant or antiplatelet                            agents. ASA Grade Assessment: II - A patient with                            mild systemic disease. After reviewing the risks                            and benefits, the patient was deemed in                            satisfactory condition to undergo the procedure.                           After obtaining informed consent, the colonoscope  was passed under direct vision. Throughout the                            procedure, the patient's blood pressure, pulse, and                            oxygen saturations were monitored continuously. The                            Olympus CF-HQ190L [id number]) Colonoscope was                            introduced through the anus and advanced to the the                             cecum, identified by appendiceal orifice and                            ileocecal valve. The ileocecal valve, appendiceal                            orifice, and rectum were photographed. The quality                            of the bowel preparation was excellent. The                            colonoscopy was performed without difficulty. The                            patient tolerated the procedure well. The bowel                            preparation used was SUPREP via split dose                            instruction. Scope In: 2:31:15 PM Scope Out: 2:48:10 PM Scope Withdrawal Time: 0 hours 13 minutes 38 seconds  Total Procedure Duration: 0 hours 16 minutes 55 seconds  Findings:                 Two polyps were found in the sigmoid colon and                            ascending colon. The polyps were 2 to 4 mm in size.                            These polyps were removed with a cold snare.                            Resection and retrieval were complete.                           Multiple small-mouthed diverticula were found in  the sigmoid colon.                           The exam was otherwise without abnormality on                            direct and retroflexion views. Complications:            No immediate complications. Estimated blood loss:                            None. Estimated Blood Loss:     Estimated blood loss: none. Impression:               - Two 2 to 4 mm polyps in the sigmoid colon and in                            the ascending colon, removed with a cold snare.                            Resected and retrieved.                           - Diverticulosis in the sigmoid colon.                           - The examination was otherwise normal on direct                            and retroflexion views. Recommendation:           - Repeat colonoscopy in 7 years for surveillance.                           - Patient has a  contact number available for                            emergencies. The signs and symptoms of potential                            delayed complications were discussed with the                            patient. Return to normal activities tomorrow.                            Written discharge instructions were provided to the                            patient.                           - Resume previous diet.                           - Continue present medications.                           -  Await pathology results.                           ADDENDUM: Post procedure the patient went to                            recovery. Initially she was communicative without                            complaints and stable vital signs. However,                            subsequently developed a recurrent motor seizure.                            She was attended to by the CRNA's and myself. She                            was given 4 mg of IV Versed. Seizure activity                            stopped. She has subsequently been somnolent but                            arousable. No evidence for seizure related trauma.                            Vital signs continue to be stable throughout. EMS                            was called and she will be transferred to the                            emergency room. The patient's husband and mother                            were at bedside at the time of her initial seizure                            activity. She does not have a seizure disorder. She                            was diagnosed with breast cancer earlier this year                            and has undergone radiation therapy, recently                            completed. She subsequently developed recurrent                            seizure with EMS here to attend. I reviewed the  patient's colonoscopy report with the family.                            Provided  them a copy. Informed that she will be                            going to the hospital for further assessment. Docia Chuck. Henrene Pastor, MD 08/07/2021 2:55:12 PM This report has been signed electronically.

## 2021-08-07 NOTE — Progress Notes (Signed)
Called to patient's bay to assist at 1500 following seizure like activity. VS were stable with SPO2 @100 % on room air. The patient was placed in left lateral position, with head and neck neutral. Oxygen was administered via nasal cannula at 3L initially, and was replaced with a NR mask at 10L.VS were monitored continuously, and remained stable throughout the patient's stay in recovery. The attending MD and I witnessed a second round of seizure like activity, which was quickly treated with 4mg  of midazolam. Pupils were equal and reactive to light. Eyelid reflex equal bilaterally. Patient responded verbally by saying "Mama" after I said her name. This was just prior to transport by EMS.    EMS arrived to transport the patient for further evaluation. Immediately prior to transferring her to the EMS stretcher, an additional 2.5mg  of midazolam was given by EMS, following a brief period of seizure like activity. The patient was stable and resting peacefully upon leaving via  elevator with EMS staff.

## 2021-08-07 NOTE — Progress Notes (Signed)
Patient got to recovery room post routine colonoscopy at 1453. VS were within normal range with blood pressure 125/68, heart rate 86 bpm, oxygen 97% on room air, and respiratory rate 18.   At 1500, patient started to have seizure-like activity with full body jerking. At 1503 4 mg of IV versed were given and EMS was call at 1505. Patients vital signs remained within normal range. Seizure lasted for 5 minutes. The patient had a brief second seizure for about 10-20 seconds long shortly after the Versed had been given.   When EMS arrived at 1509, patient started to have seizure-like activity again with full body jerking. This was her third seizure with no prior history of seizures.   EMS transferred patient to [facility] for further assessment and report was called to the Emergency Department.  Patient has husband and her mother at bedside and were updated by MD.

## 2021-08-07 NOTE — ED Notes (Signed)
Neurology at bedside.

## 2021-08-07 NOTE — ED Provider Notes (Addendum)
[facility] EMERGENCY DEPARTMENT Provider Note   CSN: [id number] Arrival date & time: 08/07/21  1551     History Chief Complaint  Patient presents with   Seizures    Sheila [name] is a 50 y.o. female with PMHx anxiety and panic attacks who presents for evaluation of seizure-like activity.  Patient was reportedly undergoing an outpatient colonoscopy when she was transferred to the recovery center.  While recovering from anesthesia, she began to develop generalized tonic-clonic activity, for which she was given 4 mg of Versed with improvement.  EMS was called, who witnessed 3 additional seizures while in route to our emergency department.  The first seizure aborted without intervention, but patient was given two additional 2.5 mg doses of Versed for the second and third seizures, with improvement.  No reported prior history of seizures.  Also, all when his seizures appear to be generalized.  No focal activity noted.      Past Medical History:  Diagnosis Date   Anemia    Anginal pain ([place])    Breast cancer ([place])    Family history of bone cancer    Family history of breast cancer    Family history of prostate cancer    Family history of throat cancer    Malignant neoplasm of upper-outer quadrant of left breast in female, estrogen receptor positive ([place]) 04/08/2021    Patient Active Problem List   Diagnosis Date Noted   Seizure ([place]) 08/07/2021   Postmenopausal bleeding 06/02/2021   Genetic testing 04/21/2021   Family history of breast cancer    Family history of bone cancer    Family history of prostate cancer    Family history of throat cancer    Malignant neoplasm of upper-outer quadrant of left breast in female, estrogen receptor positive ([place]) 04/08/2021   Women's annual routine gynecological examination 02/25/2021   Screening examination for STD (sexually transmitted disease) 02/25/2021   Skin lesion 02/25/2021   Chest pain at rest 05/30/2014    Ejection fraction    Abnormal EKG 04/24/2014   Chest discomfort 04/23/2014   Panic attack 04/23/2014   OBESITY 01/15/2009   HAIR LOSS 01/15/2009   ANEMIA-NOS 01/14/2009    Past Surgical History:  Procedure Laterality Date   AXILLARY LYMPH NODE BIOPSY Left 04/29/2021   Procedure: AXILLARY LYMPH NODE BIOPSY;  Surgeon: Rolm Bookbinder, MD;  Location: [facility];  Service: General;  Laterality: Left;   BREAST LUMPECTOMY WITH RADIOACTIVE SEED AND SENTINEL LYMPH NODE BIOPSY Left 04/29/2021   Procedure: LEFT BREAST LUMPECTOMY WITH RADIOACTIVE SEED;  Surgeon: Rolm Bookbinder, MD;  Location: [facility];  Service: General;  Laterality: Left;   CARDIAC CATHETERIZATION     LEFT HEART CATHETERIZATION WITH CORONARY ANGIOGRAM N/A 05/30/2014   Procedure: LEFT HEART CATHETERIZATION WITH CORONARY ANGIOGRAM;  Surgeon: Peter M Martinique, MD;  Location: [facility] CATH LAB;  Service: Cardiovascular;  Laterality: N/A;   TUBAL LIGATION       OB History     Gravida  8   Para  6   Term  4   Preterm  2   AB  2   Living  4      SAB  2   IAB      Ectopic      Multiple      Live Births              Family History  Problem Relation Age of Onset   Heart disease Mother    Hypertension  Mother    Heart Problems Mother    Hyperlipidemia Mother    Breast cancer Mother 43   Diabetes Father    Seizures Father    Heart disease Sister    Crohn's disease Sister    Crohn's disease Brother    Hypertension Maternal Aunt    Colon cancer Maternal Uncle    Hypertension Maternal Uncle    Prostate cancer Maternal Uncle    Prostate cancer Maternal Uncle    Cancer Paternal Aunt        unknown type   Cancer Paternal Uncle        unknown type   Cancer Paternal Uncle        unknown type   Throat cancer Paternal Uncle    Hypertension Maternal Grandmother    Stroke Maternal Grandmother    Breast cancer Maternal Grandmother 27   Stroke Maternal Grandfather    Pancreatic cancer Cousin    Breast cancer Cousin         dx 40s, bilateral mastectomies (maternal first cousin)   Bone cancer Nephew 11       knee, great-nephew   Breast cancer Other        great-aunt (MGF's sister)   Breast cancer Other        first cousin once removed (MGF's niece)   Breast cancer Other        first cousin once removed (MGF's niece)   Cancer Other        unknown type, great-uncle (MGM's brother)   Breast cancer Other        first cousin once removed (MGM's niece)   Breast cancer Other        first cousin once removed (MGM's niece)   Prostate cancer Other        first cousin once removed (MGM's nephew)   Breast cancer Other        second cousin (MGM's great-niece)   Cancer Other        unknown type, dx 62s (maternal aunt's granddaughter)   Sickle cell anemia Other    Esophageal cancer Neg Hx    Stomach cancer Neg Hx    Rectal cancer Neg Hx     Social History   Tobacco Use   Smoking status: Never   Smokeless tobacco: Never  Vaping Use   Vaping Use: Never used  Substance Use Topics   Alcohol use: No   Drug use: No    Home Medications Prior to Admission medications   Medication Sig Start Date End Date Taking? Authorizing Provider  tamoxifen (NOLVADEX) 20 MG tablet Take 1 tablet (20 mg total) by mouth daily. 07/02/21  Yes Truitt Merle, MD  triamcinolone cream (KENALOG) 0.1 % Apply topically 2 (two) times daily. 07/10/21  Yes [provider]  ibuprofen (ADVIL) 800 MG tablet Take 1 tablet (800 mg total) by mouth every 8 (eight) hours as needed for moderate pain. Patient not taking: No sig reported 05/20/21   Long, Wonda Olds, MD  prochlorperazine (COMPAZINE) 10 MG tablet Take 1 tablet (10 mg total) by mouth every 6 (six) hours as needed for nausea or vomiting. Patient not taking: No sig reported 06/12/21   Tyler Pita, MD    Allergies    Patient has no known allergies.  Review of Systems   Review of Systems  Unable to perform ROS: Mental status change  Neurological:  Positive for seizures.    Physical Exam Updated Vital Signs BP (!) 122/97   Pulse 74  Temp 98.4 F (36.9 C)   Resp 16   Ht 5' 9"  (1.753 m)   Wt 93.9 kg   LMP 04/03/2018 (Within Days)   SpO2 96%   BMI 30.57 kg/m   Physical Exam Vitals and nursing note reviewed.  Constitutional:      General: She is not in acute distress.    Appearance: She is well-developed.  HENT:     Head: Normocephalic and atraumatic.  Eyes:     Extraocular Movements: Extraocular movements intact.     Conjunctiva/sclera: Conjunctivae normal.     Pupils: Pupils are equal, round, and reactive to light.     Comments: Pupils are equal, round, reactive to light, approximately 2 mm.  Cardiovascular:     Rate and Rhythm: Normal rate and regular rhythm.     Heart sounds: No murmur heard. Pulmonary:     Effort: Pulmonary effort is normal. No respiratory distress.     Breath sounds: Normal breath sounds.  Abdominal:     Palpations: Abdomen is soft.     Tenderness: There is no abdominal tenderness.  Musculoskeletal:     Cervical back: Neck supple.  Skin:    General: Skin is warm and dry.     Capillary Refill: Capillary refill takes less than 2 seconds.  Neurological:     GCS: GCS eye subscore is 3. GCS verbal subscore is 4. GCS motor subscore is 6.     Comments: Patient open eyes to voice, follows commands with repetitive prompting, will answer her name but unable to answer further questions.  Moves all 4 extremities to command.  Withdraws from pain in all 4 extremities.    ED Results / Procedures / Treatments   Labs (all labs ordered are listed, but only abnormal results are displayed) Labs Reviewed  COMPREHENSIVE METABOLIC PANEL - Abnormal; Notable for the following components:      Result Value   Calcium 8.4 (*)    Total Protein 6.2 (*)    Albumin 3.3 (*)    Total Bilirubin 1.8 (*)    All other components within normal limits  CBC WITH DIFFERENTIAL/PLATELET - Abnormal; Notable for the following components:   WBC 3.8 (*)     Hemoglobin 11.6 (*)    HCT 35.9 (*)    All other components within normal limits  URINALYSIS, COMPLETE (UACMP) WITH MICROSCOPIC - Abnormal; Notable for the following components:   Color, Urine STRAW (*)    Specific Gravity, Urine 1.004 (*)    All other components within normal limits  LACTIC ACID, PLASMA - Abnormal; Notable for the following components:   Lactic Acid, Venous 2.2 (*)    All other components within normal limits  RAPID URINE DRUG SCREEN, HOSP PERFORMED - Abnormal; Notable for the following components:   Benzodiazepines POSITIVE (*)    All other components within normal limits  I-STAT VENOUS BLOOD GAS, ED - Abnormal; Notable for the following components:   pCO2, Ven 43.1 (*)    Calcium, Ion 1.10 (*)    HCT 35.0 (*)    Hemoglobin 11.9 (*)    All other components within normal limits  RESP PANEL BY RT-PCR (FLU A&B, COVID) ARPGX2  ETHANOL  AMMONIA  CBC  BASIC METABOLIC PANEL  CBG MONITORING, ED  I-STAT BETA HCG BLOOD, ED ([facility], [facility], [facility] ONLY)   EKG EKG Interpretation  Date/Time:  Friday August 07 2021 15:51:56 EDT Ventricular Rate:  94 PR Interval:  122 QRS Duration: 86 QT Interval:  375 QTC Calculation:  469 R Axis:   7 Text Interpretation: Sinus rhythm Probable left atrial enlargement Low voltage, precordial leads Nonspecific T abnormalities, lateral leads No significant change since last tracing Confirmed by Wandra Arthurs [phone]) on 08/07/2021 3:53:56 PM  Radiology CT HEAD WO CONTRAST  Result Date: 08/07/2021 CLINICAL DATA:  Seizure. EXAM: CT HEAD WITHOUT CONTRAST TECHNIQUE: Contiguous axial images were obtained from the base of the skull through the vertex without intravenous contrast. COMPARISON:  September 15, 2017 FINDINGS: Brain: No evidence of acute infarction, hemorrhage, hydrocephalus, extra-axial collection or mass lesion/mass effect. Vascular: No hyperdense vessel or unexpected calcification. Skull: Normal. Negative for fracture or focal lesion.  Sinuses/Orbits: No acute finding. Other: None. IMPRESSION: No acute intracranial pathology. Electronically Signed   By: Virgina Norfolk M.D.   On: 08/07/2021 17:07   EEG adult  Result Date: 08/07/2021 Willaim Rayas, MD     08/07/2021  9:12 PM [facility] [employer] Stat EEG Report 20 Minute Date of Study:   08/07/2021 20:53:00 Indication: Spells, Eval for Seizures, Technical Summary: A routine 20 channel electroencephalogram using the international 10-20 system of electrode placement was performed. Background: 9-10 Hz, Posterior dominant rhythm that attenuated with eye Opening States      Awake      Drowsy: were seen during drowsiness      Asleep: were seen during asleep Activation Procedures Hyperventilation: Not performed Photic Stimulation: Not performed Classification: Normal : There were no Epileptiform discharges Diagnosis: Normal Awake study. There are no epileptiform discharges. Clinical Correlation: This is a normal study. The absence of interictal epileptiform abnormalities does not exclude the diagnosis of a seizure disorder. Dr Apolinar Junes [employer] [phone] Case [id number]    Procedures Procedures   Medications Ordered in ED Medications  chlorhexidine gluconate (MEDLINE KIT) (PERIDEX) 0.12 % solution 15 mL (15 mLs Mouth Rinse Not Given 08/07/21 2135)  MEDLINE mouth rinse (15 mLs Mouth Rinse Not Given 08/07/21 2356)  0.9 %  sodium chloride infusion (75 mL/hr Intravenous New Bag/Given 08/07/21 2110)  enoxaparin (LOVENOX) injection 40 mg (40 mg Subcutaneous Given 08/07/21 2110)  LORazepam (ATIVAN) injection 4 mg (has no administration in time range)  0.9 %  sodium chloride infusion ( Intravenous New Bag/Given 08/07/21 2106)  lactated ringers bolus 1,000 mL (0 mLs Intravenous Stopped 08/07/21 1813)  levETIRAcetam (KEPPRA) IVPB 1500 mg/ 100 mL premix (0 mg Intravenous Stopped 08/07/21 1706)  sodium chloride 0.9 % bolus 1,000 mL (1,000 mLs  Intravenous New Bag/Given 08/07/21 1814)    ED Course  I have reviewed the triage vital signs and the nursing notes.  Pertinent labs & imaging results that were available during my care of the patient were reviewed by me and considered in my medical decision making (see chart for details).    MDM Rules/Calculators/A&P                           50 y.o. female with past medical history as above who presents for evaluation of seizure-like activity. Afebrile and hemodynamically stable.  Exam as detailed above.  While in the emergency department, patient had several additional episodes of whole body shaking with arms flailing gait and pelvic thrusting.  Eyes closed forcefully with no gaze deviation.  Episode terminated prior to administration of medication.  Patient is now resting in bed, with eyes closed, disoriented.  Upon serial reassessments, her mental status improved and she is now answering questions and following commands.  Neurology was consulted  for further recommendations, who agreed to evaluate the patient at bedside.  They feel that her symptoms appear atypical for seizure morphology. They recommended the patient be loaded with Keppra, as well as given benzodiazepines for seizures lasting more than 5 minutes.  They will plan for a stat EEG and MRI brain when able.  Case was discussed with hospitalist, who agreed to admit the patient for further management.  Final Clinical Impression(s) / ED Diagnoses Final diagnoses:  Seizure-like activity [facility])    Rx / [place] Orders ED Discharge Orders     None           Violet Baldy, MD 08/08/21 [place]    Drenda Freeze, MD 08/08/21 1459

## 2021-08-07 NOTE — Progress Notes (Signed)
PT taken to PACU following procedure. Monitors in place. VSS. Responding appropriately to verbal stimulus. No S/S of distress. Report given to RN.

## 2021-08-07 NOTE — ED Notes (Signed)
Pt has had 3 tonic clonic seizures since arrival lasting less than 15 seconds

## 2021-08-07 NOTE — ED Notes (Signed)
Tech in to do eeg now

## 2021-08-07 NOTE — Progress Notes (Signed)
History reviewed today 

## 2021-08-07 NOTE — Progress Notes (Addendum)
LTM EEG hooked up and running - no initial skin breakdown - push button tested - neuro notified.  

## 2021-08-07 NOTE — Progress Notes (Signed)
EEG complete - results pending 

## 2021-08-07 NOTE — ED Triage Notes (Signed)
Pt status post colonoscopy at outpatient center. Pt has a grand mal seizure in recovery. Received 4 mg versed at out patient. PT has 2 more grand mal seizures witness by EMS and was given 2 2.5 mg doses of IV versed. No prior hx of seizure.

## 2021-08-08 ENCOUNTER — Observation Stay (HOSPITAL_COMMUNITY): Payer: Medicaid Other

## 2021-08-08 ENCOUNTER — Encounter (HOSPITAL_COMMUNITY): Payer: Self-pay | Admitting: Internal Medicine

## 2021-08-08 ENCOUNTER — Other Ambulatory Visit: Payer: Self-pay

## 2021-08-08 DIAGNOSIS — I2510 Atherosclerotic heart disease of native coronary artery without angina pectoris: Secondary | ICD-10-CM | POA: Diagnosis present

## 2021-08-08 DIAGNOSIS — Z820 Family history of epilepsy and other diseases of the nervous system: Secondary | ICD-10-CM | POA: Diagnosis not present

## 2021-08-08 DIAGNOSIS — R5381 Other malaise: Secondary | ICD-10-CM | POA: Diagnosis present

## 2021-08-08 DIAGNOSIS — Z20822 Contact with and (suspected) exposure to covid-19: Secondary | ICD-10-CM | POA: Diagnosis present

## 2021-08-08 DIAGNOSIS — E872 Acidosis: Secondary | ICD-10-CM | POA: Diagnosis present

## 2021-08-08 DIAGNOSIS — Z6830 Body mass index (BMI) 30.0-30.9, adult: Secondary | ICD-10-CM | POA: Diagnosis not present

## 2021-08-08 DIAGNOSIS — Z832 Family history of diseases of the blood and blood-forming organs and certain disorders involving the immune mechanism: Secondary | ICD-10-CM | POA: Diagnosis not present

## 2021-08-08 DIAGNOSIS — Z808 Family history of malignant neoplasm of other organs or systems: Secondary | ICD-10-CM | POA: Diagnosis not present

## 2021-08-08 DIAGNOSIS — R569 Unspecified convulsions: Secondary | ICD-10-CM | POA: Diagnosis present

## 2021-08-08 DIAGNOSIS — Z800 Family history of malignant neoplasm of digestive organs: Secondary | ICD-10-CM | POA: Diagnosis not present

## 2021-08-08 DIAGNOSIS — F410 Panic disorder [episodic paroxysmal anxiety] without agoraphobia: Secondary | ICD-10-CM | POA: Diagnosis present

## 2021-08-08 DIAGNOSIS — Z923 Personal history of irradiation: Secondary | ICD-10-CM | POA: Diagnosis not present

## 2021-08-08 DIAGNOSIS — Z853 Personal history of malignant neoplasm of breast: Secondary | ICD-10-CM | POA: Diagnosis not present

## 2021-08-08 DIAGNOSIS — M79601 Pain in right arm: Secondary | ICD-10-CM | POA: Diagnosis not present

## 2021-08-08 DIAGNOSIS — E876 Hypokalemia: Secondary | ICD-10-CM | POA: Diagnosis present

## 2021-08-08 DIAGNOSIS — Z823 Family history of stroke: Secondary | ICD-10-CM | POA: Diagnosis not present

## 2021-08-08 DIAGNOSIS — Z170 Estrogen receptor positive status [ER+]: Secondary | ICD-10-CM | POA: Diagnosis not present

## 2021-08-08 DIAGNOSIS — Z8249 Family history of ischemic heart disease and other diseases of the circulatory system: Secondary | ICD-10-CM | POA: Diagnosis not present

## 2021-08-08 DIAGNOSIS — D72819 Decreased white blood cell count, unspecified: Secondary | ICD-10-CM | POA: Diagnosis present

## 2021-08-08 DIAGNOSIS — E669 Obesity, unspecified: Secondary | ICD-10-CM | POA: Diagnosis present

## 2021-08-08 DIAGNOSIS — Z833 Family history of diabetes mellitus: Secondary | ICD-10-CM | POA: Diagnosis not present

## 2021-08-08 DIAGNOSIS — C50412 Malignant neoplasm of upper-outer quadrant of left female breast: Secondary | ICD-10-CM | POA: Diagnosis present

## 2021-08-08 DIAGNOSIS — Z83438 Family history of other disorder of lipoprotein metabolism and other lipidemia: Secondary | ICD-10-CM | POA: Diagnosis not present

## 2021-08-08 DIAGNOSIS — Z803 Family history of malignant neoplasm of breast: Secondary | ICD-10-CM | POA: Diagnosis not present

## 2021-08-08 DIAGNOSIS — Z8042 Family history of malignant neoplasm of prostate: Secondary | ICD-10-CM | POA: Diagnosis not present

## 2021-08-08 LAB — CBC
HCT: 33.1 % — ABNORMAL LOW (ref 36.0–46.0)
Hemoglobin: 10.9 g/dL — ABNORMAL LOW (ref 12.0–15.0)
MCH: 29.7 pg (ref 26.0–34.0)
MCHC: 32.9 g/dL (ref 30.0–36.0)
MCV: 90.2 fL (ref 80.0–100.0)
Platelets: 225 10*3/uL (ref 150–400)
RBC: 3.67 MIL/uL — ABNORMAL LOW (ref 3.87–5.11)
RDW: 12.5 % (ref 11.5–15.5)
WBC: 3.3 10*3/uL — ABNORMAL LOW (ref 4.0–10.5)
nRBC: 0 % (ref 0.0–0.2)

## 2021-08-08 LAB — RAPID URINE DRUG SCREEN, HOSP PERFORMED
Amphetamines: NOT DETECTED
Barbiturates: NOT DETECTED
Benzodiazepines: POSITIVE — AB
Cocaine: NOT DETECTED
Opiates: NOT DETECTED
Tetrahydrocannabinol: NOT DETECTED

## 2021-08-08 LAB — BASIC METABOLIC PANEL
Anion gap: 7 (ref 5–15)
BUN: <5 mg/dL — ABNORMAL LOW (ref 6–20)
CO2: 22 mmol/L (ref 22–32)
Calcium: 8.3 mg/dL — ABNORMAL LOW (ref 8.9–10.3)
Chloride: 112 mmol/L — ABNORMAL HIGH (ref 98–111)
Creatinine, Ser: 0.77 mg/dL (ref 0.44–1.00)
GFR, Estimated: >60 mL/min (ref >60)
Glucose, Bld: 90 mg/dL (ref 70–99)
Potassium: 3.4 mmol/L — ABNORMAL LOW (ref 3.5–5.1)
Sodium: 141 mmol/L (ref 135–145)

## 2021-08-08 LAB — LACTIC ACID, PLASMA: Lactic Acid, Venous: 1.2 mmol/L (ref 0.5–1.9)

## 2021-08-08 MED ORDER — ENSURE ENLIVE PO LIQD: 237.0000 mL | Freq: Two times a day (BID) | ORAL | Status: DC

## 2021-08-08 MED ORDER — GADOBUTROL 1 MMOL/ML IV SOLN
9.0000 mL | Freq: Once | INTRAVENOUS | Status: AC | PRN
  Administered 2021-08-08: 9 mL via INTRAVENOUS

## 2021-08-08 MED ORDER — LEVETIRACETAM IN NACL 500 MG/100ML IV SOLN
500.0000 mg | Freq: Two times a day (BID) | INTRAVENOUS | Status: DC
  Administered 2021-08-08 (×2): 500 mg via INTRAVENOUS
  Filled 2021-08-08 (×3): qty 100

## 2021-08-08 MED ORDER — ACETAMINOPHEN 325 MG PO TABS
650.0000 mg | ORAL_TABLET | Freq: Four times a day (QID) | ORAL | Status: DC | PRN
  Administered 2021-08-08 – 2021-08-09 (×2): 650 mg via ORAL
  Filled 2021-08-08 (×2): qty 2

## 2021-08-08 MED ORDER — POTASSIUM CHLORIDE 10 MEQ/100ML IV SOLN
10.0000 meq | INTRAVENOUS | Status: AC
Start: 2021-08-08 — End: 2021-08-08
  Administered 2021-08-08: 10 meq via INTRAVENOUS
  Filled 2021-08-08: qty 100

## 2021-08-08 MED ORDER — POLYVINYL ALCOHOL 1.4 % OP SOLN: 1.0000 [drp] | OPHTHALMIC | Status: DC | PRN

## 2021-08-08 NOTE — Progress Notes (Signed)
OT Cancellation Note  Patient Details Name: Sheila [name] MRN: [id number] DOB: [birth year]   Cancelled Treatment:    Reason Eval/Treat Not Completed: Patient at procedure or test/ unavailable (Pt at MRI; OT evaluation to f/u as appropriate.)  Roseann Kees A Shandell Jallow 08/08/2021, 12:58 PM

## 2021-08-08 NOTE — Progress Notes (Signed)
PT Cancellation Note  Patient Details Name: Sheila [name] MRN: [id number] DOB: 05/08/1971   Cancelled Treatment:    Reason Eval/Treat Not Completed: Patient at procedure or test/unavailable (MRI).  Wyona Almas, PT, DPT Acute Rehabilitation Services Pager [phone] Office [phone]    Deno Etienne 08/08/2021, 12:53 PM

## 2021-08-08 NOTE — Progress Notes (Signed)
EEG LTM disconnected.Results pending.

## 2021-08-08 NOTE — Evaluation (Signed)
Physical Therapy Evaluation Patient Details Name: Sheila [name] MRN: [id number] DOB: 21-Mar-1971 Today's Date: 08/08/2021  History of Present Illness  Pt is a 50 y.o. F who presents with multiple seizures after her colonoscopy. LTM EEG with no seizures overnight. MRI negative for acute abnormality. Significant PMH: breast CA with mets.  Clinical Impression  PTA, pt lives with her daughter and is independent. Pt reporting generalized pain, right eye diplopia, and nausea/dizziness. Pt with noted RUE weakness and right eye superior field deficit. Able to stand from ED stretcher with min guard assist, however, requested to lie back down due to increase in symptoms. Suspect pt will progress well, but will need to reassess. Will likely benefit from occluded glasses (OT notified).     Recommendations for follow up therapy are one component of a multi-disciplinary discharge planning process, led by the attending physician.  Recommendations may be updated based on patient status, additional functional criteria and insurance authorization.  Follow Up Recommendations Outpatient PT;Supervision for mobility/OOB (neuro)    Equipment Recommendations  Other (comment) (TBA)    Recommendations for Other Services       Precautions / Restrictions Precautions Precautions: Fall;Other (comment) Precaution Comments: R eye diplopia, seizure Restrictions Weight Bearing Restrictions: No      Mobility  Bed Mobility Overal bed mobility: Modified Independent                  Transfers Overall transfer level: Needs assistance Equipment used: None Transfers: Sit to/from Stand Sit to Stand: Min guard         General transfer comment: Min guard to rise to standing  Ambulation/Gait             General Gait Details: deferred due to pt nausea/dizziness  Stairs            Wheelchair Mobility    Modified Rankin (Stroke Patients Only)       Balance Overall balance assessment:  Needs assistance Sitting-balance support: Feet supported Sitting balance-Leahy Scale: Good     Standing balance support: No upper extremity supported;During functional activity Standing balance-Leahy Scale: Fair                               Pertinent Vitals/Pain Pain Assessment: Faces Faces Pain Scale: Hurts even more Pain Location: generalized, RUE Pain Descriptors / Indicators: Aching;Constant;Grimacing Pain Intervention(s): Monitored during session;Limited activity within patient's tolerance    Home Living Family/patient expects to be discharged to:: Private residence Living Arrangements: Children (daughter) Available Help at Discharge: Family Type of Home: House Home Access: Stairs to enter Entrance Stairs-Rails: None Technical brewer of Steps: 3 Home Layout: One level        Prior Function Level of Independence: Independent         Comments: has not driven in ~4 years     Hand Dominance        Extremity/Trunk Assessment   Upper Extremity Assessment Upper Extremity Assessment: RUE deficits/detail;LUE deficits/detail RUE Deficits / Details: Grossly 4/5, weak hand grip LUE Deficits / Details: Strength 5/5    Lower Extremity Assessment Lower Extremity Assessment: RLE deficits/detail;LLE deficits/detail RLE Deficits / Details: Strength 5/5 LLE Deficits / Details: Strength 5/5    Cervical / Trunk Assessment Cervical / Trunk Assessment: Normal  Communication   Communication: No difficulties  Cognition Arousal/Alertness: Awake/alert Behavior During Therapy: WFL for tasks assessed/performed Overall Cognitive Status: Within Functional Limits for tasks assessed  General Comments      Exercises     Assessment/Plan    PT Assessment Patient needs continued PT services  PT Problem List Decreased strength;Decreased activity tolerance;Decreased balance;Decreased mobility;Pain        PT Treatment Interventions DME instruction;Gait training;Stair training;Functional mobility training;Therapeutic activities;Therapeutic exercise;Balance training;Patient/family education    PT Goals (Current goals can be found in the Care Plan section)  Acute Rehab PT Goals Patient Stated Goal: less pain PT Goal Formulation: With patient Time For Goal Achievement: 08/22/21 Potential to Achieve Goals: Good    Frequency Min 3X/week   Barriers to discharge        Co-evaluation               AM-PAC PT "6 Clicks" Mobility  Outcome Measure Help needed turning from your back to your side while in a flat bed without using bedrails?: None Help needed moving from lying on your back to sitting on the side of a flat bed without using bedrails?: None Help needed moving to and from a bed to a chair (including a wheelchair)?: A Little Help needed standing up from a chair using your arms (e.g., wheelchair or bedside chair)?: A Little Help needed to walk in hospital room?: A Little Help needed climbing 3-5 steps with a railing? : A Lot 6 Click Score: 19    End of Session Equipment Utilized During Treatment: Gait belt Activity Tolerance: Other (comment) (limited by dizziness/nausea/pain) Patient left: in bed;with call bell/phone within reach;with family/visitor present Nurse Communication: Mobility status PT Visit Diagnosis: Unsteadiness on feet (R26.81);Difficulty in walking, not elsewhere classified (R26.2)    Time: 2831-5176 PT Time Calculation (min) (ACUTE ONLY): 12 min   Charges:   PT Evaluation $PT Eval Low Complexity: [place], PT, DPT Acute Rehabilitation Services Pager [phone] Office [phone]   Deno Etienne 08/08/2021, 4:33 PM

## 2021-08-08 NOTE — Progress Notes (Signed)
PROGRESS NOTE    Sheila [name]  WLN:[id number] DOB: April 19, 1971 DOA: 08/07/2021 PCP: Griffin Basil, MD   Chief Complaint  Patient presents with   Seizures   Brief Narrative/Hospital Course: 50 YOF with history of breast cancer stage Ia with lumpectomy and radiation therapy, nonobstructive CAD, family history of seizure presented with new onset seizure following colonoscopy. She had a elective colonoscopy  9/23  afternoon for anemia screening, under general anesthesia with propofol.  Right after the procedure, in the recovery room patient had 2 episodes of generalized seizure with witnessed whole body shaking and unresponsive.  Was given Versed 2 times and shifted to ED.  The patient had another 2-3 episodes of seizures in the ED and received a total of 9 mg IV Versed. Initially she was unresponsive/set in the ED, underwent further work-up CT head no acute finding given Keppra loading dose and placed on continuous EEG and admitted  Subjective:  Complains of double vision on the right eye versus diminished vision on the right eye Feels pain all over alert awake oriented Moving all her extremities  Assessment & Plan:  New onset seizure: Seizure following colonoscopy diagnosis and recovery.  CT head no acute finding.  EEG no status noted.  Continue Ativan as needed, plan as per neurology-loaded with Keppra 3 g and advised to keep 500 twice daily, MRI brain with and without ordered to rule out mets. Keep on seizure precaution fall precaution, cannot drive for 6 months until cleared by neurology.  UDS positive for benzo ( 2/2 versed).  AMS/acute metabolic encephalopathy due to seizure as well as Versed use.Supportive care  Breast cancer stage Ia ER positive with recent lumpectomy and radiation: Outpatient oncology follow-up to start tamoxifen  Lactic acidosis likely from seizure.  Recheck to make sure its resolves  Hypokalemia replete Mild leukopenia monitor Obesity Class I Patient's  Body mass index is 30.57 kg/m. : Will benefit with PCP follow-up, weight loss healthy lifestyle and outpatient sleep evaluation  Debility/deconditioning obtain PT OT evaluation  FEN: Diet Order     None      DVT prophylaxis: enoxaparin (LOVENOX) injection 40 mg Start: 08/07/21 1900 Code Status:   Code Status: Full Code  Family Communication: plan of care discussed with patient and daughter at bedside. Status is: Observation Remains hospitalized for ongoing work-up of seizure, and will need at least 2 midnight stay Dispo: The patient is from: Home              Anticipated d/c is to: Home              Patient currently is not medically stable to d/c.   Difficult to place patient No  Objective: Vitals: Today's Vitals   08/08/21 0800 08/08/21 0806 08/08/21 0900 08/08/21 1000  BP: 126/90  (!) 127/100 (!) 123/104  Pulse: 67  75 69  Resp: 13  17 16   Temp:      SpO2: 97%  98% 99%  Weight:      Height:      PainSc:  10-Worst pain ever     Examination:  General exam: AA0x3, obese, weak,older than stated age HEENT:Oral mucosa moist, Ear/Nose WNL grossly,dentition normal. Respiratory system: bilaterally diminished,no use of accessory muscle, non tender. Cardiovascular system: S1 & S2 +,No JVD. Gastrointestinal system: Abdomen soft, NT,ND, BS+. Nervous System:Alert, awake, moving extremities Extremities: no edema, distal peripheral pulses palpable.  Skin: No rashes,no icterus. MSK: Normal muscle bulk,tone, power   Intake/Output Summary (Last 24 hours)  at 08/08/2021 1027 Last data filed at 08/08/2021 6333 Gross per 24 hour  Intake 2100 ml  Output --  Net 2100 ml   Filed Weights   08/07/21 1555  Weight: 93.9 kg   Weight change:    Consultants:see note  Procedures:see note Antimicrobials: Anti-infectives (From admission, onward)    None      Culture/Microbiology none Medications reviewed: Scheduled Meds:  chlorhexidine gluconate (MEDLINE KIT)  15 mL Mouth Rinse  BID   enoxaparin (LOVENOX) injection  40 mg Subcutaneous Q24H   mouth rinse  15 mL Mouth Rinse 10 times per day   Continuous Infusions:  sodium chloride     sodium chloride Stopped (08/08/21 0347)   sodium chloride Stopped (08/08/21 0733)   levETIRAcetam     potassium chloride       Intake/Output from previous day: 09/23 0701 - 09/24 0700 In: 1100 [IV Piggyback:1100] Out: -  Intake/Output this shift: Total I/O In: 1000 [I.V.:1000] Out: -  Filed Weights   08/07/21 1555  Weight: 93.9 kg   Data Reviewed: I have personally reviewed following labs and imaging studies CBC: Recent Labs  Lab 08/07/21 1913 08/07/21 1914 08/08/21 0359  WBC 3.8*  --  3.3*  NEUTROABS 2.4  --   --   HGB 11.6* 11.9* 10.9*  HCT 35.9* 35.0* 33.1*  MCV 91.1  --  90.2  PLT 179  --  545   Basic Metabolic Panel: Recent Labs  Lab 08/07/21 1913 08/07/21 1914 08/08/21 0359  NA 141 144 141  K 3.9 3.9 3.4*  CL 111  --  112*  CO2 22  --  22  GLUCOSE 83  --  90  BUN 6  --  <5*  CREATININE 0.82  --  0.77  CALCIUM 8.4*  --  8.3*   GFR: Estimated Creatinine Clearance: 100.4 mL/min (by C-G formula based on SCr of 0.77 mg/dL). Liver Function Tests: Recent Labs  Lab 08/07/21 1913  AST 28  ALT 18  ALKPHOS 53  BILITOT 1.8*  PROT 6.2*  ALBUMIN 3.3*   No results for input(s): LIPASE, AMYLASE in the last 168 hours. Recent Labs  Lab 08/07/21 1930  AMMONIA 33   Coagulation Profile: No results for input(s): INR, PROTIME in the last 168 hours. Cardiac Enzymes: No results for input(s): CKTOTAL, CKMB, CKMBINDEX, TROPONINI in the last 168 hours. BNP (last 3 results) No results for input(s): PROBNP in the last 8760 hours. HbA1C: No results for input(s): HGBA1C in the last 72 hours. CBG: Recent Labs  Lab 08/07/21 1806  GLUCAP 74   Lipid Profile: No results for input(s): CHOL, HDL, LDLCALC, TRIG, CHOLHDL, LDLDIRECT in the last 72 hours. Thyroid Function Tests: No results for input(s): TSH,  T4TOTAL, FREET4, T3FREE, THYROIDAB in the last 72 hours. Anemia Panel: No results for input(s): VITAMINB12, FOLATE, FERRITIN, TIBC, IRON, RETICCTPCT in the last 72 hours. Sepsis Labs: Recent Labs  Lab 08/07/21 1931  LATICACIDVEN 2.2*    Recent Results (from the past 240 hour(s))  Resp Panel by RT-PCR (Flu A&B, Covid) Nasopharyngeal Swab     Status: None   Collection Time: 08/07/21  4:00 PM   Specimen: Nasopharyngeal Swab; Nasopharyngeal(NP) swabs in vial transport medium  Result Value Ref Range Status   SARS Coronavirus 2 by RT PCR NEGATIVE NEGATIVE Final    Comment: (NOTE) SARS-CoV-2 target nucleic acids are NOT DETECTED.  The SARS-CoV-2 RNA is generally detectable in upper respiratory specimens during the acute phase of infection. The lowest concentration of  SARS-CoV-2 viral copies this assay can detect is 138 copies/mL. A negative result does not preclude SARS-Cov-2 infection and should not be used as the sole basis for treatment or other patient management decisions. A negative result may occur with  improper specimen collection/handling, submission of specimen other than nasopharyngeal swab, presence of viral mutation(s) within the areas targeted by this assay, and inadequate number of viral copies(<138 copies/mL). A negative result must be combined with clinical observations, patient history, and epidemiological information. The expected result is Negative.  Fact Sheet for Patients:  EntrepreneurPulse.com.au  Fact Sheet for Healthcare Providers:  IncredibleEmployment.be  This test is no t yet approved or cleared by the [place] FDA and  has been authorized for detection and/or diagnosis of SARS-CoV-2 by FDA under an Emergency Use Authorization (EUA). This EUA will remain  in effect (meaning this test can be used) for the duration of the COVID-19 declaration under Section 564(b)(1) of the Act, 21 U.S.C.section 360bbb-3(b)(1),  unless the authorization is terminated  or revoked sooner.       Influenza A by PCR NEGATIVE NEGATIVE Final   Influenza B by PCR NEGATIVE NEGATIVE Final    Comment: (NOTE) The Xpert Xpress SARS-CoV-2/FLU/RSV plus assay is intended as an aid in the diagnosis of influenza from Nasopharyngeal swab specimens and should not be used as a sole basis for treatment. Nasal washings and aspirates are unacceptable for Xpert Xpress SARS-CoV-2/FLU/RSV testing.  Fact Sheet for Patients: EntrepreneurPulse.com.au  Fact Sheet for Healthcare Providers: IncredibleEmployment.be  This test is not yet approved or cleared by the [place] FDA and has been authorized for detection and/or diagnosis of SARS-CoV-2 by FDA under an Emergency Use Authorization (EUA). This EUA will remain in effect (meaning this test can be used) for the duration of the COVID-19 declaration under Section 564(b)(1) of the Act, 21 U.S.C. section 360bbb-3(b)(1), unless the authorization is terminated or revoked.  Performed at [facility], [place] [address] [place], [place] [address]      Radiology Studies: CT HEAD WO CONTRAST  Result Date: 08/07/2021 CLINICAL DATA:  Seizure. EXAM: CT HEAD WITHOUT CONTRAST TECHNIQUE: Contiguous axial images were obtained from the base of the skull through the vertex without intravenous contrast. COMPARISON:  September 15, 2017 FINDINGS: Brain: No evidence of acute infarction, hemorrhage, hydrocephalus, extra-axial collection or mass lesion/mass effect. Vascular: No hyperdense vessel or unexpected calcification. Skull: Normal. Negative for fracture or focal lesion. Sinuses/Orbits: No acute finding. Other: None. IMPRESSION: No acute intracranial pathology. Electronically Signed   By: Virgina Norfolk M.D.   On: 08/07/2021 17:07   EEG adult  Result Date: 08/07/2021 Willaim Rayas, MD     08/07/2021  9:12 PM [facility]  Consult Services Stat EEG Report 20 Minute Date of Study:   08/07/2021 20:53:00 Indication: Spells, Eval for Seizures, Technical Summary: A routine 20 channel electroencephalogram using the international 10-20 system of electrode placement was performed. Background: 9-10 Hz, Posterior dominant rhythm that attenuated with eye Opening States      Awake      Drowsy: were seen during drowsiness      Asleep: were seen during asleep Activation Procedures Hyperventilation: Not performed Photic Stimulation: Not performed Classification: Normal : There were no Epileptiform discharges Diagnosis: Normal Awake study. There are no epileptiform discharges. Clinical Correlation: This is a normal study. The absence of interictal epileptiform abnormalities does not exclude the diagnosis of a seizure disorder. Dr Apolinar Junes [facility] [phone] Case [id number]   Overnight EEG  with video  Result Date: 08/08/2021 Lora Havens, MD     08/08/2021  8:48 AM Patient Name: KEMYA SHED MRN: [id number] Epilepsy Attending: Lora Havens Referring Physician/Provider: Dr Amie [place] Duration: 08/07/2021 2109 to 08/08/2021 0845 Patient history: 50 year old with history of breast cancer with sudden onset of generalized tonic-clonic seizure-like activity while in recovery after getting a colonoscopy. EEG to evaluate for seizure. Level of alertness: Awake, asleep AEDs during EEG study: None Technical aspects: This EEG study was done with scalp electrodes positioned according to the 10-20 International system of electrode placement. Electrical activity was acquired at a sampling rate of 500Hz  and reviewed with a high frequency filter of 70Hz  and a low frequency filter of 1Hz . EEG data were recorded continuously and digitally stored. Description: The posterior dominant rhythm consists of 15 Hz activity of beta activity seen predominantly in posterior head regions, symmetric and reactive to eye opening and eye closing. Sleep was  characterized by vertex waves, sleep spindles (12 to 14 Hz), maximal frontocentral region.  Hyperventilation and photic stimulation were not performed.   IMPRESSION: This study is within normal limits. The beta activity could be  seen due to benzodiapine use. No seizures or epileptiform discharges were seen throughout the recording. Priyanka Barbra Sarks     LOS: 0 days   Antonieta Pert, MD [facility]  08/08/2021, 10:27 AM

## 2021-08-08 NOTE — ED Notes (Signed)
Pt reports seeing double vision

## 2021-08-08 NOTE — ED Notes (Signed)
Pt has just returned to the ED from MRI.

## 2021-08-08 NOTE — ED Notes (Signed)
Physician at bedside.

## 2021-08-08 NOTE — ED Notes (Signed)
EDP aware that the potssium chloride was unable to be given due to the pt being in MRI at the time it was supposed to be hung.

## 2021-08-08 NOTE — ED Notes (Signed)
Pt alert and oriented x4 

## 2021-08-08 NOTE — ED Notes (Signed)
This RN called 2W to request the accepting RN to assign herself to the pt so the "purple man" process can begin & someone was heard in the background saying "I'm still looking at the chart."

## 2021-08-08 NOTE — Progress Notes (Addendum)
Neurology Progress Note   S:// Patient seen and examined. No seizures overnight Overnight LTM report with no seizures-see detailed report in the chart Complains of right posterior headache going into the neck and double vision overnight.  O:// Current vital signs: BP (!) 127/100   Pulse 75   Temp 98.4 F (36.9 C)   Resp 17   Ht _0  (1.753 m)   Wt 93.9 kg   LMP 04/03/2018 (Within Days)   SpO2 98%   BMI 30.57 kg/m  Vital signs in last 24 hours: Temp:  [97.5 F (36.4 C)-98.4 F (36.9 C)] 98.4 F (36.9 C) (09/23 1600) Pulse Rate:  [65-110] 75 (09/24 0900) Resp:  [11-23] 17 (09/24 0900) BP: (109-162)/(57-107) 127/100 (09/24 0900) SpO2:  [93 %-100 %] 98 % (09/24 0900) Weight:  [93.9 kg] 93.9 kg (09/23 1555) General: Awake alert in no distress HEENT: Normocephalic/atraumatic Lungs: Clear Cardiovascular: Regular rate rhythm Abdomen is soft nondistended nontender Neurological exam Awake alert oriented x3 No dysarthria No aphasia Cranials: Pupils are equal round reactive to light, there is questionable mild right ptosis, reports seeing double only from the right eye with no diplopia while following my finger to the left but on looking to the right only from the right eye sees objects as to-side-by-side, facial sensation intact, face symmetric, tongue and palate midline. Motor exam with no drift Sensation intact to light touch  Medications  Current Facility-Administered Medications:    0.9 %  sodium chloride infusion, 500 mL, Intravenous, Once, Irene Shipper, MD   0.9 %  sodium chloride infusion, 75 mL/hr, Intravenous, Continuous, Wynetta Fines T, MD, Stopped at 08/08/21 0347   0.9 %  sodium chloride infusion, , Intravenous, Continuous, Lequita Halt, MD, Stopped at 08/08/21 [phone]   acetaminophen (TYLENOL) tablet 650 mg, 650 mg, Oral, Q6H PRN, Kc, Ramesh, MD   chlorhexidine gluconate (MEDLINE KIT) (PERIDEX) 0.12 % solution 15 mL, 15 mL, Mouth Rinse, BID, Zhang, Pearletha Forge T, MD    enoxaparin (LOVENOX) injection 40 mg, 40 mg, Subcutaneous, Q24H, Wynetta Fines T, MD, 40 mg at 08/07/21 2110   levETIRAcetam (KEPPRA) IVPB 500 mg/100 mL premix, 500 mg, Intravenous, Q12H, Kc, Ramesh, MD   LORazepam (ATIVAN) injection 4 mg, 4 mg, Intravenous, Q5 Min x 2 PRN, Wynetta Fines T, MD   MEDLINE mouth rinse, 15 mL, Mouth Rinse, 10 times per day, Wynetta Fines T, MD   polyvinyl alcohol (LIQUIFILM TEARS) 1.4 % ophthalmic solution 1 drop, 1 drop, Both Eyes, PRN, Kc, Ramesh, MD   potassium chloride 10 mEq in 100 mL IVPB, 10 mEq, Intravenous, Q1 Hr x 2, Kc, Ramesh, MD  Current Outpatient Medications:    tamoxifen (NOLVADEX) 20 MG tablet, Take 1 tablet (20 mg total) by mouth daily., Disp: 30 tablet, Rfl: 3   triamcinolone cream (KENALOG) 0.1 %, Apply topically 2 (two) times daily., Disp: , Rfl:    ibuprofen (ADVIL) 800 MG tablet, Take 1 tablet (800 mg total) by mouth every 8 (eight) hours as needed for moderate pain. (Patient not taking: No sig reported), Disp: 21 tablet, Rfl: 0   prochlorperazine (COMPAZINE) 10 MG tablet, Take 1 tablet (10 mg total) by mouth every 6 (six) hours as needed for nausea or vomiting. (Patient not taking: No sig reported), Disp: 30 tablet, Rfl: 0 Labs CBC    Component Value Date/Time   WBC 3.3 (L) 08/08/2021 0359   RBC 3.67 (L) 08/08/2021 0359   HGB 10.9 (L) 08/08/2021 0359   HGB 12.2 04/15/2021 0834  HCT 33.1 (L) 08/08/2021 0359   PLT 225 08/08/2021 0359   PLT 276 04/15/2021 0834   MCV 90.2 08/08/2021 0359   MCH 29.7 08/08/2021 0359   MCHC 32.9 08/08/2021 0359   RDW 12.5 08/08/2021 0359   LYMPHSABS 0.9 08/07/2021 1913   MONOABS 0.4 08/07/2021 1913   EOSABS 0.1 08/07/2021 1913   BASOSABS 0.0 08/07/2021 1913    CMP     Component Value Date/Time   NA 141 08/08/2021 0359   NA 143 06/30/2020 1553   K 3.4 (L) 08/08/2021 0359   CL 112 (H) 08/08/2021 0359   CO2 22 08/08/2021 0359   GLUCOSE 90 08/08/2021 0359   BUN <5 (L) 08/08/2021 0359   BUN 8 06/30/2020  1553   CREATININE 0.77 08/08/2021 0359   CREATININE 0.95 04/15/2021 0834   CALCIUM 8.3 (L) 08/08/2021 0359   PROT 6.2 (L) 08/07/2021 1913   ALBUMIN 3.3 (L) 08/07/2021 1913   AST 28 08/07/2021 1913   AST 19 04/15/2021 0834   ALT 18 08/07/2021 1913   ALT 14 04/15/2021 0834   ALKPHOS 53 08/07/2021 1913   BILITOT 1.8 (H) 08/07/2021 1913   BILITOT 1.0 04/15/2021 0834   GFRNONAA >60 08/08/2021 0359   GFRNONAA >60 04/15/2021 0834   GFRAA 82 06/30/2020 1553    Overnight long-term EEG video monitoring-normal limits.  Some beta seen was due to benzo use.  No epileptiform discharges or seizures were seen throughout the recording.  Imaging I have reviewed images in epic and the results pertinent to this consultation are: CT head unremarkable   Assessment: 50 year old presenting with multiple seizures after her colonoscopy while she was in the recovery room.  Had multiple seizures in the emergency room as well. Semiology of the seizures is not very consistent with a neurological etiology but given that she has a history of breast cancer with mets, was deemed reasonable to admit for further work-up LTM EEG with no seizures overnight Complains of some neck pain, posterior neck headache and double vision only in the right eye. Imaging with MRI head was planned-would add neck imaging to rule out dissection because of the extremely violent nature of her seizing Etiology of her presentation remains elusive.  Adverse reaction to sedation versus lowering of seizure threshold in the setting of an acute procedure causing seizures versus psychogenic seizures remain the differentials  Recommendations: MRA head/neck to r/o dissection MRI brain w+w/o contrast If negative, no further w/u Symptomatic management of headache per primary team Discussed with Dr. Lupita Leash -- Amie Portland, MD Neurologist [facility] Pager: [phone]   Addendum MRI of the brain with nonspecific white matter  changes consistent with chronic small vessel disease.  No acute changes.  MRA of the head and neck also negative for dissection or significant occlusion. Differentials remain as above. No further neurological work-up or interventions at this time Symptomatic management of headache per primary team as you are Please call with questions as needed Plan relayed to Dr. Lupita Leash  -- Amie Portland, MD Neurologist [facility] Pager: [phone]

## 2021-08-08 NOTE — Procedures (Addendum)
Patient Name: Sheila [name]  MRN: [id number]  Epilepsy Attending: Lora Havens  Referring Physician/Provider: Dr Amie Portland Duration: 08/07/2021 2109 to 08/08/2021 6190  Patient history: 50 year old with history of breast cancer with sudden onset of generalized tonic-clonic seizure-like activity while in recovery after getting a colonoscopy. EEG to evaluate for seizure.  Level of alertness: Awake, asleep  AEDs during EEG study: None  Technical aspects: This EEG study was done with scalp electrodes positioned according to the 10-20 International system of electrode placement. Electrical activity was acquired at a sampling rate of 500Hz  and reviewed with a high frequency filter of 70Hz  and a low frequency filter of 1Hz . EEG data were recorded continuously and digitally stored.   Description: The posterior dominant rhythm consists of 15 Hz activity of beta activity seen predominantly in posterior head regions, symmetric and reactive to eye opening and eye closing. Sleep was characterized by vertex waves, sleep spindles (12 to 14 Hz), maximal frontocentral region.  Hyperventilation and photic stimulation were not performed.     IMPRESSION: This study is within normal limits. The beta activity could be  seen due to benzodiapine use. No seizures or epileptiform discharges were seen throughout the recording.  Shaina Gullatt Barbra Sarks

## 2021-08-08 NOTE — ED Notes (Signed)
Pt is still in MRI.

## 2021-08-08 NOTE — ED Notes (Signed)
Patient transported to MRI 

## 2021-08-09 ENCOUNTER — Inpatient Hospital Stay (HOSPITAL_COMMUNITY): Payer: Medicaid Other

## 2021-08-09 DIAGNOSIS — R569 Unspecified convulsions: Secondary | ICD-10-CM

## 2021-08-09 DIAGNOSIS — M79601 Pain in right arm: Secondary | ICD-10-CM | POA: Diagnosis not present

## 2021-08-09 LAB — BASIC METABOLIC PANEL
Anion gap: 8 (ref 5–15)
BUN: 7 mg/dL (ref 6–20)
CO2: 23 mmol/L (ref 22–32)
Calcium: 8.6 mg/dL — ABNORMAL LOW (ref 8.9–10.3)
Chloride: 112 mmol/L — ABNORMAL HIGH (ref 98–111)
Creatinine, Ser: 0.84 mg/dL (ref 0.44–1.00)
GFR, Estimated: >60 mL/min (ref >60)
Glucose, Bld: 85 mg/dL (ref 70–99)
Potassium: 3.5 mmol/L (ref 3.5–5.1)
Sodium: 143 mmol/L (ref 135–145)

## 2021-08-09 MED ORDER — METHOCARBAMOL 500 MG PO TABS: 500.0000 mg | ORAL_TABLET | Freq: Four times a day (QID) | ORAL | 0 refills | Status: DC | PRN

## 2021-08-09 MED ORDER — KETOROLAC TROMETHAMINE 15 MG/ML IJ SOLN: 15.0000 mg | Freq: Four times a day (QID) | INTRAMUSCULAR | Status: DC | PRN

## 2021-08-09 MED ORDER — IBUPROFEN 200 MG PO TABS
400.0000 mg | ORAL_TABLET | Freq: Four times a day (QID) | ORAL | Status: DC | PRN
  Filled 2021-08-09: qty 2

## 2021-08-09 MED ORDER — IBUPROFEN 200 MG PO TABS: 400.0000 mg | ORAL_TABLET | Freq: Four times a day (QID) | ORAL | Status: DC | PRN

## 2021-08-09 MED ORDER — KETOROLAC TROMETHAMINE 15 MG/ML IJ SOLN
15.0000 mg | Freq: Once | INTRAMUSCULAR | Status: AC
  Administered 2021-08-09: 15 mg via INTRAVENOUS
  Filled 2021-08-09: qty 1

## 2021-08-09 MED ORDER — METHOCARBAMOL 500 MG PO TABS: 750.0000 mg | ORAL_TABLET | Freq: Four times a day (QID) | ORAL | Status: DC | PRN

## 2021-08-09 MED ORDER — ENSURE ENLIVE PO LIQD: 237.0000 mL | Freq: Two times a day (BID) | ORAL | 0 refills | Status: AC

## 2021-08-09 NOTE — Evaluation (Signed)
Occupational Therapy Evaluation Patient Details Name: Sheila [name] MRN: [id number] DOB: 1971-08-20 Today's Date: 08/09/2021   History of Present Illness Pt is a 50 y.o. F who presents with multiple seizures after her colonoscopy. LTM EEG with no seizures overnight. MRI negative for acute abnormality. Significant PMH: breast CA with mets.   Clinical Impression   PTA patient was living with family in a private residence and was grossly I with ADLs/IADLs. Patient does not drive. Patient currently functioning below baseline demonstrating observed ADLs including LB dressing with set-up to Min A. Patient also limited by deficits listed below including continued dizziness, R-sided pain and weakness, diplopia, decreased balance and mild cognitive deficits including decreased STM and would benefit from continued acute OT services in prep for safe d/c home with family. Recommend near constant supervision/assist initially especially with mobility and ADL transfers. OT will continue to follow acutely.      Recommendations for follow up therapy are one component of a multi-disciplinary discharge planning process, led by the attending physician.  Recommendations may be updated based on patient status, additional functional criteria and insurance authorization.   Follow Up Recommendations  Outpatient OT;Supervision/Assistance - 24 hour;Other (comment) (Follow up with eye doctor for full visual field assessment)    Equipment Recommendations  3 in 1 bedside commode    Recommendations for Other Services       Precautions / Restrictions Precautions Precautions: Fall;Other (comment) Precaution Comments: R eye diplopia, seizure Restrictions Weight Bearing Restrictions: No      Mobility Bed Mobility Overal bed mobility: Modified Independent             General bed mobility comments: Increased time and use of rails to get to EOB. No dizziness.    Transfers Overall transfer level: Needs  assistance Equipment used: None Transfers: Sit to/from Stand Sit to Stand: Min guard         General transfer comment: Min guard to rise to standing    Balance Overall balance assessment: Needs assistance Sitting-balance support: Feet supported Sitting balance-Leahy Scale: Good     Standing balance support: No upper extremity supported;During functional activity Standing balance-Leahy Scale: Poor Standing balance comment: Requires external support in standing due to dizziness.                           ADL either performed or assessed with clinical judgement   ADL Overall ADL's : Needs assistance/impaired Eating/Feeding: Set up Eating/Feeding Details (indicate cue type and reason): Notes some difficulty managing utensils with use of RUE. Difficulty maintaining grip 2/2 weakness/pain. Can utilize LUE to compensate. Will continue to assess need for AE. Grooming: Sitting;Set up           Upper Body Dressing : Set up;Sitting   Lower Body Dressing: Minimal assistance;Sit to/from stand Lower Body Dressing Details (indicate cue type and reason): Able to doff/don R sock; cues for use of R hand to further assess coordination. Able to complete task with increased time. Patient reports pain with use of R hand.                     Vision Baseline Vision/History: 0 No visual deficits Ability to See in Adequate Light: 0 Adequate Patient Visual Report: Diplopia;Blurring of vision Vision Assessment?: Vision impaired- to be further tested in functional context Additional Comments: Able to track in all quadrants, no apparent visual field cut; unable to report when double vision occurs. Asked patient to  note when double vision appears throughout the day. Will continue to assess vision.     Perception     Praxis      Pertinent Vitals/Pain Pain Assessment: 0-10 Pain Score: 10-Worst pain ever Faces Pain Scale: Hurts whole lot Pain Location: "whole R side" Pain  Descriptors / Indicators: Aching;Constant;Grimacing;Guarding Pain Intervention(s): Limited activity within patient's tolerance;Monitored during session;Repositioned     Hand Dominance Right   Extremity/Trunk Assessment Upper Extremity Assessment Upper Extremity Assessment: RUE deficits/detail RUE Deficits / Details: Requires active assist to flex shoulder >50 degrees; AROM at elbow, wrist and digits WFL but patient reports pain with movement. Difficulty to assess MMT 2/2 pain. RUE: Unable to fully assess due to pain RUE Sensation: decreased light touch (Reports numbness/ tingling in RUE) RUE Coordination: decreased fine motor LUE Deficits / Details: Strength 5/5       Cervical / Trunk Assessment Cervical / Trunk Assessment: Normal   Communication Communication Communication: No difficulties   Cognition Arousal/Alertness: Awake/alert Behavior During Therapy: WFL for tasks assessed/performed Overall Cognitive Status: Impaired/Different from baseline Area of Impairment: Memory;Problem solving                     Memory: Decreased short-term memory       Problem Solving: Slow processing;Requires verbal cues General Comments: Patient with poor attention to task, decreased STM and decreased awareness requiring some redirection during formal assessment of vision and strength. Patient often states "I don't know what I'm doing".   General Comments  Husband present at bedside. Patient with elevated BP with SBP in 160's and DBP in 90's. RN aware.    Exercises Exercises: Other exercises Other Exercises Other Exercises: R hand [place]lin exercises   Shoulder Instructions      Home Living Family/patient expects to be discharged to:: Private residence Living Arrangements: Children (daughter) Available Help at Discharge: Family Type of Home: House Home Access: Stairs to enter Technical brewer of Steps: 3 Entrance Stairs-Rails: None Home Layout: One level     Bathroom  Shower/Tub: Tub/shower unit                    Prior Functioning/Environment Level of Independence: Independent        Comments: has not driven in ~4 years        OT Problem List: Decreased strength;Decreased range of motion;Decreased activity tolerance;Impaired balance (sitting and/or standing);Impaired vision/perception;Decreased coordination;Decreased cognition;Decreased safety awareness;Decreased knowledge of use of DME or AE;Impaired sensation;Impaired UE functional use;Pain      OT Treatment/Interventions: Self-care/ADL training;Therapeutic exercise;Energy conservation;DME and/or AE instruction;Therapeutic activities;Visual/perceptual remediation/compensation;Patient/family education;Balance training    OT Goals(Current goals can be found in the care plan section) Acute Rehab OT Goals Patient Stated Goal: less pain OT Goal Formulation: With patient Time For Goal Achievement: 08/23/21 Potential to Achieve Goals: Good ADL Goals Additional ADL Goal #1: Patient will complete ADLs with I in prep for safe return home. Additional ADL Goal #2: Patient will follow 2-3 step verbal commands with 95% accuracy in prep for ADLs. Additional ADL Goal #3: Patient will return demo RUE HEP with use of written handout without cueing in prep for safe d/c hom.  OT Frequency: Min 2X/week   Barriers to D/C:            Co-evaluation              AM-PAC OT "6 Clicks" Daily Activity     Outcome Measure Help from another person eating meals?: A Little Help from  another person taking care of personal grooming?: A Little Help from another person toileting, which includes using toliet, bedpan, or urinal?: A Little Help from another person bathing (including washing, rinsing, drying)?: A Little Help from another person to put on and taking off regular upper body clothing?: A Little Help from another person to put on and taking off regular lower body clothing?: A Little 6 Click Score:  18   End of Session Nurse Communication: Mobility status  Activity Tolerance: Patient tolerated treatment well;Patient limited by pain Patient left: in bed;with call bell/phone within reach;with bed alarm set  OT Visit Diagnosis: Unsteadiness on feet (R26.81);Other abnormalities of gait and mobility (R26.89);Muscle weakness (generalized) (M62.81);Ataxia, unspecified (R27.0);Other symptoms and signs involving cognitive function;Dizziness and giddiness (R42);Hemiplegia and hemiparesis Hemiplegia - Right/Left: Right Hemiplegia - dominant/non-dominant: Dominant Hemiplegia - caused by: Unspecified                Time: 5929-2446 OT Time Calculation (min): 20 min Charges:  OT General Charges $OT Visit: 1 Visit OT Evaluation $OT Eval Low Complexity: 1 Low  Nile Prisk H. OTR/L Supplemental OT, Department of rehab services [phone]  Edman Lipsey R H. 08/09/2021, 10:47 AM

## 2021-08-09 NOTE — Progress Notes (Signed)
Neurology Progress Note   S:// Recalled to see the patient-right arm pain and weakness, confusion and right leg weakness.    O:// Current vital signs: BP 128/75 (BP Location: Right Arm)   Pulse 84   Temp 97.7 F (36.5 C) (Oral)   Resp 18   Ht 5' 9.02" (1.753 m)   Wt 93.9 kg   LMP 04/03/2018 (Within Days)   SpO2 98%   BMI 30.56 kg/m  Vital signs in last 24 hours: Temp:  [97.7 F (36.5 C)-98.7 F (37.1 C)] 97.7 F (36.5 C) (09/25 0756) Pulse Rate:  [69-92] 84 (09/25 0756) Resp:  [14-19] 18 (09/25 0756) BP: (109-148)/(72-93) 128/75 (09/25 0756) SpO2:  [98 %-100 %] 98 % (09/25 0756) Weight:  [93.9 kg] 93.9 kg (09/24 1617) General examination: Awake alert in no distress HEENT: Normocephalic/atraumatic CVs: Regular rhythm Respiratory: Breathing well saturating normally on room air Abdomen nondistended nontender Neurological exam Awake alert oriented x3 No dysarthria No evidence of aphasia Cranial nerves II to XII intact Motor exam with effort dependent right upper extremity weakness at least 4/5-also limited by pain.  Right lower extremity is nearly 5/5 again with some effort dependent weakness and pain.  Left upper and lower extremity are full strength Sensation: Subjectively tingly on the right side but not really diminished sensation. Coordination: Intact  Medications  Current Facility-Administered Medications:    0.9 %  sodium chloride infusion, 75 mL/hr, Intravenous, Continuous, Wynetta Fines T, MD, Last Rate: 75 mL/hr at 08/08/21 1942, 75 mL/hr at 08/08/21 1942   acetaminophen (TYLENOL) tablet 650 mg, 650 mg, Oral, Q6H PRN, Antonieta Pert, MD, 650 mg at 08/09/21 0651   chlorhexidine gluconate (MEDLINE KIT) (PERIDEX) 0.12 % solution 15 mL, 15 mL, Mouth Rinse, BID, Wynetta Fines T, MD, 15 mL at 08/09/21 0834   enoxaparin (LOVENOX) injection 40 mg, 40 mg, Subcutaneous, Q24H, Wynetta Fines T, MD, 40 mg at 08/08/21 2048   feeding supplement (ENSURE ENLIVE / ENSURE PLUS) liquid  237 mL, 237 mL, Oral, BID BM, Kc, Ramesh, MD   [START ON 08/10/2021] ibuprofen (ADVIL) tablet 400 mg, 400 mg, Oral, Q6H PRN, Kc, Ramesh, MD   LORazepam (ATIVAN) injection 4 mg, 4 mg, Intravenous, Q5 Min x 2 PRN, Wynetta Fines T, MD   methocarbamol (ROBAXIN) tablet 750 mg, 750 mg, Oral, Q6H PRN, Kc, Ramesh, MD   polyvinyl alcohol (LIQUIFILM TEARS) 1.4 % ophthalmic solution 1 drop, 1 drop, Both Eyes, PRN, Kc, Ramesh, MD Labs CBC    Component Value Date/Time   WBC 3.3 (L) 08/08/2021 0359   RBC 3.67 (L) 08/08/2021 0359   HGB 10.9 (L) 08/08/2021 0359   HGB 12.2 04/15/2021 0834   HCT 33.1 (L) 08/08/2021 0359   PLT 225 08/08/2021 0359   PLT 276 04/15/2021 0834   MCV 90.2 08/08/2021 0359   MCH 29.7 08/08/2021 0359   MCHC 32.9 08/08/2021 0359   RDW 12.5 08/08/2021 0359   LYMPHSABS 0.9 08/07/2021 1913   MONOABS 0.4 08/07/2021 1913   EOSABS 0.1 08/07/2021 1913   BASOSABS 0.0 08/07/2021 1913    CMP     Component Value Date/Time   NA 143 08/09/2021 0217   NA 143 06/30/2020 1553   K 3.5 08/09/2021 0217   CL 112 (H) 08/09/2021 0217   CO2 23 08/09/2021 0217   GLUCOSE 85 08/09/2021 0217   BUN 7 08/09/2021 0217   BUN 8 06/30/2020 1553   CREATININE 0.84 08/09/2021 0217   CREATININE 0.95 04/15/2021 0834   CALCIUM 8.6 (L)  08/09/2021 0217   PROT 6.2 (L) 08/07/2021 1913   ALBUMIN 3.3 (L) 08/07/2021 1913   AST 28 08/07/2021 1913   AST 19 04/15/2021 0834   ALT 18 08/07/2021 1913   ALT 14 04/15/2021 0834   ALKPHOS 53 08/07/2021 1913   BILITOT 1.8 (H) 08/07/2021 1913   BILITOT 1.0 04/15/2021 0834   GFRNONAA >60 08/09/2021 0217   GFRNONAA >60 04/15/2021 0834   GFRAA 82 06/30/2020 1553    glycosylated hemoglobin  Lipid Panel     Component Value Date/Time   CHOL 213 (H) 06/30/2020 1553   TRIG 93 06/30/2020 1553   HDL 55 06/30/2020 1553   CHOLHDL 3.9 06/30/2020 1553   CHOLHDL 3.4 05/31/2014 0433   VLDL 13 05/31/2014 0433   LDLCALC 141 (H) 06/30/2020 1553   Overnight EEG yesterday:  Normal with some benzodiazepine effect.  Imaging I have reviewed images in epic and the results pertinent to this consultation are: MR brain-no acute changes MRA head and neck-no evidence of dissection or aneurysm or LVO.  Assessment: 50 year old with multiple seizures post colonoscopy. Semiology not very consistent with epileptic event. EEG negative MRI brain and MRA head and neck negative. Continues to complain of some right-sided headache and right neck pain along with effort dependent right arm and leg weakness. I would recommend looking at her shoulder to make sure she did not injure it while she was having those violent epileptic looking spells. Unclear cause of seizure like activity   Recommendations: Shoulder x-ray - right shoulder due to extreme pain on palpation No need for antibiotics Symptomatic management per primary team Plan discussed with the patient, daughter and the primary hospitalist Please call neurology as needed    -- Amie Portland, MD Neurologist [facility] Pager: [phone]

## 2021-08-09 NOTE — Progress Notes (Signed)
Physical Therapy Treatment Patient Details Name: Sheila [name] MRN: [id number] DOB: 22-Feb-1971 Today's Date: 08/09/2021   History of Present Illness Pt is a 50 y.o. F who presents with multiple seizures after her colonoscopy. LTM EEG with no seizures overnight. MRI negative for acute abnormality. Significant PMH: breast CA with mets.    PT Comments    Patient reports not feeling great today, unable to sleep last night. Reports right side feels heavy and achy worse than yesterday. Also noted to be sensitive to touch, impaired sensation and incoordination as well as weakness. Also with elevated BP- Supine BP 159/96, sitting BP 161/95, standing BP 177/117, with dizziness, "feels like the room is spinning." Reports blurry vision in right eye. Pt also noted to have some cognitive deficits related to memory, slow processing and recalling date/president on first attempt. Requires Min A for standing and taking a few steps but limited due to dizziness. Spouse present and pt reports walking to bathroom with him early very guarded with short steps. Recommend use of RW at home and assist with OOB until symptoms improve as well as outpatient neuro PT. Will follow acutely.  Recommendations for follow up therapy are one component of a multi-disciplinary discharge planning process, led by the attending physician.  Recommendations may be updated based on patient status, additional functional criteria and insurance authorization.  Follow Up Recommendations  Outpatient PT;Supervision for mobility/OOB     Equipment Recommendations  Rolling walker with 5" wheels    Recommendations for Other Services       Precautions / Restrictions Precautions Precautions: Fall;Other (comment) Precaution Comments: R eye diplopia, seizure, high bp Restrictions Weight Bearing Restrictions: No     Mobility  Bed Mobility Overal bed mobility: Modified Independent             General bed mobility comments: Increased  time and use of rails to get to EOB. No dizziness.    Transfers Overall transfer level: Needs assistance Equipment used: 1 person hand held assist Transfers: Sit to/from Stand Sit to Stand: Min assist         General transfer comment: Min A to rise into standing from EOB x2, + dizziness once upright, No nystagmus noted but pt grabbing therapist to stabilize.  Ambulation/Gait Ambulation/Gait assistance: Min guard Gait Distance (Feet): 5 Feet Assistive device: None   Gait velocity: decreased   General Gait Details: Able to side step along side bed with close min guard, limited by pain/dizziness. Had just walked to bathroom with spouse prior to arrival.   Chief Strategy Officer    Modified Rankin (Stroke Patients Only)       Balance Overall balance assessment: Needs assistance Sitting-balance support: Feet supported;No upper extremity supported Sitting balance-Leahy Scale: Good     Standing balance support: During functional activity Standing balance-Leahy Scale: Poor Standing balance comment: Requires external support in standing due to dizziness.                            Cognition Arousal/Alertness: Awake/alert Behavior During Therapy: WFL for tasks assessed/performed Overall Cognitive Status: Impaired/Different from baseline Area of Impairment: Memory;Problem solving                     Memory: Decreased short-term memory       Problem Solving: Slow processing;Requires verbal cues General Comments: Slow processing, "I don't even know what happened  to me or why I am here." Answers incorrectly for year, and president initially but able to correct self.      Exercises      General Comments General comments (skin integrity, edema, etc.): Reports right side feels heavy and achy today worse than yesterday, sensitive to touch and weak as well. Not able to hold a cup, impaired coordination noted. Also with elevated BP.  Supine BP159/96, sitting BP 161/95, standing BP 177/117, with dizziness, "feels like the room is spinning." Reports blurry vision in right eye.      Pertinent Vitals/Pain Pain Assessment: Faces Faces Pain Scale: Hurts whole lot Pain Location: RUE, head Pain Descriptors / Indicators: Aching;Constant;Discomfort;Grimacing Pain Intervention(s): Monitored during session;Limited activity within patient's tolerance;Premedicated before session;Repositioned    Home Living                      Prior Function            PT Goals (current goals can now be found in the care plan section) Progress towards PT goals: Not progressing toward goals - comment (due to dizziness, worsening pain in right side)    Frequency    Min 3X/week      PT Plan Current plan remains appropriate    Co-evaluation              AM-PAC PT "6 Clicks" Mobility   Outcome Measure  Help needed turning from your back to your side while in a flat bed without using bedrails?: A Little Help needed moving from lying on your back to sitting on the side of a flat bed without using bedrails?: A Little Help needed moving to and from a bed to a chair (including a wheelchair)?: A Little Help needed standing up from a chair using your arms (e.g., wheelchair or bedside chair)?: A Little Help needed to walk in hospital room?: A Little Help needed climbing 3-5 steps with a railing? : A Lot 6 Click Score: 17    End of Session Equipment Utilized During Treatment: Gait belt Activity Tolerance: Treatment limited secondary to medical complications (Comment) (limited by dizziness, pain.) Patient left: in bed;with call bell/phone within reach;Other (comment) (with OT present) Nurse Communication: Mobility status PT Visit Diagnosis: Unsteadiness on feet (R26.81);Difficulty in walking, not elsewhere classified (R26.2);Pain Pain - Right/Left: Right Pain - part of body: Shoulder;Arm;Hand     Time: 4492-0100 PT Time  Calculation (min) (ACUTE ONLY): 23 min  Charges:  $Therapeutic Activity: 23-37 mins                     Marisa Severin, PT, DPT Acute Rehabilitation Services Pager [phone] Office [phone]      Marguarite Arbour A Lakeshia Dohner 08/09/2021, 10:08 AM

## 2021-08-09 NOTE — TOC Transition Note (Signed)
Transition of Care [facility]) - CM/SW Discharge Note   Patient Details  Name: Sheila [name] MRN: [id number] Date of Birth: 1971-10-04  Transition of Care [facility]) CM/SW Contact:  Carles Collet, RN Phone Number: 08/09/2021, 10:40 AM   Clinical Narrative:   Damaris Schooner w patient's daughter. She states that the patient is active for outpatient therapy at [facility]; no new referral needed. Confirms need for DME RW. Order placed and [employer] to bring to room today.           Patient Goals and CMS Choice        Discharge Placement                       Discharge Plan and Services                                     Social Determinants of Health (SDOH) Interventions     Readmission Risk Interventions No flowsheet data found.

## 2021-08-09 NOTE — Plan of Care (Addendum)
Clarification sought on continuation of Keppra as outpatient.  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  No need to continue Keppra at this time. If she continues to have recurrent seizures, will consider adding Keppra for longstanding use. She would need to follow seizure precautions per state law though due to seizure-like activity.  Detailed seizure precautions below. Plan discussed with the primary hospitalist was secure chat  -- Amie Portland, MD Neurologist [facility] Pager: [phone]   [place] Per [facility] statutes, patients with seizures are not allowed to drive until they have been seizure-free for six months.   Use caution when using heavy equipment or power tools. Avoid working on ladders or at heights. Take showers instead of baths. Ensure the water temperature is not too high on the home water heater. Do not go swimming alone. Do not lock yourself in a room alone (i.e. bathroom). When caring for infants or small children, sit down when holding, feeding, or changing them to minimize risk of injury to the child in the event you have a seizure. Maintain good sleep hygiene. Avoid alcohol.    If patient has another seizure, call 911 and bring them back to the ED if: A.  The seizure lasts longer than 5 minutes.      B.  The patient doesn't wake shortly after the seizure or has new problems such as difficulty seeing, speaking or moving following the seizure C.  The patient was injured during the seizure D.  The patient has a temperature over 102 F (39C) E.  The patient vomited during the seizure and now is having trouble breathing

## 2021-08-09 NOTE — Progress Notes (Signed)
PROGRESS NOTE    Sheila [name]  AGT:[id number] DOB: 07-15-1971 DOA: 08/07/2021 PCP: Griffin Basil, MD   Chief Complaint  Patient presents with   Seizures   Brief Narrative/Hospital Course: Brief/Interim Summary: 50 YOF with history of breast cancer stage Ia with lumpectomy and radiation therapy, nonobstructive CAD, family history of seizure presented with new onset seizure following colonoscopy. She had a elective colonoscopy  9/23  afternoon for anemia screening, under general anesthesia with propofol.  Right after the procedure, in the recovery room patient had 2 episodes of generalized seizure with witnessed whole body shaking and unresponsive.  Was given Versed 2 times and shifted to ED.  The patient had another 2-3 episodes of seizures in the ED and received a total of 9 mg IV Versed. Initially she was unresponsive/set in the ED, underwent further work-up CT head no acute finding given Keppra loading dose and placed on continuous EEG and admitted Had MRI brain negative for mets or stroke. Was complaining of rt arm neck pain and heaviness-MRAneck and no LVO or dissection  Subjective:  Seen this morning.  Patient complains of heaviness pain on right neck right shoulder right hip-it worsens with movement,?  Weak grip from pain. No seizure-like activity. Some dizziness  Assessment & Plan:  Seizure-like activityfollowing colonoscopy anesthesia during recovery.  CT head no acute finding.  EEG no status noted.   s/p Keppra here underwent LTM EEG with no seizure overnight,MRI brain with and without, MRA of head and neck negative for dissections or significant occlusion.  Per neurology etiology for her presentation remains elusive adverse reaction to sedation versus lowering of seizure threshold in the setting of acute procedure causing seizures versus psychogenic seizure in the differentials.  Off Keppra.  Keep on seizure precaution fall precaution, cannot drive for 6 months until  cleared by neurology. UDS positive for benzo ( 2/2 versed).   Right-sided heaviness/pain: MRA neck no dissection. Less likely neurological seen by neurology.  Getting shoulder x-ray, added Toradol IV and muscle relaxant. Check orthostatic due to dizziness. Symptomatic maangement  AMS/acute metabolic encephalopathy due to seizure as well as Versed use.resolved   Breast cancer stage Ia ER positive with recent lumpectomy and radiation: Outpatient oncology follow-up to start tamoxifen   Lactic acidosis likely from seizure.  Resolved Hypokalemia repleted Mild leukopenia monitor  Obesity Class I Patient's Body mass index is 30.57 kg/m. : Will benefit with PCP follow-up, weight loss healthy lifestyle and outpatient sleep evaluation   Debility/deconditioning/dizziness continue PT OT advised outpatient PT. FEN: Diet Order             Diet - low sodium heart healthy           Diet Heart Room service appropriate? Yes; Fluid consistency: Thin  Diet effective now                  DVT prophylaxis: enoxaparin (LOVENOX) injection 40 mg Start: 08/07/21 1900 Code Status:   Code Status: Full Code  Family Communication: plan of care discussed with patient and daughter at bedside. Status is: Inpatient Remains hospitalized for ongoing management of her pain right-sided discomfort   Dispo: The patient is from: Home              Anticipated d/c is to: Home              Patient currently is not medically stable to d/c.   Difficult to place patient No  Objective: Vitals: Today's Vitals  08/08/21 2300 08/09/21 0420 08/09/21 0652 08/09/21 0756  BP: 109/82 (!) 147/72  128/75  Pulse: 92 75  84  Resp: _0 Temp: 98.4 F (36.9 C) 98.6 F (37 C)  97.7 F (36.5 C)  TempSrc: Oral Axillary  Oral  SpO2: 100% 98%  98%  Weight:      Height:      PainSc:   10-Worst pain ever 10-Worst pain ever   Examination:  General exam: AAOx 3,obese, weak appearing. HEENT:Oral mucosa moist, Ear/Nose  WNL grossly, dentition normal. Respiratory system: bilaterally diminished, , no use of accessory muscle Cardiovascular system: S1 & S2 +, No JVD,. Gastrointestinal system: Abdomen soft, NT,ND, BS+ Nervous System:Alert, awake, moving bilateral extremities nonfocal, weakness heaviness painful movement of right arm /shoulder  Extremities: no edema, distal peripheral pulses palpable.  Skin: No rashes,no icterus. MSK: Normal muscle bulk,tone, power    Intake/Output Summary (Last 24 hours) at 08/09/2021 1130 Last data filed at 08/09/2021 0600 Gross per 24 hour  Intake 775 ml  Output --  Net 775 ml   Filed Weights   08/07/21 1555 08/08/21 1617  Weight: 93.9 kg 93.9 kg   Weight change: 0 kg   Consultants:see note  Procedures:see note Antimicrobials: Anti-infectives (From admission, onward)    None      Culture/Microbiology none Medications reviewed: Scheduled Meds:  chlorhexidine gluconate (MEDLINE KIT)  15 mL Mouth Rinse BID   enoxaparin (LOVENOX) injection  40 mg Subcutaneous Q24H   feeding supplement  237 mL Oral BID BM   Continuous Infusions:  sodium chloride 75 mL/hr (08/08/21 1942)     Intake/Output from previous day: 09/24 0701 - 09/25 0700 In: 3559 [I.V.:1675; IV Piggyback:100] Out: -  Intake/Output this shift: No intake/output data recorded. Filed Weights   08/07/21 1555 08/08/21 1617  Weight: 93.9 kg 93.9 kg   Data Reviewed: I have personally reviewed following labs and imaging studies CBC: Recent Labs  Lab 08/07/21 1913 08/07/21 1914 08/08/21 0359  WBC 3.8*  --  3.3*  NEUTROABS 2.4  --   --   HGB 11.6* 11.9* 10.9*  HCT 35.9* 35.0* 33.1*  MCV 91.1  --  90.2  PLT 179  --  741   Basic Metabolic Panel: Recent Labs  Lab 08/07/21 1913 08/07/21 1914 08/08/21 0359 08/09/21 0217  NA 141 144 141 143  K 3.9 3.9 3.4* 3.5  CL 111  --  112* 112*  CO2 22  --  22 23  GLUCOSE 83  --  90 85  BUN 6  --  <5* 7  CREATININE 0.82  --  0.77 0.84  CALCIUM  8.4*  --  8.3* 8.6*   GFR: Estimated Creatinine Clearance: 95.6 mL/min (by C-G formula based on SCr of 0.84 mg/dL). Liver Function Tests: Recent Labs  Lab 08/07/21 1913  AST 28  ALT 18  ALKPHOS 53  BILITOT 1.8*  PROT 6.2*  ALBUMIN 3.3*   No results for input(s): LIPASE, AMYLASE in the last 168 hours. Recent Labs  Lab 08/07/21 1930  AMMONIA 33   Coagulation Profile: No results for input(s): INR, PROTIME in the last 168 hours. Cardiac Enzymes: No results for input(s): CKTOTAL, CKMB, CKMBINDEX, TROPONINI in the last 168 hours. BNP (last 3 results) No results for input(s): PROBNP in the last 8760 hours. HbA1C: No results for input(s): HGBA1C in the last 72 hours. CBG: Recent Labs  Lab 08/07/21 1806  GLUCAP 74   Lipid Profile: No results for input(s): CHOL, HDL,  LDLCALC, TRIG, CHOLHDL, LDLDIRECT in the last 72 hours. Thyroid Function Tests: No results for input(s): TSH, T4TOTAL, FREET4, T3FREE, THYROIDAB in the last 72 hours. Anemia Panel: No results for input(s): VITAMINB12, FOLATE, FERRITIN, TIBC, IRON, RETICCTPCT in the last 72 hours. Sepsis Labs: Recent Labs  Lab 08/07/21 1931 08/08/21 1446  LATICACIDVEN 2.2* 1.2    Recent Results (from the past 240 hour(s))  Resp Panel by RT-PCR (Flu A&B, Covid) Nasopharyngeal Swab     Status: None   Collection Time: 08/07/21  4:00 PM   Specimen: Nasopharyngeal Swab; Nasopharyngeal(NP) swabs in vial transport medium  Result Value Ref Range Status   SARS Coronavirus 2 by RT PCR NEGATIVE NEGATIVE Final    Comment: (NOTE) SARS-CoV-2 target nucleic acids are NOT DETECTED.  The SARS-CoV-2 RNA is generally detectable in upper respiratory specimens during the acute phase of infection. The lowest concentration of SARS-CoV-2 viral copies this assay can detect is 138 copies/mL. A negative result does not preclude SARS-Cov-2 infection and should not be used as the sole basis for treatment or other patient management decisions. A  negative result may occur with  improper specimen collection/handling, submission of specimen other than nasopharyngeal swab, presence of viral mutation(s) within the areas targeted by this assay, and inadequate number of viral copies(<138 copies/mL). A negative result must be combined with clinical observations, patient history, and epidemiological information. The expected result is Negative.  Fact Sheet for Patients:  EntrepreneurPulse.com.au  Fact Sheet for Healthcare Providers:  IncredibleEmployment.be  This test is no t yet approved or cleared by the [place] FDA and  has been authorized for detection and/or diagnosis of SARS-CoV-2 by FDA under an Emergency Use Authorization (EUA). This EUA will remain  in effect (meaning this test can be used) for the duration of the COVID-19 declaration under Section 564(b)(1) of the Act, 21 U.S.C.section 360bbb-3(b)(1), unless the authorization is terminated  or revoked sooner.       Influenza A by PCR NEGATIVE NEGATIVE Final   Influenza B by PCR NEGATIVE NEGATIVE Final    Comment: (NOTE) The Xpert Xpress SARS-CoV-2/FLU/RSV plus assay is intended as an aid in the diagnosis of influenza from Nasopharyngeal swab specimens and should not be used as a sole basis for treatment. Nasal washings and aspirates are unacceptable for Xpert Xpress SARS-CoV-2/FLU/RSV testing.  Fact Sheet for Patients: EntrepreneurPulse.com.au  Fact Sheet for Healthcare Providers: IncredibleEmployment.be  This test is not yet approved or cleared by the [place] FDA and has been authorized for detection and/or diagnosis of SARS-CoV-2 by FDA under an Emergency Use Authorization (EUA). This EUA will remain in effect (meaning this test can be used) for the duration of the COVID-19 declaration under Section 564(b)(1) of the Act, 21 U.S.C. section 360bbb-3(b)(1), unless the authorization  is terminated or revoked.  Performed at [facility], [place] [address] [place], [place] [address]      Radiology Studies: CT HEAD WO CONTRAST  Result Date: 08/07/2021 CLINICAL DATA:  Seizure. EXAM: CT HEAD WITHOUT CONTRAST TECHNIQUE: Contiguous axial images were obtained from the base of the skull through the vertex without intravenous contrast. COMPARISON:  September 15, 2017 FINDINGS: Brain: No evidence of acute infarction, hemorrhage, hydrocephalus, extra-axial collection or mass lesion/mass effect. Vascular: No hyperdense vessel or unexpected calcification. Skull: Normal. Negative for fracture or focal lesion. Sinuses/Orbits: No acute finding. Other: None. IMPRESSION: No acute intracranial pathology. Electronically Signed   By: Virgina Norfolk M.D.   On: 08/07/2021 17:07   MR ANGIO HEAD  WO CONTRAST  Result Date: 08/08/2021 CLINICAL DATA:  Neuro deficit, acute, stroke suspected EXAM: MRA HEAD WITHOUT CONTRAST TECHNIQUE: Angiographic images of the Circle of Willis were acquired using MRA technique without intravenous contrast. COMPARISON:  Concurrently performed noncontrast brain MRI. FINDINGS: Anterior circulation: The intracranial internal carotid arteries are patent. The M1 middle cerebral arteries are patent. No M2 proximal branch occlusion or high-grade proximal stenosis is identified. The anterior cerebral arteries are patent. No intracranial aneurysm is identified. Posterior circulation: The non dominant right vertebral artery is developmentally diminutive intracranially, but patent. The dominant intracranial left vertebral artery is patent without stenosis. The basilar artery is patent. The posterior cerebral arteries are patent. A right posterior communicating artery is present. The left posterior communicating artery is hypoplastic or absent. Anatomic variants: As described. Other: None. IMPRESSION: No intracranial large vessel occlusion or proximal high-grade arterial stenosis.  Electronically Signed   By: Kellie Simmering D.O.   On: 08/08/2021 13:50   MR ANGIO NECK W WO CONTRAST  Result Date: 08/08/2021 CLINICAL DATA:  Neuro deficit, acute EXAM: MRA NECK WITHOUT AND WITH CONTRAST TECHNIQUE: Multiplanar and multiecho pulse sequences of the neck were obtained without and with intravenous contrast. Angiographic images of the neck were obtained using MRA technique without and with intravenous contrast. CONTRAST:  57m GADAVIST GADOBUTROL 1 MMOL/ML IV SOLN COMPARISON:  No pertinent prior exams available for comparison. FINDINGS: The common carotid and internal carotid arteries are patent within the neck without stenosis. The vertebral arteries are patent within the neck without appreciable stenosis. The left vertebral artery is dominant standard aortic branching. IMPRESSION: The common carotid, internal carotid and vertebral arteries are patent within the neck without stenosis. Electronically Signed   By: KKellie SimmeringD.O.   On: 08/08/2021 13:47   MR BRAIN W WO CONTRAST  Result Date: 08/08/2021 CLINICAL DATA:  Seizure, abnormal neuro exam. History of breast cancer. Rule out metastases. EXAM: MRI HEAD WITHOUT AND WITH CONTRAST TECHNIQUE: Multiplanar, multiecho pulse sequences of the brain and surrounding structures were obtained without and with intravenous contrast. CONTRAST:  959mGADAVIST GADOBUTROL 1 MMOL/ML IV SOLN COMPARISON:  Head CT 08/08/2019 FINDINGS: Brain: Cerebral volume is normal for age. Minimal multifocal T2/FLAIR hyperintense signal abnormality within the cerebral white matter, nonspecific but most often secondary to chronic small vessel ischemia. Chronic lacunar infarct within the right pons (series 14, image 9). There is no acute infarct. No evidence of an intracranial mass. No chronic intracranial blood products. No extra-axial fluid collection. No midline shift. No pathologic intracranial enhancement identified. Vascular: Maintained flow voids within the proximal large  arterial vessels. Non dominant right vertebral artery. Skull and upper cervical spine: No focal suspicious marrow lesion. Sinuses/Orbits: Visualized orbits show no acute finding. No significant paranasal sinus disease. Other: Trace fluid within the bilateral mastoid air cells. IMPRESSION: No evidence of acute intracranial abnormality. No evidence of intracranial metastatic disease. Minimal multifocal T2 FLAIR hyperintense signal abnormality within the cerebral white matter, nonspecific but most often secondary to chronic small vessel ischemia. Trace fluid within the bilateral mastoid air cells. Electronically Signed   By: KyKellie Simmering.O.   On: 08/08/2021 13:43   EEG adult  Result Date: 08/07/2021 KaWillaim RayasMD     08/07/2021  9:12 PM [facility] Services Stat EEG Report 20 Minute Date of Study:   08/07/2021 20:53:00 Indication: Spells, Eval for Seizures, Technical Summary: A routine 20 channel electroencephalogram using the international 10-20 system of electrode placement was performed. Background: 9-10  Hz, Posterior dominant rhythm that attenuated with eye Opening States      Awake      Drowsy: were seen during drowsiness      Asleep: were seen during asleep Activation Procedures Hyperventilation: Not performed Photic Stimulation: Not performed Classification: Normal : There were no Epileptiform discharges Diagnosis: Normal Awake study. There are no epileptiform discharges. Clinical Correlation: This is a normal study. The absence of interictal epileptiform abnormalities does not exclude the diagnosis of a seizure disorder. Dr Apolinar Junes [facility] [phone] Case [id number]   Overnight EEG with video  Result Date: 08/08/2021 Lora Havens, MD     08/08/2021  8:48 AM Patient Name: Sheila [name] MRN: [id number] Epilepsy Attending: Lora Havens Referring Physician/Provider: Dr Amie [place] Duration: 08/07/2021 2109 to 08/08/2021 0845 Patient  history: 50 year old with history of breast cancer with sudden onset of generalized tonic-clonic seizure-like activity while in recovery after getting a colonoscopy. EEG to evaluate for seizure. Level of alertness: Awake, asleep AEDs during EEG study: None Technical aspects: This EEG study was done with scalp electrodes positioned according to the 10-20 International system of electrode placement. Electrical activity was acquired at a sampling rate of _0  and reviewed with a high frequency filter of _1  and a low frequency filter of _2 . EEG data were recorded continuously and digitally stored. Description: The posterior dominant rhythm consists of 15 Hz activity of beta activity seen predominantly in posterior head regions, symmetric and reactive to eye opening and eye closing. Sleep was characterized by vertex waves, sleep spindles (12 to 14 Hz), maximal frontocentral region.  Hyperventilation and photic stimulation were not performed.   IMPRESSION: This study is within normal limits. The beta activity could be  seen due to benzodiapine use. No seizures or epileptiform discharges were seen throughout the recording. Lora Havens     LOS: 1 day   Antonieta Pert, MD [facility]  08/09/2021, 11:30 AM

## 2021-08-10 ENCOUNTER — Ambulatory Visit
Admission: RE | Admit: 2021-08-10 | Discharge: 2021-08-10 | Disposition: A | Discharge status: Home / Self Care | Payer: Medicaid Other | Source: Ambulatory Visit | Attending: Radiation Oncology | Admitting: Radiation Oncology

## 2021-08-10 ENCOUNTER — Ambulatory Visit: Payer: Medicaid Other

## 2021-08-10 DIAGNOSIS — Z170 Estrogen receptor positive status [ER+]: Secondary | ICD-10-CM

## 2021-08-10 DIAGNOSIS — C50412 Malignant neoplasm of upper-outer quadrant of left female breast: Secondary | ICD-10-CM

## 2021-08-10 DIAGNOSIS — R569 Unspecified convulsions: Secondary | ICD-10-CM | POA: Diagnosis not present

## 2021-08-10 NOTE — Progress Notes (Signed)
  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   Interval Since Last Radiation:  6 weeks   06/04/2021 through 07/03/2021 Site Technique Total Dose (Gy) Dose per Fx (Gy) Completed Fx Beam Energies  Breast, Left: Breast_Lt 3D 42.56/42.56 2.66 16/16 6X, 10X  Breast, Left: Breast_Lt_Bst 3D 8/8 2 4/4 6X, 10X    Narrative:  The patient was contacted today for routine follow-up. During treatment she did very well with radiotherapy and did not have significant desquamation. She reports she is feeling okay but is getting discharged today from the hospital. She had seizures as a result of anesthesia for a colonoscopy and is trying to regain strength in her arm and right side.  Impression/Plan: 1. 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 The patient has been doing well since completion of radiotherapy. We discussed that we would be happy to continue to follow her as needed, but she will also continue to follow up with Dr. Burr Medico in medical oncology. She was counseled on skin care as well as measures to avoid sun exposure to this area.  2. Survivorship. We discussed the importance of survivorship evaluation and encouraged her to attend her upcoming visit with that clinic. 3. Complications of anesthesia. The patient will follow up with her PCP and likely neurology given her seizure.       Carola Rhine, PAC

## 2021-08-10 NOTE — Progress Notes (Signed)
Discharged orders received and reviewed with pt and daughter. Pt allowed to ask questions. Pt mentioned that a bedside commode is needed. RN did not see order for [facility]. Social worker Gates Rigg) notified. PIV R hand removed without any complications. Temetry d/c'd. Follow up appt scheduled. Rx send to pharmacy of preference. Pt denies any pain at this time. AVS reviewed with pt. Pending BSC to be delivered to pt so she can be discharged.

## 2021-08-10 NOTE — Progress Notes (Signed)
Physical Therapy Treatment Patient Details Name: Sheila [name] MRN: [id number] DOB: 27-Jul-1971 Today's Date: 08/10/2021   History of Present Illness 50 y.o. female admitted 9/23 with multiple seizures after her colonoscopy. LTM EEG with no seizures overnight. MRI negative for acute abnormality. Significant PMH: breast CA with mets.    PT Comments    Pt pleasant and eager to return home. Pt with maintained bil hand and feet numbness with report or RUE heaviness and pain with shoulder movement 6/10 and 3/10 at rest. Pt states right thigh 2/10 with aching feeling. Pt demonstrates 3/5 bil UE strength not wanting to resist due to pain and 3+/5 RLE strength with 4/5 LLE strength. Pt educated for stairs, RW use and progressive HEP with pt verbalizing understanding and very eager for D/C. Plan remains appropriate.     Recommendations for follow up therapy are one component of a multi-disciplinary discharge planning process, led by the attending physician.  Recommendations may be updated based on patient status, additional functional criteria and insurance authorization.  Follow Up Recommendations  Outpatient PT;Supervision for mobility/OOB     Equipment Recommendations  Rolling walker with 5" wheels    Recommendations for Other Services       Precautions / Restrictions Precautions Precautions: Fall;Other (comment) Precaution Comments: seizure Restrictions Weight Bearing Restrictions: No     Mobility  Bed Mobility Overal bed mobility: Modified Independent                  Transfers Overall transfer level: Modified independent                  Ambulation/Gait Ambulation/Gait assistance: Modified independent (Device/Increase time) Gait Distance (Feet): 300 Feet Assistive device: Rolling walker (2 wheeled) Gait Pattern/deviations: Step-through pattern;Decreased stride length   Gait velocity interpretation: >2.62 ft/sec, indicative of community ambulatory General  Gait Details: pt with report of maintained right weakness and numbness in hands and feet. pt with steady gait with RW and cautious with transitioning to and from AD   Stairs Stairs: Yes Stairs assistance: Min assist Stair Management: Step to pattern;Forwards Number of Stairs: 3 General stair comments: 3 steps with step to pattern, HHA and cues for sequence with mild instability and anxiousness needing HHA to complete transitions   Wheelchair Mobility    Modified Rankin (Stroke Patients Only)       Balance Overall balance assessment: Mild deficits observed, not formally tested                                          Cognition Arousal/Alertness: Awake/alert Behavior During Therapy: WFL for tasks assessed/performed Overall Cognitive Status: Within Functional Limits for tasks assessed                                        Exercises General Exercises - Lower Extremity Long Arc Quad: AROM;Right;Seated;10 reps Hip Flexion/Marching: AROM;Right;Seated;10 reps    General Comments        Pertinent Vitals/Pain Pain Score: 6  Pain Location: right shoulder Pain Descriptors / Indicators: Aching;Constant;Grimacing;Guarding Pain Intervention(s): Limited activity within patient's tolerance;Monitored during session;Repositioned    Home Living                      Prior Function  PT Goals (current goals can now be found in the care plan section) Progress towards PT goals: Progressing toward goals    Frequency    Min 3X/week      PT Plan Current plan remains appropriate    Co-evaluation              AM-PAC PT "6 Clicks" Mobility   Outcome Measure  Help needed turning from your back to your side while in a flat bed without using bedrails?: None Help needed moving from lying on your back to sitting on the side of a flat bed without using bedrails?: None Help needed moving to and from a bed to a chair  (including a wheelchair)?: None Help needed standing up from a chair using your arms (e.g., wheelchair or bedside chair)?: A Little Help needed to walk in hospital room?: A Little Help needed climbing 3-5 steps with a railing? : A Little 6 Click Score: 21    End of Session Equipment Utilized During Treatment: Gait belt Activity Tolerance: Patient tolerated treatment well Patient left: in chair;with call bell/phone within reach Nurse Communication: Mobility status PT Visit Diagnosis: Other abnormalities of gait and mobility (R26.89);Muscle weakness (generalized) (M62.81)     Time: 1117-3567 PT Time Calculation (min) (ACUTE ONLY): 20 min  Charges:  $Gait Training: 8-22 mins                     Bayard Males, PT Acute Rehabilitation Services Pager: [phone] Office: [place] 08/10/2021, 9:07 AM

## 2021-08-10 NOTE — TOC Transition Note (Addendum)
Transition of Care [facility]) - CM/SW Discharge Note   Patient Details  Name: Sheila [name] MRN: [id number] Date of Birth: July 13, 1971  Transition of [facility]) CM/SW Contact:  Joanne Chars, LCSW Phone Number: 08/10/2021, 9:42 AM   Clinical Narrative:   Pt discharging home to self care.  Outpt PT ordered at [facility] clinic, where pt was already active.  CSW verified that rolling walker was delivered and is in the room.  Husband present to transport pt home.  No other needs identified.    1100: CSW contacted that pt requesting 3n1.  MD notified, [employer] will deliver.    Final next level of care: Home/Self Care Barriers to Discharge: No Barriers Identified   Patient Goals and CMS Choice        Discharge Placement                       Discharge Plan and Services                DME Arranged: Walker rolling DME Agency: [employer]     Representative spoke with at DME Agency: Unsure-ordered over the weekend [facility] Arranged: NA          Social Determinants of Health (SDOH) Interventions     Readmission Risk Interventions No flowsheet data found.

## 2021-08-10 NOTE — Progress Notes (Signed)
Nutrition Brief Note  Patient identified on the Malnutrition Screening Tool (MST) Report.  Admitting Dx: Seizure ([place]) [R56.9] Seizure-like activity ([place]) [R56.9] PMH:  Past Medical History:  Diagnosis Date   Anemia    Anginal pain ([place])    Breast cancer ([place])    Family history of bone cancer    Family history of breast cancer    Family history of prostate cancer    Family history of throat cancer    Malignant neoplasm of upper-outer quadrant of left breast in female, estrogen receptor positive ([place]) 04/08/2021   Medications:   chlorhexidine gluconate (MEDLINE KIT)  15 mL Mouth Rinse BID   enoxaparin (LOVENOX) injection  40 mg Subcutaneous Q24H   feeding supplement  237 mL Oral BID BM   Labs: Recent Labs  Lab 08/07/21 1913 08/07/21 1914 08/08/21 0359 08/09/21 0217  NA 141 144 141 143  K 3.9 3.9 3.4* 3.5  CL 111  --  112* 112*  CO2 22  --  22 23  BUN 6  --  <5* 7  CREATININE 0.82  --  0.77 0.84  CALCIUM 8.4*  --  8.3* 8.6*  GLUCOSE 83  --  90 85   Wt Readings from Last 15 Encounters:  08/08/21 93.9 kg  08/07/21 93.9 kg  08/06/21 95.5 kg  07/02/21 94.2 kg  06/02/21 94.8 kg  06/02/21 94.1 kg  05/28/21 94 kg  04/29/21 96.3 kg  04/24/21 96.3 kg  04/22/21 96.2 kg  04/15/21 96 kg  02/25/21 97.7 kg  12/09/20 82.6 kg  11/05/20 94.8 kg  06/30/20 94.8 kg  No significant weight changes noted.  Body mass index is 30.56 kg/m. Patient meets criteria for obesity based on current BMI.   Current diet order is heart healthy, patient is consuming approximately 100% of meals at this time.   Pt reports no issues and is eager to discharge home. Discharge orders have been signed and pt is to discharge today. No nutrition interventions warranted at this time. If nutrition issues arise, please consult RD.   Larkin Ina, MS, RD, LDN (she/her/hers) RD pager number and weekend/on-call pager number located in [place].

## 2021-08-10 NOTE — Discharge Summary (Signed)
Physician Discharge Summary  Sheila [name] JHE:[id number] DOB: [birth year] DOA: 08/07/2021  PCP: Griffin Basil, MD  Admit date: 08/07/2021 Discharge date: 08/10/2021  Admitted From: home Disposition:  home  Recommendations for Outpatient Follow-up:  Follow up with PCP in 1-2 weeks Home Health:no  Equipment/Devices: None  Discharge Condition: Stable Code Status:   Code Status: Full Code Diet recommendation:  Diet Order             Diet - low sodium heart healthy           Diet Heart Room service appropriate? Yes; Fluid consistency: Thin  Diet effective now                    Brief/Interim Summary:  50 YOF with history of breast cancer stage Ia with lumpectomy and radiation therapy, nonobstructive CAD, family history of seizure presented with new onset seizure following colonoscopy. She had a elective colonoscopy  9/23  afternoon for anemia screening, under general anesthesia with propofol.  Right after the procedure, in the recovery room patient had 2 episodes of generalized seizure with witnessed whole body shaking and unresponsive.  Was given Versed 2 times and shifted to ED.  The patient had another 2-3 episodes of seizures in the ED and received a total of 9 mg IV Versed. Initially she was unresponsive/set in the ED, underwent further work-up CT head no acute finding given Keppra loading dose and placed on continuous EEG and admitted Had MRI brain negative for mets or stroke. Was complaining of rt arm neck pain and heaviness-MRAneck and no LVO or dissection. Patient was complaining of right-sided heaviness pain similar neurology do not suspect neurological history of managed with muscle relaxant pain medication and kept overnight x-ray of shoulder unremarkable.  Patient this morning feels much improved and feels ready for home and advised outpatient follow-up with PCP to make sure it has resolved  Discharge Diagnoses:  Seizure-like activityfollowing colonoscopy anesthesia  during recovery.  CT head no acute finding.  EEG no status noted.   s/p Keppra here underwent LTM EEG with no seizure overnight,MRI brain with and without, MRA of head and neck negative for dissections or significant occlusion.  Per neurology etiology for her presentation remains elusive adverse reaction to sedation versus lowering of seizure threshold in the setting of acute procedure causing seizures versus psychogenic seizure in the differentials.  Off Keppra.  Keep on seizure precaution fall precaution, cannot drive for 6 months until cleared by neurology. UDS positive for benzo ( 2/2 versed).    Right-sided heaviness/pain: MRA neck no dissection. Less likely neurological seen by neurology.  Getting managed shoulder x-ray, with Toradol IV and muscle relaxant.  Can take Robaxin and Motrin at home and follow-up with PCP for resolution monitoring patient has been given clear instruction about not driving..  AMS/acute metabolic encephalopathy due to seizure as well as Versed use.resolved   Breast cancer stage Ia ER positive with recent lumpectomy and radiation: Outpatient oncology follow-up to start tamoxifen   Lactic acidosis likely from seizure.  Resolved Hypokalemia repleted Mild leukopenia monitor   Obesity Class I Patient's Body mass index is 30.57 kg/m. : Will benefit with PCP follow-up, weight loss healthy lifestyle and outpatient sleep evaluation   Debility/deconditioning/dizziness continue PT OT advised outpatient PT Consults: neurology  Subjective: Alert awake oriented able to move her right arm much better with still having some pain has a good grip better than yesterday.  Feels ready for home today she had  shower/ did PT and was on the bedside chair. Discharge Exam: Vitals:   08/10/21 0000 08/10/21 0553  BP: (!) 158/89 129/79  Pulse: 79 86  Resp: 15 (!) 21  Temp: 98.2 F (36.8 C) 97.8 F (36.6 C)  SpO2: 98% 99%   General: Pt is alert, awake, not in acute  distress Cardiovascular: RRR, S1/S2 +, no rubs, no gallops Respiratory: CTA bilaterally, no wheezing, no rhonchi Abdominal: Soft, NT, ND, bowel sounds + Extremities: no edema, no cyanosis  Discharge Instructions  Discharge Instructions     Ambulatory referral to Occupational Therapy   Complete by: As directed    Ambulatory referral to Physical Therapy   Complete by: As directed    Diet - low sodium heart healthy   Complete by: As directed    Discharge instructions   Complete by: As directed    Please call call MD or return to ER for similar or worsening recurring problem that brought you to hospital or if any fever,nausea/vomiting,abdominal pain, uncontrolled pain, chest pain,  shortness of breath or any other alarming symptoms.  Please follow-up your doctor as instructed in a week time and call the office for appointment.  Please avoid alcohol, smoking, or any other illicit substance and maintain healthy habits including taking your regular medications as prescribed.  You were cared for by a hospitalist during your hospital stay. If you have any questions about your discharge medications or the care you received while you were in the hospital after you are discharged, you can call the unit and ask to speak with the hospitalist on call if the hospitalist that took care of you is not available.  Once you are discharged, your primary care physician will handle any further medical issues. Please note that NO REFILLS for any discharge medications will be authorized once you are discharged, as it is imperative that you return to your primary care physician (or establish a relationship with a primary care physician if you do not have one) for your aftercare needs so that they can reassess your need for medications and monitor your lab values    Per [facility] statutes, patients with seizures or seizure like acticity, are not allowed to drive until  they have been seizure-free for six  months. Use caution when using heavy equipment or power tools. Avoid working on ladders or at heights. Take showers instead of baths. Ensure the water temperature is not too high on the home water heater. Do not go swimming alone. When caring for infants or small children, sit down when holding, feeding, or changing them to minimize risk of injury to the child in the event you have a seizure.  Maintain good sleep hygiene. Avoid alcohol.   Increase activity slowly   Complete by: As directed       Allergies as of 08/10/2021       Reactions   Anesthetics, Amide Other (See Comments)   seizures   Other Other (See Comments)   General Anesthesia caused seizures-Propofol        Medication List     TAKE these medications    feeding supplement Liqd Take 237 mLs by mouth 2 (two) times daily between meals for 7 days.   ibuprofen 800 MG tablet Commonly known as: ADVIL Take 1 tablet (800 mg total) by mouth every 8 (eight) hours as needed for moderate pain.   methocarbamol 500 MG tablet Commonly known as: Robaxin Take 1 tablet (500 mg total) by mouth every 6 (  six) hours as needed for up to 30 doses for muscle spasms.   prochlorperazine 10 MG tablet Commonly known as: COMPAZINE Take 1 tablet (10 mg total) by mouth every 6 (six) hours as needed for nausea or vomiting.   tamoxifen 20 MG tablet Commonly known as: NOLVADEX Take 1 tablet (20 mg total) by mouth daily.   triamcinolone cream 0.1 % Commonly known as: KENALOG Apply topically 2 (two) times daily.               Durable Medical Equipment  (From admission, onward)           Start     Ordered   08/09/21 1047  For home use only DME Walker rolling  Once       Question Answer Comment  Walker: With 5 Inch Wheels   Patient needs a walker to treat with the following condition Weakness      08/09/21 1047            Follow-up Information     Griffin Basil, MD Follow up in 1 week(s).   Specialty: Obstetrics and  Gynecology Contact information: [place] [address] [phone]         [facility] Follow up.   Specialty: Rehabilitation Why: This office will reach out to you to schedule your next outpatient visit. Contact information: [address] [phone] mc [place] [address] [phone]               Allergies  Allergen Reactions   Anesthetics, Amide Other (See Comments)    seizures   Other Other (See Comments)    General Anesthesia caused seizures-Propofol    The results of significant diagnostics from this hospitalization (including imaging, microbiology, ancillary and laboratory) are listed below for reference.    Microbiology: Recent Results (from the past 240 hour(s))  Resp Panel by RT-PCR (Flu A&B, Covid) Nasopharyngeal Swab     Status: None   Collection Time: 08/07/21  4:00 PM   Specimen: Nasopharyngeal Swab; Nasopharyngeal(NP) swabs in vial transport medium  Result Value Ref Range Status   SARS Coronavirus 2 by RT PCR NEGATIVE NEGATIVE Final    Comment: (NOTE) SARS-CoV-2 target nucleic acids are NOT DETECTED.  The SARS-CoV-2 RNA is generally detectable in upper respiratory specimens during the acute phase of infection. The lowest concentration of SARS-CoV-2 viral copies this assay can detect is 138 copies/mL. A negative result does not preclude SARS-Cov-2 infection and should not be used as the sole basis for treatment or other patient management decisions. A negative result may occur with  improper specimen collection/handling, submission of specimen other than nasopharyngeal swab, presence of viral mutation(s) within the areas targeted by this assay, and inadequate number of viral copies(<138 copies/mL). A negative result must be combined with clinical observations, patient history, and epidemiological information. The expected result is Negative.  Fact Sheet for Patients:   EntrepreneurPulse.com.au  Fact Sheet for Healthcare Providers:  IncredibleEmployment.be  This test is no t yet approved or cleared by the [place] FDA and  has been authorized for detection and/or diagnosis of SARS-CoV-2 by FDA under an Emergency Use Authorization (EUA). This EUA will remain  in effect (meaning this test can be used) for the duration of the COVID-19 declaration under Section 564(b)(1) of the Act, 21 U.S.C.section 360bbb-3(b)(1), unless the authorization is terminated  or revoked sooner.       Influenza A by PCR NEGATIVE NEGATIVE Final   Influenza B  by PCR NEGATIVE NEGATIVE Final    Comment: (NOTE) The Xpert Xpress SARS-CoV-2/FLU/RSV plus assay is intended as an aid in the diagnosis of influenza from Nasopharyngeal swab specimens and should not be used as a sole basis for treatment. Nasal washings and aspirates are unacceptable for Xpert Xpress SARS-CoV-2/FLU/RSV testing.  Fact Sheet for Patients: EntrepreneurPulse.com.au  Fact Sheet for Healthcare Providers: IncredibleEmployment.be  This test is not yet approved or cleared by the [place] FDA and has been authorized for detection and/or diagnosis of SARS-CoV-2 by FDA under an Emergency Use Authorization (EUA). This EUA will remain in effect (meaning this test can be used) for the duration of the COVID-19 declaration under Section 564(b)(1) of the Act, 21 U.S.C. section 360bbb-3(b)(1), unless the authorization is terminated or revoked.  Performed at [facility], [place] [address] [place], [place] [address]     Procedures/Studies: DG Shoulder Right  Result Date: 08/09/2021 CLINICAL DATA:  Right shoulder pain following seizure EXAM: RIGHT SHOULDER - 2+ VIEW COMPARISON:  None. FINDINGS: There is no evidence of fracture or dislocation. There is no evidence of arthropathy or other focal bone abnormality. Soft tissues are  unremarkable. IMPRESSION: Negative. Electronically Signed   By: Kerby Moors M.D.   On: 08/09/2021 12:35   CT HEAD WO CONTRAST  Result Date: 08/07/2021 CLINICAL DATA:  Seizure. EXAM: CT HEAD WITHOUT CONTRAST TECHNIQUE: Contiguous axial images were obtained from the base of the skull through the vertex without intravenous contrast. COMPARISON:  September 15, 2017 FINDINGS: Brain: No evidence of acute infarction, hemorrhage, hydrocephalus, extra-axial collection or mass lesion/mass effect. Vascular: No hyperdense vessel or unexpected calcification. Skull: Normal. Negative for fracture or focal lesion. Sinuses/Orbits: No acute finding. Other: None. IMPRESSION: No acute intracranial pathology. Electronically Signed   By: Virgina Norfolk M.D.   On: 08/07/2021 17:07   MR ANGIO HEAD WO CONTRAST  Result Date: 08/08/2021 CLINICAL DATA:  Neuro deficit, acute, stroke suspected EXAM: MRA HEAD WITHOUT CONTRAST TECHNIQUE: Angiographic images of the Circle of Willis were acquired using MRA technique without intravenous contrast. COMPARISON:  Concurrently performed noncontrast brain MRI. FINDINGS: Anterior circulation: The intracranial internal carotid arteries are patent. The M1 middle cerebral arteries are patent. No M2 proximal branch occlusion or high-grade proximal stenosis is identified. The anterior cerebral arteries are patent. No intracranial aneurysm is identified. Posterior circulation: The non dominant right vertebral artery is developmentally diminutive intracranially, but patent. The dominant intracranial left vertebral artery is patent without stenosis. The basilar artery is patent. The posterior cerebral arteries are patent. A right posterior communicating artery is present. The left posterior communicating artery is hypoplastic or absent. Anatomic variants: As described. Other: None. IMPRESSION: No intracranial large vessel occlusion or proximal high-grade arterial stenosis. Electronically Signed   By:  Kellie Simmering D.O.   On: 08/08/2021 13:50   MR ANGIO NECK W WO CONTRAST  Result Date: 08/08/2021 CLINICAL DATA:  Neuro deficit, acute EXAM: MRA NECK WITHOUT AND WITH CONTRAST TECHNIQUE: Multiplanar and multiecho pulse sequences of the neck were obtained without and with intravenous contrast. Angiographic images of the neck were obtained using MRA technique without and with intravenous contrast. CONTRAST:  36mL GADAVIST GADOBUTROL 1 MMOL/ML IV SOLN COMPARISON:  No pertinent prior exams available for comparison. FINDINGS: The common carotid and internal carotid arteries are patent within the neck without stenosis. The vertebral arteries are patent within the neck without appreciable stenosis. The left vertebral artery is dominant standard aortic branching. IMPRESSION: The common carotid, internal carotid and vertebral  arteries are patent within the neck without stenosis. Electronically Signed   By: Kellie Simmering D.O.   On: 08/08/2021 13:47   MR BRAIN W WO CONTRAST  Result Date: 08/08/2021 CLINICAL DATA:  Seizure, abnormal neuro exam. History of breast cancer. Rule out metastases. EXAM: MRI HEAD WITHOUT AND WITH CONTRAST TECHNIQUE: Multiplanar, multiecho pulse sequences of the brain and surrounding structures were obtained without and with intravenous contrast. CONTRAST:  37mL GADAVIST GADOBUTROL 1 MMOL/ML IV SOLN COMPARISON:  Head CT 08/08/2019 FINDINGS: Brain: Cerebral volume is normal for age. Minimal multifocal T2/FLAIR hyperintense signal abnormality within the cerebral white matter, nonspecific but most often secondary to chronic small vessel ischemia. Chronic lacunar infarct within the right pons (series 14, image 9). There is no acute infarct. No evidence of an intracranial mass. No chronic intracranial blood products. No extra-axial fluid collection. No midline shift. No pathologic intracranial enhancement identified. Vascular: Maintained flow voids within the proximal large arterial vessels. Non dominant  right vertebral artery. Skull and upper cervical spine: No focal suspicious marrow lesion. Sinuses/Orbits: Visualized orbits show no acute finding. No significant paranasal sinus disease. Other: Trace fluid within the bilateral mastoid air cells. IMPRESSION: No evidence of acute intracranial abnormality. No evidence of intracranial metastatic disease. Minimal multifocal T2 FLAIR hyperintense signal abnormality within the cerebral white matter, nonspecific but most often secondary to chronic small vessel ischemia. Trace fluid within the bilateral mastoid air cells. Electronically Signed   By: Kellie Simmering D.O.   On: 08/08/2021 13:43   EEG adult  Result Date: 08/07/2021 Willaim Rayas, MD     08/07/2021  9:12 PM [facility] Stat EEG Report 20 Minute Date of Study:   08/07/2021 20:53:00 Indication: Spells, Eval for Seizures, Technical Summary: A routine 20 channel electroencephalogram using the international 10-20 system of electrode placement was performed. Background: 9-10 Hz, Posterior dominant rhythm that attenuated with eye Opening States      Awake      Drowsy: were seen during drowsiness      Asleep: were seen during asleep Activation Procedures Hyperventilation: Not performed Photic Stimulation: Not performed Classification: Normal : There were no Epileptiform discharges Diagnosis: Normal Awake study. There are no epileptiform discharges. Clinical Correlation: This is a normal study. The absence of interictal epileptiform abnormalities does not exclude the diagnosis of a seizure disorder. Dr Apolinar [facility] [phone] Case [id number]   Overnight EEG with video  Result Date: 08/08/2021 Lora Havens, MD     08/10/2021  9:26 AM Patient Name: Sheila [name] MRN: [id number] Epilepsy Attending: Lora Havens Referring Physician/Provider: Dr Amie [place] Duration: 08/07/2021 2109 to 08/08/2021 9381 Patient history: 50 year old with  history of breast cancer with sudden onset of generalized tonic-clonic seizure-like activity while in recovery after getting a colonoscopy. EEG to evaluate for seizure. Level of alertness: Awake, asleep AEDs during EEG study: None Technical aspects: This EEG study was done with scalp electrodes positioned according to the 10-20 International system of electrode placement. Electrical activity was acquired at a sampling rate of 500Hz  and reviewed with a high frequency filter of 70Hz  and a low frequency filter of 1Hz . EEG data were recorded continuously and digitally stored. Description: The posterior dominant rhythm consists of 15 Hz activity of beta activity seen predominantly in posterior head regions, symmetric and reactive to eye opening and eye closing. Sleep was characterized by vertex waves, sleep spindles (12 to 14 Hz), maximal frontocentral region.  Hyperventilation and photic stimulation were not performed.  IMPRESSION: This study is within normal limits. The beta activity could be  seen due to benzodiapine use. No seizures or epileptiform discharges were seen throughout the recording. [place]: BNP (last 3 results) No results for input(s): BNP in the last 8760 hours. Basic Metabolic Panel: Recent Labs  Lab 08/07/21 1913 08/07/21 1914 08/08/21 0359 08/09/21 0217  NA 141 144 141 143  K 3.9 3.9 3.4* 3.5  CL 111  --  112* 112*  CO2 22  --  22 23  GLUCOSE 83  --  90 85  BUN 6  --  <5* 7  CREATININE 0.82  --  0.77 0.84  CALCIUM 8.4*  --  8.3* 8.6*   Liver Function Tests: Recent Labs  Lab 08/07/21 1913  AST 28  ALT 18  ALKPHOS 53  BILITOT 1.8*  PROT 6.2*  ALBUMIN 3.3*   No results for input(s): LIPASE, AMYLASE in the last 168 hours. Recent Labs  Lab 08/07/21 1930  AMMONIA 33   CBC: Recent Labs  Lab 08/07/21 1913 08/07/21 1914 08/08/21 0359  WBC 3.8*  --  3.3*  NEUTROABS 2.4  --   --   HGB 11.6* 11.9* 10.9*  HCT 35.9* 35.0* 33.1*  MCV 91.1  --  90.2   PLT 179  --  225   Cardiac Enzymes: No results for input(s): CKTOTAL, CKMB, CKMBINDEX, TROPONINI in the last 168 hours. BNP: Invalid input(s): POCBNP CBG: Recent Labs  Lab 08/07/21 1806  GLUCAP 74   D-Dimer No results for input(s): DDIMER in the last 72 hours. Hgb A1c No results for input(s): HGBA1C in the last 72 hours. Lipid Profile No results for input(s): CHOL, HDL, LDLCALC, TRIG, CHOLHDL, LDLDIRECT in the last 72 hours. Thyroid function studies No results for input(s): TSH, T4TOTAL, T3FREE, THYROIDAB in the last 72 hours.  Invalid input(s): FREET3 Anemia work up No results for input(s): VITAMINB12, FOLATE, FERRITIN, TIBC, IRON, RETICCTPCT in the last 72 hours. Urinalysis    Component Value Date/Time   COLORURINE STRAW (A) 08/07/2021 1555   APPEARANCEUR CLEAR 08/07/2021 1555   LABSPEC 1.004 (L) 08/07/2021 1555   PHURINE 6.0 08/07/2021 1555   GLUCOSEU NEGATIVE 08/07/2021 1555   HGBUR NEGATIVE 08/07/2021 1555   BILIRUBINUR NEGATIVE 08/07/2021 1555   KETONESUR NEGATIVE 08/07/2021 1555   PROTEINUR NEGATIVE 08/07/2021 1555   UROBILINOGEN 0.2 11/29/2019 1332   NITRITE NEGATIVE 08/07/2021 1555   LEUKOCYTESUR NEGATIVE 08/07/2021 1555   Sepsis Labs Invalid input(s): PROCALCITONIN,  WBC,  LACTICIDVEN Microbiology Recent Results (from the past 240 hour(s))  Resp Panel by RT-PCR (Flu A&B, Covid) Nasopharyngeal Swab     Status: None   Collection Time: 08/07/21  4:00 PM   Specimen: Nasopharyngeal Swab; Nasopharyngeal(NP) swabs in vial transport medium  Result Value Ref Range Status   SARS Coronavirus 2 by RT PCR NEGATIVE NEGATIVE Final    Comment: (NOTE) SARS-CoV-2 target nucleic acids are NOT DETECTED.  The SARS-CoV-2 RNA is generally detectable in upper respiratory specimens during the acute phase of infection. The lowest concentration of SARS-CoV-2 viral copies this assay can detect is 138 copies/mL. A negative result does not preclude SARS-Cov-2 infection and  should not be used as the sole basis for treatment or other patient management decisions. A negative result may occur with  improper specimen collection/handling, submission of specimen other than nasopharyngeal swab, presence of viral mutation(s) within the areas targeted by this assay, and inadequate number of viral copies(<138 copies/mL). A negative result must be  combined with clinical observations, patient history, and epidemiological information. The expected result is Negative.  Fact Sheet for Patients:  EntrepreneurPulse.com.au  Fact Sheet for Healthcare Providers:  IncredibleEmployment.be  This test is no t yet approved or cleared by the [place] FDA and  has been authorized for detection and/or diagnosis of SARS-CoV-2 by FDA under an Emergency Use Authorization (EUA). This EUA will remain  in effect (meaning this test can be used) for the duration of the COVID-19 declaration under Section 564(b)(1) of the Act, 21 U.S.C.section 360bbb-3(b)(1), unless the authorization is terminated  or revoked sooner.       Influenza A by PCR NEGATIVE NEGATIVE Final   Influenza B by PCR NEGATIVE NEGATIVE Final    Comment: (NOTE) The Xpert Xpress SARS-CoV-2/FLU/RSV plus assay is intended as an aid in the diagnosis of influenza from Nasopharyngeal swab specimens and should not be used as a sole basis for treatment. Nasal washings and aspirates are unacceptable for Xpert Xpress SARS-CoV-2/FLU/RSV testing.  Fact Sheet for Patients: EntrepreneurPulse.com.au  Fact Sheet for Healthcare Providers: IncredibleEmployment.be  This test is not yet approved or cleared by the [place] FDA and has been authorized for detection and/or diagnosis of SARS-CoV-2 by FDA under an Emergency Use Authorization (EUA). This EUA will remain in effect (meaning this test can be used) for the duration of the COVID-19 declaration  under Section 564(b)(1) of the Act, 21 U.S.C. section 360bbb-3(b)(1), unless the authorization is terminated or revoked.  Performed at [facility], [place] [address], [place], [place] [address]      Time coordinating discharge: 25 minutes  SIGNED: Antonieta Pert, MD  [facility] 08/10/2021, 9:48 AM  If 7PM-7AM, please contact night-coverage [email or web address]

## 2021-08-11 ENCOUNTER — Telehealth: Payer: Self-pay | Admitting: *Deleted

## 2021-08-11 NOTE — Telephone Encounter (Signed)
Follow up call made.  Pt cut the phone call short because her arm was hurting. She said she had just returned home from the hospital.

## 2021-08-12 ENCOUNTER — Encounter: Payer: Self-pay | Admitting: Internal Medicine

## 2021-09-25 ENCOUNTER — Telehealth: Payer: Self-pay | Admitting: *Deleted

## 2021-09-29 NOTE — Progress Notes (Addendum)
CLINIC:  Survivorship   Patient Care Team: Griffin Basil, MD as PCP - General (Obstetrics and Gynecology) Elouise [name], MD as Consulting Physician (Cardiology) Mauro Kaufmann, RN as Oncology Nurse Navigator Rockwell Germany, RN as Oncology Nurse Navigator Rolm Bookbinder, MD as Consulting Physician (General Surgery) Truitt Merle, MD as Consulting Physician (Hematology) Kyung Rudd, MD as Consulting Physician (Radiation Oncology) Alla Feeling, NP as Nurse Practitioner (Nurse Practitioner)   REASON FOR VISIT:  Routine follow-up post-treatment for a recent history of breast cancer.  BRIEF ONCOLOGIC HISTORY:  Oncology History Overview Note  Cancer Staging Malignant neoplasm of upper-outer quadrant of left breast in female, estrogen receptor positive ([place]) Staging form: Breast, AJCC 8th Edition - Clinical stage from 04/03/2021: Stage IA (cT1c, cN0, cM0, G2, ER+, PR+, HER2-) - Signed by Truitt Merle, MD on 04/14/2021 Stage prefix: Initial diagnosis Histologic grading system: 3 grade system    Malignant neoplasm of upper-outer quadrant of left breast in female, estrogen receptor positive ([place])  03/31/2021 Mammogram   Targeted ultrasound is performed, demonstrating an irregular hypoechoic mass with angular margins at the 2 o'clock position approximately 5 cm nipple measuring approximately 1.4 x 0.8 x 1.0 cm, demonstrating posterior acoustic shadowing, associated with microcalcifications, corresponding to the screening mammographic finding.   Immediately adjacent to the dominant mass is a hypoechoic mass measuring 0.5 x 0.4 x 0.4 cm, demonstrating posterior acoustic enhancement and no internal power Doppler flow.   Sonographic evaluation of the LEFT axilla demonstrates no pathologic lymphadenopathy.   04/03/2021 Cancer Staging   Staging form: Breast, AJCC 8th Edition - Clinical stage from 04/03/2021: Stage IA (cT1c, cN0, cM0, G2, ER+, PR+, HER2-) - Signed by Truitt Merle, MD  on 04/14/2021 Stage prefix: Initial diagnosis Histologic grading system: 3 grade system    04/03/2021 Initial Biopsy   Diagnosis Breast, left, needle core biopsy, 2 o'clock - INVASIVE MAMMARY CARCINOMA - SEE COMMENT Microscopic Comment The biopsy material shows an infiltrative proliferation of cells with large vesicular nuclei with inconspicuous nucleoli, arranged linearly and in small clusters. Based on the biopsy, the carcinoma appears Nottingham grade 2 of 3 and measures 1.0 cm in greatest linear extent. E-cadherin and prognostic markers (ER/PR/ki-67/HER2)are pending and will be reported in an addendum. Dr. Jeannie Done reviewed the case and agrees with the above diagnosis. These results were called to [facility] on Apr 06, 2021.  E-cadherin is POSITIVE supporting a ductal origin.   04/03/2021 Receptors her2   PROGNOSTIC INDICATORS Results: IMMUNOHISTOCHEMICAL AND MORPHOMETRIC ANALYSIS PERFORMED MANUALLY The tumor cells are NEGATIVE for Her2 (1+). Estrogen Receptor: 70%, POSITIVE, STRONG STAINING INTENSITY Progesterone Receptor: >95%, POSITIVE, STRONG STAINING INTENSITY Proliferation Marker Ki67: 15%   04/05/2021 Imaging   CT AP  IMPRESSION: 1. No acute abnormality of the abdomen or pelvis. 2. Unchanged appearance of inferior right hepatic lobe lesion containing numerous calcifications.     04/08/2021 Initial Diagnosis   Malignant neoplasm of upper-outer quadrant of left breast in female, estrogen receptor positive ([place])   04/21/2021 Genetic Testing   Negative genetic testing:  No pathogenic variants detected on the [employer] BRCAplus panel (report date 04/21/2021) or CancerNext-Expanded + RNAinsight panel (report date 04/27/2021). 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   The BRCAplus panel offered by [employer] and includes sequencing and deletion/duplication analysis for the following 8 genes: ATM, BRCA1,  BRCA2, CDH1, CHEK2, PALB2, PTEN, and TP53. The CancerNext-Expanded + RNAinsight gene panel offered by  [facility] and includes sequencing and rearrangement analysis for the following 77 genes: AIP, ALK, APC, ATM, AXIN2, BAP1, BARD1, BLM, BMPR1A, BRCA1, BRCA2, BRIP1, CDC73, CDH1, CDK4, CDKN1B, CDKN2A, CHEK2, CTNNA1, DICER1, FANCC, FH, FLCN, GALNT12, KIF1B, LZTR1, MAX, MEN1, MET, MLH1, MSH2, MSH3, MSH6, MUTYH, NBN, NF1, NF2, NTHL1, PALB2, PHOX2B, PMS2, POT1, PRKAR1A, PTCH1, PTEN, RAD51C, RAD51D, RB1, RECQL, RET, SDHA, SDHAF2, SDHB, SDHC, SDHD, SMAD4, SMARCA4, SMARCB1, SMARCE1, STK11, SUFU, TMEM127, TP53, TSC1, TSC2, VHL and XRCC2 (sequencing and deletion/duplication); EGFR, EGLN1, HOXB13, KIT, MITF, PDGFRA, POLD1 and POLE (sequencing only); EPCAM and GREM1 (deletion/duplication only). RNA data is routinely analyzed for use in variant interpretation for all genes.   04/29/2021 Surgery   FINAL MICROSCOPIC DIAGNOSIS:   A. BREAST, LEFT, LUMPECTOMY:  - Invasive and in situ ductal carcinoma, 1.6 cm.  - Invasive carcinoma 0.1 cm from the lateral margin.  - Biopsy site and biopsy clip.  - See oncology table.   B. BREAST, LEFT POSTERIOR MARGIN, EXCISION:  - Fibrocystic changes with usual ductal hyperplasia.  - Final posterior margin negative for carcinoma.   C. BREAST, LEFT SUPERIOR MARGIN, EXCISION:  - Benign breast tissue.  - Final superior margin negative for carcinoma.   D. LYMPH NODE, LEFT AXILLARY, SENTINEL, EXCISION:  - One lymph node negative for metastatic carcinoma (0/1).   E. BREAST, LEFT MEDIAL MARGIN, EXCISION:  - Benign breast tissue.  - Final medial margin negative for carcinoma.    04/29/2021 Oncotype testing   Recurrence score of 19, distant recurrence risk of 6%, no benefit from chemotherapy   04/29/2021 Cancer Staging   Staging form: Breast, AJCC 8th Edition - Pathologic stage from 04/29/2021: Stage IA (pT1c, pN0, cM0, G2, ER+, PR+, HER2-, Oncotype DX score: 19) - Signed by  Alla Feeling, NP on 09/30/2021 Stage prefix: Initial diagnosis Nuclear grade: G2 Multigene prognostic tests performed: Oncotype DX Recurrence score range: Greater than or equal to 11 Histologic grading system: 3 grade system    09/30/2021 Survivorship   SCP delivered by Cira Rue, NP     INTERVAL HISTORY:  Ms. Herrada presents to the [facility] today for our initial meeting to review her survivorship care plan detailing her treatment course for breast cancer, as well as monitoring long-term side effects of that treatment, education regarding health maintenance, screening, and overall wellness and health promotion.     Overall, Ms. Bautch has not been doing very well since last visit 07/02/2021.  She underwent screening colonoscopy and developed seizures in recovery, she was brought to ED and admitted for work-up, stay 3 days.  Work-up essentially unremarkable, the cause was unknown.  No antiseizure meds prescribed at discharge.  She tells me she continues to have seizure-like activity "episodes" at home most nights.  She has shaking, cannot breathe, and little memory of the events but her husband confirms that.  She has right-sided weakness, some pain, and feeling off balance.  She has not been out much since this began, she is limiting social and family interaction, she does not want to have an episode and ruin someone's day or cause them to be unsafe.  But she "wants my life back."  She woke up this morning and noted some redness and soreness at the left breast incision.  She has been doing PT exercises with good range of motion.  She has not started tamoxifen yet.   ONCOLOGY TREATMENT TEAM:  1. Surgeon:  Dr. Donne Hazel at [facility] 2. Medical Oncologist: Dr. Burr Medico 3. Radiation Oncologist: Dr.  Moody    PAST MEDICAL/SURGICAL HISTORY:  Past Medical History:  Diagnosis Date   Anemia    Anginal pain ([place])    Breast cancer ([place])    Family history of bone cancer     Family history of breast cancer    Family history of prostate cancer    Family history of throat cancer    Malignant neoplasm of upper-outer quadrant of left breast in female, estrogen receptor positive ([place]) 04/08/2021   Past Surgical History:  Procedure Laterality Date   AXILLARY LYMPH NODE BIOPSY Left 04/29/2021   Procedure: AXILLARY LYMPH NODE BIOPSY;  Surgeon: Rolm Bookbinder, MD;  Location: [facility];  Service: General;  Laterality: Left;   BREAST LUMPECTOMY WITH RADIOACTIVE SEED AND SENTINEL LYMPH NODE BIOPSY Left 04/29/2021   Procedure: LEFT BREAST LUMPECTOMY WITH RADIOACTIVE SEED;  Surgeon: Rolm Bookbinder, MD;  Location: [facility];  Service: General;  Laterality: Left;   CARDIAC CATHETERIZATION     LEFT HEART CATHETERIZATION WITH CORONARY ANGIOGRAM N/A 05/30/2014   Procedure: LEFT HEART CATHETERIZATION WITH CORONARY ANGIOGRAM;  Surgeon: Peter M Martinique, MD;  Location: [facility] CATH LAB;  Service: Cardiovascular;  Laterality: N/A;   TUBAL LIGATION       ALLERGIES:  Allergies  Allergen Reactions   Anesthetics, Am[place] Other (See Comments)    seizures   Other Other (See Comments)    General Anesthesia caused seizures-Propofol     CURRENT MEDICATIONS:  Outpatient Encounter Medications as of 09/30/2021  Medication Sig Note   ibuprofen (ADVIL) 800 MG tablet Take 1 tablet (800 mg total) by mouth every 8 (eight) hours as needed for moderate pain. (Patient not taking: No sig reported)    methocarbamol (ROBAXIN) 500 MG tablet Take 1 tablet (500 mg total) by mouth every 6 (six) hours as needed for up to 30 doses for muscle spasms.    prochlorperazine (COMPAZINE) 10 MG tablet Take 1 tablet (10 mg total) by mouth every 6 (six) hours as needed for nausea or vomiting. (Patient not taking: No sig reported)    tamoxifen (NOLVADEX) 20 MG tablet Take 1 tablet (20 mg total) by mouth daily. 08/07/2021: On Hold   triamcinolone cream (KENALOG) 0.1 % Apply topically 2 (two) times daily.    No  facility-administered encounter medications on file as of 09/30/2021.     ONCOLOGIC FAMILY HISTORY:  Family History  Problem Relation Age of Onset   Heart disease Mother    Hypertension Mother    Heart Problems Mother    Hyperlipidemia Mother    Breast cancer Mother 39   Diabetes Father    Seizures Father    Heart disease Sister    Crohn's disease Sister    Crohn's disease Brother    Hypertension Maternal Aunt    Colon cancer Maternal Uncle    Hypertension Maternal Uncle    Prostate cancer Maternal Uncle    Prostate cancer Maternal Uncle    Cancer Paternal Aunt        unknown type   Cancer Paternal Uncle        unknown type   Cancer Paternal Uncle        unknown type   Throat cancer Paternal Uncle    Hypertension Maternal Grandmother    Stroke Maternal Grandmother    Breast cancer Maternal Grandmother 86   Stroke Maternal Grandfather    Pancreatic cancer Cousin    Breast cancer Cousin        dx 15s, bilateral mastectomies (maternal first cousin)  Bone cancer Nephew 11       knee, great-nephew   Breast cancer Other        great-aunt (MGF's sister)   Breast cancer Other        first cousin once removed (MGF's niece)   Breast cancer Other        first cousin once removed (MGF's niece)   Cancer Other        unknown type, great-uncle (MGM's brother)   Breast cancer Other        first cousin once removed (MGM's niece)   Breast cancer Other        first cousin once removed (MGM's niece)   Prostate cancer Other        first cousin once removed (MGM's nephew)   Breast cancer Other        second cousin (MGM's great-niece)   Cancer Other        unknown type, dx 68s (maternal aunt's granddaughter)   Sickle cell anemia Other    Esophageal cancer Neg Hx    Stomach cancer Neg Hx    Rectal cancer Neg Hx      GENETIC COUNSELING/TESTING: Yes, negative except VUS in SDHB gene  SOCIAL HISTORY:  Edynn Gillock Sponsel married and lives with her spouse in [place], [place].  She has children and grandchildren.   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 She denies any current or history of tobacco, alcohol, or illicit drug use.     PHYSICAL EXAMINATION:  Vital Signs:   Vitals:   09/30/21 1252  BP: (!) 137/91  Pulse: 85  Resp: 18  Temp: 98.4 F (36.9 C)  SpO2: 100%   Filed Weights   09/30/21 1252  Weight: 215 lb (97.5 kg)   General: Well-nourished, well-appearing female in no acute distress.   HEENT: Sclerae anicteric.  Lymph: No cervical, supraclavicular, or infraclavicular lymphadenopathy noted on palpation.  Respiratory: breathing non-labored.  GI: Abdomen soft and round; non-tender, non-distended. Bowel sounds normoactive.  Neuro: No focal deficits. Steady gait.  Psych: Mood and affect normal and appropriate for situation.  Extremities: No edema. MSK: No focal spinal tenderness to palpation.  Full range of motion in bilateral upper extremities Skin: Warm and dry. Breast exam: Breasts are symmetrical without nipple discharge or inversion.  S/p left lumpectomy, incisions completely healed with mild granulation tissue.  Mild hyperpigmentation.  No palpable mass or nodularity in either breast or axilla that I could appreciate  LABORATORY DATA:  None for this visit.  DIAGNOSTIC IMAGING:  None for this visit.      ASSESSMENT AND PLAN:  Ms.. Devora is a pleasant 50 y.o. female with Stage 1A left breast invasive ductal carcinoma, ER+/PR+/HER2-, diagnosed in 03/2021, treated with lumpectomy and adjuvant radiation therapy; she has not began antiestrogen therapy yet.  She presents to the Survivorship Clinic for our initial meeting and routine follow-up post-completion of treatment for breast cancer.    1. Stage 1A left breast cancer:  Ms. Schrodt is continuing to recover from definitive treatment for breast cancer.  Breast exam shows mild granulation tissue at the left breast incision, I recommend topical Neosporin.  She will  follow-up with her medical oncologist, Dr. Burr Medico in 12/2021 with history and physical exam per surveillance protocol.  Due to her ongoing issues with seizure activity after screening colonoscopy in September she has not started tamoxifen yet.  I have reviewed her hospital work up and reached out to the neurologist who saw her inpatient.  Today, a comprehensive survivorship care plan and treatment summary was reviewed with the patient today detailing her breast cancer diagnosis, treatment course, potential late/long-term effects of treatment, appropriate follow-up care with recommendations for the future, and patient education resources.  A copy of this summary, along with a letter will be sent to the patient's primary care provider via mail/fax/In Basket message after today's visit.    2. Bone health:  Given Ms. Silverstein's age and pre/perimenopausal status and anticipated therapy with tamoxifen, we do not need to screen for osteopenia/osteoporosis yet.  In the meantime, she was encouraged to increase her consumption of foods rich in calcium, as well as increase her weight-bearing activities.  She was given education on specific activities to promote bone health.  3. Cancer screening:  Due to Ms. Fails's history and her age, she should receive screening for skin cancers, colon cancer, and gynecologic cancers.  The information and recommendations are listed on the patient's comprehensive care plan/treatment summary and were reviewed in detail with the patient.    4. Health maintenance and wellness promotion: Ms. Ligas was encouraged to consume 5-7 servings of fruits and vegetables per day. We reviewed the "Nutrition Rainbow" handout. She was also encouraged to engage in moderate to vigorous exercise for 30 minutes per day most days of the week. She was instructed to limit her alcohol consumption and continue to abstain from tobacco use.   5. Support services/counseling: It is not uncommon for this period  of the patient's cancer care trajectory to be one of many emotions and stressors.  We discussed an opportunity for her to participate in the next session of [facility] ("Finding Your New Normal") support group series designed for patients after they have completed treatment.   Ms. Buenaventura was encouraged to take advantage of our many other support services programs, support groups, and/or counseling in coping with her new life as a cancer survivor after completing anti-cancer treatment.  She was offered support today through active listening and expressive supportive counseling.  She was given information regarding our available services and encouraged to contact me with any questions or for help enrolling in any of our support group/programs.    Dispo:   -Return to cancer center 12/2021 as scheduled, phone f/up in 6 weeks -will check [place], estradiol and iron studies at next visit. She will resume oral iron -Mammogram due in 03/2022 -Follow up with surgery as indicated -pending f/up with neuro  -She is welcome to return back to the Survivorship Clinic at any time; no additional follow-up needed at this time.  -Consider referral back to survivorship as a long-term survivor for continued surveillance  Orders Placed This Encounter  Procedures   MM DIAG BREAST TOMO BILATERAL    Standing Status:   Future    Standing Expiration Date:   09/30/2022    Order Specific Question:   Reason for Exam (SYMPTOM  OR DIAGNOSIS REQUIRED)    Answer:   h/o left breast cancer 03/2021 s/p lump/RT    Order Specific Question:   Is the patient pregnant?    Answer:   No    Order Specific Question:   Preferred imaging location?    Answer:   [facility]   Ambulatory referral to Neurology    Referral Priority:   Urgent    Referral Type:   Consultation    Referral Reason:   Specialty Services Required    Referred to Provider:   Marcial Pacas, MD    Requested Specialty:  Neurology    Number of Visits Requested:   1     A  total of (35) minutes of face-to-face time was spent with this patient with greater than 50% of that time in counseling and care-coordination.   Cira Rue, NP Survivorship Program [facility] [phone]   Note: PRIMARY CARE PROVIDER Griffin Basil, [place] [phone]

## 2021-09-30 ENCOUNTER — Inpatient Hospital Stay: Payer: Medicaid Other | Attending: Hematology | Admitting: Nurse Practitioner

## 2021-09-30 ENCOUNTER — Encounter: Payer: Self-pay | Admitting: Nurse Practitioner

## 2021-09-30 ENCOUNTER — Other Ambulatory Visit: Payer: Self-pay

## 2021-09-30 VITALS — BP 137/91 | HR 85 | Temp 98.4°F | Resp 18 | Ht 69.02 in | Wt 215.0 lb

## 2021-09-30 DIAGNOSIS — Z808 Family history of malignant neoplasm of other organs or systems: Secondary | ICD-10-CM | POA: Insufficient documentation

## 2021-09-30 DIAGNOSIS — Z8042 Family history of malignant neoplasm of prostate: Secondary | ICD-10-CM | POA: Insufficient documentation

## 2021-09-30 DIAGNOSIS — Z800 Family history of malignant neoplasm of digestive organs: Secondary | ICD-10-CM | POA: Diagnosis not present

## 2021-09-30 DIAGNOSIS — Z170 Estrogen receptor positive status [ER+]: Secondary | ICD-10-CM | POA: Diagnosis not present

## 2021-09-30 DIAGNOSIS — R569 Unspecified convulsions: Secondary | ICD-10-CM | POA: Diagnosis not present

## 2021-09-30 DIAGNOSIS — Z803 Family history of malignant neoplasm of breast: Secondary | ICD-10-CM | POA: Diagnosis not present

## 2021-09-30 DIAGNOSIS — C50412 Malignant neoplasm of upper-outer quadrant of left female breast: Secondary | ICD-10-CM | POA: Diagnosis not present

## 2021-09-30 DIAGNOSIS — Z923 Personal history of irradiation: Secondary | ICD-10-CM | POA: Insufficient documentation

## 2021-09-30 DIAGNOSIS — Z801 Family history of malignant neoplasm of trachea, bronchus and lung: Secondary | ICD-10-CM | POA: Diagnosis not present

## 2021-09-30 DIAGNOSIS — Z79899 Other long term (current) drug therapy: Secondary | ICD-10-CM | POA: Diagnosis not present

## 2021-09-30 NOTE — Addendum Note (Signed)
Addended by: Alla Feeling on: 09/30/2021 01:55 PM   Modules accepted: Orders

## 2021-10-01 ENCOUNTER — Telehealth: Payer: Self-pay | Admitting: Nurse Practitioner

## 2021-10-01 NOTE — Telephone Encounter (Signed)
Scheduled per 11/16 los, pt has been called and confirmed appt

## 2021-10-07 ENCOUNTER — Other Ambulatory Visit: Payer: Self-pay | Admitting: Family Medicine

## 2021-10-07 ENCOUNTER — Other Ambulatory Visit: Payer: Self-pay | Admitting: Radiation Oncology

## 2021-10-07 ENCOUNTER — Other Ambulatory Visit: Payer: Self-pay | Admitting: Internal Medicine

## 2021-10-07 DIAGNOSIS — M533 Sacrococcygeal disorders, not elsewhere classified: Secondary | ICD-10-CM

## 2021-10-07 DIAGNOSIS — Z1211 Encounter for screening for malignant neoplasm of colon: Secondary | ICD-10-CM

## 2021-10-07 NOTE — Telephone Encounter (Signed)
Provider is not at [facility]- forwarded back to pharmacy Requested Prescriptions  Refused Prescriptions Disp Refills  . promethazine-dextromethorphan (PROMETHAZINE-DM) 6.25-15 MG/5ML syrup [Pharmacy Med Name: PROMETHAZINE DM SYRUP] 100 mL 0    Sig: TAKE 5 ML BY MOUTH FOUR TIMES DAILY AS NEEDED FOR COUGH     Ear, Nose, and Throat:  Antitussives/Expectorants Failed - 10/07/2021  3:33 AM      Failed - Valid encounter within last 12 months    Recent Outpatient Visits   None            . predniSONE (DELTASONE) 20 MG tablet [Pharmacy Med Name: PREDNISONE 20MG  TABLETS] 10 tablet 0    Sig: TAKE 2 TABLETS(40 MG) BY MOUTH DAILY WITH BREAKFAST     Not Delegated - Endocrinology:  Oral Corticosteroids Failed - 10/07/2021  3:33 AM      Failed - This refill cannot be delegated      Failed - Last BP in normal range    BP Readings from Last 1 Encounters:  09/30/21 (!) 137/91         Failed - Valid encounter within last 6 months    Recent Outpatient Visits   None            . ketorolac (TORADOL) 10 MG tablet [Pharmacy Med Name: KETOROLAC 10MG  TABLETS] 15 tablet     Sig: TAKE 1 TABLET BY MOUTH EVERY 8 HOURS AS NEEDED FOR PAIN.     Analgesics:  NSAIDS Failed - 10/07/2021  3:33 AM      Failed - HGB in normal range and within 360 days    Hemoglobin  Date Value Ref Range Status  08/08/2021 10.9 (L) 12.0 - 15.0 g/dL Final  04/15/2021 12.2 12.0 - 15.0 g/dL Final         Failed - Valid encounter within last 12 months    Recent Outpatient Visits   None            Passed - Cr in normal range and within 360 days    Creatinine  Date Value Ref Range Status  04/15/2021 0.95 0.44 - 1.00 mg/dL Final   Creatinine, Ser  Date Value Ref Range Status  08/09/2021 0.84 0.44 - 1.00 mg/dL Final         Passed - Patient is not pregnant

## 2021-10-15 ENCOUNTER — Encounter: Payer: Self-pay | Admitting: Nurse Practitioner

## 2021-10-29 ENCOUNTER — Other Ambulatory Visit: Payer: Self-pay | Admitting: Nurse Practitioner

## 2021-10-29 MED ORDER — TAMOXIFEN CITRATE 20 MG PO TABS: 20.0000 mg | ORAL_TABLET | Freq: Every day | ORAL | 3 refills | Status: DC

## 2021-10-29 NOTE — Progress Notes (Signed)
I spoke to Ms. Carns, she is feeling better than I last saw her 09/30/21, no seizures. She has residual memory loss and right side weakness but she can function better in general. I recommend to begin tamoxifen, sent Rx in. She will see neuro 11/11/21 and oncology in 12/2021. She knows to call sooner with new/worsening complaints. Pt agrees with the plan.   Cira Rue, NP

## 2021-11-11 ENCOUNTER — Ambulatory Visit: Payer: Medicaid Other | Admitting: Neurology

## 2021-11-11 ENCOUNTER — Encounter: Payer: Self-pay | Admitting: Neurology

## 2021-11-11 VITALS — BP 118/75 | HR 95 | Ht 69.0 in | Wt 204.5 lb

## 2021-11-11 DIAGNOSIS — Z803 Family history of malignant neoplasm of breast: Secondary | ICD-10-CM | POA: Diagnosis not present

## 2021-11-11 DIAGNOSIS — R569 Unspecified convulsions: Secondary | ICD-10-CM

## 2021-11-11 NOTE — Patient Instructions (Signed)
Continue current medications  Routine EEG  Return if worse

## 2021-11-11 NOTE — Progress Notes (Signed)
[facility]  PATIENT: Sheila [name] DOB: [birth year]  REQUESTING CLINICIAN: Alla Feeling, NP HISTORY FROM: Patient  REASON FOR VISIT: Seizure after colonoscopy.    HISTORICAL  CHIEF COMPLAINT:  Chief Complaint  Patient presents with   New Patient (Initial Visit)    Rm 12. Alone. NP/internal referral for word finding difficulty. She reports difficulty with memory.    HISTORY OF PRESENT ILLNESS:  This is a 50 year old woman with past medical history of breast cancer which was treated with radiation, hypertension who is presenting after a single seizures status post colonoscopy on September 23.  Patient reports after colonoscopy she had a seizure, does not remember the event, reported waking up 3 days later.  She was not started on any antiseizure medication.  Since being discharged from home patient report additional seizures during her sleep, she does not remember but she reports incident of tongue biting and urinary incontinence and when she wakes up in the morning her body "will really bad.  I spoke with the husband over the phone who denies any seizure activity at night, stated that he does not he did not witness any of the events.   Patient also reported her memory is really bad, described as being forgetful, she has difficulty with name, misplacing items, she is currently not driving because she does report instance of getting lost while driving.  She did forget to pay her bills, now her daughter is handling her bills.  She does not have issue with forgetting family members name.   Patient reports that currently she is under a lot of stress mainly from her son.  She believes that her son is in danger due to gang members trying to recruit him.    OTHER MEDICAL CONDITIONS: History of breast cancer treated with radiation, HTN   REVIEW OF SYSTEMS: Full 14 system review of systems performed and negative with exception of: as noted in the  HPI  ALLERGIES: Allergies  Allergen Reactions   Anesthetics, Amide Other (See Comments)    seizures   Other Other (See Comments)    General Anesthesia caused seizures-Propofol    HOME MEDICATIONS: Outpatient Medications Prior to Visit  Medication Sig Dispense Refill   ibuprofen (ADVIL) 800 MG tablet Take 1 tablet (800 mg total) by mouth every 8 (eight) hours as needed for moderate pain. 21 tablet 0   methocarbamol (ROBAXIN) 500 MG tablet Take 1 tablet (500 mg total) by mouth every 6 (six) hours as needed for up to 30 doses for muscle spasms. 30 tablet 0   prochlorperazine (COMPAZINE) 10 MG tablet TAKE 1 TABLET(10 MG) BY MOUTH EVERY 6 HOURS AS NEEDED FOR NAUSEA OR VOMITING 30 tablet 0   triamcinolone cream (KENALOG) 0.1 % Apply topically 2 (two) times daily.     tamoxifen (NOLVADEX) 20 MG tablet Take 1 tablet (20 mg total) by mouth daily. (Patient not taking: Reported on 11/11/2021) 30 tablet 3   No facility-administered medications prior to visit.    PAST MEDICAL HISTORY: Past Medical History:  Diagnosis Date   Anemia    Anginal pain ([place])    Breast cancer ([place])    Family history of bone cancer    Family history of breast cancer    Family history of prostate cancer    Family history of throat cancer    Malignant neoplasm of upper-outer quadrant of left breast in female, estrogen receptor positive ([place]) 04/08/2021    PAST SURGICAL HISTORY: Past Surgical History:  Procedure  Laterality Date   AXILLARY LYMPH NODE BIOPSY Left 04/29/2021   Procedure: AXILLARY LYMPH NODE BIOPSY;  Surgeon: Rolm Bookbinder, MD;  Location: [facility];  Service: General;  Laterality: Left;   BREAST LUMPECTOMY WITH RADIOACTIVE SEED AND SENTINEL LYMPH NODE BIOPSY Left 04/29/2021   Procedure: LEFT BREAST LUMPECTOMY WITH RADIOACTIVE SEED;  Surgeon: Rolm Bookbinder, MD;  Location: [facility];  Service: General;  Laterality: Left;   CARDIAC CATHETERIZATION     LEFT HEART CATHETERIZATION WITH CORONARY ANGIOGRAM  N/A 05/30/2014   Procedure: LEFT HEART CATHETERIZATION WITH CORONARY ANGIOGRAM;  Surgeon: Peter M Martinique, MD;  Location: [facility] CATH [facility];  Service: Cardiovascular;  Laterality: N/A;   TUBAL LIGATION      FAMILY HISTORY: Family History  Problem Relation Age of Onset   Heart disease Mother    Hypertension Mother    Heart Problems Mother    Hyperlipidemia Mother    Breast cancer Mother 73   Diabetes Father    Seizures Father    Heart disease Sister    Crohn's disease Sister    Crohn's disease Brother    Hypertension Maternal Aunt    Colon cancer Maternal Uncle    Hypertension Maternal Uncle    Prostate cancer Maternal Uncle    Prostate cancer Maternal Uncle    Cancer Paternal Aunt        unknown type   Cancer Paternal Uncle        unknown type   Cancer Paternal Uncle        unknown type   Throat cancer Paternal Uncle    Hypertension Maternal Grandmother    Stroke Maternal Grandmother    Breast cancer Maternal Grandmother 50   Stroke Maternal Grandfather    Pancreatic cancer Cousin    Breast cancer Cousin        dx 78s, bilateral mastectomies (maternal first cousin)   Bone cancer Nephew 11       knee, great-nephew   Breast cancer Other        great-aunt (MGF's sister)   Breast cancer Other        first cousin once removed (MGF's niece)   Breast cancer Other        first cousin once removed (MGF's niece)   Cancer Other        unknown type, great-uncle (MGM's brother)   Breast cancer Other        first cousin once removed (MGM's niece)   Breast cancer Other        first cousin once removed (MGM's niece)   Prostate cancer Other        first cousin once removed (MGM's nephew)   Breast cancer Other        second cousin (MGM's great-niece)   Cancer Other        unknown type, dx 40s (maternal aunt's granddaughter)   Sickle cell anemia Other    Esophageal cancer Neg Hx    Stomach cancer Neg Hx    Rectal cancer Neg Hx     SOCIAL HISTORY: Social History   Socioeconomic  History   Marital status: Married    Spouse name: Not on file   Number of children: 4   Years of education: Not on file   Highest education level: Not on file  Occupational History   Occupation: Medical laboratory scientific officer: [employer]  Tobacco Use   Smoking status: Never   Smokeless tobacco: Never  Vaping Use   Vaping Use:  Never used  Substance and Sexual Activity   Alcohol use: No   Drug use: No   Sexual activity: Yes    Birth control/protection: Surgical  Other Topics Concern   Not on file  Social History Narrative   Not on file   Social Determinants of Health   Financial Resource Strain: Not on file  Food Insecurity: No Food Insecurity   Worried About Running Out of Food in the Last Year: Never true   Ran Out of Food in the Last Year: Never true  Transportation Needs: No Transportation Needs   Lack of Transportation (Medical): No   Lack of Transportation (Non-Medical): No  Physical Activity: Not on file  Stress: Not on file  Social Connections: Not on file  Intimate Partner Violence: Not on file    PHYSICAL EXAM  GENERAL EXAM/CONSTITUTIONAL: Vitals:  Vitals:   11/11/21 1416  BP: 118/75  Pulse: 95  Weight: 204 lb 8 oz (92.8 kg)  Height: 5\' 9"  (1.753 m)   Body mass index is 30.2 kg/m. Wt Readings from Last 3 Encounters:  11/11/21 204 lb 8 oz (92.8 kg)  09/30/21 215 lb (97.5 kg)  08/08/21 207 lb 0.2 oz (93.9 kg)   Patient is in no distress; well developed, nourished and groomed; neck is supple  CARDIOVASCULAR: Examination of carotid arteries is normal; no carotid bruits Regular rate and rhythm, no murmurs Examination of peripheral vascular system by observation and palpation is normal  EYES: Pupils round and reactive to light, Visual fields full to confrontation, Extraocular movements intacts,   MUSCULOSKELETAL: Gait, strength, tone, movements noted in Neurologic exam below  NEUROLOGIC: MENTAL STATUS:  MMSE - [place] Exam 11/11/2021   Orientation to time 5  Orientation to Place 5  Registration 3  Attention/ Calculation 4  Recall 3  Language- name 2 objects 2  Language- repeat 1  Language- follow 3 step command 3  Language- read & follow direction 1  Write a sentence 1  Copy design 1  Total score 29   awake, alert, oriented to person, place and time recent and remote memory intact normal attention and concentration language fluent, comprehension intact, naming intact fund of knowledge appropriate  CRANIAL NERVE: 2nd, 3rd, 4th, 6th - pupils equal and reactive to light, visual fields full to confrontation, extraocular muscles intact, no nystagmus 5th - facial sensation symmetric 7th - facial strength symmetric 8th - hearing intact 9th - palate elevates symmetrically, uvula midline 11th - shoulder shrug symmetric 12th - tongue protrusion midline  MOTOR:  normal bulk and tone, full strength in the BUE, BLE  SENSORY:  normal and symmetric to light touch, pinprick, temperature, vibration  COORDINATION:  finger-nose-finger, fine finger movements normal  REFLEXES:  deep tendon reflexes present and symmetric  GAIT/STATION:  normal     DIAGNOSTIC DATA (LABS, IMAGING, TESTING) - I reviewed patient records, labs, notes, testing and imaging myself where available.  Lab Results  Component Value Date   WBC 3.3 (L) 08/08/2021   HGB 10.9 (L) 08/08/2021   HCT 33.1 (L) 08/08/2021   MCV 90.2 08/08/2021   PLT 225 08/08/2021      Component Value Date/Time   NA 143 08/09/2021 0217   NA 143 06/30/2020 1553   K 3.5 08/09/2021 0217   CL 112 (H) 08/09/2021 0217   CO2 23 08/09/2021 0217   GLUCOSE 85 08/09/2021 0217   BUN 7 08/09/2021 0217   BUN 8 06/30/2020 1553   CREATININE 0.84 08/09/2021 0217  CREATININE 0.95 04/15/2021 0834   CALCIUM 8.6 (L) 08/09/2021 0217   PROT 6.2 (L) 08/07/2021 1913   ALBUMIN 3.3 (L) 08/07/2021 1913   AST 28 08/07/2021 1913   AST 19 04/15/2021 0834   ALT 18 08/07/2021 1913    ALT 14 04/15/2021 0834   ALKPHOS 53 08/07/2021 1913   BILITOT 1.8 (H) 08/07/2021 1913   BILITOT 1.0 04/15/2021 0834   GFRNONAA >60 08/09/2021 0217   GFRNONAA >60 04/15/2021 0834   GFRAA 82 06/30/2020 1553   Lab Results  Component Value Date   CHOL 213 (H) 06/30/2020   HDL 55 06/30/2020   LDLCALC 141 (H) 06/30/2020   TRIG 93 06/30/2020   CHOLHDL 3.9 06/30/2020   Lab Results  Component Value Date   HGBA1C 5.3 06/30/2020   Lab Results  Component Value Date   VITAMINB12 179 (L) 05/19/2021   Lab Results  Component Value Date   TSH 1.210 05/30/2014    MRI Brain  No evidence of acute intracranial abnormality.  No evidence of intracranial metastatic disease. Minimal multifocal T2 FLAIR hyperintense signal abnormality within the cerebral white matter, nonspecific but most often secondary to chronic small vessel ischemia Trace fluid within the bilateral mastoid air cells.   MRA Head  No intracranial large vessel occlusion or proximal high-grade arterial stenosis   MRA Neck The common carotid, internal carotid and vertebral arteries are patent within the neck without stenosis.   EEG This study is within normal limits. The beta activity could be  seen due to benzodiapine use. No seizures or epileptiform discharges were seen throughout the recording.  ASSESSMENT AND PLAN  50 y.o. year old female with past medical history of breast cancer status post radiation treatment who is presenting after a single seizure following colonoscopy.  At that time she was admitted to the hospital, had MRI brain which was normal, MRA head and neck did not show any significant stenosis and her EEG was within normal limit, no epileptiform discharges and no seizures.  However since then, patient does report additional seizure-like episodes at night but husband has not seen any of them.  I will repeat the routine EEG and I advised husband and patient to contact me if she has another seizure or  seizure-like activity.  For her reported memory problem described as being forgetful and not driving she did well on the Mini-Mental status exam 29 out of 30.  I explained to the patient that she might have mild cognitive impairment but as of now we will continue to monitor her.  She understands to return if worse otherwise she will follow-up with her primary care doctor.   1. Seizures ([place])   2. Family history of breast cancer      Patient Instructions  Continue current medications  Routine EEG  Return if worse   Orders Placed This Encounter  Procedures   EEG adult    No orders of the defined types were placed in this encounter.   Return if symptoms worsen or fail to improve.    Alric Ran, MD 11/11/2021, 6:12 PM  [facility] [address], [place], [place] [address] [phone]

## 2021-11-12 ENCOUNTER — Telehealth: Payer: Self-pay | Admitting: Nurse Practitioner

## 2021-11-12 NOTE — Telephone Encounter (Signed)
Changed upcoming appointment to in-person per 12/29 secure chat. Patient is aware of changes.

## 2021-11-15 NOTE — Progress Notes (Signed)
[place]   [phone] [phone]   Clinic Follow up Note   Patient Care Team: Griffin Basil, MD as PCP - General (Obstetrics and Gynecology) Elouise [name], MD as Consulting Physician (Cardiology) Mauro Kaufmann, RN as Oncology Nurse Navigator Rockwell Germany, RN as Oncology Nurse Navigator Rolm Bookbinder, MD as Consulting Physician (General Surgery) Truitt Merle, MD as Consulting Physician (Hematology) Kyung Rudd, MD as Consulting Physician (Radiation Oncology) Sheila Feeling, NP as Nurse Practitioner (Nurse Practitioner) 11/17/2021  CHIEF COMPLAINT: Follow up left breast cancer, tamoxifen med check  SUMMARY OF ONCOLOGIC HISTORY: Oncology History Overview Note  Cancer Staging Malignant neoplasm of upper-outer quadrant of left breast in female, estrogen receptor positive ([place]) Staging form: Breast, AJCC 8th Edition - Clinical stage from 04/03/2021: Stage IA (cT1c, cN0, cM0, G2, ER+, PR+, HER2-) - Signed by Truitt Merle, MD on 04/14/2021 Stage prefix: Initial diagnosis Histologic grading system: 3 grade system    Malignant neoplasm of upper-outer quadrant of left breast in female, estrogen receptor positive ([place])  03/31/2021 Mammogram   Targeted ultrasound is performed, demonstrating an irregular hypoechoic mass with angular margins at the 2 o'clock position approximately 5 cm nipple measuring approximately 1.4 x 0.8 x 1.0 cm, demonstrating posterior acoustic shadowing, associated with microcalcifications, corresponding to the screening mammographic finding.   Immediately adjacent to the dominant mass is a hypoechoic mass measuring 0.5 x 0.4 x 0.4 cm, demonstrating posterior acoustic enhancement and no internal power Doppler flow.   Sonographic evaluation of the LEFT axilla demonstrates no pathologic lymphadenopathy.   04/03/2021 Cancer Staging   Staging form: Breast, AJCC 8th Edition - Clinical stage from 04/03/2021: Stage IA  (cT1c, cN0, cM0, G2, ER+, PR+, HER2-) - Signed by Truitt Merle, MD on 04/14/2021 Stage prefix: Initial diagnosis Histologic grading system: 3 grade system    04/03/2021 Initial Biopsy   Diagnosis Breast, left, needle core biopsy, 2 o'clock - INVASIVE MAMMARY CARCINOMA - SEE COMMENT Microscopic Comment The biopsy material shows an infiltrative proliferation of cells with large vesicular nuclei with inconspicuous nucleoli, arranged linearly and in small clusters. Based on the biopsy, the carcinoma appears Nottingham grade 2 of 3 and measures 1.0 cm in greatest linear extent. E-cadherin and prognostic markers (ER/PR/ki-67/HER2)are pending and will be reported in an addendum. Dr. Jeannie Done reviewed the case and agrees with the above diagnosis. These results were called to [facility] on Apr 06, 2021.  E-cadherin is POSITIVE supporting a ductal origin.   04/03/2021 Receptors her2   PROGNOSTIC INDICATORS Results: IMMUNOHISTOCHEMICAL AND MORPHOMETRIC ANALYSIS PERFORMED MANUALLY The tumor cells are NEGATIVE for Her2 (1+). Estrogen Receptor: 70%, POSITIVE, STRONG STAINING INTENSITY Progesterone Receptor: >95%, POSITIVE, STRONG STAINING INTENSITY Proliferation Marker Ki67: 15%   04/05/2021 Imaging   CT AP  IMPRESSION: 1. No acute abnormality of the abdomen or pelvis. 2. Unchanged appearance of inferior right hepatic lobe lesion containing numerous calcifications.     04/08/2021 Initial Diagnosis   Malignant neoplasm of upper-outer quadrant of left breast in female, estrogen receptor positive ([place])   04/21/2021 Genetic Testing   Negative genetic testing:  No pathogenic variants detected on the [facility] BRCAplus panel (report date 04/21/2021) or CancerNext-Expanded + RNAinsight panel (report date 04/27/2021). 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   The BRCAplus panel offered by [employer] and includes sequencing and  deletion/duplication analysis for the following 8 genes: ATM, BRCA1, BRCA2, CDH1, CHEK2, PALB2, PTEN, and TP53.  The CancerNext-Expanded + RNAinsight gene panel offered by [employer] and includes sequencing and rearrangement analysis for the following 77 genes: AIP, ALK, APC, ATM, AXIN2, BAP1, BARD1, BLM, BMPR1A, BRCA1, BRCA2, BRIP1, CDC73, CDH1, CDK4, CDKN1B, CDKN2A, CHEK2, CTNNA1, DICER1, FANCC, FH, FLCN, GALNT12, KIF1B, LZTR1, MAX, MEN1, MET, MLH1, MSH2, MSH3, MSH6, MUTYH, NBN, NF1, NF2, NTHL1, PALB2, PHOX2B, PMS2, POT1, PRKAR1A, PTCH1, PTEN, RAD51C, RAD51D, RB1, RECQL, RET, SDHA, SDHAF2, SDHB, SDHC, SDHD, SMAD4, SMARCA4, SMARCB1, SMARCE1, STK11, SUFU, TMEM127, TP53, TSC1, TSC2, VHL and XRCC2 (sequencing and deletion/duplication); EGFR, EGLN1, HOXB13, KIT, MITF, PDGFRA, POLD1 and POLE (sequencing only); EPCAM and GREM1 (deletion/duplication only). RNA data is routinely analyzed for use in variant interpretation for all genes.   04/29/2021 Surgery   FINAL MICROSCOPIC DIAGNOSIS:   A. BREAST, LEFT, LUMPECTOMY:  - Invasive and in situ ductal carcinoma, 1.6 cm.  - Invasive carcinoma 0.1 cm from the lateral margin.  - Biopsy site and biopsy clip.  - See oncology table.   B. BREAST, LEFT POSTERIOR MARGIN, EXCISION:  - Fibrocystic changes with usual ductal hyperplasia.  - Final posterior margin negative for carcinoma.   C. BREAST, LEFT SUPERIOR MARGIN, EXCISION:  - Benign breast tissue.  - Final superior margin negative for carcinoma.   D. LYMPH NODE, LEFT AXILLARY, SENTINEL, EXCISION:  - One lymph node negative for metastatic carcinoma (0/1).   E. BREAST, LEFT MEDIAL MARGIN, EXCISION:  - Benign breast tissue.  - Final medial margin negative for carcinoma.    04/29/2021 Oncotype testing   Recurrence score of 19, distant recurrence risk of 6%, no benefit from chemotherapy   04/29/2021 Cancer Staging   Staging form: Breast, AJCC 8th Edition - Pathologic stage from 04/29/2021: Stage IA (pT1c,  pN0, cM0, G2, ER+, PR+, HER2-, Oncotype DX score: 19) - Signed by Sheila Feeling, NP on 09/30/2021 Stage prefix: Initial diagnosis Nuclear grade: G2 Multigene prognostic tests performed: Oncotype DX Recurrence score range: Greater than or equal to 11 Histologic grading system: 3 grade system    09/30/2021 Survivorship   SCP delivered by Cira Rue, NP     CURRENT THERAPY: Adjuvant Tamoxifen, starting 10/29/2021, stopped 11/03/2021 due to weakness and body pain  INTERVAL HISTORY: Sheila [name] returns for follow up as scheduled. She was last seen by me 09/30/21 in survivorship visit. She had ongoing seizure like activity but improved overtime and I recommended to start tamoxifen which she did on 10/29/21. By 11/03/21 she sent a message that she began experiencing whole left side body pain, weakness, fatigue, and headaches.  She had to pick her left leg up to move it.  She stopped tamoxifen after 5 days, symptoms have resolved except fatigue and headaches.  She cannot focus today, all she wants to do is sleep.  She does report left breast pain at the incision. She was seen by neuro 11/11/21 who ordered EEG scheduled later today.    MEDICAL HISTORY:  Past Medical History:  Diagnosis Date   Anemia    Anginal pain ([place])    Breast cancer ([place])    Family history of bone cancer    Family history of breast cancer    Family history of prostate cancer    Family history of throat cancer    Malignant neoplasm of upper-outer quadrant of left breast in female, estrogen receptor positive ([place]) 04/08/2021    SURGICAL HISTORY: Past Surgical History:  Procedure Laterality Date   AXILLARY LYMPH NODE BIOPSY Left 04/29/2021   Procedure: AXILLARY LYMPH NODE BIOPSY;  Surgeon:  Rolm Bookbinder, MD;  Location: [facility];  Service: General;  Laterality: Left;   BREAST LUMPECTOMY WITH RADIOACTIVE SEED AND SENTINEL LYMPH NODE BIOPSY Left 04/29/2021   Procedure: LEFT BREAST LUMPECTOMY WITH RADIOACTIVE SEED;   Surgeon: Rolm Bookbinder, MD;  Location: [facility];  Service: General;  Laterality: Left;   CARDIAC CATHETERIZATION     LEFT HEART CATHETERIZATION WITH CORONARY ANGIOGRAM N/A 05/30/2014   Procedure: LEFT HEART CATHETERIZATION WITH CORONARY ANGIOGRAM;  Surgeon: Peter M Martinique, MD;  [facility] C[facility] LAB;  Service: Cardiovascular;  Laterality: N/A;   TUBAL LIGATION      I have reviewed the social history and family history with the patient and they are unchanged from previous note.  ALLERGIES:  is allergic to anesthetics, amide and other.  MEDICATIONS:  Current Outpatient Medications  Medication Sig Dispense Refill   ibuprofen (ADVIL) 800 MG tablet Take 1 tablet (800 mg total) by mouth every 8 (eight) hours as needed for moderate pain. 21 tablet 0   methocarbamol (ROBAXIN) 500 MG tablet Take 1 tablet (500 mg total) by mouth every 6 (six) hours as needed for up to 30 doses for muscle spasms. 30 tablet 0   prochlorperazine (COMPAZINE) 10 MG tablet TAKE 1 TABLET(10 MG) BY MOUTH EVERY 6 HOURS AS NEEDED FOR NAUSEA OR VOMITING 30 tablet 0   triamcinolone cream (KENALOG) 0.1 % Apply topically 2 (two) times daily.     No current facility-administered medications for this visit.    PHYSICAL EXAMINATION: ECOG PERFORMANCE STATUS: 2 - Symptomatic, <50% confined to bed  Vitals:   11/17/21 1012  BP: 130/86  Pulse: 88  Resp: 18  Temp: (!) 97.3 F (36.3 C)  SpO2: 100%   Filed Weights   11/17/21 1012  Weight: 213 lb 12.8 oz (97 kg)    GENERAL:alert, no distress and comfortable SKIN: no rash  EYES: sclera clear LUNGS:  normal breathing effort HEART: no lower extremity edema NEURO: alert & oriented x 3 with fluent speech, otherwise nonfocal Breast exam: Breasts are symmetrical without nipple discharge or inversion.  S/p left lumpectomy and radiation.  Diffuse left breast hyperpigmentation and mild edema.  Incision completely healed with firmness, likely scar tissue versus seroma.  No palpable  mass in either breast or axilla that I could appreciate.  LABORATORY DATA:  I have reviewed the data as listed CBC Latest Ref Rng & Units 08/08/2021 08/07/2021 08/07/2021  WBC 4.0 - 10.5 K/uL 3.3(L) - 3.8(L)  Hemoglobin 12.0 - 15.0 g/dL 10.9(L) 11.9(L) 11.6(L)  Hematocrit 36.0 - 46.0 % 33.1(L) 35.0(L) 35.9(L)  Platelets 150 - 400 K/uL 225 - 179     CMP Latest Ref Rng & Units 08/09/2021 08/08/2021 08/07/2021  Glucose 70 - 99 mg/dL 85 90 -  BUN 6 - 20 mg/dL 7 <5(L) -  Creatinine 0.44 - 1.00 mg/dL 0.84 0.77 -  Sodium 135 - 145 mmol/L 143 141 144  Potassium 3.5 - 5.1 mmol/L 3.5 3.4(L) 3.9  Chloride 98 - 111 mmol/L 112(H) 112(H) -  CO2 22 - 32 mmol/L 23 22 -  Calcium 8.9 - 10.3 mg/dL 8.6(L) 8.3(L) -  Total Protein 6.5 - 8.1 g/dL - - -  Total Bilirubin 0.3 - 1.2 mg/dL - - -  Alkaline Phos 38 - 126 U/L - - -  AST 15 - 41 U/L - - -  ALT 0 - 44 U/L - - -      RADIOGRAPHIC STUDIES: I have personally reviewed the radiological images as listed and agreed with  the findings in the report. No results found.   ASSESSMENT & PLAN: AMARE KONTOS is a 51 y.o. female with    1. Malignant neoplasm of upper-outer quadrant of left breast, Stage IA, c(T1cN0M0), ER+/PR+/HER2-, Grade II  -Diagnosed 03/2021, she presented with two adjacent 1.4cm and 0.5cm mass in her left breast. 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  -S/p left lumpectomy on 04/29/21 under Dr. Donne Hazel. Pathology showed invasive and in situ ductal carcinoma, 1.6 cm. Final margins and biopsied lymph node were negative. -Oncotype RS of 19, distant recurrence risk of 6% with antiestrogen therapy, adjuvant chemo was not recommended -She completed adjuvant radiation per Dr. Lisbeth Renshaw -Given the strong ER and PR expression, adjuvant antiestrogen therapy was recommended, she took tamoxifen for 5 days before stopping due to left side body pain/weakness, fatigue, and headache. She is not interested in restarting  tamoxifen. -Ms. Parlett appears stable, awake but drowsy, somewhat distracted. She is not able to engage in discussion about anti-estrogen therapy today. Says since breast cancer diagnosis she has not felt like herself. -Breast exam shows post-lumpectomy and RT changes, seroma vs scar tissue at the incision, otherwise benign. -Further anti-estrogen therapy is pending hormone labs and DEXA  2.  Seizure x1 after colonoscopy, fatigue, headache, left-sided body pain, weakness -She has been dealing with these issues since cancer diagnosis, and developed left side leg pain and weakness after starting tamoxifen which she stopped after 5 days -She has neuro 11/11/21, seizure work up was overall negative including negative MRI, no evidence of brain metastasis. Repeat EEG scheduled later today -She is not able to focus today, just wants to sleep.  She may need other lab work-up per PCP.   -We will postpone further discussion regarding antiestrogen therapy to her next follow-up in February. -In the meantime we will obtain FSH, estradiol, and DEXA to see if she is a candidate for AI  3. Genetics  -Her mother was diagnosed with breast cancer less than 1 year ago. She also has multiple family members with breast cancer.  -She underwent testing on 04/15/21. Results were negative, with VUS in SDHB gene.   4. Perimenopause -Her last period was ~6 years ago (2016). She had an episode of bleeding following her surgery that lasted for 7 days, and she passed a lot of clots. -She underwent endometrium biopsy on 06/02/21, pathology benign. -FSH and estradiol are pending, to see if she is postmenopausal.   PLAN: -FSH/estradiol today -DEXA in next 1-4 weeks -EEG per neuro 1/3 as scheduled -F/up Dr. Burr Medico 2/17 (as previously scheduled) to review menopause labs, DEXA, and discuss anti-estrogen therapy -Refer to SW for gas card   Orders Placed This Encounter  Procedures   DG Bone Density    Standing Status:    Future    Standing Expiration Date:   11/17/2022    Order Specific Question:   Reason for Exam (SYMPTOM  OR DIAGNOSIS REQUIRED)    Answer:   left breast cancer, baseline for anti-estrogen therapy    Order Specific Question:   Is the patient pregnant?    Answer:   No    Order Specific Question:   Preferred imaging location?    Answer:   [facility]   Ambulatory referral to Social Work    Referral Priority:   Urgent    Referral Type:   Consultation    Referral Reason:   Specialty Services Required    Number of Visits Requested:  1   All questions were answered. The patient knows to call the clinic with any problems, questions or concerns. No barriers to learning were detected.      Sheila Feeling, NP 11/17/21

## 2021-11-17 ENCOUNTER — Ambulatory Visit (INDEPENDENT_AMBULATORY_CARE_PROVIDER_SITE_OTHER): Payer: Medicaid Other | Admitting: Neurology

## 2021-11-17 ENCOUNTER — Inpatient Hospital Stay: Payer: Medicaid Other | Attending: Hematology | Admitting: Nurse Practitioner

## 2021-11-17 ENCOUNTER — Encounter: Payer: Self-pay | Admitting: General Practice

## 2021-11-17 ENCOUNTER — Inpatient Hospital Stay: Payer: Medicaid Other

## 2021-11-17 ENCOUNTER — Encounter: Payer: Self-pay | Admitting: Nurse Practitioner

## 2021-11-17 ENCOUNTER — Other Ambulatory Visit: Payer: Self-pay

## 2021-11-17 VITALS — BP 130/86 | HR 88 | Temp 97.3°F | Resp 18 | Ht 69.0 in | Wt 213.8 lb

## 2021-11-17 DIAGNOSIS — Z780 Asymptomatic menopausal state: Secondary | ICD-10-CM | POA: Diagnosis not present

## 2021-11-17 DIAGNOSIS — Z923 Personal history of irradiation: Secondary | ICD-10-CM | POA: Diagnosis not present

## 2021-11-17 DIAGNOSIS — Z803 Family history of malignant neoplasm of breast: Secondary | ICD-10-CM | POA: Insufficient documentation

## 2021-11-17 DIAGNOSIS — R569 Unspecified convulsions: Secondary | ICD-10-CM | POA: Diagnosis not present

## 2021-11-17 DIAGNOSIS — C50412 Malignant neoplasm of upper-outer quadrant of left female breast: Secondary | ICD-10-CM | POA: Insufficient documentation

## 2021-11-17 DIAGNOSIS — Z170 Estrogen receptor positive status [ER+]: Secondary | ICD-10-CM

## 2021-11-17 NOTE — Progress Notes (Signed)
[facility] CSW Progress Notes  Call to patient per referral from NP - patient requests help w "gas card."  She is not eligible for [employer] as she is not in active treatment per financial advocate.  Spoke w patient - she has a seizure disorder which prevents her from driving.  Her daughter drives her to all appointments - she would like to help her daughter pay for gas.  CSW reviewed options available to her - her Medicaid does assist w transportation, she can request assistance for family member driver by calling [phone].  She will explore this benefit of her insurance. She is not interested in referral to [facility] or [employer] as she does not like to ride the bus, a [place], or use any kind of public transportation.  She will explore options available to her under her managed Medicaid.  Edwyna Shell, LCSW Clinical Social Worker Phone:  [phone]

## 2021-11-18 LAB — ESTRADIOL: Estradiol: 14.5 pg/mL

## 2021-11-18 LAB — FOLLICLE STIMULATING HORMONE: FSH: 50.4 m[IU]/mL

## 2021-11-18 NOTE — Procedures (Signed)
° ° °  History:  50 with seizure like activity after colonoscopy.   EEG classification:  Awake and asleep  Description of the recording: The background rhythms of this recording consists of a fairly well modulated medium amplitude background activity of 10 Hz. As the record progresses, the patient initially is in the waking state, but appears to enter the early stage II sleep during the recording, with rudimentary sleep spindles and vertex sharp wave activity seen. During the wakeful state, photic stimulation is performed, and no abnormal responses were seen. Hyperventilation was also performed, no abnormal response seen. No epileptiform discharges seen during this recording. There was no focal slowing. EKG monitor shows no evidence of cardiac rhythm abnormalities with a heart rate of 84.  Impression: This is a normal EEG recording in the waking and sleeping state. No evidence interictal epileptiform discharges were seen at any time during the recording.  A normal EEG does not exclude a diagnosis of epilepsy.    Alric Ran, MD [facility]

## 2021-12-15 ENCOUNTER — Other Ambulatory Visit: Payer: Self-pay

## 2021-12-15 ENCOUNTER — Ambulatory Visit
Admission: RE | Admit: 2021-12-15 | Discharge: 2021-12-15 | Disposition: A | Discharge status: Home / Self Care | Payer: Medicaid Other | Source: Ambulatory Visit | Attending: Nurse Practitioner | Admitting: Nurse Practitioner

## 2021-12-15 DIAGNOSIS — Z170 Estrogen receptor positive status [ER+]: Secondary | ICD-10-CM

## 2021-12-15 DIAGNOSIS — C50412 Malignant neoplasm of upper-outer quadrant of left female breast: Secondary | ICD-10-CM

## 2021-12-31 ENCOUNTER — Other Ambulatory Visit: Payer: Self-pay

## 2021-12-31 DIAGNOSIS — C50412 Malignant neoplasm of upper-outer quadrant of left female breast: Secondary | ICD-10-CM

## 2022-01-01 ENCOUNTER — Emergency Department (HOSPITAL_COMMUNITY)
Admission: EM | Admit: 2022-01-01 | Discharge: 2022-01-01 | Disposition: A | Discharge status: Home / Self Care | Payer: Medicaid Other | Attending: Emergency Medicine | Admitting: Emergency Medicine

## 2022-01-01 ENCOUNTER — Encounter (HOSPITAL_COMMUNITY): Payer: Self-pay

## 2022-01-01 ENCOUNTER — Inpatient Hospital Stay: Payer: Medicaid Other | Attending: Hematology | Admitting: Hematology

## 2022-01-01 ENCOUNTER — Inpatient Hospital Stay: Payer: Medicaid Other

## 2022-01-01 ENCOUNTER — Other Ambulatory Visit: Payer: Self-pay

## 2022-01-01 VITALS — BP 136/86 | HR 98 | Resp 17 | Wt 215.1 lb

## 2022-01-01 DIAGNOSIS — C50412 Malignant neoplasm of upper-outer quadrant of left female breast: Secondary | ICD-10-CM | POA: Diagnosis not present

## 2022-01-01 DIAGNOSIS — I10 Essential (primary) hypertension: Secondary | ICD-10-CM | POA: Insufficient documentation

## 2022-01-01 DIAGNOSIS — Z923 Personal history of irradiation: Secondary | ICD-10-CM | POA: Insufficient documentation

## 2022-01-01 DIAGNOSIS — Z853 Personal history of malignant neoplasm of breast: Secondary | ICD-10-CM | POA: Diagnosis not present

## 2022-01-01 DIAGNOSIS — Z780 Asymptomatic menopausal state: Secondary | ICD-10-CM | POA: Diagnosis not present

## 2022-01-01 DIAGNOSIS — Z803 Family history of malignant neoplasm of breast: Secondary | ICD-10-CM | POA: Insufficient documentation

## 2022-01-01 DIAGNOSIS — R519 Headache, unspecified: Secondary | ICD-10-CM | POA: Insufficient documentation

## 2022-01-01 DIAGNOSIS — Z170 Estrogen receptor positive status [ER+]: Secondary | ICD-10-CM | POA: Insufficient documentation

## 2022-01-01 DIAGNOSIS — Z5321 Procedure and treatment not carried out due to patient leaving prior to being seen by health care provider: Secondary | ICD-10-CM | POA: Diagnosis not present

## 2022-01-01 DIAGNOSIS — R55 Syncope and collapse: Secondary | ICD-10-CM | POA: Diagnosis not present

## 2022-01-01 LAB — CBC WITH DIFFERENTIAL/PLATELET
Abs Immature Granulocytes: 0.05 10*3/uL (ref 0.00–0.07)
Basophils Absolute: 0 10*3/uL (ref 0.0–0.1)
Basophils Relative: 1 %
Eosinophils Absolute: 0.2 10*3/uL (ref 0.0–0.5)
Eosinophils Relative: 4 %
HCT: 36.7 % (ref 36.0–46.0)
Hemoglobin: 12.3 g/dL (ref 12.0–15.0)
Immature Granulocytes: 1 %
Lymphocytes Relative: 25 %
Lymphs Abs: 1 10*3/uL (ref 0.7–4.0)
MCH: 29.4 pg (ref 26.0–34.0)
MCHC: 33.5 g/dL (ref 30.0–36.0)
MCV: 87.6 fL (ref 80.0–100.0)
Monocytes Absolute: 0.4 10*3/uL (ref 0.1–1.0)
Monocytes Relative: 11 %
Neutro Abs: 2.3 10*3/uL (ref 1.7–7.7)
Neutrophils Relative %: 58 %
Platelets: 245 10*3/uL (ref 150–400)
RBC: 4.19 MIL/uL (ref 3.87–5.11)
RDW: 12.9 % (ref 11.5–15.5)
WBC: 3.9 10*3/uL — ABNORMAL LOW (ref 4.0–10.5)
nRBC: 0 % (ref 0.0–0.2)

## 2022-01-01 LAB — BASIC METABOLIC PANEL
Anion gap: 8 (ref 5–15)
BUN: 7 mg/dL (ref 6–20)
CO2: 22 mmol/L (ref 22–32)
Calcium: 8.7 mg/dL — ABNORMAL LOW (ref 8.9–10.3)
Chloride: 110 mmol/L (ref 98–111)
Creatinine, Ser: 0.85 mg/dL (ref 0.44–1.00)
GFR, Estimated: >60 mL/min (ref >60)
Glucose, Bld: 104 mg/dL — ABNORMAL HIGH (ref 70–99)
Potassium: 3.9 mmol/L (ref 3.5–5.1)
Sodium: 140 mmol/L (ref 135–145)

## 2022-01-01 LAB — TROPONIN I (HIGH SENSITIVITY): Troponin I (High Sensitivity): 5 ng/L (ref <18)

## 2022-01-01 MED ORDER — METOCLOPRAMIDE HCL 5 MG/ML IJ SOLN
10.0000 mg | INTRAMUSCULAR | Status: AC
  Administered 2022-01-01: 10 mg via INTRAMUSCULAR
  Filled 2022-01-01: qty 2

## 2022-01-01 MED ORDER — IBUPROFEN 800 MG PO TABS: 800.0000 mg | ORAL_TABLET | Freq: Three times a day (TID) | ORAL | 0 refills | Status: DC | PRN

## 2022-01-01 NOTE — Progress Notes (Signed)
This nurse completed orthostatic blood pressure on this patient Sitting-128/84, Standing-139/84, Lying-136/86.  No complaints of dizziness or feeling light headed.    This nurse also assessed oxygen saturations while walking for 5 minutes.  Patient maintained sats 97%-98%  No complaints noted at this time.  MD made aware.

## 2022-01-01 NOTE — ED Notes (Signed)
Pt left emergency department voluntarily

## 2022-01-01 NOTE — ED Provider Triage Note (Signed)
Emergency Medicine Provider Triage Evaluation Note  Sheila [name] , a 51 y.o. female  was evaluated in triage.  Pt with past medical history of breast cancer (treated with radiation), hypertension, seizure like activity complains of near syncope and headache. Symptoms began tonight. Headache is behind her eyes, radiating to the top of her head and her occiput. No medications taken PTA for symptoms. Has had similar headaches associated with seizure like episodes. Admitted for further w/u of this in September with reassuring MRIs, normal EEG. EEG repeated in January and was normal as well. Has seen [facility] for these complaints in the past.  Review of Systems  Positive: As above Negative: As above  Physical Exam  BP (!) 153/94 (BP Location: Right Arm)    Pulse 85    Temp 98.8 F (37.1 C) (Oral)    Resp 19    Ht 5\' 9"  (1.753 m)    Wt 95.3 kg    LMP 04/03/2018 (Within Days)    SpO2 98%    BMI 31.01 kg/m  Gen:   Awake, no distress   Resp:  Normal effort  MSK:   Moves extremities without difficulty  Other:  GCS 15. Speech is goal oriented. No cranial nerve deficits appreciated; symmetric eyebrow raise, no facial drooping, tongue midline. Patient has equal grip strength bilaterally with 5/5 strength against resistance in all major muscle groups bilaterally. Sensation to light touch intact. Patient moves extremities without ataxia.   Medical Decision Making  Medically screening exam initiated at 5:25 AM.  Appropriate orders placed.  [name] was informed that the remainder of the evaluation will be completed by another provider, this initial triage assessment does not replace that evaluation, and the importance of remaining in the ED until their evaluation is complete.  Frontal headache c/w prior migraines. Reassuring MRI in September without focal deficits on exam. Reglan ordered for symptom control pending formal bed placement in the department.   Antonietta Breach, [name] 01/01/22 [id number]

## 2022-01-01 NOTE — Progress Notes (Signed)
[facility]   [phone] [phone]   Clinic Follow up Note   Patient Care Team: Griffin Basil, MD as PCP - General (Obstetrics and Gynecology) Elouise [name], MD as Consulting Physician (Cardiology) Mauro Kaufmann, RN as Oncology Nurse Navigator Rockwell Germany, RN as Oncology Nurse Navigator Rolm Bookbinder, MD as Consulting Physician (General Surgery) Truitt Merle, MD as Consulting Physician (Hematology) Kyung Rudd, MD as Consulting Physician (Radiation Oncology) Alla Feeling, NP as Nurse Practitioner (Nurse Practitioner)  Date of Service: 01/01/2022  CHIEF COMPLAINT: f/u of left breast cancer  CURRENT THERAPY:  Surveillance  ASSESSMENT & PLAN:  Sheila [name] is a 51 y.o. female with   1. Malignant neoplasm of upper-outer quadrant of left breast, Stage IA, p(T1cN0M0), ER+/PR+/HER2-, Grade II  -mass palpated by GYN in 02/2021. Biopsy on 04/03/21 showed IDC. -S/p left lumpectomy on 04/29/21 under Dr. Donne Hazel. Pathology showed invasive and in situ ductal carcinoma, 1.6 cm. Final margins and biopsied lymph node were negative. -Oncotype RS of 19, low risk, adjuvant radiation is not recommended. -She completed adjuvant radiation per Dr. Lisbeth Renshaw 7/21-8/19/22 -she took tamoxifen for 5 days, then stopped due to left side body pain/weakness, fatigue, and headache. She is not interested in restarting tamoxifen. -given her continued concerns with seizure activity, we will continue to hold antiestrogen therapy. -labs obtained in the ED earlier today reviewed, no concerns. Physical exam showed scar tissue to the incision. She endorses continuing massages at home. From a breast cancer standpoint, she is doing well. However, she continues to feel weak and reports shaking with loss of consciousness. I advised her to return to her neurologist.   2.  Seizure and seizure-like activity, fatigue, headache, left-sided body pain, weakness, memory  issues, SOB -She has had instances of headache and SOB over a number of years, though she feels it started within the last year. She does not recall prior ED visits or scans for these. -seizure activity initially started after colonoscopy on 08/07/22. She saw neuro 11/11/21, seizure work up was overall negative including negative MRI, no evidence of brain metastasis. EEG normal.  -she is currently not taking any pain medication for her headaches. I prescribed ibuprofen for her.  3. Genetics  -Her mother was diagnosed with breast cancer ~2021. She also has multiple family members with breast cancer.  -She underwent testing on 04/15/21. Results were negative, with VUS in SDHB gene.   4. Postmenopause, bone health  -Her last period was around age 72. She had an episode of bleeding following her surgery that lasted for 7 days, and she passed a lot of clots. -She underwent endometrium biopsy on 06/02/21, pathology benign. -[place] and estradiol from 11/17/21 show she is postmenopausal. -DEXA on 12/15/21 was normal (T-score 0.0)   5.  Dizziness, headaches, chest pain and dyspnea  -intermittent, exam unremarkable today, no orthostatic or hypotension -Oxygen remains normal when she walks, no high concern for PE or metastatic cancer -She had recurrent chest pain, dyspnea, and had multiple ED visit in the past -I recommend her follow-up with her PCP and neurologist.   PLAN: -I called in ibuprofen -lab and f/u in 4 months to see if she is ready to start AI  -will message her neurologist Dr. April Manson for her follow up    No problem-specific Assessment & Plan notes found for this encounter.   SUMMARY OF ONCOLOGIC HISTORY: Oncology History Overview Note  Cancer Staging Malignant neoplasm of upper-outer quadrant of left breast in  female, estrogen receptor positive ([place]) Staging form: Breast, AJCC 8th Edition - Clinical stage from 04/03/2021: Stage IA (cT1c, cN0, cM0, G2, ER+, PR+, HER2-) - Signed by Truitt Merle,  MD on 04/14/2021 Stage prefix: Initial diagnosis Histologic grading system: 3 grade system    Malignant neoplasm of upper-outer quadrant of left breast in female, estrogen receptor positive ([place])  03/31/2021 Mammogram   Targeted ultrasound is performed, demonstrating an irregular hypoechoic mass with angular margins at the 2 o'clock position approximately 5 cm nipple measuring approximately 1.4 x 0.8 x 1.0 cm, demonstrating posterior acoustic shadowing, associated with microcalcifications, corresponding to the screening mammographic finding.   Immediately adjacent to the dominant mass is a hypoechoic mass measuring 0.5 x 0.4 x 0.4 cm, demonstrating posterior acoustic enhancement and no internal power Doppler flow.   Sonographic evaluation of the LEFT axilla demonstrates no pathologic lymphadenopathy.   04/03/2021 Cancer Staging   Staging form: Breast, AJCC 8th Edition - Clinical stage from 04/03/2021: Stage IA (cT1c, cN0, cM0, G2, ER+, PR+, HER2-) - Signed by Truitt Merle, MD on 04/14/2021 Stage prefix: Initial diagnosis Histologic grading system: 3 grade system    04/03/2021 Initial Biopsy   Diagnosis Breast, left, needle core biopsy, 2 o'clock - INVASIVE MAMMARY CARCINOMA - SEE COMMENT Microscopic Comment The biopsy material shows an infiltrative proliferation of cells with large vesicular nuclei with inconspicuous nucleoli, arranged linearly and in small clusters. Based on the biopsy, the carcinoma appears Nottingham grade 2 of 3 and measures 1.0 cm in greatest linear extent. E-cadherin and prognostic markers (ER/PR/ki-67/HER2)are pending and will be reported in an addendum. Dr. Jeannie Done reviewed the case and agrees with the above diagnosis. These results were called to [facility] on Apr 06, 2021.  E-cadherin is POSITIVE supporting a ductal origin.   04/03/2021 Receptors her2   PROGNOSTIC INDICATORS Results: IMMUNOHISTOCHEMICAL AND MORPHOMETRIC ANALYSIS  PERFORMED MANUALLY The tumor cells are NEGATIVE for Her2 (1+). Estrogen Receptor: 70%, POSITIVE, STRONG STAINING INTENSITY Progesterone Receptor: >95%, POSITIVE, STRONG STAINING INTENSITY Proliferation Marker Ki67: 15%   04/05/2021 Imaging   CT AP  IMPRESSION: 1. No acute abnormality of the abdomen or pelvis. 2. Unchanged appearance of inferior right hepatic lobe lesion containing numerous calcifications.     04/08/2021 Initial Diagnosis   Malignant neoplasm of upper-outer quadrant of left breast in female, estrogen receptor positive ([place])   04/21/2021 Genetic Testing   Negative genetic testing:  No pathogenic variants detected on the [facility] BRCAplus panel (report date 04/21/2021) or CancerNext-Expanded + RNAinsight panel (report date 04/27/2021). 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   The BRCAplus panel offered by [employer] and includes sequencing and deletion/duplication analysis for the following 8 genes: ATM, BRCA1, BRCA2, CDH1, CHEK2, PALB2, PTEN, and TP53. The CancerNext-Expanded + RNAinsight gene panel offered by [employer] and includes sequencing and rearrangement analysis for the following 77 genes: AIP, ALK, APC, ATM, AXIN2, BAP1, BARD1, BLM, BMPR1A, BRCA1, BRCA2, BRIP1, CDC73, CDH1, CDK4, CDKN1B, CDKN2A, CHEK2, CTNNA1, DICER1, FANCC, FH, FLCN, GALNT12, KIF1B, LZTR1, MAX, MEN1, MET, MLH1, MSH2, MSH3, MSH6, MUTYH, NBN, NF1, NF2, NTHL1, PALB2, PHOX2B, PMS2, POT1, PRKAR1A, PTCH1, PTEN, RAD51C, RAD51D, RB1, RECQL, RET, SDHA, SDHAF2, SDHB, SDHC, SDHD, SMAD4, SMARCA4, SMARCB1, SMARCE1, STK11, SUFU, TMEM127, TP53, TSC1, TSC2, VHL and XRCC2 (sequencing and deletion/duplication); EGFR, EGLN1, HOXB13, KIT, MITF, PDGFRA, POLD1 and POLE (sequencing only); EPCAM and GREM1 (deletion/duplication only). RNA data is routinely analyzed for use in variant interpretation for all genes.  04/29/2021 Surgery   FINAL MICROSCOPIC DIAGNOSIS:   A.  BREAST, LEFT, LUMPECTOMY:  - Invasive and in situ ductal carcinoma, 1.6 cm.  - Invasive carcinoma 0.1 cm from the lateral margin.  - Biopsy site and biopsy clip.  - See oncology table.   B. BREAST, LEFT POSTERIOR MARGIN, EXCISION:  - Fibrocystic changes with usual ductal hyperplasia.  - Final posterior margin negative for carcinoma.   C. BREAST, LEFT SUPERIOR MARGIN, EXCISION:  - Benign breast tissue.  - Final superior margin negative for carcinoma.   D. LYMPH NODE, LEFT AXILLARY, SENTINEL, EXCISION:  - One lymph node negative for metastatic carcinoma (0/1).   E. BREAST, LEFT MEDIAL MARGIN, EXCISION:  - Benign breast tissue.  - Final medial margin negative for carcinoma.    04/29/2021 Oncotype testing   Recurrence score of 19, distant recurrence risk of 6%, no benefit from chemotherapy   04/29/2021 Cancer Staging   Staging form: Breast, AJCC 8th Edition - Pathologic stage from 04/29/2021: Stage IA (pT1c, pN0, cM0, G2, ER+, PR+, HER2-, Oncotype DX score: 19) - Signed by Alla Feeling, NP on 09/30/2021 Stage prefix: Initial diagnosis Nuclear grade: G2 Multigene prognostic tests performed: Oncotype DX Recurrence score range: Greater than or equal to 11 Histologic grading system: 3 grade system    09/30/2021 Survivorship   SCP delivered by Cira Rue, NP      INTERVAL HISTORY:  Sheila [name] is here for a follow up of breast cancer. She was last seen by NP Lacie on 11/17/21. She presents to the clinic alone. She reports she is having stomach pain and headaches. Per her report, she was told she has metal in her stomach during her DEXA. She is not taking anything for headaches due to fear of medication. She reports she has been feeling badly with seizure activity since her colonoscopy on 08/07/21. She was evaluated by neurology in 10/2021, and she is under surveillance.  Upon chart review, these issues have been going on longer than she states, but she cannot remember any  of her prior ED visits.   All other systems were reviewed with the patient and are negative.  MEDICAL HISTORY:  Past Medical History:  Diagnosis Date   Anemia    Anginal pain ([place])    Breast cancer ([place])    Family history of bone cancer    Family history of breast cancer    Family history of prostate cancer    Family history of throat cancer    Malignant neoplasm of upper-outer quadrant of left breast in female, estrogen receptor positive ([place]) 04/08/2021    SURGICAL HISTORY: Past Surgical History:  Procedure Laterality Date   AXILLARY LYMPH NODE BIOPSY Left 04/29/2021   Procedure: AXILLARY LYMPH NODE BIOPSY;  Surgeon: Rolm Bookbinder, MD;  Location: [facility];  Service: General;  Laterality: Left;   BREAST LUMPECTOMY WITH RADIOACTIVE SEED AND SENTINEL LYMPH NODE BIOPSY Left 04/29/2021   Procedure: LEFT BREAST LUMPECTOMY WITH RADIOACTIVE SEED;  Surgeon: Rolm Bookbinder, MD;  Location: [facility];  Service: General;  Laterality: Left;   CARDIAC CATHETERIZATION     LEFT HEART CATHETERIZATION WITH CORONARY ANGIOGRAM N/A 05/30/2014   Procedure: LEFT HEART CATHETERIZATION WITH CORONARY ANGIOGRAM;  Surgeon: Peter M Martinique, MD;  Location: [facility] CATH LAB;  Service: Cardiovascular;  Laterality: N/A;   TUBAL LIGATION      I have reviewed the social history and family history with the patient and they are unchanged from previous note.  ALLERGIES:  is allergic to  anesthetics, amide and other.  MEDICATIONS:  Current Outpatient Medications  Medication Sig Dispense Refill   ibuprofen (ADVIL) 800 MG tablet Take 1 tablet (800 mg total) by mouth every 8 (eight) hours as needed for moderate pain. 21 tablet 0   methocarbamol (ROBAXIN) 500 MG tablet Take 1 tablet (500 mg total) by mouth every 6 (six) hours as needed for up to 30 doses for muscle spasms. 30 tablet 0   prochlorperazine (COMPAZINE) 10 MG tablet TAKE 1 TABLET(10 MG) BY MOUTH EVERY 6 HOURS AS NEEDED FOR NAUSEA OR VOMITING 30 tablet 0    triamcinolone cream (KENALOG) 0.1 % Apply topically 2 (two) times daily.     No current facility-administered medications for this visit.    PHYSICAL EXAMINATION: ECOG PERFORMANCE STATUS: 2 - Symptomatic, <50% confined to bed  Vitals:   01/01/22 1645 01/01/22 1646  BP: 139/84 136/86  Pulse:    Resp:    SpO2:     Wt Readings from Last 3 Encounters:  01/01/22 210 lb (95.3 kg)  01/01/22 215 lb 1 oz (97.6 kg)  11/17/21 213 lb 12.8 oz (97 kg)     GENERAL:alert, no distress and comfortable SKIN: skin color, texture, turgor are normal, no rashes or significant lesions, (+) keloids present across upper chest EYES: normal, Conjunctiva are pink and non-injected, sclera clear  NECK: supple, thyroid normal size, non-tender, without nodularity LYMPH:  no palpable lymphadenopathy in the cervical, axillary  LUNGS: clear to auscultation and percussion with normal breathing effort HEART: regular rate & rhythm and no murmurs and no lower extremity edema ABDOMEN:abdomen soft, (+) tender to palpation in lower abdomen/pelvis NEURO: alert & oriented x 3 with fluent speech, no focal motor/sensory deficits BREAST: (+) palpable scar tissue present at incision site. No palpable mass or adenopathy bilaterally. Breast exam benign.   LABORATORY DATA:  I have reviewed the data as listed CBC Latest Ref Rng & Units 01/01/2022 08/08/2021 08/07/2021  WBC 4.0 - 10.5 K/uL 3.9(L) 3.3(L) -  Hemoglobin 12.0 - 15.0 g/dL 12.3 10.9(L) 11.9(L)  Hematocrit 36.0 - 46.0 % 36.7 33.1(L) 35.0(L)  Platelets 150 - 400 K/uL 245 225 -     CMP Latest Ref Rng & Units 01/01/2022 08/09/2021 08/08/2021  Glucose 70 - 99 mg/dL 104(H) 85 90  BUN 6 - 20 mg/dL 7 7 <5(L)  Creatinine 0.44 - 1.00 mg/dL 0.85 0.84 0.77  Sodium 135 - 145 mmol/L 140 143 141  Potassium 3.5 - 5.1 mmol/L 3.9 3.5 3.4(L)  Chloride 98 - 111 mmol/L 110 112(H) 112(H)  CO2 22 - 32 mmol/L _0 Calcium 8.9 - 10.3 mg/dL 8.7(L) 8.6(L) 8.3(L)  Total Protein 6.5 -  8.1 g/dL - - -  Total Bilirubin 0.3 - 1.2 mg/dL - - -  Alkaline Phos 38 - 126 U/L - - -  AST 15 - 41 U/L - - -  ALT 0 - 44 U/L - - -      RADIOGRAPHIC STUDIES: I have personally reviewed the radiological images as listed and agreed with the findings in the report. No results found.    No orders of the defined types were placed in this encounter.  All questions were answered. The patient knows to call the clinic with any problems, questions or concerns. No barriers to learning was detected. The total time spent in the appointment was 30 minutes.     Truitt Merle, MD 01/01/2022  I, Wilburn Mylar, am acting as scribe for Truitt Merle, MD.   I  have reviewed the above documentation for accuracy and completeness, and I agree with the above.

## 2022-01-01 NOTE — ED Triage Notes (Signed)
Pt BIB [facility] from home c/o CP and a headache x a couple months since having a colonoscopy done. Pt states she has not felt right since. Pt also had a near syncopal episode this evening.    202/140 84 97 16

## 2022-01-02 ENCOUNTER — Encounter: Payer: Self-pay | Admitting: Hematology

## 2022-01-06 ENCOUNTER — Telehealth: Payer: Self-pay

## 2022-01-06 NOTE — Telephone Encounter (Signed)
Faxed Dr. Ernestina Penna last office note to Dr. Alric Ran w/[facility] [phone]  F [phone]) and to Dr. Lynnda Shields (PCP) w/[facility] [phone] F [phone]) on 01/06/2022.  Fax confirmation confirmed for both faxes.

## 2022-01-11 ENCOUNTER — Ambulatory Visit (INDEPENDENT_AMBULATORY_CARE_PROVIDER_SITE_OTHER): Payer: Medicaid Other | Admitting: Neurology

## 2022-01-11 ENCOUNTER — Encounter: Payer: Self-pay | Admitting: Neurology

## 2022-01-11 VITALS — BP 131/84 | HR 84 | Ht 69.0 in | Wt 215.0 lb

## 2022-01-11 DIAGNOSIS — G44221 Chronic tension-type headache, intractable: Secondary | ICD-10-CM

## 2022-01-11 MED ORDER — SUMATRIPTAN SUCCINATE 50 MG PO TABS: 50.0000 mg | ORAL_TABLET | ORAL | 6 refills | Status: DC | PRN

## 2022-01-11 MED ORDER — AMITRIPTYLINE HCL 25 MG PO TABS: 25.0000 mg | ORAL_TABLET | Freq: Every day | ORAL | 6 refills | Status: DC

## 2022-01-11 MED ORDER — METHYLPREDNISOLONE 4 MG PO TBPK: ORAL_TABLET | ORAL | 0 refills | Status: DC

## 2022-01-11 NOTE — Patient Instructions (Addendum)
Start steroid as below:  Start with 6 tabs on day 1 Then 5 tabs on day 2 Then 4 tabs on day 3 Then 3 tabs on day 4 Then 2 tabs on day 5 Then 1 tab on day 6.  After the steroid Dosepak, start with amitriptyline 25 mg nightly.  Start with half a tab nightly for 2 weeks then increase to full tab thereafter. Use sumatriptan as needed for severe headaches, take 1 tab as soon as the headache start, you can take a second tab after 2 hours if headache is not resolved. Follow-up in 6 months.

## 2022-01-11 NOTE — Progress Notes (Signed)
[facility]  PATIENT: Sheila [name] DOB: 04/17/1971  REQUESTING CLINICIAN: Griffin Basil, MD HISTORY FROM: Patient  REASON FOR VISIT: Seizure after colonoscopy.    HISTORICAL  CHIEF COMPLAINT:  Chief Complaint  Patient presents with   Follow-up    Rm 13. Alone. Pt had a seizure last week, here to f/u. Pt c/o current headache behind right ear.     INTERVAL HISTORY 01/11/22:  Patient presents today for follow-up for seizures and headaches.  Additional history provided by daughter over the phone.  For the headaches, patient reports having bad headaches, straight for the past 2 weeks, non stop, headaches described as diffuse headaches, sometimes she feels it in the back of her neck, nothing seems to help with the headaches.  She has tried ibuprofen, methocarbamol and Tylenol without much relief.  She states now, she has to go in a dark room.  She presented to the ED on the 17th for the headaches. She still complains of seizure-like activity.  Daughter witnessed the event on the 17 and stated patient was complaining of headaches then hold her head and start shaking her head like to say no and also both upper extremities was shaking.  There was no reported postictal confusion.  And no injury. Patient also has been reported increased stress, her son attempted suicide and currently she is in the mental health facility but she is claiming that she cannot handle the stress.     HISTORY OF PRESENT ILLNESS:  This is a 51 year old woman with past medical history of breast cancer which was treated with radiation, hypertension who is presenting after a single seizures status post colonoscopy on September 23.  Patient reports after colonoscopy she had a seizure, does not remember the event, reported waking up 3 days later.  She was not started on any antiseizure medication.  Since being discharged from home patient report additional seizures during her sleep, she does not  remember but she reports incident of tongue biting and urinary incontinence and when she wakes up in the morning her body "will really bad.  I spoke with the husband over the phone who denies any seizure activity at night, stated that he does not he did not witness any of the events.   Patient also reported her memory is really bad, described as being forgetful, she has difficulty with name, misplacing items, she is currently not driving because she does report instance of getting lost while driving.  She did forget to pay her bills, now her daughter is handling her bills.  She does not have issue with forgetting family members name.   Patient reports that currently she is under a lot of stress mainly from her son.  She believes that her son is in danger due to gang members trying to recruit him.   OTHER MEDICAL CONDITIONS: History of breast cancer treated with radiation, HTN   REVIEW OF SYSTEMS: Full 14 system review of systems performed and negative with exception of: as noted in the HPI  ALLERGIES: Allergies  Allergen Reactions   Anesthetics, Amide Other (See Comments)    seizures   Other Other (See Comments)    General Anesthesia caused seizures-Propofol    HOME MEDICATIONS: Outpatient Medications Prior to Visit  Medication Sig Dispense Refill   ibuprofen (ADVIL) 800 MG tablet Take 1 tablet (800 mg total) by mouth every 8 (eight) hours as needed for moderate pain. 21 tablet 0   methocarbamol (ROBAXIN) 500 MG tablet Take 1  tablet (500 mg total) by mouth every 6 (six) hours as needed for up to 30 doses for muscle spasms. 30 tablet 0   prochlorperazine (COMPAZINE) 10 MG tablet TAKE 1 TABLET(10 MG) BY MOUTH EVERY 6 HOURS AS NEEDED FOR NAUSEA OR VOMITING 30 tablet 0   triamcinolone cream (KENALOG) 0.1 % Apply topically 2 (two) times daily.     No facility-administered medications prior to visit.    PAST MEDICAL HISTORY: Past Medical History:  Diagnosis Date   Anemia    Anginal pain  ([place])    Breast cancer ([place])    Family history of bone cancer    Family history of breast cancer    Family history of prostate cancer    Family history of throat cancer    Malignant neoplasm of upper-outer quadrant of left breast in female, estrogen receptor positive ([place]) 04/08/2021    PAST SURGICAL HISTORY: Past Surgical History:  Procedure Laterality Date   AXILLARY LYMPH NODE BIOPSY Left 04/29/2021   Procedure: AXILLARY LYMPH NODE BIOPSY;  Surgeon: Rolm Bookbinder, MD;  Location: [facility];  Service: General;  Laterality: Left;   BREAST LUMPECTOMY WITH RADIOACTIVE SEED AND SENTINEL LYMPH NODE BIOPSY Left 04/29/2021   Procedure: LEFT BREAST LUMPECTOMY WITH RADIOACTIVE SEED;  Surgeon: Rolm Bookbinder, MD;  Location: [facility];  Service: General;  Laterality: Left;   CARDIAC CATHETERIZATION     LEFT HEART CATHETERIZATION WITH CORONARY ANGIOGRAM N/A 05/30/2014   Procedure: LEFT HEART CATHETERIZATION WITH CORONARY ANGIOGRAM;  Surgeon: Peter M Martinique, MD;  Location: [facility] CATH LAB;  Service: Cardiovascular;  Laterality: N/A;   TUBAL LIGATION      FAMILY HISTORY: Family History  Problem Relation Age of Onset   Heart disease Mother    Hypertension Mother    Heart Problems Mother    Hyperlipidemia Mother    Breast cancer Mother 14   Diabetes Father    Seizures Father    Heart disease Sister    Crohn's disease Sister    Crohn's disease Brother    Hypertension Maternal Aunt    Colon cancer Maternal Uncle    Hypertension Maternal Uncle    Prostate cancer Maternal Uncle    Prostate cancer Maternal Uncle    Cancer Paternal Aunt        unknown type   Cancer Paternal Uncle        unknown type   Cancer Paternal Uncle        unknown type   Throat cancer Paternal Uncle    Hypertension Maternal Grandmother    Stroke Maternal Grandmother    Breast cancer Maternal Grandmother 2   Stroke Maternal Grandfather    Pancreatic cancer Cousin    Breast cancer Cousin        dx 41s, bilateral  mastectomies (maternal first cousin)   Bone cancer Nephew 11       knee, great-nephew   Breast cancer Other        great-aunt (MGF's sister)   Breast cancer Other        first cousin once removed (MGF's niece)   Breast cancer Other        first cousin once removed (MGF's niece)   Cancer Other        unknown type, great-uncle (MGM's brother)   Breast cancer Other        first cousin once removed (MGM's niece)   Breast cancer Other        first cousin once removed (MGM's niece)   Prostate  cancer Other        first cousin once removed (MGM's nephew)   Breast cancer Other        second cousin (MGM's great-niece)   Cancer Other        unknown type, dx 36s (maternal aunt's granddaughter)   Sickle cell anemia Other    Esophageal cancer Neg Hx    Stomach cancer Neg Hx    Rectal cancer Neg Hx     SOCIAL HISTORY: Social History   Socioeconomic History   Marital status: Married    Spouse name: Not on file   Number of children: 4   Years of education: Not on file   Highest education level: Not on file  Occupational History   Occupation: Medical laboratory scientific officer: [employer]  Tobacco Use   Smoking status: Never   Smokeless tobacco: Never  Vaping Use   Vaping Use: Never used  Substance and Sexual Activity   Alcohol use: No   Drug use: No   Sexual activity: Yes    Birth control/protection: Surgical  Other Topics Concern   Not on file  Social History Narrative   Not on file   Social Determinants of Health   Financial Resource Strain: Not on file  Food Insecurity: No Food Insecurity   Worried About Running Out of Food in the Last Year: Never true   [place] in the Last Year: Never true  Transportation Needs: No Transportation Needs   Lack of Transportation (Medical): No   Lack of Transportation (Non-Medical): No  Physical Activity: Not on file  Stress: Not on file  Social Connections: Not on file  Intimate Partner Violence: Not on file    PHYSICAL  EXAM  GENERAL EXAM/CONSTITUTIONAL: Vitals:  Vitals:   01/11/22 1438  BP: 131/84  Pulse: 84  Weight: 215 lb (97.5 kg)  Height: 5\' 9"  (1.753 m)    Body mass index is 31.75 kg/m. Wt Readings from Last 3 Encounters:  01/11/22 215 lb (97.5 kg)  01/01/22 210 lb (95.3 kg)  01/01/22 215 lb 1 oz (97.6 kg)   Patient is in no distress; well developed, nourished and groomed; neck is supple  CARDIOVASCULAR: Examination of carotid arteries is normal; no carotid bruits Regular rate and rhythm, no murmurs Examination of peripheral vascular system by observation and palpation is normal  EYES: Pupils round and reactive to light, Visual fields full to confrontation, Extraocular movements intacts,   MUSCULOSKELETAL: Gait, strength, tone, movements noted in Neurologic exam below  NEUROLOGIC: MENTAL STATUS:  MMSE - [place] Exam 11/11/2021  Orientation to time 5  Orientation to Place 5  Registration 3  Attention/ Calculation 4  Recall 3  Language- name 2 objects 2  Language- repeat 1  Language- follow 3 step command 3  Language- read & follow direction 1  Write a sentence 1  Copy design 1  Total score 29   awake, alert, oriented to person, place and time recent and remote memory intact normal attention and concentration language fluent, comprehension intact, naming intact fund of knowledge appropriate  CRANIAL NERVE: 2nd, 3rd, 4th, 6th - pupils equal and reactive to light, visual fields full to confrontation, extraocular muscles intact, no nystagmus 5th - facial sensation symmetric 7th - facial strength symmetric 8th - hearing intact 9th - palate elevates symmetrically, uvula midline 11th - shoulder shrug symmetric 12th - tongue protrusion midline  MOTOR:  normal bulk and tone, full strength in the BUE,  BLE  SENSORY:  normal and symmetric to light touch, pinprick, temperature, vibration  COORDINATION:  finger-nose-finger, fine finger movements  normal  REFLEXES:  deep tendon reflexes present and symmetric  GAIT/STATION:  normal     DIAGNOSTIC DATA (LABS, IMAGING, TESTING) - I reviewed patient records, labs, notes, testing and imaging myself where available.  Lab Results  Component Value Date   WBC 3.9 (L) 01/01/2022   HGB 12.3 01/01/2022   HCT 36.7 01/01/2022   MCV 87.6 01/01/2022   PLT 245 01/01/2022      Component Value Date/Time   NA 140 01/01/2022 0530   NA 143 06/30/2020 1553   K 3.9 01/01/2022 0530   CL 110 01/01/2022 0530   CO2 22 01/01/2022 0530   GLUCOSE 104 (H) 01/01/2022 0530   BUN 7 01/01/2022 0530   BUN 8 06/30/2020 1553   CREATININE 0.85 01/01/2022 0530   CREATININE 0.95 04/15/2021 0834   CALCIUM 8.7 (L) 01/01/2022 0530   PROT 6.2 (L) 08/07/2021 1913   ALBUMIN 3.3 (L) 08/07/2021 1913   AST 28 08/07/2021 1913   AST 19 04/15/2021 0834   ALT 18 08/07/2021 1913   ALT 14 04/15/2021 0834   ALKPHOS 53 08/07/2021 1913   BILITOT 1.8 (H) 08/07/2021 1913   BILITOT 1.0 04/15/2021 0834   GFRNONAA >60 01/01/2022 0530   GFRNONAA >60 04/15/2021 0834   GFRAA 82 06/30/2020 1553   Lab Results  Component Value Date   CHOL 213 (H) 06/30/2020   HDL 55 06/30/2020   LDLCALC 141 (H) 06/30/2020   TRIG 93 06/30/2020   CHOLHDL 3.9 06/30/2020   Lab Results  Component Value Date   HGBA1C 5.3 06/30/2020   Lab Results  Component Value Date   VITAMINB12 179 (L) 05/19/2021   Lab Results  Component Value Date   TSH 1.210 05/30/2014    MRI Brain  No evidence of acute intracranial abnormality.  No evidence of intracranial metastatic disease. Minimal multifocal T2 FLAIR hyperintense signal abnormality within the cerebral white matter, nonspecific but most often secondary to chronic small vessel ischemia Trace fluid within the bilateral mastoid air cells.   MRA Head  No intracranial large vessel occlusion or proximal high-grade arterial stenosis   MRA Neck The common carotid, internal carotid and  vertebral arteries are patent within the neck without stenosis.   EEG This study is within normal limits. The beta activity could be  seen due to benzodiapine use. No seizures or epileptiform discharges were seen throughout the recording.  ASSESSMENT AND PLAN  51 y.o. year old female with past medical history of breast cancer status post radiation treatment who is presenting for follow-up for seizure and headaches.  For seizure, she had a seizure-like event after colonoscopy.  Initial work-up including MRI, MRA and EEGs have been normal.  Daughter provided the description of the event on 2/17 as patient shaking her head like to say no with both arms shaking, no tongue biting, no urinary incontinence, no injury and no postictal confusion.  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  For her headaches, they sound like tension type headache, likely related or induced by stress.  Since this has been going on for the past 2 weeks I will give the patient a Medrol Dosepak to break the cycle.  I will also start her on Elavil which can help with her headaches, her  anxiety and also with her sleep.  We will also give the patient sumatriptan as needed for severe headaches.      1. Chronic tension-type headache, intractable      Patient Instructions  Start steroid as below:  Start with 6 tabs on day 1 Then 5 tabs on day 2 Then 4 tabs on day 3 Then 3 tabs on day 4 Then 2 tabs on day 5 Then 1 tab on day 6.  After the steroid Dosepak, start with amitriptyline 25 mg nightly.  Start with half a tab nightly for 2 weeks then increase to full tab thereafter. Use sumatriptan as needed for severe headaches, take 1 tab as soon as the headache start, you can take a second tab after 2 hours if headache is not resolved. Follow-up in 6 months.    No  orders of the defined types were placed in this encounter.   Meds ordered this encounter  Medications   methylPREDNISolone (MEDROL DOSEPAK) 4 MG TBPK tablet    Sig: 6 tabs day1, then 5 tabs day2, then 4 tabs day3, then 3 tabs day4, then 2 tabs day 5 and 1 tab day 6    Dispense:  21 tablet    Refill:  0   amitriptyline (ELAVIL) 25 MG tablet    Sig: Take 1 tablet (25 mg total) by mouth at bedtime.    Dispense:  30 tablet    Refill:  6   SUMAtriptan (IMITREX) 50 MG tablet    Sig: Take 1 tablet (50 mg total) by mouth every 2 (two) hours as needed for migraine. May repeat in 2 hours if headache persists or recurs.    Dispense:  10 tablet    Refill:  6    Return in about 6 months (around 07/11/2022).    Alric Ran, MD 01/11/2022, 6:03 PM  [facility] [address], [place], [place] [address] [phone]

## 2022-01-16 ENCOUNTER — Other Ambulatory Visit: Payer: Self-pay

## 2022-01-16 ENCOUNTER — Emergency Department (HOSPITAL_COMMUNITY): Payer: Medicaid Other

## 2022-01-16 ENCOUNTER — Encounter (HOSPITAL_COMMUNITY): Payer: Self-pay | Admitting: Emergency Medicine

## 2022-01-16 ENCOUNTER — Emergency Department (HOSPITAL_COMMUNITY)
Admission: EM | Admit: 2022-01-16 | Discharge: 2022-01-16 | Disposition: A | Discharge status: Home / Self Care | Payer: Medicaid Other | Attending: Emergency Medicine | Admitting: Emergency Medicine

## 2022-01-16 DIAGNOSIS — K529 Noninfective gastroenteritis and colitis, unspecified: Secondary | ICD-10-CM

## 2022-01-16 DIAGNOSIS — Z853 Personal history of malignant neoplasm of breast: Secondary | ICD-10-CM | POA: Diagnosis not present

## 2022-01-16 DIAGNOSIS — R1033 Periumbilical pain: Secondary | ICD-10-CM | POA: Diagnosis present

## 2022-01-16 DIAGNOSIS — R1084 Generalized abdominal pain: Secondary | ICD-10-CM

## 2022-01-16 DIAGNOSIS — R000 Tachycardia, unspecified: Secondary | ICD-10-CM | POA: Insufficient documentation

## 2022-01-16 LAB — COMPREHENSIVE METABOLIC PANEL
ALT: 21 U/L (ref 0–44)
AST: 28 U/L (ref 15–41)
Albumin: 4.2 g/dL (ref 3.5–5.0)
Alkaline Phosphatase: 52 U/L (ref 38–126)
Anion gap: 7 (ref 5–15)
BUN: 10 mg/dL (ref 6–20)
CO2: 25 mmol/L (ref 22–32)
Calcium: 9.1 mg/dL (ref 8.9–10.3)
Chloride: 108 mmol/L (ref 98–111)
Creatinine, Ser: 0.88 mg/dL (ref 0.44–1.00)
GFR, Estimated: >60 mL/min (ref >60)
Glucose, Bld: 132 mg/dL — ABNORMAL HIGH (ref 70–99)
Potassium: 3.6 mmol/L (ref 3.5–5.1)
Sodium: 140 mmol/L (ref 135–145)
Total Bilirubin: 0.9 mg/dL (ref 0.3–1.2)
Total Protein: 7.9 g/dL (ref 6.5–8.1)

## 2022-01-16 LAB — CBC
HCT: 37.3 % (ref 36.0–46.0)
Hemoglobin: 12.7 g/dL (ref 12.0–15.0)
MCH: 30 pg (ref 26.0–34.0)
MCHC: 34 g/dL (ref 30.0–36.0)
MCV: 88 fL (ref 80.0–100.0)
Platelets: 278 10*3/uL (ref 150–400)
RBC: 4.24 MIL/uL (ref 3.87–5.11)
RDW: 13.2 % (ref 11.5–15.5)
WBC: 4.2 10*3/uL (ref 4.0–10.5)
nRBC: 0 % (ref 0.0–0.2)

## 2022-01-16 LAB — URINALYSIS, ROUTINE W REFLEX MICROSCOPIC
Bilirubin Urine: NEGATIVE
Glucose, UA: NEGATIVE mg/dL
Hgb urine dipstick: NEGATIVE
Ketones, ur: NEGATIVE mg/dL
Leukocytes,Ua: NEGATIVE
Nitrite: NEGATIVE
Protein, ur: NEGATIVE mg/dL
Specific Gravity, Urine: 1.012 (ref 1.005–1.030)
pH: 5 (ref 5.0–8.0)

## 2022-01-16 LAB — LIPASE, BLOOD: Lipase: 35 U/L (ref 11–51)

## 2022-01-16 MED ORDER — ONDANSETRON HCL 4 MG PO TABS
4.0000 mg | ORAL_TABLET | Freq: Four times a day (QID) | ORAL | 0 refills | Status: DC
Start: 2022-01-16 — End: 2022-04-03

## 2022-01-16 MED ORDER — FENTANYL CITRATE PF 50 MCG/ML IJ SOSY
50.0000 ug | PREFILLED_SYRINGE | Freq: Once | INTRAMUSCULAR | Status: AC
  Administered 2022-01-16: 50 ug via INTRAVENOUS
  Filled 2022-01-16: qty 1

## 2022-01-16 MED ORDER — ONDANSETRON HCL 4 MG/2ML IJ SOLN
4.0000 mg | Freq: Once | INTRAMUSCULAR | Status: AC
  Administered 2022-01-16: 4 mg via INTRAVENOUS
  Filled 2022-01-16: qty 2

## 2022-01-16 MED ORDER — IOHEXOL 300 MG/ML  SOLN
100.0000 mL | Freq: Once | INTRAMUSCULAR | Status: AC | PRN
  Administered 2022-01-16: 100 mL via INTRAVENOUS

## 2022-01-16 MED ORDER — SODIUM CHLORIDE 0.9 % IV BOLUS
1000.0000 mL | Freq: Once | INTRAVENOUS | Status: AC
Start: 2022-01-16 — End: 2022-01-16
  Administered 2022-01-16: 1000 mL via INTRAVENOUS

## 2022-01-16 NOTE — ED Triage Notes (Signed)
Patient reports abdominal pain at umbilicus that wraps around her L flank. Reports nausea. Denies fevers or emesis.  Denies urinary symptoms.  ?

## 2022-01-16 NOTE — ED Provider Notes (Signed)
[facility] DEPT Provider Note   CSN: [id number] Arrival date & time: 01/16/22  1506     History  Chief Complaint  Patient presents with   Abdominal Pain   Nausea    Sheila [name] is a 51 y.o. female.  Patient is a 51 year old female who presents emergency department for abdominal pain for 2 weeks.  She describes it as a constant aching pain in her navel that radiates to her left flank.  She does have history of a tubal ligation 19 years ago, and states she continues to have drainage from her umbilicus. She is also complaining of nausea and lightheadedness.  Denies fevers, chills, emesis, urinary symptoms.  Normal bowel movements.  She has not taken anything for her symptoms.  The history is provided by the patient.  Abdominal Pain Pain location:  Periumbilical and L flank Associated symptoms: nausea   Associated symptoms: no chest pain, no chills, no constipation, no diarrhea, no dysuria, no fever, no hematuria, no shortness of breath and no vomiting       Home Medications Prior to Admission medications   Medication Sig Start Date End Date Taking? Authorizing Provider  ondansetron (ZOFRAN) 4 MG tablet Take 1 tablet (4 mg total) by mouth every 6 (six) hours. 01/16/22  Yes Suni Jarnagin T, [name]  amitriptyline (ELAVIL) 25 MG tablet Take 1 tablet (25 mg total) by mouth at bedtime. 01/11/22   Alric Ran, MD  ibuprofen (ADVIL) 800 MG tablet Take 1 tablet (800 mg total) by mouth every 8 (eight) hours as needed for moderate pain. 01/01/22   Truitt Merle, MD  methocarbamol (ROBAXIN) 500 MG tablet Take 1 tablet (500 mg total) by mouth every 6 (six) hours as needed for up to 30 doses for muscle spasms. 08/09/21   Antonieta Pert, MD  methylPREDNISolone (MEDROL DOSEPAK) 4 MG TBPK tablet 6 tabs day1, then 5 tabs day2, then 4 tabs day3, then 3 tabs day4, then 2 tabs day 5 and 1 tab day 6 01/11/22   Alric Ran, MD  prochlorperazine (COMPAZINE) 10 MG tablet TAKE  1 TABLET(10 MG) BY MOUTH EVERY 6 HOURS AS NEEDED FOR NAUSEA OR VOMITING 10/07/21   Tyler Pita, MD  SUMAtriptan (IMITREX) 50 MG tablet Take 1 tablet (50 mg total) by mouth every 2 (two) hours as needed for migraine. May repeat in 2 hours if headache persists or recurs. 01/11/22   Alric Ran, MD  triamcinolone cream (KENALOG) 0.1 % Apply topically 2 (two) times daily. 07/10/21   [provider]      Allergies    Anesthetics, amide and Other    Review of Systems   Review of Systems  Constitutional:  Negative for chills and fever.  Respiratory:  Negative for shortness of breath.   Cardiovascular:  Negative for chest pain.  Gastrointestinal:  Positive for abdominal pain and nausea. Negative for constipation, diarrhea and vomiting.  Genitourinary:  Negative for dysuria, frequency, hematuria and urgency.  Neurological:  Positive for light-headedness and headaches.  All other systems reviewed and are negative.  Physical Exam Updated Vital Signs BP (!) 140/93    Pulse 88    Temp 98.3 F (36.8 C) (Oral)    Resp 16    LMP 04/03/2018 (Within Days)    SpO2 98%  Physical Exam Vitals and nursing note reviewed.  Constitutional:      Appearance: Normal appearance.  HENT:     Head: Normocephalic and atraumatic.  Eyes:     Conjunctiva/sclera: Conjunctivae  normal.  Cardiovascular:     Rate and Rhythm: Normal rate and regular rhythm.  Pulmonary:     Effort: Pulmonary effort is normal. No respiratory distress.     Breath sounds: Normal breath sounds.  Abdominal:     General: There is no distension.     Palpations: Abdomen is soft.     Tenderness: There is generalized abdominal tenderness and tenderness in the periumbilical area. There is left CVA tenderness. There is no right CVA tenderness, guarding or rebound.  Skin:    General: Skin is warm and dry.  Neurological:     General: No focal deficit present.     Mental Status: She is alert.    ED Results / Procedures /  Treatments   Labs (all labs ordered are listed, but only abnormal results are displayed) Labs Reviewed  COMPREHENSIVE METABOLIC PANEL - Abnormal; Notable for the following components:      Result Value   Glucose, Bld 132 (*)    All other components within normal limits  LIPASE, BLOOD  CBC  URINALYSIS, ROUTINE W REFLEX MICROSCOPIC    EKG EKG Interpretation  Date/Time:  Saturday January 16 2022 15:55:06 EST Ventricular Rate:  100 PR Interval:  116 QRS Duration: 81 QT Interval:  330 QTC Calculation: 426 R Axis:   28 Text Interpretation: Sinus tachycardia Abnormal R-wave progression, early transition Abnormal T, consider ischemia, diffuse leads when compared to prior, similar appearance. NO STEMI Confirmed by Antony Blackbird [phone]) on 01/16/2022 5:39:47 PM  Radiology CT ABDOMEN PELVIS W CONTRAST  Result Date: 01/16/2022 CLINICAL DATA:  Mid abdominal pain. EXAM: CT ABDOMEN AND PELVIS WITH CONTRAST TECHNIQUE: Multidetector CT imaging of the abdomen and pelvis was performed using the standard protocol following bolus administration of intravenous contrast. RADIATION DOSE REDUCTION: This exam was performed according to the departmental dose-optimization program which includes automated exposure control, adjustment of the mA and/or kV according to patient size and/or use of iterative reconstruction technique. CONTRAST:  156m OMNIPAQUE IOHEXOL 300 MG/ML  SOLN COMPARISON:  CT 04/05/2021 FINDINGS: Lower chest: Mild hypoventilatory changes in the lung bases. No pleural effusion. Hepatobiliary: Minimal focal fatty infiltration adjacent to the falciform ligament. Heterogeneous partially exophytic lesion arising from the anterior inferior aspect of the right lobe is similar in appearance to 2022 noncontrast CT. Nondistended gallbladder. No calcified gallstone or biliary dilatation. Pancreas: No ductal dilatation or inflammation. Spleen: Normal in size without focal abnormality. Adrenals/Urinary Tract: Normal  adrenal glands. No hydronephrosis or perinephric edema. Homogeneous renal enhancement with symmetric excretion on delayed phase imaging. 13 mm cyst in the posterior mid left kidney. Additional tiny cortical hypodensities in the kidneys are too small to characterize but likely small cysts. No solid lesion or renal calculus. Urinary bladder is physiologically distended without wall thickening. Stomach/Bowel: Ingested material distends the stomach. There is no small bowel wall thickening or obstruction. Normal appendix. Small to moderate colonic stool burden. Distal descending and sigmoid colonic diverticulosis. No diverticulitis. Vascular/Lymphatic: No acute vascular findings. Normal caliber abdominal aorta. Patent portal, splenic, and mesenteric veins. No abdominopelvic adenopathy. Reproductive: Anteverted uterus. Ovaries tentatively visualized and quiescent. No adnexal mass. Other: No free air, free fluid, or intra-abdominal fluid collection. No abdominal wall hernia. Musculoskeletal: Lower lumbar facet hypertrophy and mild degenerative disc disease. There are no acute or suspicious osseous abnormalities. IMPRESSION: 1. No acute abnormality or explanation for abdominal pain. 2. Colonic diverticulosis without diverticulitis. 3. Unchanged appearance of right lobe liver lesion containing numerous calcifications. This is unchanged in appearance dating  back to 2019 abdominal CT favoring benign etiology. Electronically Signed   By: Keith Rake M.D.   On: 01/16/2022 17:30    Procedures Procedures    Medications Ordered in ED Medications  sodium chloride 0.9 % bolus 1,000 mL (1,000 mLs Intravenous New Bag/Given 01/16/22 1610)  ondansetron (ZOFRAN) injection 4 mg (4 mg Intravenous Given 01/16/22 1608)  fentaNYL (SUBLIMAZE) injection 50 mcg (50 mcg Intravenous Given 01/16/22 1609)  iohexol (OMNIPAQUE) 300 MG/ML solution 100 mL (100 mLs Intravenous Contrast Given 01/16/22 1705)    ED Course/ Medical Decision Making/  A&P                           Medical Decision Making Amount and/or Complexity of Data Reviewed Labs: ordered. Radiology: ordered.  Risk Prescription drug management.   This patient presents to the ED for concern of abdominal pain, this involves an extensive number of treatment options, and is a complaint that carries with it a high risk of complications and morbidity. The emergent differential diagnosis prior to evaluation includes, but is not limited to,  AAA, mesenteric ischemia, appendicitis, diverticulitis, DKA, gastritis, gastroenteritis, nephrolithiasis, pancreatitis, peritonitis, adrenal insufficiency, intestinal ischemia, constipation, UTI, SBO/LBO, splenic rupture, biliary disease, IBD, IBS, PUD, or hepatitis, ovarian torsion, PID.   Past Medical History / Co-morbidities: Breast cancer  \Physical Exam: Physical exam performed. The pertinent findings include: Patient is afebrile, tachycardic to 102.  Good oxygen saturation.  Generalized abdominal tenderness to palpation, worse in the periumbilical area with left CVA tenderness.  No guarding or rebound.  Lab Tests: I ordered, and personally interpreted labs.  The pertinent results include: No leukocytosis, normal hemoglobin, electrolytes within normal limits, lipase normal.  Urinalysis negative for hematuria or infection.   Imaging Studies: I ordered imaging studies including CT abdomen pelvis. I independently visualized and interpreted imaging which showed no acute intra-abdominal pathology. I agree with the radiologist interpretation.   Cardiac Monitoring:  The patient was maintained on a cardiac monitor.  My attending physician Dr. Sherry Ruffing viewed and interpreted the cardiac monitored which showed an underlying rhythm of: sinus tachycardia. I agree with this interpretation.    Medications: I ordered medication including IV fluids, anti emetics and analgesics  for abdominal pain and nausea. Reevaluation of the patient after  these medicines showed that the patient improved. I have reviewed the patients home medicines and have made adjustments as needed.  Disposition: After consideration of the diagnostic results and the patients response to treatment, I feel that patient's not requiring admission or inpatient treatment for her symptoms.  During her time in the emergency department she has remained afebrile and hemodynamically stable.  She reported improvement of her symptoms with medications given.  Overall work-up is unremarkable for acute etiology requiring hospitalization.  She stable for discharge to home, with symptoms of likely gastroenteritis.  We will treat symptomatically with good hydration, antiemetics, and a bland diet.  Discussed with the patient reasons to return to the emergency department, and she is agreeable to the plan.  Final Clinical Impression(s) / ED Diagnoses Final diagnoses:  Generalized abdominal pain  Gastroenteritis    Rx / DC Orders ED Discharge Orders          Ordered    ondansetron (ZOFRAN) 4 MG tablet  Every 6 hours        01/16/22 1748           Portions of this report may have been transcribed using voice recognition  software. Every effort was made to ensure accuracy; however, inadvertent computerized transcription errors may be present.    Estill Cotta 01/16/22 1751    Tegeler, Gwenyth Allegra, MD 01/16/22 [phone]

## 2022-01-16 NOTE — Discharge Instructions (Addendum)
You are seen in the emergency department today for abdominal pain. ? ?As we discussed your lab work and imaging has all looked reassuring today.  I think that your symptoms could be related to a viral GI infection, called gastroenteritis.  When I normally recommend for this is good hydration, nausea medicine which I will prescribe to you, and a bland diet for several days.  I have attached instructions about this. ? ?Continue to monitor how you are doing and return to the emergency department for any new or worsening symptoms such as fever, persistent vomiting, change or worsening of your pain. ?

## 2022-02-23 ENCOUNTER — Encounter (HOSPITAL_COMMUNITY): Payer: Self-pay | Admitting: Obstetrics and Gynecology

## 2022-04-01 ENCOUNTER — Ambulatory Visit
Admission: RE | Admit: 2022-04-01 | Discharge: 2022-04-01 | Disposition: A | Discharge status: Home / Self Care | Payer: Medicaid Other | Source: Ambulatory Visit | Attending: Nurse Practitioner | Admitting: Nurse Practitioner

## 2022-04-01 ENCOUNTER — Ambulatory Visit (HOSPITAL_COMMUNITY): Payer: Medicaid Other | Attending: Nurse Practitioner

## 2022-04-01 ENCOUNTER — Other Ambulatory Visit: Payer: Self-pay | Admitting: Nurse Practitioner

## 2022-04-01 DIAGNOSIS — Z170 Estrogen receptor positive status [ER+]: Secondary | ICD-10-CM

## 2022-04-01 DIAGNOSIS — N611 Abscess of the breast and nipple: Secondary | ICD-10-CM

## 2022-04-03 ENCOUNTER — Ambulatory Visit
Admission: EM | Admit: 2022-04-03 | Discharge: 2022-04-03 | Disposition: A | Discharge status: Home / Self Care | Payer: Medicaid Other | Attending: Emergency Medicine | Admitting: Emergency Medicine

## 2022-04-03 ENCOUNTER — Encounter: Payer: Self-pay | Admitting: Emergency Medicine

## 2022-04-03 DIAGNOSIS — M79672 Pain in left foot: Secondary | ICD-10-CM

## 2022-04-03 MED ORDER — PREDNISONE 10 MG PO TABS: 20.0000 mg | ORAL_TABLET | Freq: Two times a day (BID) | ORAL | 0 refills | Status: AC

## 2022-04-03 MED ORDER — KETOROLAC TROMETHAMINE 60 MG/2ML IM SOLN
60.0000 mg | Freq: Once | INTRAMUSCULAR | Status: AC
  Administered 2022-04-03: 60 mg via INTRAMUSCULAR

## 2022-04-03 NOTE — ED Triage Notes (Signed)
Pt here with swollen and very painful left heel x 1 week. Pt has hx of breast cancer and is currently being treated for an infection in her left breast with abx.

## 2022-04-03 NOTE — Discharge Instructions (Addendum)
I took a quick peek through your chart in your kidney function was last checked in March of this year and was completely normal heart rest assured  We checked 3 blood test today, uric acid level, a sedimentation rate and a complete blood count.  The results of these tests will help rule in or rule out gout as well as acute infection.  While I do think it is unlikely that you have an infection in your heel because you are taking doxycycline, please keep in mind the doxycycline is not typically used as first-line therapy for infections in the feet.  Once we have received the results of your tests, we will contact you by phone to let you know if there is anything further that needs to be done.  Certainly, if you have an elevated white blood cell count then we will be concerned about infection and may switch your doxycycline to something stronger which will still treat the infection in your breast but provide better coverage of an infection in your foot.  In the meantime, pain relief is my goal today.  You received an injection of a nonsteroidal anti-inflammatory pain medication called ketorolac which should provide you with meaningful pain relief over the next 6 to 8 hours.  Hopefully, it will at least get you through to bedtime when you will no longer need to be walking on your left foot.  Please do try to stay off the foot is much as possible, continuing to walk on it we will create more inflammation and this creates more pain.  Tomorrow morning, please begin taking prednisone, 20 mg twice daily for the next ... (however many days it takes for your pain to be significantly improved, usually 5 to 7) days.  Once your pain is improved and you can walk comfortably, please begin to break your tablets in half and take one half tablet twice daily for one full day.  Then further decrease to 1/2 tablet once daily for two days, then stop.  I have also included some information about gout and foods to avoid to  prevent further flareups, good information to have.  Thank you for visiting urgent care today.  We appreciate the opportunity participate in your care.

## 2022-04-03 NOTE — ED Provider Notes (Addendum)
[facility]    CSN: [id number] Arrival date & time: 04/03/22  1150    HISTORY   Chief Complaint  Patient presents with   Foot Pain   HPI Sheila [name] is a 51 y.o. female. Pt here with swollen and very painful left heel x 1 week.  Patient states her daughter is an Therapist, sports working cardiology and encouraged her to come to urgent care to be evaluated for gout.  Patient denies history of gout.  Patient states that the pain and swelling came on acutely and has not improved or worsened since it appeared.  Patient states her foot is also been red and warm to touch.  Patient denies fever, body aches, increased lower extremity swelling.  Patient states she does not have any similar issues with her right foot.  The history is provided by the patient.  Past Medical History:  Diagnosis Date   Anemia    Anginal pain ([place])    Breast cancer ([place])    Family history of bone cancer    Family history of breast cancer    Family history of prostate cancer    Family history of throat cancer    Malignant neoplasm of upper-outer quadrant of left breast in female, estrogen receptor positive ([place]) 04/08/2021   Patient Active Problem List   Diagnosis Date Noted   Seizure-like activity ([place]) 08/09/2021   Postmenopausal bleeding 06/02/2021   Genetic testing 04/21/2021   Family history of breast cancer    Family history of bone cancer    Family history of prostate cancer    Family history of throat cancer    Malignant neoplasm of upper-outer quadrant of left breast in female, estrogen receptor positive ([place]) 04/08/2021   Women's annual routine gynecological examination 02/25/2021   Screening examination for STD (sexually transmitted disease) 02/25/2021   Skin lesion 02/25/2021   Chest pain at rest 05/30/2014   Ejection fraction    Abnormal EKG 04/24/2014   Chest discomfort 04/23/2014   Panic attack 04/23/2014   OBESITY 01/15/2009   HAIR LOSS 01/15/2009   ANEMIA-NOS 01/14/2009   Past  Surgical History:  Procedure Laterality Date   AXILLARY LYMPH NODE BIOPSY Left 04/29/2021   Procedure: AXILLARY LYMPH NODE BIOPSY;  Surgeon: Rolm Bookbinder, MD;  Location: [place];  Service: General;  Laterality: Left;   BREAST LUMPECTOMY WITH RADIOACTIVE SEED AND SENTINEL LYMPH NODE BIOPSY Left 04/29/2021   Procedure: LEFT BREAST LUMPECTOMY WITH RADIOACTIVE SEED;  Surgeon: Rolm Bookbinder, MD;  Location: [place];  Service: General;  Laterality: Left;   CARDIAC CATHETERIZATION     LEFT HEART CATHETERIZATION WITH CORONARY ANGIOGRAM N/A 05/30/2014   Procedure: LEFT HEART CATHETERIZATION WITH CORONARY ANGIOGRAM;  Surgeon: Peter M Martinique, MD;  Location: [facility] CATH LAB;  Service: Cardiovascular;  Laterality: N/A;   TUBAL LIGATION     OB History     Gravida  8   Para  6   Term  4   Preterm  2   AB  2   Living  4      SAB  2   IAB      Ectopic      Multiple      Live Births             Home Medications    Prior to Admission medications   Medication Sig Start Date End Date Taking? Authorizing Provider  amitriptyline (ELAVIL) 25 MG tablet Take 1 tablet (25 mg total) by mouth at bedtime. 01/11/22  Alric Ran, MD  ibuprofen (ADVIL) 800 MG tablet Take 1 tablet (800 mg total) by mouth every 8 (eight) hours as needed for moderate pain. 01/01/22   Truitt Merle, MD  methocarbamol (ROBAXIN) 500 MG tablet Take 1 tablet (500 mg total) by mouth every 6 (six) hours as needed for up to 30 doses for muscle spasms. 08/09/21   Antonieta Pert, MD  SUMAtriptan (IMITREX) 50 MG tablet Take 1 tablet (50 mg total) by mouth every 2 (two) hours as needed for migraine. May repeat in 2 hours if headache persists or recurs. 01/11/22   Alric Ran, MD    Family History Family History  Problem Relation Age of Onset   Heart disease Mother    Hypertension Mother    Heart Problems Mother    Hyperlipidemia Mother    Breast cancer Mother 27   Diabetes Father    Seizures Father    Heart disease Sister     Crohn's disease Sister    Crohn's disease Brother    Hypertension Maternal Aunt    Colon cancer Maternal Uncle    Hypertension Maternal Uncle    Prostate cancer Maternal Uncle    Prostate cancer Maternal Uncle    Cancer Paternal Aunt        unknown type   Cancer Paternal Uncle        unknown type   Cancer Paternal Uncle        unknown type   Throat cancer Paternal Uncle    Hypertension Maternal Grandmother    Stroke Maternal Grandmother    Breast cancer Maternal Grandmother 96   Stroke Maternal Grandfather    Pancreatic cancer Cousin    Breast cancer Cousin        dx 51s, bilateral mastectomies (maternal first cousin)   Bone cancer Nephew 11       knee, great-nephew   Breast cancer Other        great-aunt (MGF's sister)   Breast cancer Other        first cousin once removed (MGF's niece)   Breast cancer Other        first cousin once removed (MGF's niece)   Cancer Other        unknown type, great-uncle (MGM's brother)   Breast cancer Other        first cousin once removed (MGM's niece)   Breast cancer Other        first cousin once removed (MGM's niece)   Prostate cancer Other        first cousin once removed (MGM's nephew)   Breast cancer Other        second cousin (MGM's great-niece)   Cancer Other        unknown type, dx 21s (maternal aunt's granddaughter)   Sickle cell anemia Other    Esophageal cancer Neg Hx    Stomach cancer Neg Hx    Rectal cancer Neg Hx    Social History Social History   Tobacco Use   Smoking status: Never   Smokeless tobacco: Never  Vaping Use   Vaping Use: Never used  Substance Use Topics   Alcohol use: No   Drug use: No   Allergies   Anesthetics, amide and Other  Review of Systems Review of Systems Pertinent findings noted in history of present illness.   Physical Exam Triage Vital Signs ED Triage Vitals  Enc Vitals Group     BP 09/11/21 0827 (!) 147/82     Pulse Rate 09/11/21  0827 72     Resp 09/11/21 0827 18      Temp 09/11/21 0827 98.3 F (36.8 C)     Temp Source 09/11/21 0827 Oral     SpO2 09/11/21 0827 98 %     Weight --      Height --      Head Circumference --      Peak Flow --      Pain Score 09/11/21 0826 5     Pain Loc --      Pain Edu? --      Excl. in [place]? --   No data found.  Updated Vital Signs BP (!) 128/91   Pulse 88   Temp 98.4 F (36.9 C)   Resp 20   LMP 04/03/2018 (Within Days)   SpO2 98%   Physical Exam Musculoskeletal:     Right ankle: Normal.     Right Achilles Tendon: Normal.     Left ankle: Normal.     Left Achilles Tendon: Normal.     Right foot: Normal.     Left foot: Normal range of motion and normal capillary refill. Swelling and tenderness present. No deformity, bunion, Charcot foot, foot drop, prominent metatarsal heads, laceration, bony tenderness or crepitus. Normal pulse.    Visual Acuity Right Eye Distance:   Left Eye Distance:   Bilateral Distance:    Right Eye Near:   Left Eye Near:    Bilateral Near:     UC Couse / Diagnostics / Procedures:    EKG  Radiology  Procedures Procedures (including critical care time)  UC Diagnoses / Final Clinical Impressions(s)   I have reviewed the triage vital signs and the nursing notes.  Pertinent labs & imaging results that were available during my care of the patient were reviewed by me and considered in my medical decision making (see chart for details).    Final diagnoses:  Left foot pain   Patient was provided with an injection of ketorolac for rapid relief of her pain in her left heel.  Patient was agreeable to checking a uric acid level, CBC with differential and a sed rate.  We will notify her of the results once received.  While waiting, she will begin taking prednisone 20 mg twice daily until her pain is resolved then taper down by half daily to complete treatment.  Patient advised she does not need to finish the full 10 days.  Patient further advised that if her lab work is normal and the  steroid is not improve her pain and swelling, she may require an x-ray of her heel to rule out osteophyte.  Return precautions advised.  ED Prescriptions     Medication Sig Dispense Auth. Provider   predniSONE (DELTASONE) 10 MG tablet Take 2 tablets (20 mg total) by mouth 2 (two) times daily with a meal for 10 days. 40 tablet [name] Scales, [name]      PDMP not reviewed this encounter.  Pending results:  Labs Reviewed  URIC ACID  CBC WITH DIFFERENTIAL/PLATELET  SEDIMENTATION RATE    Medications Ordered in UC: Medications  ketorolac (TORADOL) injection 60 mg (60 mg Intramuscular Given 04/03/22 1323)    Disposition Upon Discharge:  Condition: stable for discharge home Home: take medications as prescribed; routine discharge instructions as discussed; follow up as advised.  Patient presented with an acute illness with associated systemic symptoms and significant discomfort requiring urgent management. In my opinion, this is a condition that a prudent lay person (  someone who possesses an average knowledge of health and medicine) may potentially expect to result in complications if not addressed urgently such as respiratory distress, impairment of bodily function or dysfunction of bodily organs.   Routine symptom specific, illness specific and/or disease specific instructions were discussed with the patient and/or caregiver at length.   As such, the patient has been evaluated and assessed, work-up was performed and treatment was provided in alignment with urgent care protocols and evidence based medicine.  Patient/parent/caregiver has been advised that the patient may require follow up for further testing and treatment if the symptoms continue in spite of treatment, as clinically indicated and appropriate.  Patient/parent/caregiver has been advised to return to the [facility] or PCP if no better; to PCP or the Emergency Department if new signs and symptoms develop, or if the current signs  or symptoms continue to change or worsen for further workup, evaluation and treatment as clinically indicated and appropriate  The patient will follow up with their current PCP if and as advised. If the patient does not currently have a PCP we will assist them in obtaining one.   The patient may need specialty follow up if the symptoms continue, in spite of conservative treatment and management, for further workup, evaluation, consultation and treatment as clinically indicated and appropriate.   Patient/parent/caregiver verbalized understanding and agreement of plan as discussed.  All questions were addressed during visit.  Please see discharge instructions below for further details of plan.  Discharge Instructions:   Discharge Instructions      I took a quick peek through your chart in your kidney function was last checked in March of this year and was completely normal heart rest assured  We checked 3 blood test today, uric acid level, a sedimentation rate and a complete blood count.  The results of these tests will help rule in or rule out gout as well as acute infection.  While I do think it is unlikely that you have an infection in your heel because you are taking doxycycline, please keep in mind the doxycycline is not typically used as first-line therapy for infections in the feet.  Once we have received the results of your tests, we will contact you by phone to let you know if there is anything further that needs to be done.  Certainly, if you have an elevated white blood cell count then we will be concerned about infection and may switch your doxycycline to something stronger which will still treat the infection in your breast but provide better coverage of an infection in your foot.  In the meantime, pain relief is my goal today.  You received an injection of a nonsteroidal anti-inflammatory pain medication called ketorolac which should provide you with meaningful pain relief over the  next 6 to 8 hours.  Hopefully, it will at least get you through to bedtime when you will no longer need to be walking on your left foot.  Please do try to stay off the foot is much as possible, continuing to walk on it we will create more inflammation and this creates more pain.  Tomorrow morning, please begin taking prednisone, 20 mg twice daily for the next ... (however many days it takes for your pain to be significantly improved, usually 5 to 7) days.  Once your pain is improved and you can walk comfortably, please begin to break your tablets in half and take one half tablet twice daily for one full day.  Then further  decrease to 1/2 tablet once daily for two days, then stop.  I have also included some information about gout and foods to avoid to prevent further flareups, good information to have.  Thank you for visiting urgent care today.  We appreciate the opportunity participate in your care.        This office note has been dictated using Museum/gallery curator.  Unfortunately, and despite my best efforts, this method of dictation can sometimes lead to occasional typographical or grammatical errors.  I apologize in advance if this occurs.     [name] Scales, [name] 04/03/22 1620    [name] Scales, [name] 04/03/22 1620

## 2022-04-04 ENCOUNTER — Telehealth: Payer: Self-pay | Admitting: Emergency Medicine

## 2022-04-04 LAB — CBC WITH DIFFERENTIAL/PLATELET
Basophils Absolute: 0 10*3/uL (ref 0.0–0.2)
Basos: 1 %
EOS (ABSOLUTE): 0.1 10*3/uL (ref 0.0–0.4)
Eos: 2 %
Hematocrit: 37.5 % (ref 34.0–46.6)
Hemoglobin: 12.5 g/dL (ref 11.1–15.9)
Immature Grans (Abs): 0 10*3/uL (ref 0.0–0.1)
Immature Granulocytes: 0 %
Lymphocytes Absolute: 1.1 10*3/uL (ref 0.7–3.1)
Lymphs: 32 %
MCH: 29.1 pg (ref 26.6–33.0)
MCHC: 33.3 g/dL (ref 31.5–35.7)
MCV: 87 fL (ref 79–97)
Monocytes Absolute: 0.3 10*3/uL (ref 0.1–0.9)
Monocytes: 8 %
Neutrophils Absolute: 2.1 10*3/uL (ref 1.4–7.0)
Neutrophils: 57 %
Platelets: 267 10*3/uL (ref 150–450)
RBC: 4.3 x10E6/uL (ref 3.77–5.28)
RDW: 12.9 % (ref 11.7–15.4)
WBC: 3.6 10*3/uL (ref 3.4–10.8)

## 2022-04-04 LAB — SEDIMENTATION RATE: Sed Rate: 17 mm/hr (ref 0–40)

## 2022-04-04 LAB — URIC ACID: Uric Acid: 5.3 mg/dL (ref 3.0–7.2)

## 2022-04-04 NOTE — Telephone Encounter (Signed)
Labs performed at urgent care visit on Apr 03, 2022 did not reveal concern for gout or acute infection in her left heel.  Patient advised that I recommend x-ray, this has been ordered for her at one of her med center locations.  Patient advised she can have this done at her leisure and that once we receive the x-ray results we will reach out to her to let her know if there are any skeletal concerns causing her pain.

## 2022-04-11 ENCOUNTER — Ambulatory Visit (HOSPITAL_BASED_OUTPATIENT_CLINIC_OR_DEPARTMENT_OTHER)
Admission: RE | Admit: 2022-04-11 | Discharge: 2022-04-11 | Disposition: A | Discharge status: Home / Self Care | Payer: Medicaid Other | Source: Ambulatory Visit | Attending: Emergency Medicine | Admitting: Emergency Medicine

## 2022-04-11 DIAGNOSIS — M7732 Calcaneal spur, left foot: Secondary | ICD-10-CM | POA: Diagnosis not present

## 2022-04-11 DIAGNOSIS — M2012 Hallux valgus (acquired), left foot: Secondary | ICD-10-CM | POA: Diagnosis not present

## 2022-04-11 DIAGNOSIS — M79672 Pain in left foot: Secondary | ICD-10-CM | POA: Diagnosis present

## 2022-04-13 NOTE — Progress Notes (Unsigned)
Office Visit    Patient Name: Sheila [name] Date of Encounter: 04/14/2022  Primary Care Provider:  Griffin Basil, MD Primary Cardiologist:  Elouise [name], MD  Chief Complaint    51 year old female with a history of atypical chest pain, hyperlipidemia, anemia, and left breast cancer who presents for follow-up related to chest pain.  Past Medical History    Past Medical History:  Diagnosis Date   Anemia    Anginal pain ([place])    Breast cancer ([place])    Family history of bone cancer    Family history of breast cancer    Family history of prostate cancer    Family history of throat cancer    Malignant neoplasm of upper-outer quadrant of left breast in female, estrogen receptor positive ([place]) 04/08/2021   Past Surgical History:  Procedure Laterality Date   AXILLARY LYMPH NODE BIOPSY Left 04/29/2021   Procedure: AXILLARY LYMPH NODE BIOPSY;  Surgeon: Rolm Bookbinder, MD;  Location: [place];  Service: General;  Laterality: Left;   BREAST LUMPECTOMY WITH RADIOACTIVE SEED AND SENTINEL LYMPH NODE BIOPSY Left 04/29/2021   Procedure: LEFT BREAST LUMPECTOMY WITH RADIOACTIVE SEED;  Surgeon: Rolm Bookbinder, MD;  Location: [place];  Service: General;  Laterality: Left;   CARDIAC CATHETERIZATION     LEFT HEART CATHETERIZATION WITH CORONARY ANGIOGRAM N/A 05/30/2014   Procedure: LEFT HEART CATHETERIZATION WITH CORONARY ANGIOGRAM;  Surgeon: Peter M Martinique, MD;  Location: [facility] CATH LAB;  Service: Cardiovascular;  Laterality: N/A;   TUBAL LIGATION      Allergies  Allergies  Allergen Reactions   Anesthetics, Amide Other (See Comments)    seizures   Other Other (See Comments)    General Anesthesia caused seizures-Propofol    History of Present Illness    51 year old female with the above past medical history including atypical chest pain, hyperlipidemia, anemia, and left breast cancer.   She has a history of atypical chest pain. Coronary CT angiogram in 2021 showed minimal CAD  (calcium score of 0, RCA less than 25% stenosis).  Echocardiogram at the time showed EF 65 to 70%, normal LV function, mild concentric LVH, normal LV diastolic parameters, normal RV systolic function, no significant valvular abnormalities.  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  She was last seen in the office on 08/06/2021 and was stable overall from a cardiac standpoint.  She denied any symptoms concerning for angina.    She presents today for follow-up accompanied by her husband.  Since her last visit stable overall from a cardiac standpoint.  She was recently noted to have a "pocket of fluid" around her left breast, and has follow-up scheduled with oncology.  She did note an episode of left breast pain that occurred last night, this made her anxious and she did have some mild associated shortness of breath.  She does not think her pain was cardiac related.  She denies exertional symptoms concerning for angina. She does report an increase in personal stress recently. Otherwise, she reports feeling well and denies any new concerns today.  Home Medications    Current Outpatient Medications  Medication Sig Dispense Refill   betamethasone valerate ointment (VALISONE) 0.1 % Apply topically 2 (two) times daily.     doxycycline (VIBRAMYCIN) 100 MG capsule Take 100 mg by mouth 2 (two) times daily.     No current facility-administered medications for this visit.  Review of Systems    She denies chest pain, palpitations, dyspnea, pnd, orthopnea, n, v, dizziness, syncope, edema, weight gain, or early satiety. All other systems reviewed and are otherwise negative except as noted above.   Physical Exam    VS:  BP 128/80   Pulse 72   Ht '5\' 9"'$  (1.753 m)   Wt 211 lb (95.7 kg)   LMP 04/03/2018 (Within Days)   SpO2 98%   BMI 31.16 kg/m    GEN: Well nourished, well  developed, in no acute distress. HEENT: normal. Neck: Supple, no JVD, carotid bruits, or masses. Cardiac: RRR, no murmurs, rubs, or gallops. No clubbing, cyanosis, edema.  Radials/DP/PT 2+ and equal bilaterally.  Respiratory:  Respirations regular and unlabored, clear to auscultation bilaterally. GI: Soft, nontender, nondistended, BS + x 4. MS: no deformity or atrophy. Skin: warm and dry, no rash. Neuro:  Strength and sensation are intact. Psych: Normal affect.  Accessory Clinical Findings    ECG personally reviewed by me today -NSR, 72 bpm, nonspecific t wave abnormality- no acute changes.  Lab Results  Component Value Date   WBC 3.6 04/03/2022   HGB 12.5 04/03/2022   HCT 37.5 04/03/2022   MCV 87 04/03/2022   PLT 267 04/03/2022   Lab Results  Component Value Date   CREATININE 0.88 01/16/2022   BUN 10 01/16/2022   NA 140 01/16/2022   K 3.6 01/16/2022   CL 108 01/16/2022   CO2 25 01/16/2022   Lab Results  Component Value Date   ALT 21 01/16/2022   AST 28 01/16/2022   ALKPHOS 52 01/16/2022   BILITOT 0.9 01/16/2022   Lab Results  Component Value Date   CHOL 213 (H) 06/30/2020   HDL 55 06/30/2020   LDLCALC 141 (H) 06/30/2020   TRIG 93 06/30/2020   CHOLHDL 3.9 06/30/2020    Lab Results  Component Value Date   HGBA1C 5.3 06/30/2020    Assessment & Plan    1. Atypical chest pain: Coronary CT angiogram in 2021 showed minimal CAD (calcium score of 0, RCA less than 25% stenosis). Echo at the time showed EF 65 to 70%, normal LV function, mild concentric LVH, normal LV diastolic parameters, normal RV systolic function, no significant valvular abnormalities. Stable with no anginal symptoms. No indication for ischemic evaluation.   2. Hyperlipidemia: No recent LDL on file (was 121 in 06/2020).  She is not fasting today.  Consider repeat lipid panel at next follow-up visit.  3. Left breast cancer: Following with oncology.  4. Disposition: Follow-up in 6 months.   Lenna Sciara, NP 04/14/2022, 9:19 AM

## 2022-04-14 ENCOUNTER — Ambulatory Visit (INDEPENDENT_AMBULATORY_CARE_PROVIDER_SITE_OTHER): Payer: Medicaid Other | Admitting: Nurse Practitioner

## 2022-04-14 ENCOUNTER — Encounter: Payer: Self-pay | Admitting: Nurse Practitioner

## 2022-04-14 VITALS — BP 128/80 | HR 72 | Ht 69.0 in | Wt 211.0 lb

## 2022-04-14 DIAGNOSIS — R072 Precordial pain: Secondary | ICD-10-CM

## 2022-04-14 DIAGNOSIS — Z170 Estrogen receptor positive status [ER+]: Secondary | ICD-10-CM

## 2022-04-14 DIAGNOSIS — C50412 Malignant neoplasm of upper-outer quadrant of left female breast: Secondary | ICD-10-CM

## 2022-04-14 DIAGNOSIS — E782 Mixed hyperlipidemia: Secondary | ICD-10-CM

## 2022-04-14 NOTE — Patient Instructions (Signed)
Medication Instructions:  Your physician recommends that you continue on your current medications as directed. Please refer to the Current Medication list given to you today.   *If you need a refill on your cardiac medications before your next appointment, please call your pharmacy*   Lab Work: NONE ordered at this time of appointment   If you have labs (blood work) drawn today and your tests are completely normal, you will receive your results only by: MyChart Message (if you have MyChart) OR A paper copy in the mail If you have any lab test that is abnormal or we need to change your treatment, we will call you to review the results.   Testing/Procedures: NONE ordered at this time of appointment    Follow-Up: At [facility], you and your health needs are our priority.  As part of our continuing mission to provide you with exceptional heart care, we have created designated Provider Care Teams.  These Care Teams include your primary Cardiologist (physician) and Advanced Practice Providers (APPs -  Physician Assistants and Nurse Practitioners) who all work together to provide you with the care you need, when you need it.  We recommend signing up for the patient portal called "MyChart".  Sign up information is provided on this After Visit Summary.  MyChart is used to connect with patients for Virtual Visits (Telemedicine).  Patients are able to view lab/test results, encounter notes, upcoming appointments, etc.  Non-urgent messages can be sent to your provider as well.   To learn more about what you can do with MyChart, go to [email or web address].    Your next appointment:   6 month(s)  The format for your next appointment:   In Person  Provider:   Gayatri A Acharya, MD     Other Instructions   Important Information About Sugar       

## 2022-04-23 ENCOUNTER — Other Ambulatory Visit: Payer: Medicaid Other

## 2022-04-26 ENCOUNTER — Ambulatory Visit
Admission: RE | Admit: 2022-04-26 | Discharge: 2022-04-26 | Disposition: A | Discharge status: Home / Self Care | Payer: Medicaid Other | Source: Ambulatory Visit | Attending: Nurse Practitioner | Admitting: Nurse Practitioner

## 2022-04-26 DIAGNOSIS — N611 Abscess of the breast and nipple: Secondary | ICD-10-CM

## 2022-04-26 DIAGNOSIS — Z170 Estrogen receptor positive status [ER+]: Secondary | ICD-10-CM

## 2022-05-03 ENCOUNTER — Ambulatory Visit: Payer: Medicaid Other

## 2022-05-03 ENCOUNTER — Ambulatory Visit (INDEPENDENT_AMBULATORY_CARE_PROVIDER_SITE_OTHER): Payer: Medicaid Other | Admitting: Podiatry

## 2022-05-03 DIAGNOSIS — M722 Plantar fascial fibromatosis: Secondary | ICD-10-CM | POA: Diagnosis not present

## 2022-05-03 MED ORDER — BETAMETHASONE SOD PHOS & ACET 6 (3-3) MG/ML IJ SUSP
3.0000 mg | Freq: Once | INTRAMUSCULAR | Status: AC
  Administered 2022-05-03: 3 mg via INTRA_ARTICULAR

## 2022-05-03 NOTE — Progress Notes (Signed)
   Subjective: 51 y.o. female presenting today as a new patient for evaluation of left heel pain this been going on for about 1 month now.  She denies a history of injury.  She has not done anything for treatment.  She presents for further treatment and evaluation   Past Medical History:  Diagnosis Date   Anemia    Anginal pain ([place])    Breast cancer ([place])    Family history of bone cancer    Family history of breast cancer    Family history of prostate cancer    Family history of throat cancer    Malignant neoplasm of upper-outer quadrant of left breast in female, estrogen receptor positive ([place]) 04/08/2021   Past Surgical History:  Procedure Laterality Date   AXILLARY LYMPH NODE BIOPSY Left 04/29/2021   Procedure: AXILLARY LYMPH NODE BIOPSY;  Surgeon: Rolm Bookbinder, MD;  Location: [place];  Service: General;  Laterality: Left;   BREAST LUMPECTOMY WITH RADIOACTIVE SEED AND SENTINEL LYMPH NODE BIOPSY Left 04/29/2021   Procedure: LEFT BREAST LUMPECTOMY WITH RADIOACTIVE SEED;  Surgeon: Rolm Bookbinder, MD;  Location: [place];  Service: General;  Laterality: Left;   CARDIAC CATHETERIZATION     LEFT HEART CATHETERIZATION WITH CORONARY ANGIOGRAM N/A 05/30/2014   Procedure: LEFT HEART CATHETERIZATION WITH CORONARY ANGIOGRAM;  Surgeon: Peter M Martinique, MD;  Location: [facility] CATH LAB;  Service: Cardiovascular;  Laterality: N/A;   TUBAL LIGATION     Allergies  Allergen Reactions   Anesthetics, Amide Other (See Comments)    seizures   Other Other (See Comments)    General Anesthesia caused seizures-Propofol     Objective: Physical Exam General: The patient is alert and oriented x3 in no acute distress.  Dermatology: Skin is warm, dry and supple bilateral lower extremities. Negative for open lesions or macerations bilateral.   Vascular: Dorsalis Pedis and Posterior Tibial pulses palpable bilateral.  Capillary fill time is immediate to all digits.  Neurological: Epicritic and protective  threshold intact bilateral.   Musculoskeletal: Tenderness to palpation to the plantar aspect of the left heel along the plantar fascia. All other joints range of motion within normal limits bilateral. Strength 5/5 in all groups bilateral.   Radiographic exam: Normal osseous mineralization. Joint spaces preserved. No fracture/dislocation/boney destruction. No other soft tissue abnormalities or radiopaque foreign bodies.   Assessment: 1. Plantar fasciitis left foot Plantar and posterior heel spur left  Plan of Care:  1. Patient evaluated. Xrays reviewed.   2. Injection of 0.5cc Celestone soluspan injected into the left plantar fascia.  3.  Patient states that she is currently on a steroid Dosepak for an unrelated issue  4.  Patient also states that she has a prescription for Motrin 800 mg.  Begin taking after completion of the Dosepak 5. Plantar fascial band(s) dispensed  6. Instructed patient regarding therapies and modalities at home to alleviate symptoms.  7. Return to clinic in 4 weeks.    *Currently not working   Edrick Kins, DPM [facility]  Dr. Edrick Kins, DPM    [address], [place] [address]                Office [phone]  Fax [phone]

## 2022-05-07 ENCOUNTER — Inpatient Hospital Stay: Payer: Medicaid Other | Attending: Hematology | Admitting: Hematology

## 2022-05-07 ENCOUNTER — Inpatient Hospital Stay: Payer: Medicaid Other

## 2022-05-07 ENCOUNTER — Encounter: Payer: Self-pay | Admitting: Hematology

## 2022-05-07 VITALS — BP 130/79 | HR 77 | Temp 98.5°F | Resp 15 | Wt 212.4 lb

## 2022-05-07 DIAGNOSIS — R251 Tremor, unspecified: Secondary | ICD-10-CM | POA: Diagnosis not present

## 2022-05-07 DIAGNOSIS — R519 Headache, unspecified: Secondary | ICD-10-CM | POA: Diagnosis not present

## 2022-05-07 DIAGNOSIS — R5383 Other fatigue: Secondary | ICD-10-CM | POA: Insufficient documentation

## 2022-05-07 DIAGNOSIS — Z170 Estrogen receptor positive status [ER+]: Secondary | ICD-10-CM | POA: Diagnosis not present

## 2022-05-07 DIAGNOSIS — Z79811 Long term (current) use of aromatase inhibitors: Secondary | ICD-10-CM | POA: Diagnosis not present

## 2022-05-07 DIAGNOSIS — Z780 Asymptomatic menopausal state: Secondary | ICD-10-CM | POA: Insufficient documentation

## 2022-05-07 DIAGNOSIS — R42 Dizziness and giddiness: Secondary | ICD-10-CM | POA: Insufficient documentation

## 2022-05-07 DIAGNOSIS — C50412 Malignant neoplasm of upper-outer quadrant of left female breast: Secondary | ICD-10-CM

## 2022-05-07 DIAGNOSIS — Z923 Personal history of irradiation: Secondary | ICD-10-CM | POA: Insufficient documentation

## 2022-05-07 DIAGNOSIS — Z803 Family history of malignant neoplasm of breast: Secondary | ICD-10-CM | POA: Insufficient documentation

## 2022-05-07 LAB — CBC WITH DIFFERENTIAL (CANCER CENTER ONLY)
Abs Immature Granulocytes: 0 10*3/uL (ref 0.00–0.07)
Basophils Absolute: 0 10*3/uL (ref 0.0–0.1)
Basophils Relative: 1 %
Eosinophils Absolute: 0.1 10*3/uL (ref 0.0–0.5)
Eosinophils Relative: 2 %
HCT: 37.1 % (ref 36.0–46.0)
Hemoglobin: 12.5 g/dL (ref 12.0–15.0)
Immature Granulocytes: 0 %
Lymphocytes Relative: 32 %
Lymphs Abs: 1.2 10*3/uL (ref 0.7–4.0)
MCH: 29.1 pg (ref 26.0–34.0)
MCHC: 33.7 g/dL (ref 30.0–36.0)
MCV: 86.3 fL (ref 80.0–100.0)
Monocytes Absolute: 0.3 10*3/uL (ref 0.1–1.0)
Monocytes Relative: 8 %
Neutro Abs: 2.2 10*3/uL (ref 1.7–7.7)
Neutrophils Relative %: 57 %
Platelet Count: 277 10*3/uL (ref 150–400)
RBC: 4.3 MIL/uL (ref 3.87–5.11)
RDW: 13 % (ref 11.5–15.5)
WBC Count: 3.8 10*3/uL — ABNORMAL LOW (ref 4.0–10.5)
nRBC: 0 % (ref 0.0–0.2)

## 2022-05-07 LAB — CMP (CANCER CENTER ONLY)
ALT: 18 U/L (ref 0–44)
AST: 19 U/L (ref 15–41)
Albumin: 4.4 g/dL (ref 3.5–5.0)
Alkaline Phosphatase: 60 U/L (ref 38–126)
Anion gap: 7 (ref 5–15)
BUN: 12 mg/dL (ref 6–20)
CO2: 26 mmol/L (ref 22–32)
Calcium: 9.7 mg/dL (ref 8.9–10.3)
Chloride: 108 mmol/L (ref 98–111)
Creatinine: 0.95 mg/dL (ref 0.44–1.00)
GFR, Estimated: >60 mL/min (ref >60)
Glucose, Bld: 91 mg/dL (ref 70–99)
Potassium: 3.9 mmol/L (ref 3.5–5.1)
Sodium: 141 mmol/L (ref 135–145)
Total Bilirubin: 0.9 mg/dL (ref 0.3–1.2)
Total Protein: 7.5 g/dL (ref 6.5–8.1)

## 2022-05-07 MED ORDER — EXEMESTANE 25 MG PO TABS: 25.0000 mg | ORAL_TABLET | Freq: Every day | ORAL | 3 refills | Status: DC

## 2022-05-31 ENCOUNTER — Ambulatory Visit (INDEPENDENT_AMBULATORY_CARE_PROVIDER_SITE_OTHER): Payer: Medicaid Other | Admitting: Podiatry

## 2022-05-31 DIAGNOSIS — M722 Plantar fascial fibromatosis: Secondary | ICD-10-CM | POA: Diagnosis not present

## 2022-05-31 MED ORDER — MELOXICAM 15 MG PO TABS: 15.0000 mg | ORAL_TABLET | Freq: Every day | ORAL | 1 refills | Status: DC

## 2022-05-31 NOTE — Progress Notes (Signed)
Chief Complaint  Patient presents with   Follow-up    Left heel pain here for 1 month follow-up.   Subjective: 51 y.o. female presenting today for follow-up evaluation of left heel pain.  Patient states that she is not much better.  She says that the plantar fascial brace, Medrol Dosepak, Motrin 800 have not been helping.  She says also that the injection did not help.  She presents for further treatment and evaluation   Past Medical History:  Diagnosis Date   Anemia    Anginal pain ([place])    Breast cancer ([place])    Family history of bone cancer    Family history of breast cancer    Family history of prostate cancer    Family history of throat cancer    Malignant neoplasm of upper-outer quadrant of left breast in female, estrogen receptor positive ([place]) 04/08/2021   Past Surgical History:  Procedure Laterality Date   AXILLARY LYMPH NODE BIOPSY Left 04/29/2021   Procedure: AXILLARY LYMPH NODE BIOPSY;  Surgeon: Rolm Bookbinder, MD;  Location: [place];  Service: General;  Laterality: Left;   BREAST LUMPECTOMY WITH RADIOACTIVE SEED AND SENTINEL LYMPH NODE BIOPSY Left 04/29/2021   Procedure: LEFT BREAST LUMPECTOMY WITH RADIOACTIVE SEED;  Surgeon: Rolm Bookbinder, MD;  Location: [place];  Service: General;  Laterality: Left;   CARDIAC CATHETERIZATION     LEFT HEART CATHETERIZATION WITH CORONARY ANGIOGRAM N/A 05/30/2014   Procedure: LEFT HEART CATHETERIZATION WITH CORONARY ANGIOGRAM;  Surgeon: Peter M Martinique, MD;  Location: [facility] CATH LAB;  Service: Cardiovascular;  Laterality: N/A;   TUBAL LIGATION     Allergies  Allergen Reactions   Anesthetics, Amide Other (See Comments)    seizures   Other Other (See Comments)    General Anesthesia caused seizures-Propofol     Objective: Physical Exam General: The patient is alert and oriented x3 in no acute distress.  Dermatology: Skin is warm, dry and supple bilateral lower extremities. Negative for open lesions or macerations bilateral.    Vascular: Dorsalis Pedis and Posterior Tibial pulses palpable bilateral.  Capillary fill time is immediate to all digits.  Neurological: Epicritic and protective threshold intact bilateral.   Musculoskeletal: There continues to be tenderness to palpation to the plantar aspect of the left heel along the plantar fascia. All other joints range of motion within normal limits bilateral. Strength 5/5 in all groups bilateral.   Radiographic exam 05/03/2022 LT foot: Normal osseous mineralization. Joint spaces preserved. No fracture/dislocation/boney destruction. No other soft tissue abnormalities or radiopaque foreign bodies.   Assessment: 1. Plantar fasciitis left foot 2.  Plantar and posterior heel spur left  Plan of Care:  1. Patient evaluated. Xrays reviewed.   2.  Patient declined injection today 3.  Patient was unable to complete the steroid Dosepak due to nausea 4.  Patient also quit taking the Motrin 800 mg.  She says it was not helping 5.  Compression ankle sleeve dispensed.  Wear daily.  Patient may also continue the plantar fascial brace 6.  Prescription for meloxicam 15 mg daily 7. Return to clinic in 4 weeks.    *Currently not working   Edrick Kins, DPM [facility]  Dr. Edrick Kins, DPM    [address],  [place] [address]                Office [phone]  Fax [phone]

## 2022-06-20 ENCOUNTER — Emergency Department (HOSPITAL_COMMUNITY): Payer: Medicaid Other

## 2022-06-20 ENCOUNTER — Emergency Department (HOSPITAL_COMMUNITY)
Admission: EM | Admit: 2022-06-20 | Discharge: 2022-06-21 | Disposition: A | Discharge status: Home / Self Care | Payer: Medicaid Other | Attending: Emergency Medicine | Admitting: Emergency Medicine

## 2022-06-20 DIAGNOSIS — R0602 Shortness of breath: Secondary | ICD-10-CM | POA: Diagnosis not present

## 2022-06-20 DIAGNOSIS — I2510 Atherosclerotic heart disease of native coronary artery without angina pectoris: Secondary | ICD-10-CM | POA: Diagnosis not present

## 2022-06-20 DIAGNOSIS — M542 Cervicalgia: Secondary | ICD-10-CM | POA: Diagnosis not present

## 2022-06-20 DIAGNOSIS — Z853 Personal history of malignant neoplasm of breast: Secondary | ICD-10-CM | POA: Diagnosis not present

## 2022-06-20 DIAGNOSIS — R079 Chest pain, unspecified: Secondary | ICD-10-CM | POA: Diagnosis present

## 2022-06-20 DIAGNOSIS — R110 Nausea: Secondary | ICD-10-CM | POA: Diagnosis not present

## 2022-06-20 LAB — BASIC METABOLIC PANEL
Anion gap: 7 (ref 5–15)
BUN: 13 mg/dL (ref 6–20)
CO2: 24 mmol/L (ref 22–32)
Calcium: 9.2 mg/dL (ref 8.9–10.3)
Chloride: 111 mmol/L (ref 98–111)
Creatinine, Ser: 0.99 mg/dL (ref 0.44–1.00)
GFR, Estimated: >60 mL/min (ref >60)
Glucose, Bld: 125 mg/dL — ABNORMAL HIGH (ref 70–99)
Potassium: 3 mmol/L — ABNORMAL LOW (ref 3.5–5.1)
Sodium: 142 mmol/L (ref 135–145)

## 2022-06-20 LAB — CBC
HCT: 35.3 % — ABNORMAL LOW (ref 36.0–46.0)
Hemoglobin: 12 g/dL (ref 12.0–15.0)
MCH: 29.3 pg (ref 26.0–34.0)
MCHC: 34 g/dL (ref 30.0–36.0)
MCV: 86.3 fL (ref 80.0–100.0)
Platelets: 284 10*3/uL (ref 150–400)
RBC: 4.09 MIL/uL (ref 3.87–5.11)
RDW: 12.8 % (ref 11.5–15.5)
WBC: 8.6 10*3/uL (ref 4.0–10.5)
nRBC: 0 % (ref 0.0–0.2)

## 2022-06-20 LAB — TROPONIN I (HIGH SENSITIVITY)
Troponin I (High Sensitivity): 5 ng/L (ref <18)
Troponin I (High Sensitivity): 6 ng/L (ref <18)

## 2022-06-20 LAB — I-STAT BETA HCG BLOOD, ED (MC, WL, AP ONLY): I-stat hCG, quantitative: <5 m[IU]/mL (ref <5)

## 2022-06-20 MED ORDER — POTASSIUM CHLORIDE CRYS ER 20 MEQ PO TBCR
40.0000 meq | EXTENDED_RELEASE_TABLET | Freq: Once | ORAL | Status: AC
  Administered 2022-06-21: 40 meq via ORAL
  Filled 2022-06-20: qty 2

## 2022-06-20 MED ORDER — IOHEXOL 350 MG/ML SOLN
75.0000 mL | Freq: Once | INTRAVENOUS | Status: AC | PRN
  Administered 2022-06-20: 75 mL via INTRAVENOUS

## 2022-06-20 NOTE — ED Provider Notes (Signed)
[facility] EMERGENCY DEPARTMENT Provider Note   CSN: [id number] Arrival date & time: 06/20/22  2045     History  Chief Complaint  Patient presents with   Chest Pain    Sheila [name] is a 51 y.o. female with a history of breast cancer (s/p lumpectomy and radiation therapy), nonobstructive CAD, seizure like activity presents with chest pain. Patient reports that she has been having sharp stabbing chest pains on and off for the last week, she initially was having a few episodes per day that last a few minutes, but now they are getting worse. Today she had an episode of acute shortness of breath and felt like she couldn't get enough air, she is still feeling slightly short of breath now. The pain and shortness of breath woke her from sleep. She also had associated nausea and pain in her neck. She has not noticed any swelling in her legs or abdomen. No changes to medications or any abnormal circumstances in the last few weeks.  She does note that she was told of fluid build up in her chest and started taking prednisone yesterday.     Home Medications Prior to Admission medications   Medication Sig Start Date End Date Taking? Authorizing Provider  betamethasone valerate ointment (VALISONE) 0.1 % Apply topically 2 (two) times daily. 11/17/21   [provider]  doxycycline (VIBRAMYCIN) 100 MG capsule Take 100 mg by mouth 2 (two) times daily. 04/01/22   [provider]  exemestane (AROMASIN) 25 MG tablet Take 1 tablet (25 mg total) by mouth daily after breakfast. 05/07/22   Truitt Merle, MD  meloxicam (MOBIC) 15 MG tablet Take 1 tablet (15 mg total) by mouth daily. 05/31/22   Edrick Kins, DPM      Allergies    Anesthetics, amide and Other    Review of Systems   Review of Systems  Constitutional:  Positive for activity change.  HENT:  Negative for congestion and trouble swallowing.   Eyes:  Negative for visual disturbance.  Respiratory:  Positive for  chest tightness and shortness of breath. Negative for cough and wheezing.   Cardiovascular:  Positive for chest pain and palpitations. Negative for leg swelling.  Gastrointestinal:  Positive for nausea. Negative for abdominal pain, constipation, diarrhea and vomiting.  Genitourinary:  Negative for difficulty urinating.  Musculoskeletal:  Negative for arthralgias.  Neurological:  Positive for light-headedness. Negative for tremors and weakness.    Physical Exam Updated Vital Signs BP 122/80   Pulse 89   Temp 98.7 F (37.1 C) (Oral)   Resp 16   LMP 04/03/2018 (Within Days)   SpO2 99%  Physical Exam Constitutional:      General: She is not in acute distress.    Appearance: She is well-developed. She is obese. She is not ill-appearing.  HENT:     Head: Normocephalic and atraumatic.  Eyes:     Extraocular Movements: Extraocular movements intact.     Pupils: Pupils are equal, round, and reactive to light.  Cardiovascular:     Rate and Rhythm: Normal rate and regular rhythm.     Pulses:          Carotid pulses are 2+ on the right side and 2+ on the left side.      Radial pulses are 2+ on the right side and 2+ on the left side.       Dorsalis pedis pulses are 2+ on the right side and 2+ on the left side.  Posterior tibial pulses are 2+ on the right side and 2+ on the left side.     Heart sounds: Normal heart sounds.  Pulmonary:     Effort: Pulmonary effort is normal.     Breath sounds: Normal breath sounds.  Chest:     Chest wall: Tenderness (over the costochondral joints) present.  Abdominal:     General: Bowel sounds are normal. There is no abdominal bruit.     Palpations: Abdomen is soft.     Tenderness: There is no guarding.  Musculoskeletal:        General: Normal range of motion.     Cervical back: Normal range of motion and neck supple.  Skin:    General: Skin is warm and dry.     Capillary Refill: Capillary refill takes less than 2 seconds.  Neurological:      Mental Status: She is alert.     ED Results / Procedures / Treatments   Labs (all labs ordered are listed, but only abnormal results are displayed) Labs Reviewed  BASIC METABOLIC PANEL - Abnormal; Notable for the following components:      Result Value   Potassium 3.0 (*)    Glucose, Bld 125 (*)    All other components within normal limits  CBC - Abnormal; Notable for the following components:   HCT 35.3 (*)    All other components within normal limits  I-STAT BETA HCG BLOOD, ED ([facility], [facility], [facility] ONLY)  TROPONIN I (HIGH SENSITIVITY)  TROPONIN I (HIGH SENSITIVITY)    EKG EKG Interpretation  Date/Time:  Sunday June 20 2022 20:53:34 EDT Ventricular Rate:  106 PR Interval:  130 QRS Duration: 72 QT Interval:  322 QTC Calculation: 427 R Axis:   2 Text Interpretation: Sinus tachycardia ST & T wave abnormality, consider inferior ischemia Abnormal ECG When compared with ECG of 16-Jan-2022 15:55, Since last tracing rate faster Confirmed by Wandra Arthurs [phone]) on 06/20/2022 9:53:03 PM  Radiology DG Chest 2 View  Result Date: 06/20/2022 CLINICAL DATA:  Chest pain, shortness of breath, nausea. EXAM: CHEST - 2 VIEW COMPARISON:  None Available. FINDINGS: The heart size and mediastinal contours are within normal limits. No consolidation, effusion, or pneumothorax. Surgical clips project over the left chest. No acute osseous abnormality. IMPRESSION: No active cardiopulmonary disease. Electronically Signed   By: Brett Fairy M.D.   On: 06/20/2022 21:14    Procedures Procedures   Medications Ordered in ED Medications  potassium chloride SA (KLOR-CON M) CR tablet 40 mEq (has no administration in time range)    ED Course/ Medical Decision Making/ A&P                           Medical Decision Making Amount and/or Complexity of Data Reviewed Labs: ordered. Radiology: ordered.   Sheila [name] is a 51 y.o. female with a history of breast cancer (s/p lumpectomy and radiation therapy),  nonobstructive CAD, seizure like activity presents with chest pain. Physical examination with some signs of costochondritis but otherwise reassuring. Troponin evaluation was within normal limits and EKG largely unchanged from prior. CXR was negative for acute process.   Given patient's history of cancer and shortness of breath symptoms that began suddenly, felt CTA PE to be appropriate for evaluation. Will also obtain a repeat troponin to ensure no increasing trend.   Patient care was signed out to Dr. Sedonia Small. If the CTA chest and repeat troponin are negative, patient is  likely safe for discharge home with precautions and outpatient follow-up.   Final Clinical Impression(s) / ED Diagnoses Final diagnoses:  Chest pain, unspecified type     Rise Patience, DO 06/20/22 2315    Drenda Freeze, MD 06/24/22 1459

## 2022-06-20 NOTE — ED Triage Notes (Signed)
Pt via [facility], woke up around 2000, chest pressure extending to back of neck, associated SHOB, nausea. Mastectomy Aug 2022, hx L sided fluid build up, started taking prednisone yesterday. Pt states she's not been forthcoming w medical problems to family, is scared to be in the hospital.  22g R hand 324 ASA, 2 NTG  150/100 HR 112 100% RA

## 2022-06-20 NOTE — ED Provider Notes (Signed)
  Provider Note MRN:  [id number]  Arrival date & time: 06/21/22    ED Course and Medical Decision Making  Assumed care from Dr. Darl Householder at shift change.  Aortic chest pain, history of breast cancer awaiting CTA.  1 AM update: My assessment patient is resting comfortably, she has no symptoms at this time.  She suddenly woke up feeling short of breath and tight in the chest.  She says she snores, her doctors have had suspicion for sleep apnea in the past and so this could be the explanation.  The CTA is negative for PE or any other acute abnormality, labs are reassuring, she is appropriate for discharge.  Procedures  Final Clinical Impressions(s) / ED Diagnoses     ICD-10-CM   1. Chest pain, unspecified type  R07.9     2. SOB (shortness of breath)  R06.02       ED Discharge Orders     None         Discharge Instructions      You were evaluated in the Emergency Department and after careful evaluation, we did not find any emergent condition requiring admission or further testing in the hospital.  Your exam/testing today is overall reassuring.  No signs of blood clots or heart attack or any other emergencies.  Symptoms may be due to sleep apnea, recommend follow-up with your primary care doctor to discuss her symptoms  Please return to the Emergency Department if you experience any worsening of your condition.   Thank you for allowing [place] to be a part of your care.      Barth Kirks. Sedonia Small, [place] [email or web address]    Maudie Flakes, MD 06/21/22 [phone]

## 2022-06-21 NOTE — Discharge Instructions (Signed)
You were evaluated in the Emergency Department and after careful evaluation, we did not find any emergent condition requiring admission or further testing in the hospital.  Your exam/testing today is overall reassuring.  No signs of blood clots or heart attack or any other emergencies.  Symptoms may be due to sleep apnea, recommend follow-up with your primary care doctor to discuss her symptoms  Please return to the Emergency Department if you experience any worsening of your condition.   Thank you for allowing [place] to be a part of your care.

## 2022-06-21 NOTE — ED Notes (Signed)
Back from CT

## 2022-06-23 DIAGNOSIS — Z0271 Encounter for disability determination: Secondary | ICD-10-CM

## 2022-07-01 ENCOUNTER — Encounter: Payer: Self-pay | Admitting: Obstetrics and Gynecology

## 2022-07-05 ENCOUNTER — Ambulatory Visit (INDEPENDENT_AMBULATORY_CARE_PROVIDER_SITE_OTHER): Payer: Medicaid Other | Admitting: Podiatry

## 2022-07-05 DIAGNOSIS — M722 Plantar fascial fibromatosis: Secondary | ICD-10-CM

## 2022-07-05 MED ORDER — BETAMETHASONE SOD PHOS & ACET 6 (3-3) MG/ML IJ SUSP
3.0000 mg | Freq: Once | INTRAMUSCULAR | Status: AC
  Administered 2022-07-05: 3 mg via INTRA_ARTICULAR

## 2022-07-05 NOTE — Progress Notes (Signed)
Chief Complaint  Patient presents with   Plantar Fasciitis    Patient states the left heel is still hurting. She was unable to sleep last night due to the pain.   Subjective: 51 y.o. female presenting today for follow-up evaluation of left heel pain.  Patient continues to have severe heel pain left.  She says that previous treatments have been unsuccessful.  She presents for further treatment and evaluation   Past Medical History:  Diagnosis Date   Anemia    Anginal pain ([place])    Breast cancer ([place])    Family history of bone cancer    Family history of breast cancer    Family history of prostate cancer    Family history of throat cancer    Malignant neoplasm of upper-outer quadrant of left breast in female, estrogen receptor positive ([place]) 04/08/2021   Past Surgical History:  Procedure Laterality Date   AXILLARY LYMPH NODE BIOPSY Left 04/29/2021   Procedure: AXILLARY LYMPH NODE BIOPSY;  Surgeon: Rolm Bookbinder, MD;  Location: [place];  Service: General;  Laterality: Left;   BREAST LUMPECTOMY WITH RADIOACTIVE SEED AND SENTINEL LYMPH NODE BIOPSY Left 04/29/2021   Procedure: LEFT BREAST LUMPECTOMY WITH RADIOACTIVE SEED;  Surgeon: Rolm Bookbinder, MD;  Location: [place];  Service: General;  Laterality: Left;   CARDIAC CATHETERIZATION     LEFT HEART CATHETERIZATION WITH CORONARY ANGIOGRAM N/A 05/30/2014   Procedure: LEFT HEART CATHETERIZATION WITH CORONARY ANGIOGRAM;  Surgeon: Peter M Martinique, MD;  Location: [facility] CATH LAB;  Service: Cardiovascular;  Laterality: N/A;   TUBAL LIGATION     Allergies  Allergen Reactions   Anesthetics, Amide Other (See Comments)    seizures   Other Other (See Comments)    General Anesthesia caused seizures-Propofol     Objective: Physical Exam General: The patient is alert and oriented x3 in no acute distress.  Dermatology: Skin is warm, dry and supple bilateral lower extremities. Negative for open lesions or macerations bilateral.   Vascular:  Dorsalis Pedis and Posterior Tibial pulses palpable bilateral.  Capillary fill time is immediate to all digits.  Neurological: Epicritic and protective threshold intact bilateral.   Musculoskeletal: There continues to be tenderness to palpation to the plantar aspect of the left heel along the plantar fascia. All other joints range of motion within normal limits bilateral. Strength 5/5 in all groups bilateral.   Radiographic exam 05/03/2022 LT foot: Normal osseous mineralization. Joint spaces preserved. No fracture/dislocation/boney destruction. No other soft tissue abnormalities or radiopaque foreign bodies.   Assessment: 1. Plantar fasciitis left foot 2.  Plantar and posterior heel spur left  Plan of Care:  1. Patient evaluated. Xrays reviewed.   2.  Injection of 0.5 cc Celestone Soluspan injected along the plantar fascia 3.  Prescription for meloxicam 15 mg daily.  Patient states that she did not pick up the prescription last visit 4.  The heel pain is severe enough we are going to place the patient in the cam walker for 3-4 weeks.  Weightbearing as tolerated 5.  Night splint dispensed.  Wear nightly 6.  Return to clinic in 3 weeks  *Currently not working   Edrick Kins, DPM [facility]  Dr. Edrick Kins, DPM    [address],  [place] [address]                Office [phone]  Fax [phone]

## 2022-07-12 ENCOUNTER — Encounter: Payer: Self-pay | Admitting: Neurology

## 2022-07-12 ENCOUNTER — Ambulatory Visit (INDEPENDENT_AMBULATORY_CARE_PROVIDER_SITE_OTHER): Payer: Medicaid Other | Admitting: Neurology

## 2022-07-12 VITALS — BP 138/88 | HR 80 | Ht 69.0 in | Wt 213.0 lb

## 2022-07-12 DIAGNOSIS — F419 Anxiety disorder, unspecified: Secondary | ICD-10-CM | POA: Diagnosis not present

## 2022-07-12 DIAGNOSIS — F451 Undifferentiated somatoform disorder: Secondary | ICD-10-CM

## 2022-07-12 DIAGNOSIS — F430 Acute stress reaction: Secondary | ICD-10-CM

## 2022-07-12 MED ORDER — CITALOPRAM HYDROBROMIDE 20 MG PO TABS: 20.0000 mg | ORAL_TABLET | Freq: Every day | ORAL | 0 refills | Status: DC
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
## 2022-07-12 NOTE — Patient Instructions (Signed)
Start with Citalopram 20 mg daily  Referral to psychology, patient will benefit from individual therapy and family therapy  Return in 3 months

## 2022-07-13 ENCOUNTER — Ambulatory Visit: Payer: Medicaid Other | Admitting: Obstetrics and Gynecology

## 2022-07-14 ENCOUNTER — Telehealth: Payer: Self-pay | Admitting: Neurology

## 2022-07-14 NOTE — Telephone Encounter (Signed)
Referral for psychology sent to: [facility], Phone [phone]

## 2022-07-19 NOTE — Progress Notes (Deleted)
[place]   [phone] [phone]   Clinic Follow up Note   Patient Care Team: Griffin Basil, MD as PCP - General (Obstetrics and Gynecology) Elouise [name], MD as PCP - Cardiology (Cardiology) Elouise [name], MD as Consulting Physician (Cardiology) Mauro Kaufmann, RN as Oncology Nurse Navigator Rockwell Germany, RN as Oncology Nurse Navigator Rolm Bookbinder, MD as Consulting Physician (General Surgery) Truitt Merle, MD as Consulting Physician (Hematology) Kyung Rudd, MD as Consulting Physician (Radiation Oncology) Alla Feeling, NP as Nurse Practitioner (Nurse Practitioner) 07/19/2022  CHIEF COMPLAINT: Follow up left breast cancer   SUMMARY OF ONCOLOGIC HISTORY: Oncology History Overview Note  Cancer Staging Malignant neoplasm of upper-outer quadrant of left breast in female, estrogen receptor positive ([place]) Staging form: Breast, AJCC 8th Edition - Clinical stage from 04/03/2021: Stage IA (cT1c, cN0, cM0, G2, ER+, PR+, HER2-) - Signed by Truitt Merle, MD on 04/14/2021 Stage prefix: Initial diagnosis Histologic grading system: 3 grade system    Malignant neoplasm of upper-outer quadrant of left breast in female, estrogen receptor positive ([place])  03/31/2021 Mammogram   Targeted ultrasound is performed, demonstrating an irregular hypoechoic mass with angular margins at the 2 o'clock position approximately 5 cm nipple measuring approximately 1.4 x 0.8 x 1.0 cm, demonstrating posterior acoustic shadowing, associated with microcalcifications, corresponding to the screening mammographic finding.   Immediately adjacent to the dominant mass is a hypoechoic mass measuring 0.5 x 0.4 x 0.4 cm, demonstrating posterior acoustic enhancement and no internal power Doppler flow.   Sonographic evaluation of the LEFT axilla demonstrates no pathologic lymphadenopathy.   04/03/2021 Cancer Staging   Staging form: Breast, AJCC 8th Edition -  Clinical stage from 04/03/2021: Stage IA (cT1c, cN0, cM0, G2, ER+, PR+, HER2-) - Signed by Truitt Merle, MD on 04/14/2021 Stage prefix: Initial diagnosis Histologic grading system: 3 grade system   04/03/2021 Initial Biopsy   Diagnosis Breast, left, needle core biopsy, 2 o'clock - INVASIVE MAMMARY CARCINOMA - SEE COMMENT Microscopic Comment The biopsy material shows an infiltrative proliferation of cells with large vesicular nuclei with inconspicuous nucleoli, arranged linearly and in small clusters. Based on the biopsy, the carcinoma appears Nottingham grade 2 of 3 and measures 1.0 cm in greatest linear extent. E-cadherin and prognostic markers (ER/PR/ki-67/HER2)are pending and will be reported in an addendum. Dr. Jeannie Done reviewed the case and agrees with the above diagnosis. These results were called to [facility] on Apr 06, 2021.  E-cadherin is POSITIVE supporting a ductal origin.   04/03/2021 Receptors her2   PROGNOSTIC INDICATORS Results: IMMUNOHISTOCHEMICAL AND MORPHOMETRIC ANALYSIS PERFORMED MANUALLY The tumor cells are NEGATIVE for Her2 (1+). Estrogen Receptor: 70%, POSITIVE, STRONG STAINING INTENSITY Progesterone Receptor: >95%, POSITIVE, STRONG STAINING INTENSITY Proliferation Marker Ki67: 15%   04/05/2021 Imaging   CT AP  IMPRESSION: 1. No acute abnormality of the abdomen or pelvis. 2. Unchanged appearance of inferior right hepatic lobe lesion containing numerous calcifications.     04/08/2021 Initial Diagnosis   Malignant neoplasm of upper-outer quadrant of left breast in female, estrogen receptor positive ([place])   04/21/2021 Genetic Testing   Negative genetic testing:  No pathogenic variants detected on the [facility] BRCAplus panel (report date 04/21/2021) or CancerNext-Expanded + RNAinsight panel (report date 04/27/2021). 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   The BRCAplus panel offered by [employer] and includes sequencing and deletion/duplication analysis for the following 8 genes: ATM, BRCA1, BRCA2,  CDH1, CHEK2, PALB2, PTEN, and TP53. The CancerNext-Expanded + RNAinsight gene panel offered by [employer] and includes sequencing and rearrangement analysis for the following 77 genes: AIP, ALK, APC, ATM, AXIN2, BAP1, BARD1, BLM, BMPR1A, BRCA1, BRCA2, BRIP1, CDC73, CDH1, CDK4, CDKN1B, CDKN2A, CHEK2, CTNNA1, DICER1, FANCC, FH, FLCN, GALNT12, KIF1B, LZTR1, MAX, MEN1, MET, MLH1, MSH2, MSH3, MSH6, MUTYH, NBN, NF1, NF2, NTHL1, PALB2, PHOX2B, PMS2, POT1, PRKAR1A, PTCH1, PTEN, RAD51C, RAD51D, RB1, RECQL, RET, SDHA, SDHAF2, SDHB, SDHC, SDHD, SMAD4, SMARCA4, SMARCB1, SMARCE1, STK11, SUFU, TMEM127, TP53, TSC1, TSC2, VHL and XRCC2 (sequencing and deletion/duplication); EGFR, EGLN1, HOXB13, KIT, MITF, PDGFRA, POLD1 and POLE (sequencing only); EPCAM and GREM1 (deletion/duplication only). RNA data is routinely analyzed for use in variant interpretation for all genes.   04/29/2021 Surgery   FINAL MICROSCOPIC DIAGNOSIS:   A. BREAST, LEFT, LUMPECTOMY:  - Invasive and in situ ductal carcinoma, 1.6 cm.  - Invasive carcinoma 0.1 cm from the lateral margin.  - Biopsy site and biopsy clip.  - See oncology table.   B. BREAST, LEFT POSTERIOR MARGIN, EXCISION:  - Fibrocystic changes with usual ductal hyperplasia.  - Final posterior margin negative for carcinoma.   C. BREAST, LEFT SUPERIOR MARGIN, EXCISION:  - Benign breast tissue.  - Final superior margin negative for carcinoma.   D. LYMPH NODE, LEFT AXILLARY, SENTINEL, EXCISION:  - One lymph node negative for metastatic carcinoma (0/1).   E. BREAST, LEFT MEDIAL MARGIN, EXCISION:  - Benign breast tissue.  - Final medial margin negative for carcinoma.    04/29/2021 Oncotype testing   Recurrence score of 19, distant recurrence risk of 6%, no benefit from chemotherapy   04/29/2021 Cancer Staging   Staging form: Breast, AJCC 8th Edition - Pathologic  stage from 04/29/2021: Stage IA (pT1c, pN0, cM0, G2, ER+, PR+, HER2-, Oncotype DX score: 19) - Signed by Alla Feeling, NP on 09/30/2021 Stage prefix: Initial diagnosis Nuclear grade: G2 Multigene prognostic tests performed: Oncotype DX Recurrence score range: Greater than or equal to 11 Histologic grading system: 3 grade system   09/30/2021 Survivorship   SCP delivered by Cira Rue, NP     CURRENT THERAPY: Exemestane, starting 04/2022  INTERVAL HISTORY: Ms. Sheila [name] returns for follow up as scheduled. Last seen by Dr. Burr Medico 05/07/22. She had an episode 06/20/22 woke up short of breath with chest tightness. Went to ED, labs reassuring, CTA negative for PE. She was encouraged to complete sleep study which had been recommended in the past for suspicion for sleep apnea. She was seen in neurology f/up reporting severe headaches and "blackouts." Brain/head imaging has been negative in the past. 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    REVIEW OF SYSTEMS:   Constitutional: Denies fevers, chills or abnormal weight loss Eyes: Denies blurriness of vision Ears, nose, mouth, throat, and face: Denies mucositis or sore throat Respiratory: Denies cough, dyspnea or wheezes Cardiovascular: Denies palpitation, chest discomfort or lower extremity swelling Gastrointestinal:  Denies nausea, heartburn or change in bowel habits Skin: Denies abnormal skin rashes Lymphatics: Denies new lymphadenopathy or easy bruising Neurological:Denies numbness, tingling or new weaknesses Behavioral/Psych: Mood is stable, no new changes  All other systems were reviewed with the patient and are negative.  MEDICAL HISTORY:  Past Medical History:  Diagnosis Date   Anemia    Anginal pain ([place])    Breast cancer ([place])    Family history of bone cancer    Family history of breast cancer  Family history of prostate cancer    Family history of throat cancer     Malignant neoplasm of upper-outer quadrant of left breast in female, estrogen receptor positive ([place]) 04/08/2021    SURGICAL HISTORY: Past Surgical History:  Procedure Laterality Date   AXILLARY LYMPH NODE BIOPSY Left 04/29/2021   Procedure: AXILLARY LYMPH NODE BIOPSY;  Surgeon: Rolm Bookbinder, MD;  Location: [place];  Service: General;  Laterality: Left;   BREAST LUMPECTOMY WITH RADIOACTIVE SEED AND SENTINEL LYMPH NODE BIOPSY Left 04/29/2021   Procedure: LEFT BREAST LUMPECTOMY WITH RADIOACTIVE SEED;  Surgeon: Rolm Bookbinder, MD;  Location: [place];  Service: General;  Laterality: Left;   CARDIAC CATHETERIZATION     LEFT HEART CATHETERIZATION WITH CORONARY ANGIOGRAM N/A 05/30/2014   Procedure: LEFT HEART CATHETERIZATION WITH CORONARY ANGIOGRAM;  Surgeon: Peter M Martinique, MD;  [facility] C[facility] LAB;  Service: Cardiovascular;  Laterality: N/A;   TUBAL LIGATION      I have reviewed the social history and family history with the patient and they are unchanged from previous note.  ALLERGIES:  is allergic to anesthetics, amide and other.  MEDICATIONS:  Current Outpatient Medications  Medication Sig Dispense Refill   betamethasone valerate ointment (VALISONE) 0.1 % Apply topically 2 (two) times daily.     citalopram (CELEXA) 20 MG tablet Take 1 tablet (20 mg total) by mouth daily. 90 tablet 0   doxycycline (VIBRAMYCIN) 100 MG capsule Take 100 mg by mouth 2 (two) times daily.     exemestane (AROMASIN) 25 MG tablet Take 1 tablet (25 mg total) by mouth daily after breakfast. 30 tablet 3   meloxicam (MOBIC) 15 MG tablet Take 1 tablet (15 mg total) by mouth daily. 30 tablet 1   No current facility-administered medications for this visit.    PHYSICAL EXAMINATION: ECOG PERFORMANCE STATUS: {CHL ONC ECOG [phone]}  There were no vitals filed for this visit. There were no vitals filed for this visit.  GENERAL:alert, no distress and comfortable SKIN: skin color, texture, turgor are normal,  no rashes or significant lesions EYES: normal, Conjunctiva are pink and non-injected, sclera clear OROPHARYNX:no exudate, no erythema and lips, buccal mucosa, and tongue normal  NECK: supple, thyroid normal size, non-tender, without nodularity LYMPH:  no palpable lymphadenopathy in the cervical, axillary or inguinal LUNGS: clear to auscultation and percussion with normal breathing effort HEART: regular rate & rhythm and no murmurs and no lower extremity edema ABDOMEN:abdomen soft, non-tender and normal bowel sounds Musculoskeletal:no cyanosis of digits and no clubbing  NEURO: alert & oriented x 3 with fluent speech, no focal motor/sensory deficits  LABORATORY DATA:  I have reviewed the data as listed    Latest Ref Rng & Units 06/20/2022    8:56 PM 05/07/2022    3:38 PM 04/03/2022    1:23 PM  CBC  WBC 4.0 - 10.5 K/uL 8.6  3.8  3.6   Hemoglobin 12.0 - 15.0 g/dL 12.0  12.5  12.5   Hematocrit 36.0 - 46.0 % 35.3  37.1  37.5   Platelets 150 - 400 K/uL 284  277  267         Latest Ref Rng & Units 06/20/2022    8:56 PM 05/07/2022    3:38 PM 01/16/2022    4:18 PM  CMP  Glucose 70 - 99 mg/dL 125  91  132   BUN 6 - 20 mg/dL _0 Creatinine 0.44 - 1.00 mg/dL 0.99  0.95  0.88  Sodium 135 - 145 mmol/L 142  141  140   Potassium 3.5 - 5.1 mmol/L 3.0  3.9  3.6   Chloride 98 - 111 mmol/L 111  108  108   CO2 22 - 32 mmol/L _0 Calcium 8.9 - 10.3 mg/dL 9.2  9.7  9.1   Total Protein 6.5 - 8.1 g/dL  7.5  7.9   Total Bilirubin 0.3 - 1.2 mg/dL  0.9  0.9   Alkaline Phos 38 - 126 U/L  60  52   AST 15 - 41 U/L  19  28   ALT 0 - 44 U/L  18  21       RADIOGRAPHIC STUDIES: I have personally reviewed the radiological images as listed and agreed with the findings in the report. No results found.   ASSESSMENT & PLAN:  No problem-specific Assessment & Plan notes found for this encounter.   No orders of the defined types were placed in this encounter.  All questions were answered.  The patient knows to call the clinic with any problems, questions or concerns. No barriers to learning was detected. I spent {CHL ONC TIME VISIT - ERXVQ:[id number]} counseling the patient face to face. The total time spent in the appointment was {CHL ONC TIME VISIT - DTOIZ:[id number]} and more than 50% was on counseling and review of test results     Alla Feeling, NP 07/19/22

## 2022-07-20 ENCOUNTER — Inpatient Hospital Stay: Payer: Medicaid Other | Attending: Hematology | Admitting: Nurse Practitioner

## 2022-07-20 ENCOUNTER — Inpatient Hospital Stay: Payer: Medicaid Other

## 2022-07-26 ENCOUNTER — Ambulatory Visit: Payer: Medicaid Other | Admitting: Podiatry

## 2022-08-12 ENCOUNTER — Ambulatory Visit: Payer: Medicaid Other | Admitting: Obstetrics and Gynecology

## 2022-08-12 ENCOUNTER — Other Ambulatory Visit: Payer: Self-pay | Admitting: General Surgery

## 2022-08-12 DIAGNOSIS — N644 Mastodynia: Secondary | ICD-10-CM

## 2022-08-23 ENCOUNTER — Ambulatory Visit: Admission: RE | Admit: 2022-08-23 | Payer: Medicaid Other | Source: Ambulatory Visit

## 2022-08-23 ENCOUNTER — Ambulatory Visit
Admission: RE | Admit: 2022-08-23 | Discharge: 2022-08-23 | Disposition: A | Discharge status: Home / Self Care | Payer: Medicaid Other | Source: Ambulatory Visit | Attending: General Surgery | Admitting: General Surgery

## 2022-08-23 DIAGNOSIS — N644 Mastodynia: Secondary | ICD-10-CM

## 2022-08-25 ENCOUNTER — Ambulatory Visit: Payer: Medicaid Other | Attending: General Surgery | Admitting: Physical Therapy

## 2022-08-25 ENCOUNTER — Other Ambulatory Visit: Payer: Self-pay

## 2022-08-25 ENCOUNTER — Encounter: Payer: Self-pay | Admitting: Physical Therapy

## 2022-08-25 DIAGNOSIS — R600 Localized edema: Secondary | ICD-10-CM | POA: Diagnosis not present

## 2022-08-25 DIAGNOSIS — M25611 Stiffness of right shoulder, not elsewhere classified: Secondary | ICD-10-CM | POA: Diagnosis not present

## 2022-08-25 DIAGNOSIS — R293 Abnormal posture: Secondary | ICD-10-CM

## 2022-08-25 DIAGNOSIS — M25612 Stiffness of left shoulder, not elsewhere classified: Secondary | ICD-10-CM

## 2022-08-25 DIAGNOSIS — M79601 Pain in right arm: Secondary | ICD-10-CM

## 2022-08-25 DIAGNOSIS — C50412 Malignant neoplasm of upper-outer quadrant of left female breast: Secondary | ICD-10-CM | POA: Diagnosis present

## 2022-08-25 DIAGNOSIS — Z170 Estrogen receptor positive status [ER+]: Secondary | ICD-10-CM | POA: Diagnosis not present

## 2022-08-25 NOTE — Therapy (Signed)
OUTPATIENT PHYSICAL THERAPY ONCOLOGY EVALUATION  Patient Name: Sheila [name] MRN: [id number] DOB:[birth year], 51 y.o., female Today's Date: 08/25/2022   PT End of Session - 08/25/22 1258     Visit Number 1    Number of Visits 9    Date for PT Re-Evaluation 09/22/22    Authorization Type [employer]    PT Start Time 1210    PT Stop Time 1257    PT Time Calculation (min) 47 min    Activity Tolerance Patient tolerated treatment well    Behavior During Therapy WFL for tasks assessed/performed             Past Medical History:  Diagnosis Date   Anemia    Anginal pain ([place])    Breast cancer ([place])    Family history of bone cancer    Family history of breast cancer    Family history of prostate cancer    Family history of throat cancer    Malignant neoplasm of upper-outer quadrant of left breast in female, estrogen receptor positive ([place]) 04/08/2021   Past Surgical History:  Procedure Laterality Date   AXILLARY LYMPH NODE BIOPSY Left 04/29/2021   Procedure: AXILLARY LYMPH NODE BIOPSY;  Surgeon: Rolm Bookbinder, MD;  Location: [place];  Service: General;  Laterality: Left;   BREAST LUMPECTOMY WITH RADIOACTIVE SEED AND SENTINEL LYMPH NODE BIOPSY Left 04/29/2021   Procedure: LEFT BREAST LUMPECTOMY WITH RADIOACTIVE SEED;  Surgeon: Rolm Bookbinder, MD;  Location: [place];  Service: General;  Laterality: Left;   CARDIAC CATHETERIZATION     LEFT HEART CATHETERIZATION WITH CORONARY ANGIOGRAM N/A 05/30/2014   Procedure: LEFT HEART CATHETERIZATION WITH CORONARY ANGIOGRAM;  Surgeon: Peter M Martinique, MD;  Location: [facility] CATH LAB;  Service: Cardiovascular;  Laterality: N/A;   TUBAL LIGATION     Patient Active Problem List   Diagnosis Date Noted   Seizure-like activity ([place]) 08/09/2021   Postmenopausal bleeding 06/02/2021   Genetic testing 04/21/2021   Family history of breast cancer    Family history of bone cancer    Family history of prostate cancer    Family history of  throat cancer    Malignant neoplasm of upper-outer quadrant of left breast in female, estrogen receptor positive ([place]) 04/08/2021   Women's annual routine gynecological examination 02/25/2021   Screening examination for STD (sexually transmitted disease) 02/25/2021   Skin lesion 02/25/2021   Chest pain at rest 05/30/2014   Ejection fraction    Abnormal EKG 04/24/2014   Chest discomfort 04/23/2014   Panic attack 04/23/2014   OBESITY 01/15/2009   HAIR LOSS 01/15/2009   ANEMIA-NOS 01/14/2009    PCP: Lynnda Shields, MD  REFERRING PROVIDER: Rolm Bookbinder, MD  REFERRING DIAG: [phone] (ICD-10-CM) - Malignant neoplasm of upper-outer quadrant of left female breast Z17.0 (ICD-10-CM) - Estrogen receptor positive status (ER+)    THERAPY DIAG:  Stiffness of left shoulder, not elsewhere classified  Stiffness of right shoulder, not elsewhere classified  Pain in right arm  Localized edema  Abnormal posture  Malignant neoplasm of upper-outer quadrant of left breast in female, estrogen receptor positive ([place])  ONSET DATE: 04/29/21  Rationale for Evaluation and Treatment Rehabilitation  SUBJECTIVE  SUBJECTIVE STATEMENT: I am having pain where they took the lymph nodes out. I have swelling and it hurts me to move my arm. I can not use my R hand at all. I can not pick up anything or open anything. Pt reports when she had her colonoscopy something when wrong and she began having seizures and has weakness in her R hand.  PERTINENT HISTORY:  Patient was diagnosed on 03/17/2021 with left grade II invasive ductal carcinoma breast cancer. She underwent a left lumpectomy and sentinel node biopsy (1 negative node) on 04/29/2021 It is ER/PR positive and HER2 negative with a Ki67 of 15%.  PAIN:  Are you having pain?  Yes NPRS scale: 5/10 Pain location: L axilla and lateral trunk Pain orientation: Left  PAIN TYPE: aching Pain description: constant  Aggravating factors: laying on the L side Relieving factors: pillow under the arm  PRECAUTIONS: Other: pt has hx of seizures and panic attacks  WEIGHT BEARING RESTRICTIONS No  FALLS:  Has patient fallen in last 6 months? Yes. Number of falls 2 pt report she falls when she blacks out  LIVING ENVIRONMENT: Lives with: lives with their spouse and lives with their son Lives in: House/apartment Stairs: Yes; External: 4 steps; none Has following equipment at home: Environmental consultant - 2 wheeled  OCCUPATION: currently unemployed  LEISURE: pt reports she has not been exercising but she is unable to stay awake to exercise  HAND DOMINANCE : right   PRIOR LEVEL OF FUNCTION: Needs assistance with homemaking  PATIENT GOALS decrease swelling and pain, and eventually improve strength   OBJECTIVE  COGNITION:  Overall cognitive status:  pt reports she has memory issues since her seizures     PALPATION: Some fullness palpable in L lateral chest   POSTURE: forward head and rounded shoulders  UPPER EXTREMITY AROM/PROM:  A/PROM RIGHT   eval   Shoulder extension 40 with pain  Shoulder flexion 102 with pain  Shoulder abduction 74 with pain  Shoulder internal rotation 31 with pain  Shoulder external rotation 15 with pain    (Blank rows = not tested)  Lift off test- unable Empty can - p!   A/PROM LEFT   eval  Shoulder extension 30  Shoulder flexion 131  Shoulder abduction 98  Shoulder internal rotation 53  Shoulder external rotation 86    (Blank rows = not tested)   LYMPHEDEMA ASSESSMENTS:   SURGERY TYPE/DATE: L lumpectomy and SLNB 04/29/21  NUMBER OF LYMPH NODES REMOVED: 0/1  CHEMOTHERAPY: did not require  RADIATION:completed  INFECTIONS: cellulitis per pt a few months ago  LYMPHEDEMA ASSESSMENTS:   LANDMARK RIGHT  eval  10 cm proximal to  olecranon process 29.6  Olecranon process 25.8  10 cm proximal to ulnar styloid process 20.6  Just proximal to ulnar styloid process 13.4  Across hand at thumb web space 17  At base of 2nd digit 4  (Blank rows = not tested)  LANDMARK LEFT  eval  10 cm proximal to olecranon process 31.5  Olecranon process 26  10 cm proximal to ulnar styloid process 19.9  Just proximal to ulnar styloid process 13.3  Across hand at thumb web space 16.7  At base of 2nd digit 3.9  (Blank rows = not tested)   L-DEX LYMPHEDEMA SCREENING:  The patient was assessed using the L-Dex machine today to produce a lymphedema index baseline score. The patient will be reassessed on a regular basis (typically every 3 months) to obtain new L-Dex scores. If the score  is > 6.5 points away from his/her baseline score indicating onset of subclinical lymphedema, it will be recommended to wear a compression garment for 4 weeks, 12 hours per day and then be reassessed. If the score continues to be > 6.5 points from baseline at reassessment, we will initiate lymphedema treatment. Assessing in this manner has a 95% rate of preventing clinically significant lymphedema.   L-DEX FLOWSHEETS - 08/25/22 1200       L-DEX LYMPHEDEMA SCREENING   Measurement Type Unilateral    L-DEX MEASUREMENT EXTREMITY Upper Extremity    POSITION  Standing    DOMINANT SIDE Right    At Risk Side Left    BASELINE SCORE (UNILATERAL) 0    L-DEX SCORE (UNILATERAL) 1.8    VALUE CHANGE (UNILAT) 1.8              QUICK DASH SURVEY:   Katina Dung - 08/25/22 0001     Open a tight or new jar Severe difficulty    Do heavy household chores (wash walls, wash floors) Severe difficulty    Carry a shopping bag or briefcase Severe difficulty    Wash your back Severe difficulty    Use a knife to cut food Severe difficulty    Recreational activities in which you take some force or impact through your arm, shoulder, or hand (golf, hammering, tennis) Severe  difficulty    During the past week, to what extent has your arm, shoulder or hand problem interfered with your normal social activities with family, friends, neighbors, or groups? Quite a bit    During the past week, to what extent has your arm, shoulder or hand problem limited your work or other regular daily activities Quite a bit    Arm, shoulder, or hand pain. Severe    Tingling (pins and needles) in your arm, shoulder, or hand Severe    Difficulty Sleeping Moderate difficulty    DASH Score 72.73 %               TODAY'S TREATMENT  08/25/22- educated pt in post op stretches   PATIENT EDUCATION:  Education details: importance of slowly building endurance through a walking program, post op breast exercises.  Person educated: Patient Education method: Theatre stage manager Education comprehension: verbalized understanding   HOME EXERCISE PROGRAM: Post op breast exercises  ASSESSMENT:  CLINICAL IMPRESSION: Patient is a 51 y.o. female who was seen today for physical therapy evaluation and treatment for decrease bilateral shoulder ROM, L lateral trunk edema and pain. Pt reports that last year she underwent a colonoscopy and there were complications that led to her having seizures and since that time she has had decreased ROM and pain in her shoulders. She also has decreased endurance and difficulty walking. Due to numerous issues will prioritize the upper body first and pt would benefit from skilled PT services to improve bilateral shoulder ROM, decrease pain, decrease edema and once pt is able to meet those goals she would benefit from PT for LE strengthening and balance. LE strength and balance will be assessed at that time.     OBJECTIVE IMPAIRMENTS decreased ROM, decreased strength, increased edema, increased fascial restrictions, impaired UE functional use, postural dysfunction, and pain.   ACTIVITY LIMITATIONS carrying, lifting, sleeping, and reach over  head  PARTICIPATION LIMITATIONS: meal prep, cleaning, laundry, driving, community activity, occupation, and yard work  PERSONAL FACTORS Fitness, Past/current experiences, and Time since onset of injury/illness/exacerbation are also affecting patient's functional outcome.   REHAB POTENTIAL:  Good  CLINICAL DECISION MAKING: Stable/uncomplicated  EVALUATION COMPLEXITY: Low  GOALS: Goals reviewed with patient? Yes  SHORT TERM GOALS=LONG TERM GOALS  Target date: 09/22/2022    Pt will demonstrate 150 degrees of bilateral shoulder flexion to allow her to reach overhead.  Baseline:R 102, L 131 Goal status: INITIAL  2.  Pt will demonstrate 130 degrees of bilateral shoulder abduction to allow her to reach out to the side.  Baseline: R 74 L 98 Goal status: INITIAL  3.  Pt will be independent in a home exercise program for long term stretching and strengthening.  Baseline:  Goal status: INITIAL  4.  Pt will report she no longer feels like she has something under her left arm when she places it by her side to improve comfort.  Baseline:  Goal status: INITIAL  5.  Pt will report she is able to sleep through the night on either side with decreased pain by 50%.  Baseline:  Goal status: INITIAL   PLAN: PT FREQUENCY: 2x/week  PT DURATION: 4 weeks  PLANNED INTERVENTIONS: Therapeutic exercises, Therapeutic activity, Patient/Family education, Self Care, Joint mobilization, Orthotic/Fit training, Manual lymph drainage, Compression bandaging, scar mobilization, Taping, Vasopneumatic device, Manual therapy, and Re-evaluation  PLAN FOR NEXT SESSION: PROM to bilateral shoulders, AAROM to bilateral shoulders, MLD to L lateral trunk, MFR to L axilla - may need to refer to ortho for R shoulder   Manus Gunning, PT 08/25/2022, 1:13 PM

## 2022-09-06 ENCOUNTER — Ambulatory Visit: Payer: Medicaid Other | Admitting: Physical Therapy

## 2022-09-09 ENCOUNTER — Ambulatory Visit: Payer: Medicaid Other | Admitting: Physical Therapy

## 2022-09-09 ENCOUNTER — Encounter: Payer: Self-pay | Admitting: Physical Therapy

## 2022-09-09 DIAGNOSIS — M25611 Stiffness of right shoulder, not elsewhere classified: Secondary | ICD-10-CM

## 2022-09-09 DIAGNOSIS — R600 Localized edema: Secondary | ICD-10-CM

## 2022-09-09 DIAGNOSIS — R293 Abnormal posture: Secondary | ICD-10-CM

## 2022-09-09 DIAGNOSIS — Z170 Estrogen receptor positive status [ER+]: Secondary | ICD-10-CM

## 2022-09-09 DIAGNOSIS — M25612 Stiffness of left shoulder, not elsewhere classified: Secondary | ICD-10-CM

## 2022-09-09 DIAGNOSIS — C50412 Malignant neoplasm of upper-outer quadrant of left female breast: Secondary | ICD-10-CM | POA: Diagnosis not present

## 2022-09-09 DIAGNOSIS — M79601 Pain in right arm: Secondary | ICD-10-CM

## 2022-09-09 NOTE — Therapy (Signed)
OUTPATIENT PHYSICAL THERAPY ONCOLOGY TREATMENT  Patient Name: Sheila [name] MRN: [id number] DOB:01/06/1971, 51 y.o., female Today's Date: 09/09/2022   PT End of Session - 09/09/22 1423     Visit Number 2    Number of Visits 9    Date for PT Re-Evaluation 09/22/22    Authorization Type [employer]    PT Start Time 0223   pt arrived late   PT Stop Time 3612    PT Time Calculation (min) 32 min    Activity Tolerance Patient tolerated treatment well    Behavior During Therapy WFL for tasks assessed/performed             Past Medical History:  Diagnosis Date   Anemia    Anginal pain ([place])    Breast cancer ([place])    Family history of bone cancer    Family history of breast cancer    Family history of prostate cancer    Family history of throat cancer    Malignant neoplasm of upper-outer quadrant of left breast in female, estrogen receptor positive ([place]) 04/08/2021   Past Surgical History:  Procedure Laterality Date   AXILLARY LYMPH NODE BIOPSY Left 04/29/2021   Procedure: AXILLARY LYMPH NODE BIOPSY;  Surgeon: Rolm Bookbinder, MD;  Location: [facility];  Service: General;  Laterality: Left;   BREAST LUMPECTOMY WITH RADIOACTIVE SEED AND SENTINEL LYMPH NODE BIOPSY Left 04/29/2021   Procedure: LEFT BREAST LUMPECTOMY WITH RADIOACTIVE SEED;  Surgeon: Rolm Bookbinder, MD;  Location: [facility];  Service: General;  Laterality: Left;   CARDIAC CATHETERIZATION     LEFT HEART CATHETERIZATION WITH CORONARY ANGIOGRAM N/A 05/30/2014   Procedure: LEFT HEART CATHETERIZATION WITH CORONARY ANGIOGRAM;  Surgeon: Peter M Martinique, MD;  Location: [facility] CATH LAB;  Service: Cardiovascular;  Laterality: N/A;   TUBAL LIGATION     Patient Active Problem List   Diagnosis Date Noted   Seizure-like activity ([place]) 08/09/2021   Postmenopausal bleeding 06/02/2021   Genetic testing 04/21/2021   Family history of breast cancer    Family history of bone cancer    Family history of prostate cancer     Family history of throat cancer    Malignant neoplasm of upper-outer quadrant of left breast in female, estrogen receptor positive ([place]) 04/08/2021   Women's annual routine gynecological examination 02/25/2021   Screening examination for STD (sexually transmitted disease) 02/25/2021   Skin lesion 02/25/2021   Chest pain at rest 05/30/2014   Ejection fraction    Abnormal EKG 04/24/2014   Chest discomfort 04/23/2014   Panic attack 04/23/2014   OBESITY 01/15/2009   HAIR LOSS 01/15/2009   ANEMIA-NOS 01/14/2009    PCP: Lynnda Shields, MD  REFERRING PROVIDER: Rolm Bookbinder, MD  REFERRING DIAG: [phone] (ICD-10-CM) - Malignant neoplasm of upper-outer quadrant of left female breast Z17.0 (ICD-10-CM) - Estrogen receptor positive status (ER+)    THERAPY DIAG:  Stiffness of left shoulder, not elsewhere classified  Stiffness of right shoulder, not elsewhere classified  Pain in right arm  Localized edema  Abnormal posture  Malignant neoplasm of upper-outer quadrant of left breast in female, estrogen receptor positive ([place])  ONSET DATE: 04/29/21  Rationale for Evaluation and Treatment Rehabilitation  SUBJECTIVE  SUBJECTIVE STATEMENT: I am really achy today. PERTINENT HISTORY:  Patient was diagnosed on 03/17/2021 with left grade II invasive ductal carcinoma breast cancer. She underwent a left lumpectomy and sentinel node biopsy (1 negative node) on 04/29/2021 It is ER/PR positive and HER2 negative with a Ki67 of 15%.  PAIN:  Are you having pain? Yes NPRS scale: 9/10 Pain location: L shoulder and back Pain orientation: Left  PAIN TYPE: aching Pain description: constant  Aggravating factors:being on her feet too much; sitting a long time Relieving factors: bath with epsom salt  PRECAUTIONS: Other:  pt has hx of seizures and panic attacks  WEIGHT BEARING RESTRICTIONS No  FALLS:  Has patient fallen in last 6 months? Yes. Number of falls 2 pt report she falls when she blacks out  LIVING ENVIRONMENT: Lives with: lives with their spouse and lives with their son Lives in: House/apartment Stairs: Yes; External: 4 steps; none Has following equipment at home: Environmental consultant - 2 wheeled  OCCUPATION: currently unemployed  LEISURE: pt reports she has not been exercising but she is unable to stay awake to exercise  HAND DOMINANCE : right   PRIOR LEVEL OF FUNCTION: Needs assistance with homemaking  PATIENT GOALS decrease swelling and pain, and eventually improve strength   OBJECTIVE  COGNITION:  Overall cognitive status:  pt reports she has memory issues since her seizures     PALPATION: Some fullness palpable in L lateral chest   POSTURE: forward head and rounded shoulders  UPPER EXTREMITY AROM/PROM:  A/PROM RIGHT   eval   Shoulder extension 40 with pain  Shoulder flexion 102 with pain  Shoulder abduction 74 with pain  Shoulder internal rotation 31 with pain  Shoulder external rotation 15 with pain    (Blank rows = not tested)  Lift off test- unable Empty can - p!   A/PROM LEFT   eval  Shoulder extension 30  Shoulder flexion 131  Shoulder abduction 98  Shoulder internal rotation 53  Shoulder external rotation 86    (Blank rows = not tested)   LYMPHEDEMA ASSESSMENTS:   SURGERY TYPE/DATE: L lumpectomy and SLNB 04/29/21  NUMBER OF LYMPH NODES REMOVED: 0/1  CHEMOTHERAPY: did not require  RADIATION:completed  INFECTIONS: cellulitis per pt a few months ago  LYMPHEDEMA ASSESSMENTS:   LANDMARK RIGHT  eval  10 cm proximal to olecranon process 29.6  Olecranon process 25.8  10 cm proximal to ulnar styloid process 20.6  Just proximal to ulnar styloid process 13.4  Across hand at thumb web space 17  At base of 2nd digit 4  (Blank rows = not tested)  LANDMARK LEFT   eval  10 cm proximal to olecranon process 31.5  Olecranon process 26  10 cm proximal to ulnar styloid process 19.9  Just proximal to ulnar styloid process 13.3  Across hand at thumb web space 16.7  At base of 2nd digit 3.9  (Blank rows = not tested)   L-DEX LYMPHEDEMA SCREENING:  The patient was assessed using the L-Dex machine today to produce a lymphedema index baseline score. The patient will be reassessed on a regular basis (typically every 3 months) to obtain new L-Dex scores. If the score is > 6.5 points away from his/her baseline score indicating onset of subclinical lymphedema, it will be recommended to wear a compression garment for 4 weeks, 12 hours per day and then be reassessed. If the score continues to be > 6.5 points from baseline at reassessment, we will initiate lymphedema treatment. Assessing in this  manner has a 95% rate of preventing clinically significant lymphedema.      QUICK DASH SURVEY:       TODAY'S TREATMENT  09/09/22- Began PROM to R shoulder within pt's tolerance. Pt very painful and guarded initially but this did ease with manual therapy. Educated pt about importance of movement for the joint. MFR and STM to R upper arm to help decrease discomfort. Scapular mobilizations in L s/l to R scapula in direction of protraction and retraction with pt reporting improvements in pain with this. Nerve gliding with R UE in various degrees of abduction with wrist flexion and extension with pt very tight at first but improved. STM to R scapular muscles and edge of lats where there was increased muscle tightness.   08/25/22- educated pt in post op stretches    PATIENT EDUCATION:  Education details: importance of slowly building endurance through a walking program, post op breast exercises.  Person educated: Patient Education method: Theatre stage manager Education comprehension: verbalized understanding   HOME EXERCISE PROGRAM: Post op breast  exercises  ASSESSMENT:  CLINICAL IMPRESSION: Pt tolerated the manual therapy well today. She was very painful initially but did improve with treatment today. By end of session she stated her shoulder was feeling better.  Encouraged pt to work on AROM at home and nerve gliding technique to help improve comfort.    OBJECTIVE IMPAIRMENTS decreased ROM, decreased strength, increased edema, increased fascial restrictions, impaired UE functional use, postural dysfunction, and pain.   ACTIVITY LIMITATIONS carrying, lifting, sleeping, and reach over head  PARTICIPATION LIMITATIONS: meal prep, cleaning, laundry, driving, community activity, occupation, and yard work  PERSONAL FACTORS Fitness, Past/current experiences, and Time since onset of injury/illness/exacerbation are also affecting patient's functional outcome.   REHAB POTENTIAL: Good  CLINICAL DECISION MAKING: Stable/uncomplicated  EVALUATION COMPLEXITY: Low  GOALS: Goals reviewed with patient? Yes  SHORT TERM GOALS=LONG TERM GOALS  Target date: 10/07/2022    Pt will demonstrate 150 degrees of bilateral shoulder flexion to allow her to reach overhead.  Baseline:R 102, L 131 Goal status: INITIAL  2.  Pt will demonstrate 130 degrees of bilateral shoulder abduction to allow her to reach out to the side.  Baseline: R 74 L 98 Goal status: INITIAL  3.  Pt will be independent in a home exercise program for long term stretching and strengthening.  Baseline:  Goal status: INITIAL  4.  Pt will report she no longer feels like she has something under her left arm when she places it by her side to improve comfort.  Baseline:  Goal status: INITIAL  5.  Pt will report she is able to sleep through the night on either side with decreased pain by 50%.  Baseline:  Goal status: INITIAL   PLAN: PT FREQUENCY: 2x/week  PT DURATION: 4 weeks  PLANNED INTERVENTIONS: Therapeutic exercises, Therapeutic activity, Patient/Family education, Self  Care, Joint mobilization, Orthotic/Fit training, Manual lymph drainage, Compression bandaging, scar mobilization, Taping, Vasopneumatic device, Manual therapy, and Re-evaluation  PLAN FOR NEXT SESSION: PROM to bilateral shoulders, AAROM to bilateral shoulders, MLD to L lateral trunk, MFR to L axilla - may need to refer to ortho for R shoulder   Manus Gunning, PT 09/09/2022, 3:04 PM

## 2022-09-13 ENCOUNTER — Telehealth (INDEPENDENT_AMBULATORY_CARE_PROVIDER_SITE_OTHER): Payer: Medicaid Other | Admitting: Primary Care

## 2022-09-13 NOTE — Progress Notes (Deleted)
[facility] Telephone Note  I connected with Sheila [name], on 09/13/2022 at 1:46 PM telephone and verified that I am speaking with the correct person using two identifiers.   Consent: I discussed the limitations, risks, security and privacy concerns of performing an evaluation and management service by telephone and the availability of in person appointments. I also discussed with the patient that there may be a patient responsible charge related to this service. The patient expressed understanding and agreed to proceed.   Location of Patient: Home  Location of Provider: [facility]   Persons participating in Telemedicine visit: Sharisse Rantz Juluis Mire,  NP  History of Present Illness: Sheila [name] is a 51 year old female establishing care and requesting referral to for a sleep apnea test.  Patient has concerns with moderate to severe snoring, sleeping all the time, fatigue And she could be doing something and follow-up to sleep while trying to do a task.  She also states that her memory has been impaired.  Easily to forget things, people, or places.  Then patient states she went for a colonoscopy woke up in the hospital.  Since that event she has been in physical therapy for weakness right arm and left foot causing unstable gait.- uses a walker .Patient has No headache, No chest pain, No abdominal pain - No Nausea, No new weakness tingling or numbness, No Cough - shortness of breath . Past Medical History:  Diagnosis Date   Anemia    Anginal pain ([place])    Breast cancer ([place])    Family history of bone cancer    Family history of breast cancer    Family history of prostate cancer    Family history of throat cancer    Malignant neoplasm of upper-outer quadrant of left breast in female, estrogen receptor positive ([place]) 04/08/2021   Allergies  Allergen Reactions   Anesthetics, Amide  Other (See Comments)    seizures   Other Other (See Comments)    General Anesthesia caused seizures-Propofol    Current Outpatient Medications on File Prior to Visit  Medication Sig Dispense Refill   betamethasone valerate ointment (VALISONE) 0.1 % Apply topically 2 (two) times daily.     citalopram (CELEXA) 20 MG tablet Take 1 tablet (20 mg total) by mouth daily. 90 tablet 0   doxycycline (VIBRAMYCIN) 100 MG capsule Take 100 mg by mouth 2 (two) times daily. (Patient not taking: Reported on 08/25/2022)     exemestane (AROMASIN) 25 MG tablet Take 1 tablet (25 mg total) by mouth daily after breakfast. 30 tablet 3   ibuprofen (ADVIL) 800 MG tablet Take 800 mg by mouth every 8 (eight) hours as needed.     meloxicam (MOBIC) 15 MG tablet Take 1 tablet (15 mg total) by mouth daily. 30 tablet 1   No current facility-administered medications on file prior to visit.    Observations/Objective: Weight 213   Assessment and Plan: Patient presents with possible obstructive sleep apnea. Patent has a 1 year history of symptoms of daytime fatigue, morning fatigue, and morning headache. Patient generally gets {numbers (fuzzy):14653} hours of sleep per night, and states they generally have {sleep quality:17851}. Snoring of {mild/mod/sev:10411} severity {is/are not:32546} present. Apneic episodes {is/are not:32546} present. Nasal obstruction {is/are not:32546} present.  Patient {has/ has not:15037} had tonsillectomy.    Follow Up Instructions: ***   I discussed the assessment and treatment plan with the patient. The patient was provided an  opportunity to ask questions and all were answered. The patient agreed with the plan and demonstrated an understanding of the instructions.   The patient was advised to call back or seek an in-person evaluation if the symptoms worsen or if the condition fails to improve as anticipated.     I provided *** minutes total of non-face-to-face time during this encounter  including median intraservice time, reviewing previous notes, investigations, ordering medications, medical decision making, coordinating care and patient verbalized understanding at the end of the visit.    This note has been created with Surveyor, quantity. Any transcriptional errors are unintentional.   Kerin Perna, NP 09/13/2022, 1:46 PM

## 2022-09-14 ENCOUNTER — Ambulatory Visit: Payer: Medicaid Other | Admitting: Physical Therapy

## 2022-09-16 ENCOUNTER — Ambulatory Visit: Payer: Medicaid Other | Attending: General Surgery

## 2022-09-16 DIAGNOSIS — R600 Localized edema: Secondary | ICD-10-CM | POA: Insufficient documentation

## 2022-09-16 DIAGNOSIS — C50412 Malignant neoplasm of upper-outer quadrant of left female breast: Secondary | ICD-10-CM | POA: Diagnosis present

## 2022-09-16 DIAGNOSIS — Z483 Aftercare following surgery for neoplasm: Secondary | ICD-10-CM | POA: Insufficient documentation

## 2022-09-16 DIAGNOSIS — Z170 Estrogen receptor positive status [ER+]: Secondary | ICD-10-CM | POA: Insufficient documentation

## 2022-09-16 DIAGNOSIS — M79601 Pain in right arm: Secondary | ICD-10-CM | POA: Insufficient documentation

## 2022-09-16 DIAGNOSIS — M25611 Stiffness of right shoulder, not elsewhere classified: Secondary | ICD-10-CM | POA: Insufficient documentation

## 2022-09-16 DIAGNOSIS — M25612 Stiffness of left shoulder, not elsewhere classified: Secondary | ICD-10-CM | POA: Insufficient documentation

## 2022-09-16 DIAGNOSIS — R293 Abnormal posture: Secondary | ICD-10-CM | POA: Diagnosis present

## 2022-09-16 NOTE — Therapy (Addendum)
 OUTPATIENT PHYSICAL THERAPY ONCOLOGY TREATMENT  Patient Name: Sheila [name] MRN: [id number] DOB:07/22/1971, 51 y.o., female Today's Date: 09/16/2022   PT End of Session - 09/16/22 1407     Visit Number 3    Number of Visits 9    Date for PT Re-Evaluation 09/22/22    Authorization Type [employer]    PT Start Time 1407   pt late   PT Stop Time 1453    PT Time Calculation (min) 46 min    Activity Tolerance Patient tolerated treatment well    Behavior During Therapy WFL for tasks assessed/performed             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   Past Surgical History:  Procedure Laterality Date   AXILLARY LYMPH NODE BIOPSY Left 04/29/2021   Procedure: AXILLARY LYMPH NODE BIOPSY;  Surgeon: Ebbie Cough, MD;  Location: [facility] OR;  Service: General;  Laterality: Left;   BREAST LUMPECTOMY WITH RADIOACTIVE SEED AND SENTINEL LYMPH NODE BIOPSY Left 04/29/2021   Procedure: LEFT BREAST LUMPECTOMY WITH RADIOACTIVE SEED;  Surgeon: Ebbie Cough, MD;  Location: [facility] OR;  Service: General;  Laterality: Left;   CARDIAC CATHETERIZATION     LEFT HEART CATHETERIZATION WITH CORONARY ANGIOGRAM N/A 05/30/2014   Procedure: LEFT HEART CATHETERIZATION WITH CORONARY ANGIOGRAM;  Surgeon: Peter M Jordan, MD;  Location: [facility] CATH LAB;  Service: Cardiovascular;  Laterality: N/A;   TUBAL LIGATION     Patient Active Problem List   Diagnosis Date Noted   Seizure-like activity (HCC) 08/09/2021   Postmenopausal bleeding 06/02/2021   Genetic testing 04/21/2021   Family history of breast cancer    Family history of bone cancer    Family history of prostate cancer    Family  history of throat cancer    Malignant neoplasm of upper-outer quadrant of left breast in female, 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   Women's annual routine gynecological examination 02/25/2021   Screening examination for STD (sexually transmitted disease) 02/25/2021   Skin lesion 02/25/2021   Chest pain at rest 05/30/2014   Ejection fraction    Abnormal EKG 04/24/2014   Chest discomfort 04/23/2014   Panic attack 04/23/2014   OBESITY 01/15/2009   HAIR LOSS 01/15/2009   ANEMIA-NOS 01/14/2009    PCP: Jerilynn Buddle, MD  REFERRING PROVIDER: Cough Ebbie, MD  REFERRING DIAG: [phone] (ICD-10-CM) - Malignant neoplasm of upper-outer quadrant of left female breast Z17.0 (ICD-10-CM) - Estrogen receptor positive status (ER+)    THERAPY DIAG:  Stiffness of left shoulder, not elsewhere classified  Stiffness of right shoulder, not elsewhere classified  Pain in right arm  Localized edema  Abnormal posture  Malignant neoplasm of upper-outer quadrant of left breast in female, estrogen receptor positive [facility])  Aftercare following surgery for neoplasm  ONSET DATE: 04/29/21  Rationale for Evaluation and Treatment Rehabilitation  SUBJECTIVE  SUBJECTIVE STATEMENT:  Felt a little better after last visit. Still very achy with the cold weather. PERTINENT HISTORY:  Patient was diagnosed on 03/17/2021 with left grade II invasive ductal carcinoma breast cancer. She underwent a left lumpectomy and sentinel node biopsy (1 negative node) on 04/29/2021 It is ER/PR positive and HER2 negative with a Ki67 of 15%.  PAIN:  Are you having pain? Yes NPRS scale: 7/10 Pain location:Bilateral chest, shoulder and back Pain orientation: Left  PAIN TYPE: aching Pain description: constant  Aggravating factors:being  on her feet too much; sitting a long time Relieving factors: bath with epsom salt  PRECAUTIONS: Other: pt has hx of seizures and panic attacks  WEIGHT BEARING RESTRICTIONS No  FALLS:  Has patient fallen in last 6 months? Yes. Number of falls 2 pt report she falls when she blacks out  LIVING ENVIRONMENT: Lives with: lives with their spouse and lives with their son Lives in: House/apartment Stairs: Yes; External: 4 steps; none Has following equipment at home: Environmental Consultant - 2 wheeled  OCCUPATION: currently unemployed  LEISURE: pt reports she has not been exercising but she is unable to stay awake to exercise  HAND DOMINANCE : right   PRIOR LEVEL OF FUNCTION: Needs assistance with homemaking  PATIENT GOALS decrease swelling and pain, and eventually improve strength   OBJECTIVE  COGNITION:  Overall cognitive status: pt reports she has memory issues since her seizures    PALPATION: Some fullness palpable in L lateral chest   POSTURE: forward head and rounded shoulders  UPPER EXTREMITY AROM/PROM:  A/PROM RIGHT   eval   Shoulder extension 40 with pain  Shoulder flexion 102 with pain  Shoulder abduction 74 with pain  Shoulder internal rotation 31 with pain  Shoulder external rotation 15 with pain    (Blank rows = not tested)  Lift off test- unable Empty can - p!   A/PROM LEFT   eval  Shoulder extension 30  Shoulder flexion 131  Shoulder abduction 98  Shoulder internal rotation 53  Shoulder external rotation 86    (Blank rows = not tested)   LYMPHEDEMA ASSESSMENTS:   SURGERY TYPE/DATE: L lumpectomy and SLNB 04/29/21  NUMBER OF LYMPH NODES REMOVED: 0/1  CHEMOTHERAPY: did not require  RADIATION:completed  INFECTIONS: cellulitis per pt a few months ago  LYMPHEDEMA ASSESSMENTS:   LANDMARK RIGHT  eval  10 cm proximal to olecranon process 29.6  Olecranon process 25.8  10 cm proximal to ulnar styloid process 20.6  Just proximal to ulnar styloid process 13.4   Across hand at thumb web space 17  At base of 2nd digit 4  (Blank rows = not tested)  LANDMARK LEFT  eval  10 cm proximal to olecranon process 31.5  Olecranon process 26  10 cm proximal to ulnar styloid process 19.9  Just proximal to ulnar styloid process 13.3  Across hand at thumb web space 16.7  At base of 2nd digit 3.9  (Blank rows = not tested)   L-DEX LYMPHEDEMA SCREENING:  The patient was assessed using the L-Dex machine today to produce a lymphedema index baseline score. The patient will be reassessed on a regular basis (typically every 3 months) to obtain new L-Dex scores. If the score is > 6.5 points away from his/her baseline score indicating onset of subclinical lymphedema, it will be recommended to wear a compression garment for 4 weeks, 12 hours per day and then be reassessed. If the score continues to be > 6.5 points from baseline at reassessment,  we will initiate lymphedema treatment. Assessing in this manner has a 95% rate of preventing clinically significant lymphedema.      QUICK DASH SURVEY:       TODAY'S TREATMENT   09/15/2022  Soft tissue mobilization bilateral pectorals, UT, lats and in  utUT and scapular area with cocoa butter. MFR to left axilla upper arm and elbow areas of cording PROM left shoulder flexion, scaption and abduction Right shoulder gentle PROM with multiple VC's to get pt to relax Pt perfomed AA shoulder flexion with clasped hands and was able to relax much better. Importance of letting go of guarding stressed.   09/09/22- Began PROM to R shoulder within pt's tolerance. Pt very painful and guarded initially but this did ease with manual therapy. Educated pt about importance of movement for the joint. MFR and STM to R upper arm to help decrease discomfort. Scapular mobilizations in L s/l to R scapula in direction of protraction and retraction with pt reporting improvements in pain with this. Nerve gliding with R UE in various degrees of  abduction with wrist flexion and extension with pt very tight at first but improved. STM to R scapular muscles and edge of lats where there was increased muscle tightness.   08/25/22- educated pt in post op stretches    PATIENT EDUCATION:  Education details: importance of slowly building endurance through a walking program, post op breast exercises.  Person educated: Patient Education method: Chief Technology Officer Education comprehension: verbalized understanding   HOME EXERCISE PROGRAM: Post op breast exercises  ASSESSMENT:  CLINICAL IMPRESSION: Pt with continued tightness throughout bilateral UQ but able to let her arm relax better for AAROM. Cording still present in left UE and especially palpable at the elbow. Pt felt better after treatment. OBJECTIVE IMPAIRMENTS decreased ROM, decreased strength, increased edema, increased fascial restrictions, impaired UE functional use, postural dysfunction, and pain.   ACTIVITY LIMITATIONS carrying, lifting, sleeping, and reach over head  PARTICIPATION LIMITATIONS: meal prep, cleaning, laundry, driving, community activity, occupation, and yard work  PERSONAL FACTORS Fitness, Past/current experiences, and Time since onset of injury/illness/exacerbation are also affecting patient's functional outcome.   REHAB POTENTIAL: Good  CLINICAL DECISION MAKING: Stable/uncomplicated  EVALUATION COMPLEXITY: Low  GOALS: Goals reviewed with patient? Yes  SHORT TERM GOALS=LONG TERM GOALS  Target date: 10/14/2022    Pt will demonstrate 150 degrees of bilateral shoulder flexion to allow her to reach overhead.  Baseline:R 102, L 131 Goal status: INITIAL  2.  Pt will demonstrate 130 degrees of bilateral shoulder abduction to allow her to reach out to the side.  Baseline: R 74 L 98 Goal status: INITIAL  3.  Pt will be independent in a home exercise program for long term stretching and strengthening.  Baseline:  Goal status: INITIAL  4.  Pt  will report she no longer feels like she has something under her left arm when she places it by her side to improve comfort.  Baseline:  Goal status: INITIAL  5.  Pt will report she is able to sleep through the night on either side with decreased pain by 50%.  Baseline:  Goal status: INITIAL   PLAN: PT FREQUENCY: 2x/week  PT DURATION: 4 weeks  PLANNED INTERVENTIONS: Therapeutic exercises, Therapeutic activity, Patient/Family education, Self Care, Joint mobilization, Orthotic/Fit training, Manual lymph drainage, Compression bandaging, scar mobilization, Taping, Vasopneumatic device, Manual therapy, and Re-evaluation  PLAN FOR NEXT SESSION: PROM to bilateral shoulders, AAROM to bilateral shoulders, MLD to L lateral trunk, MFR to L  axilla - may need to refer to ortho for R shoulder  PHYSICAL THERAPY DISCHARGE SUMMARY  Visits from Start of Care: 3  Current functional level related to goals / functional outcomes: Unknown, pt did not return   Remaining deficits: Unknown   Education / Equipment: HEP   Patient agrees to discharge. Patient goals were not assessed. Patient is being discharged due to not returning since the last visit.  Grayce JINNY Sheldon, PT 09/16/2022, 2:55 PM

## 2022-10-06 ENCOUNTER — Other Ambulatory Visit: Payer: Self-pay

## 2022-10-06 ENCOUNTER — Emergency Department (HOSPITAL_COMMUNITY): Payer: Medicaid Other

## 2022-10-06 ENCOUNTER — Ambulatory Visit
Admission: EM | Admit: 2022-10-06 | Discharge: 2022-10-06 | Disposition: A | Discharge status: Home / Self Care | Payer: Medicaid Other | Attending: Emergency Medicine | Admitting: Emergency Medicine

## 2022-10-06 ENCOUNTER — Emergency Department (HOSPITAL_COMMUNITY)
Admission: EM | Admit: 2022-10-06 | Discharge: 2022-10-06 | Disposition: A | Discharge status: Home / Self Care | Payer: Medicaid Other | Attending: Student | Admitting: Student

## 2022-10-06 ENCOUNTER — Encounter (HOSPITAL_COMMUNITY): Payer: Self-pay

## 2022-10-06 ENCOUNTER — Encounter: Payer: Self-pay | Admitting: *Deleted

## 2022-10-06 DIAGNOSIS — R079 Chest pain, unspecified: Secondary | ICD-10-CM | POA: Insufficient documentation

## 2022-10-06 DIAGNOSIS — R519 Headache, unspecified: Secondary | ICD-10-CM | POA: Insufficient documentation

## 2022-10-06 DIAGNOSIS — Z853 Personal history of malignant neoplasm of breast: Secondary | ICD-10-CM | POA: Insufficient documentation

## 2022-10-06 LAB — CBC
HCT: 38 % (ref 36.0–46.0)
Hemoglobin: 12.9 g/dL (ref 12.0–15.0)
MCH: 29.7 pg (ref 26.0–34.0)
MCHC: 33.9 g/dL (ref 30.0–36.0)
MCV: 87.4 fL (ref 80.0–100.0)
Platelets: 304 10*3/uL (ref 150–400)
RBC: 4.35 MIL/uL (ref 3.87–5.11)
RDW: 12.2 % (ref 11.5–15.5)
WBC: 4.7 10*3/uL (ref 4.0–10.5)
nRBC: 0 % (ref 0.0–0.2)

## 2022-10-06 LAB — TROPONIN I (HIGH SENSITIVITY)
Troponin I (High Sensitivity): 4 ng/L (ref <18)
Troponin I (High Sensitivity): 3 ng/L (ref <18)

## 2022-10-06 LAB — BASIC METABOLIC PANEL
Anion gap: 9 (ref 5–15)
BUN: 10 mg/dL (ref 6–20)
CO2: 23 mmol/L (ref 22–32)
Calcium: 9.4 mg/dL (ref 8.9–10.3)
Chloride: 107 mmol/L (ref 98–111)
Creatinine, Ser: 0.94 mg/dL (ref 0.44–1.00)
GFR, Estimated: >60 mL/min (ref >60)
Glucose, Bld: 86 mg/dL (ref 70–99)
Potassium: 3.8 mmol/L (ref 3.5–5.1)
Sodium: 139 mmol/L (ref 135–145)

## 2022-10-06 MED ORDER — IOHEXOL 350 MG/ML SOLN
75.0000 mL | Freq: Once | INTRAVENOUS | Status: AC | PRN
  Administered 2022-10-06: 75 mL via INTRAVENOUS

## 2022-10-06 MED ORDER — BUTALBITAL-APAP-CAFFEINE 50-325-40 MG PO TABS
1.0000 | ORAL_TABLET | Freq: Once | ORAL | Status: AC
  Administered 2022-10-06: 1 via ORAL
  Filled 2022-10-06: qty 1

## 2022-10-06 MED ORDER — LACTATED RINGERS IV BOLUS
1000.0000 mL | Freq: Once | INTRAVENOUS | Status: AC
  Administered 2022-10-06: 1000 mL via INTRAVENOUS

## 2022-10-06 MED ORDER — KETOROLAC TROMETHAMINE 15 MG/ML IJ SOLN
15.0000 mg | Freq: Once | INTRAMUSCULAR | Status: AC
  Administered 2022-10-06: 15 mg via INTRAVENOUS
  Filled 2022-10-06: qty 1

## 2022-10-06 MED ORDER — ASPIRIN 81 MG PO CHEW
324.0000 mg | CHEWABLE_TABLET | Freq: Once | ORAL | Status: AC
  Administered 2022-10-06: 324 mg via ORAL
  Filled 2022-10-06: qty 4

## 2022-10-06 NOTE — ED Provider Triage Note (Signed)
Emergency Medicine Provider Triage Evaluation Note  Sheila [name] , a 51 y.o. female  was evaluated in triage.  Pt complains of left-sided chest pain starting yesterday.  It is constant, it is worse with inspiration.  Associated with nausea and 1 episode of emesis.  Feels presyncopal.  History of breast cancer, not currently under chemo or radiation.  No history of ACS or PE.  Review of Systems  Per HPI  Physical Exam  BP (!) 142/86 (BP Location: Right Arm)   Pulse 84   Temp 98.3 F (36.8 C) (Oral)   Resp 18   Ht '5\' 9"'$  (1.753 m)   Wt 95.3 kg   LMP 04/03/2018 (Within Days)   SpO2 98%   BMI 31.01 kg/m  Gen:   Awake, no distress   Resp:  Normal effort  MSK:   Moves extremities without difficulty  Other:  Lower extremity pulses metric bilaterally.  Lungs are clear, regular rhythm  Medical Decision Making  Medically screening exam initiated at 3:26 PM.  Appropriate orders placed.  [name] was informed that the remainder of the evaluation will be completed by another provider, this initial triage assessment does not replace that evaluation, and the importance of remaining in the ED until their evaluation is complete.     Sherrill Raring, [name] 10/06/22 1527

## 2022-10-06 NOTE — ED Provider Notes (Incomplete)
[place] EMERGENCY DEPARTMENT Provider Note   CSN: [id number] Arrival date & time: 10/06/22  1508     History  Chief Complaint  Patient presents with  . Chest Pain    Sheila [name] is a 51 y.o. female.  With PMH of breast cancer in remission, anemia who presented with chest pain.   Chest Pain      Home Medications Prior to Admission medications   Medication Sig Start Date End Date Taking? Authorizing Provider  betamethasone valerate ointment (VALISONE) 0.1 % Apply topically 2 (two) times daily. 11/17/21   [provider]  citalopram (CELEXA) 20 MG tablet Take 1 tablet (20 mg total) by mouth daily. 07/12/22 10/10/22  Alric Ran, MD  doxycycline (VIBRAMYCIN) 100 MG capsule Take 100 mg by mouth 2 (two) times daily. Patient not taking: Reported on 08/25/2022 04/01/22   [provider]  exemestane (AROMASIN) 25 MG tablet Take 1 tablet (25 mg total) by mouth daily after breakfast. 05/07/22   Truitt Merle, MD  ibuprofen (ADVIL) 800 MG tablet Take 800 mg by mouth every 8 (eight) hours as needed.    [provider]  meloxicam (MOBIC) 15 MG tablet Take 1 tablet (15 mg total) by mouth daily. 05/31/22   Edrick Kins, DPM  UNKNOWN TO PATIENT "Several cancer meds" orally    [provider]      Allergies    Anesthetics, amide and Other    Review of Systems   Review of Systems  Cardiovascular:  Positive for chest pain.    Physical Exam Updated Vital Signs BP (!) 143/88   Pulse 72   Temp 98.1 F (36.7 C) (Oral)   Resp 16   Ht '5\' 9"'$  (1.753 m)   Wt 95.3 kg   LMP 04/03/2018 (Within Days)   SpO2 100%   BMI 31.01 kg/m  Physical Exam  ED Results / Procedures / Treatments   Labs (all labs ordered are listed, but only abnormal results are displayed) Labs Reviewed  BASIC METABOLIC PANEL  CBC  TROPONIN I (HIGH SENSITIVITY)  TROPONIN I (HIGH SENSITIVITY)    EKG EKG Interpretation  Date/Time:  Wednesday October 06 2022 15:21:09 EST Ventricular Rate:  75 PR Interval:  122 QRS Duration: 74 QT Interval:  318 QTC Calculation: 355 R Axis:   13 Text Interpretation: Normal sinus rhythm Minimal voltage criteria for LVH, may be normal variant ( R in aVL ) Cannot rule out Anterior infarct , age undetermined ST & T wave abnormality, consider inferior ischemia Abnormal ECG When compared with ECG of 06-Oct-2022 14:17, PREVIOUS ECG IS PRESENT No acute changes Confirmed by Georgina Snell [phone]) on 10/06/2022 10:01:08 PM  Radiology CT Angio Chest PE W/Cm &/Or Wo Cm  Result Date: 10/06/2022 CLINICAL DATA:  PE suspected, left-sided chest pain since yesterday EXAM: CT ANGIOGRAPHY CHEST WITH CONTRAST TECHNIQUE: Multidetector CT imaging of the chest was performed using the standard protocol during bolus administration of intravenous contrast. Multiplanar CT image reconstructions and MIPs were obtained to evaluate the vascular anatomy. RADIATION DOSE REDUCTION: This exam was performed according to the departmental dose-optimization program which includes automated exposure control, adjustment of the mA and/or kV according to patient size and/or use of iterative reconstruction technique. CONTRAST:  54m OMNIPAQUE IOHEXOL 350 MG/ML SOLN COMPARISON:  06/20/2022 FINDINGS: Cardiovascular: Satisfactory opacification of the pulmonary arteries to the segmental level. No evidence of pulmonary embolism. Normal heart size. No pericardial effusion. Mediastinum/Nodes: No enlarged mediastinal, hilar, or axillary lymph nodes.  Thyroid gland, trachea, and esophagus demonstrate no significant findings. Lungs/Pleura: Lungs are clear. No pleural effusion or pneumothorax. Upper Abdomen: No acute abnormality. Musculoskeletal: No chest wall abnormality. No acute osseous findings. Review of the MIP images confirms the above findings. IMPRESSION: Negative examination for pulmonary embolism. Electronically Signed   By: Delanna Ahmadi M.D.   On:  10/06/2022 18:39   DG Chest 2 View  Result Date: 10/06/2022 CLINICAL DATA:  Chest pain EXAM: CHEST - 2 VIEW COMPARISON:  Chest x-ray dated June 20, 2022 FINDINGS: The heart size and mediastinal contours are within normal limits. Both lungs are clear. The visualized skeletal structures are unremarkable. IMPRESSION: No active cardiopulmonary disease. Electronically Signed   By: Yetta Glassman M.D.   On: 10/06/2022 15:55    Procedures Procedures  {Document cardiac monitor, telemetry assessment procedure when appropriate:1}  Medications Ordered in ED Medications  aspirin chewable tablet 324 mg (324 mg Oral Given 10/06/22 2040)  iohexol (OMNIPAQUE) 350 MG/ML injection 75 mL (75 mLs Intravenous Contrast Given 10/06/22 1828)    ED Course/ Medical Decision Making/ A&P                           Medical Decision Making Amount and/or Complexity of Data Reviewed Radiology: ordered.  Risk Prescription drug management.   ***  {Document critical care time when appropriate:1} {Document review of labs and clinical decision tools ie heart score, Chads2Vasc2 etc:1}  {Document your independent review of radiology images, and any outside records:1} {Document your discussion with family members, caretakers, and with consultants:1} {Document social determinants of health affecting pt's care:1} {Document your decision making why or why not admission, treatments were needed:1} Final Clinical Impression(s) / ED Diagnoses Final diagnoses:  None    Rx / DC Orders ED Discharge Orders     None

## 2022-10-06 NOTE — ED Triage Notes (Signed)
Pt reports left sided sharp chest pain above her left breast associated with headache, nausea, lightheadedness and tingling to fingers on both of her hands; onset last night but worsened today.

## 2022-10-06 NOTE — ED Triage Notes (Addendum)
Pt reports starting with left lateral chest pain, below left axilla, onset last night but becoming more constant. C/O some nausea and a HA. C/O bilat hand tingling. Skin warm, dry. Denies cough or any cold sxs. STates pain was initially worse with deep breathing and movements. States took 2 Tyl.

## 2022-10-06 NOTE — Discharge Instructions (Addendum)
You were seen in the emergency department today for chest pain and headache. You workup did not reveal a definite cause of your symptoms but was generally reassuring.  As we discussed, there is no evidence of abnormal findings on your EKG, labs, head CT scan or chest CT scan.  There is no evidence of blood clot in the lungs.  Continue to drink plenty of fluids and take Tylenol and ibuprofen as needed for your headache.  Return to the emergency department immediately if you develop recurrent, severe chest pain, shortness of breath, fainting spells, sudden sweatiness, or any other concerning symptoms.   Please also make an appointment to follow up with your primary care doctor or cardiologist within one week to assure improvement or resolution in symptoms. Further testing may be necessary, so it is extremely important to keep your follow-up appointment with your primary doctor.   If you have not heard from the Cardiology office within the next 72 hours please call [phone].

## 2022-10-06 NOTE — ED Provider Notes (Signed)
[facility] EMERGENCY DEPARTMENT Provider Note   CSN: [id number] Arrival date & time: 10/06/22  1508     History  Chief Complaint  Patient presents with   Chest Pain    Sheila [name] is a 51 y.o. female.  With PMH of breast cancer in remission, anemia who presented with chest pain.   Chest Pain      Home Medications Prior to Admission medications   Medication Sig Start Date End Date Taking? Authorizing Provider  betamethasone valerate ointment (VALISONE) 0.1 % Apply topically 2 (two) times daily. 11/17/21   [provider]  citalopram (CELEXA) 20 MG tablet Take 1 tablet (20 mg total) by mouth daily. 07/12/22 10/10/22  Alric Ran, MD  doxycycline (VIBRAMYCIN) 100 MG capsule Take 100 mg by mouth 2 (two) times daily. Patient not taking: Reported on 08/25/2022 04/01/22   [provider]  exemestane (AROMASIN) 25 MG tablet Take 1 tablet (25 mg total) by mouth daily after breakfast. 05/07/22   Truitt Merle, MD  ibuprofen (ADVIL) 800 MG tablet Take 800 mg by mouth every 8 (eight) hours as needed.    [provider]  meloxicam (MOBIC) 15 MG tablet Take 1 tablet (15 mg total) by mouth daily. 05/31/22   Edrick Kins, DPM  UNKNOWN TO PATIENT "Several cancer meds" orally    [provider]      Allergies    Anesthetics, amide and Other    Review of Systems   Review of Systems  Cardiovascular:  Positive for chest pain.    Physical Exam Updated Vital Signs BP (!) 143/88   Pulse 72   Temp 98.1 F (36.7 C) (Oral)   Resp 16   Ht '5\' 9"'$  (1.753 m)   Wt 95.3 kg   LMP 04/03/2018 (Within Days)   SpO2 100%   BMI 31.01 kg/m  Physical Exam  ED Results / Procedures / Treatments   Labs (all labs ordered are listed, but only abnormal results are displayed) Labs Reviewed  BASIC METABOLIC PANEL  CBC  TROPONIN I (HIGH SENSITIVITY)  TROPONIN I (HIGH SENSITIVITY)    EKG EKG Interpretation  Date/Time:  Wednesday October 06 2022 15:21:09 EST Ventricular Rate:  75 PR Interval:  122 QRS Duration: 74 QT Interval:  318 QTC Calculation: 355 R Axis:   13 Text Interpretation: Normal sinus rhythm Minimal voltage criteria for LVH, may be normal variant ( R in aVL ) Cannot rule out Anterior infarct , age undetermined ST & T wave abnormality, consider inferior ischemia Abnormal ECG When compared with ECG of 06-Oct-2022 14:17, PREVIOUS ECG IS PRESENT No acute changes Confirmed by Georgina Snell [phone]) on 10/06/2022 10:01:08 PM  Radiology CT Angio Chest PE W/Cm &/Or Wo Cm  Result Date: 10/06/2022 CLINICAL DATA:  PE suspected, left-sided chest pain since yesterday EXAM: CT ANGIOGRAPHY CHEST WITH CONTRAST TECHNIQUE: Multidetector CT imaging of the chest was performed using the standard protocol during bolus administration of intravenous contrast. Multiplanar CT image reconstructions and MIPs were obtained to evaluate the vascular anatomy. RADIATION DOSE REDUCTION: This exam was performed according to the departmental dose-optimization program which includes automated exposure control, adjustment of the mA and/or kV according to patient size and/or use of iterative reconstruction technique. CONTRAST:  2m OMNIPAQUE IOHEXOL 350 MG/ML SOLN COMPARISON:  06/20/2022 FINDINGS: Cardiovascular: Satisfactory opacification of the pulmonary arteries to the segmental level. No evidence of pulmonary embolism. Normal heart size. No pericardial effusion. Mediastinum/Nodes: No enlarged mediastinal, hilar, or axillary lymph nodes.  Thyroid gland, trachea, and esophagus demonstrate no significant findings. Lungs/Pleura: Lungs are clear. No pleural effusion or pneumothorax. Upper Abdomen: No acute abnormality. Musculoskeletal: No chest wall abnormality. No acute osseous findings. Review of the MIP images confirms the above findings. IMPRESSION: Negative examination for pulmonary embolism. Electronically Signed   By: Delanna Ahmadi M.D.   On:  10/06/2022 18:39   DG Chest 2 View  Result Date: 10/06/2022 CLINICAL DATA:  Chest pain EXAM: CHEST - 2 VIEW COMPARISON:  Chest x-ray dated June 20, 2022 FINDINGS: The heart size and mediastinal contours are within normal limits. Both lungs are clear. The visualized skeletal structures are unremarkable. IMPRESSION: No active cardiopulmonary disease. Electronically Signed   By: Yetta Glassman M.D.   On: 10/06/2022 15:55    Procedures Procedures  {Document cardiac monitor, telemetry assessment procedure when appropriate:1}  Medications Ordered in ED Medications  aspirin chewable tablet 324 mg (324 mg Oral Given 10/06/22 2040)  iohexol (OMNIPAQUE) 350 MG/ML injection 75 mL (75 mLs Intravenous Contrast Given 10/06/22 1828)    ED Course/ Medical Decision Making/ A&P                           Medical Decision Making  ***  {Document critical care time when appropriate:1} {Document review of labs and clinical decision tools ie heart score, Chads2Vasc2 etc:1}  {Document your independent review of radiology images, and any outside records:1} {Document your discussion with family members, caretakers, and with consultants:1} {Document social determinants of health affecting pt's care:1} {Document your decision making why or why not admission, treatments were needed:1} Final Clinical Impression(s) / ED Diagnoses Final diagnoses:  None    Rx / DC Orders ED Discharge Orders     None

## 2022-10-06 NOTE — ED Notes (Signed)
Patient is being discharged from the [facility] and sent to the Emergency Department via private vehicle with family. Per L. Lilia Pro, [place], patient is stable but in need of higher level of care due to chest pain. Patient is aware and verbalizes understanding of plan of care.  Vitals:   10/06/22 1430 10/06/22 1432  BP: 128/89   Pulse: 88   Resp: 18   Temp:  98.5 F (36.9 C)  SpO2: 96%

## 2022-10-06 NOTE — ED Provider Notes (Signed)
  [facility]    CSN: [id number] Arrival date & time: 10/06/22  1417    HISTORY   Chief Complaint  Patient presents with   Chest Pain   HPI Sheila [name] is a pleasant, 51 y.o. female who presents to urgent care. Pt reports starting with left lateral chest pain, below left axilla, onset last night but becoming more constant. C/O some nausea and a HA. C/O bilat hand tingling. Skin warm, dry. Denies cough or any cold sxs. STates pain was initially worse with deep breathing and movements. States took 2 Tyl.  EKG performed today was abnormal, there is T wave inversion in V2 through V6.  Patient has a history of abnormal EKGs and previous work-up for myocardial ischemia and pulmonary embolism has been negative to date.  Patient was recently diagnosed with breast cancer.  The history is provided by the patient.   Past Medical History:  Diagnosis Date   Anemia    Anginal pain ([place])    Breast cancer ([place])    Family history of bone cancer    Family history of breast cancer    Family history of prostate cancer    Family history of throat cancer    Malignant neoplasm of upper-outer quadrant of left breast in female, estrogen receptor positive ([place]) 04/08/2021    Triage Vital Signs ED Triage Vitals  Enc Vitals Group     BP 09/11/21 0827 (!) 147/82     Pulse Rate 09/11/21 0827 72     Resp 09/11/21 0827 18     Temp 09/11/21 0827 98.3 F (36.8 C)     Temp Source 09/11/21 0827 Oral     SpO2 09/11/21 0827 98 %     Weight --      Height --      Head Circumference --      Peak Flow --      Pain Score 09/11/21 0826 5     Pain Loc --      Pain Edu? --      Excl. in [place]? --   No data found.  Updated Vital Signs BP 128/89   Pulse 88   Temp 98.5 F (36.9 C) (Oral)   Resp 18   LMP 04/03/2018 (Within Days)   SpO2 96%   LIMITED PHYSICAL EXAM: I have reviewed the triage vital signs and the nursing notes.  Patient is ill-appearing but appears to be in no acute  distress Heart sounds normal without murmur rub or gallop Lungs clear to auscultation and equal bilaterally Bilateral lower extremity edema appreciated. Patient is alert and oriented x4 Vital signs are normal on arrival  Final diagnoses:  Chest pain at rest     PDMP not reviewed this encounter.  Disposition Upon Discharge:  Condition: stable for discharge Emergency Room via private vehicle operated by patient.  Patient verbally agrees to go now.  This office note has been dictated using Museum/gallery curator.  Unfortunately, this method of dictation can sometimes lead to typographical or grammatical errors.  I apologize for your inconvenience in advance if this occurs.  Please do not hesitate to reach out to me if clarification is needed.      [name] Scales, [name] 10/06/22 1501

## 2022-10-20 IMAGING — US US PLC BREAST LOC DEV 1ST LESION INC US GUIDE*L*
1 series · 2 of 2 positions shown · non-contrast
Comparison: Previous exam(s).

CLINICAL DATA: Ultrasound-guided radioactive seed placement

EXAM:
ULTRASOUND GUIDED RADIOACTIVE SEED LOCALIZATION OF THE LEFT BREAST

[Series 1: us plc breast loc dev 1st lesion inc us guide*left · 0.07mm/px · 2 of 2 slices shown]
[im 1/2]
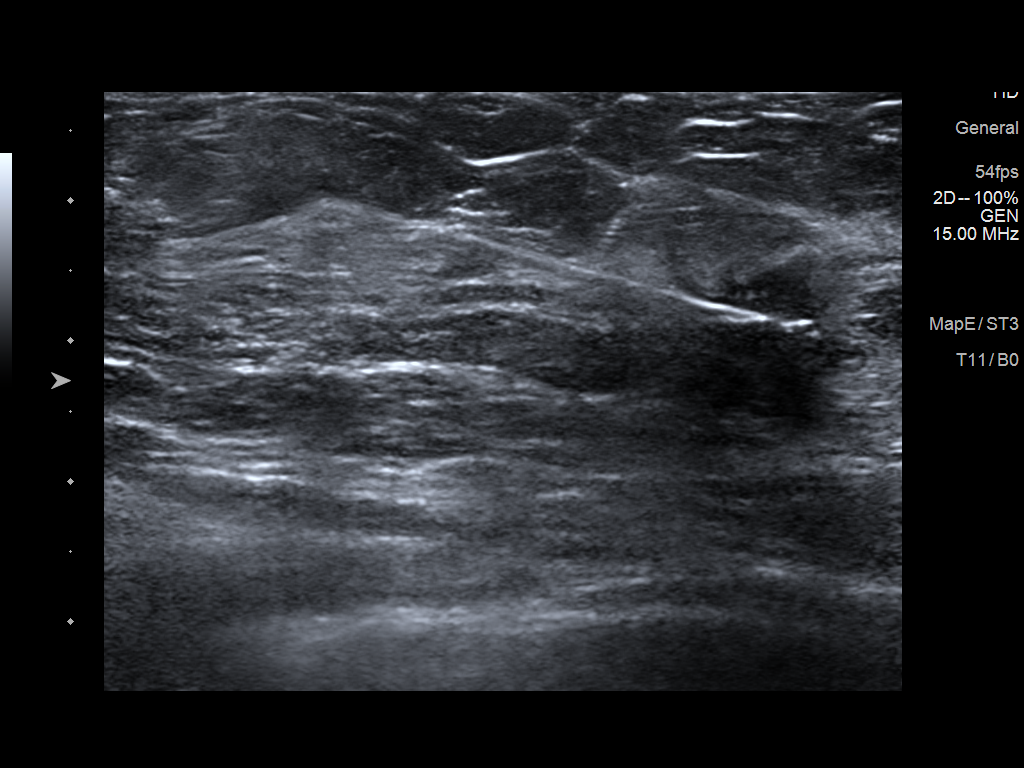
[im 2/2]
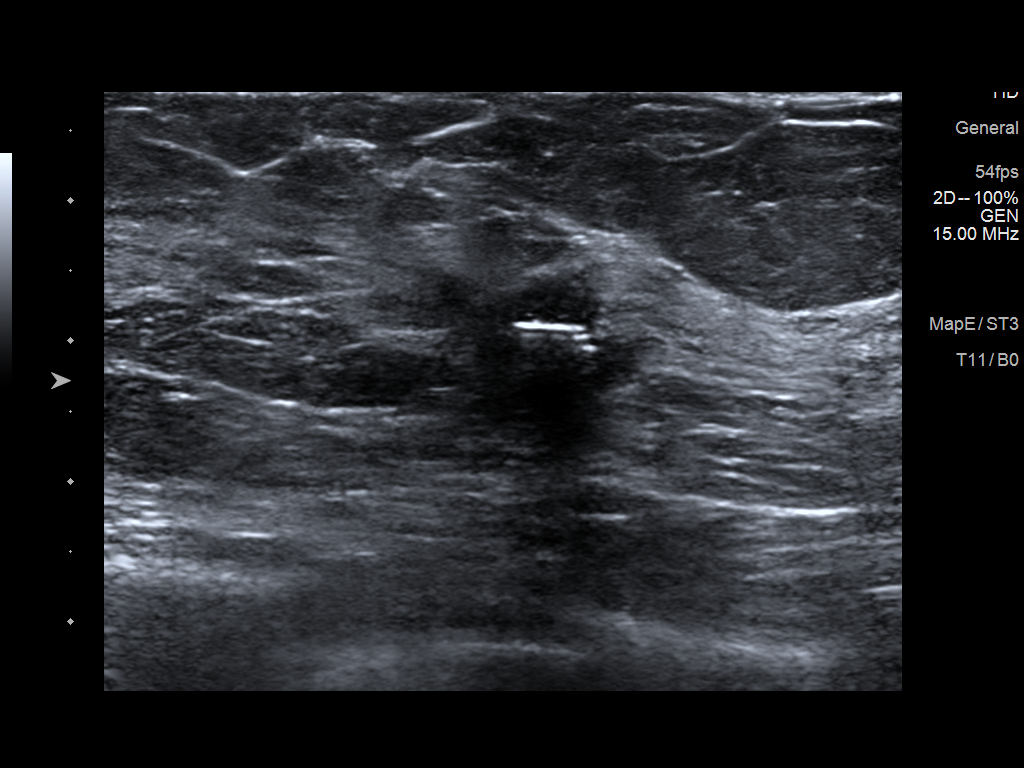

[2 of 2 positions shown; findings below may reference images not displayed]



The usual time-out protocol was performed immediately prior to the
procedure.

Using ultrasound guidance, sterile technique, 1% lidocaine and an
N-ZFQ radioactive seed, the known malignancy and biopsy clip were
localized using a lateral approach. The follow-up mammogram images
confirm the seed in the expected location and were marked for the
surgeon.

Follow-up survey of the patient confirms presence of the radioactive
seed.

Order number of N-ZFQ seed:  [id number].

Total activity:  0.244 millicurie reference Date: April 06, 2021

The patient tolerated the procedure well and was released from the
[REDACTED]. She was given instructions regarding seed removal.

Follow-up mammogram demonstrated the radioactive seed immediately
adjacent to the biopsy clip within the known malignancy.
IMPRESSION: Radioactive seed localization left breast. No apparent
complications.

## 2022-10-25 ENCOUNTER — Encounter: Payer: Self-pay | Admitting: Neurology

## 2022-10-25 ENCOUNTER — Ambulatory Visit: Payer: Medicaid Other | Admitting: Neurology

## 2022-10-28 ENCOUNTER — Ambulatory Visit: Payer: Medicaid Other | Admitting: Physician Assistant

## 2022-10-29 ENCOUNTER — Ambulatory Visit (INDEPENDENT_AMBULATORY_CARE_PROVIDER_SITE_OTHER): Payer: Medicaid Other

## 2022-10-29 ENCOUNTER — Ambulatory Visit: Payer: Medicaid Other | Attending: Physician Assistant | Admitting: Internal Medicine

## 2022-10-29 ENCOUNTER — Encounter: Payer: Self-pay | Admitting: Internal Medicine

## 2022-10-29 VITALS — BP 128/70 | HR 76 | Ht 69.0 in | Wt 209.0 lb

## 2022-10-29 DIAGNOSIS — R002 Palpitations: Secondary | ICD-10-CM

## 2022-10-29 DIAGNOSIS — R0683 Snoring: Secondary | ICD-10-CM | POA: Diagnosis not present

## 2022-10-29 DIAGNOSIS — R0602 Shortness of breath: Secondary | ICD-10-CM | POA: Diagnosis not present

## 2022-10-29 DIAGNOSIS — C50412 Malignant neoplasm of upper-outer quadrant of left female breast: Secondary | ICD-10-CM

## 2022-10-29 DIAGNOSIS — R0609 Other forms of dyspnea: Secondary | ICD-10-CM | POA: Diagnosis not present

## 2022-10-29 DIAGNOSIS — Z170 Estrogen receptor positive status [ER+]: Secondary | ICD-10-CM

## 2022-10-29 NOTE — Progress Notes (Unsigned)
Enrolled patient for a 3 day Zio XT monitor to be mailed to patients home  

## 2022-10-29 NOTE — Patient Instructions (Addendum)
Medication Instructions:  No Changes In Medications at this time.  *If you need a refill on your cardiac medications before your next appointment, please call your pharmacy*  Lab Work: None Ordered At This Time.  If you have labs (blood work) drawn today and your tests are completely normal, you will receive your results only by: [place] (if you have MyChart) OR A paper copy in the mail If you have any lab test that is abnormal or we need to change your treatment, we will call you to review the results.  Testing/Procedures: Your physician has requested that you have an echocardiogram. Echocardiography is a painless test that uses sound waves to create images of your heart. It provides your doctor with information about the size and shape of your heart and how well your heart's chambers and valves are working. You may receive an ultrasound enhancing agent through an IV if needed to better visualize your heart during the echo.This procedure takes approximately one hour. There are no restrictions for this procedure. This will take place at the [address], [address].   Your physician has recommended that you have a sleep study. This test records several body functions during sleep, including: brain activity, eye movement, oxygen and carbon dioxide blood levels, heart rate and rhythm, breathing rate and rhythm, the flow of air through your mouth and nose, snoring, body muscle movements, and chest and belly movement.   ZIO XT- Long Term Monitor Instructions   Your physician has requested you wear your ZIO patch monitor___3____days.   This is a single patch monitor.  [employer] supplies one patch monitor per enrollment.  Additional stickers are not available.   Please do not apply patch if you will be having a Nuclear Stress Test, Echocardiogram, Cardiac CT, MRI, or Chest Xray during the time frame you would be wearing the monitor. The patch cannot be worn during these tests.  You cannot  remove and re-apply the ZIO XT patch monitor.   Your ZIO patch monitor will be sent USPS Priority mail from [facility] directly to your home address. The monitor may also be mailed to a PO BOX if home delivery is not available.   It may take 3-5 days to receive your monitor after you have been enrolled.   Once you have received you monitor, please review enclosed instructions.  Your monitor has already been registered assigning a specific monitor serial # to you.   Applying the monitor   Shave hair from upper left chest.   Hold abrader disc by orange tab.  Rub abrader in 40 strokes over left upper chest as indicated in your monitor instructions.   Clean area with 4 enclosed alcohol pads .  Use all pads to assure are is cleaned thoroughly.  Let dry.   Apply patch as indicated in monitor instructions.  Patch will be place under collarbone on left side of chest with arrow pointing upward.   Rub patch adhesive wings for 2 minutes.Remove white label marked "1".  Remove white label marked "2".  Rub patch adhesive wings for 2 additional minutes.   While looking in a mirror, press and release button in center of patch.  A small green light will flash 3-4 times .  This will be your only indicator the monitor has been turned on.     Do not shower for the first 24 hours.  You may shower after the first 24 hours.   Press button if you feel a  symptom. You will hear a small click.  Record Date, Time and Symptom in the Patient Log Book.   When you are ready to remove patch, follow instructions on last 2 pages of Patient Log Book.  Stick patch monitor onto last page of Patient Log Book.   Place Patient Log Book in [place] box.  Use locking tab on box and tape box closed securely.  The [employer] and [employer] has [employer] on it.  Please place in mailbox as soon as possible.  Your physician should have your test results approximately 7 days after the monitor has been mailed back to [facility].    Call [place] at [phone] if you have questions regarding your ZIO XT patch monitor.  Call them immediately if you see an orange light blinking on your monitor.   If your monitor falls off in less than 4 days contact our Monitor department at [phone].  If your monitor becomes loose or falls off after 4 days call [employer] at [phone] for suggestions on securing your monitor.   Follow-Up: At [name], you and your health needs are our priority.  As part of our continuing mission to provide you with exceptional heart care, we have created designated Provider Care Teams.  These Care Teams include your primary Cardiologist (physician) and Advanced Practice Providers (APPs -  Physician Assistants and Nurse Practitioners) who all work together to provide you with the care you need, when you need it.  Your next appointment:   3 month(s)  The format for your next appointment:   In Person  Provider:   Elouise [name], MD

## 2022-10-29 NOTE — Progress Notes (Signed)
Cardiology Office Note:    Date:  10/29/2022   ID:  [name], DOB 23-Jun-1971, MRN [id number]  PCP:  Griffin Basil, MD  Cardiologist:  Elouise [name], MD  Electrophysiologist:  None   Referring MD: Griffin Basil, MD   Chief Complaint/Reason for Referral: Follow up; left breast cancer  History of Present Illness:    Sheila [name] is a 51 y.o. female with a history of history of anemia and impaired fasting glucose who presents for follow up. She has been diagnosed with a malignant neoplasm of upper-outer quadrant of left breast, estrogen receptor positive in late May 2022, followed by Dr. 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 She has been given adjuvant antiestrogen therapy, she has also been prescribed radiation therapy.   Patient was last seen by me 07/2021 and was doing well at that time. She was seen in the ED on 10/06/22 for left lateral chest pain below her left axilla.  Today, she states that she is doing okay. She reports that she cannot walk long distances without significant shortness of breath. She has to take breaks when grocery shopping. She does have episodes of chest pain at night occasionally. She has had episodes of PND and orthopnea. She does snore and has had witnessed apneic episodes. Patient's daughter, Rondall Allegra, states that there is an active order for a sleep study. She has daily episodes of palpitations while sitting at rest.  She has frequent headaches and history of seizure which concerns her daughter. She has had MRIs and CTs, and is followed by neurology.   In regards to her history of left breast cancer, she did have radiotherapy. She is not on any antiestrogens as she did not tolerate Tamoxifen. She had negative genetic testing. She last saw her surgeon Dr. Donne Hazel for follow up on 09/01/22. She last saw her oncologist Dr. Burr Medico on 05/07/22 who started her on Aromasin '25mg'$   daily.  Past Medical History:  Diagnosis Date   Anemia    Anginal pain ([place])    Breast cancer ([place])    Family history of bone cancer    Family history of breast cancer    Family history of prostate cancer    Family history of throat cancer    Malignant neoplasm of upper-outer quadrant of left breast in female, estrogen receptor positive ([place]) 04/08/2021    Past Surgical History:  Procedure Laterality Date   AXILLARY LYMPH NODE BIOPSY Left 04/29/2021   Procedure: AXILLARY LYMPH NODE BIOPSY;  Surgeon: Rolm Bookbinder, MD;  Location: [facility];  Service: General;  Laterality: Left;   BREAST LUMPECTOMY WITH RADIOACTIVE SEED AND SENTINEL LYMPH NODE BIOPSY Left 04/29/2021   Procedure: LEFT BREAST LUMPECTOMY WITH RADIOACTIVE SEED;  Surgeon: Rolm Bookbinder, MD;  Location: [facility];  Service: General;  Laterality: Left;   CARDIAC CATHETERIZATION     LEFT HEART CATHETERIZATION WITH CORONARY ANGIOGRAM N/A 05/30/2014   Procedure: LEFT HEART CATHETERIZATION WITH CORONARY ANGIOGRAM;  Surgeon: Peter M Martinique, MD;  [facility];  Service: Cardiovascular;  Laterality: N/A;   TUBAL LIGATION      Current Medications: No outpatient medications have been marked as taking for the 10/29/22 encounter (Office Visit) with Elouise [name], MD.   Outpatient Encounter Medications as of 10/29/2022  Medication Sig   [DISCONTINUED] betamethasone valerate ointment (VALISONE) 0.1 % Apply topically 2 (two) times daily. (Patient not taking: Reported on 10/29/2022)   [DISCONTINUED]  citalopram (CELEXA) 20 MG tablet Take 1 tablet (20 mg total) by mouth daily. (Patient not taking: Reported on 10/29/2022)   [DISCONTINUED] doxycycline (VIBRAMYCIN) 100 MG capsule Take 100 mg by mouth 2 (two) times daily. (Patient not taking: Reported on 10/29/2022)   [DISCONTINUED] exemestane (AROMASIN) 25 MG tablet Take 1 tablet (25 mg total) by mouth daily after breakfast. (Patient not taking: Reported on 10/29/2022)    [DISCONTINUED] ibuprofen (ADVIL) 800 MG tablet Take 800 mg by mouth every 8 (eight) hours as needed. (Patient not taking: Reported on 10/29/2022)   [DISCONTINUED] meloxicam (MOBIC) 15 MG tablet Take 1 tablet (15 mg total) by mouth daily. (Patient not taking: Reported on 10/29/2022)   [DISCONTINUED] UNKNOWN TO PATIENT "Several cancer meds" orally (Patient not taking: Reported on 10/29/2022)   No facility-administered encounter medications on file as of 10/29/2022.     Allergies:   Anesthetics, amide and Other   Social History   Tobacco Use   Smoking status: Never   Smokeless tobacco: Never  Vaping Use   Vaping Use: Never used  Substance Use Topics   Alcohol use: No   Drug use: No     Family History: The patient's family history includes Bone cancer (age of onset: 91) in her nephew; Breast cancer in her cousin and other family members; Breast cancer (age of onset: 1) in her mother; Breast cancer (age of onset: 13) in her maternal grandmother; Cancer in her paternal aunt, paternal uncle, paternal uncle, and other family members; Colon cancer in her maternal uncle; Crohn's disease in her brother and sister; Diabetes in her father; Heart Problems in her mother; Heart disease in her mother and sister; Hyperlipidemia in her mother; Hypertension in her maternal aunt, maternal grandmother, maternal uncle, and mother; Pancreatic cancer in her cousin; Prostate cancer in her maternal uncle, maternal uncle and another family member; Seizures in her father; Sickle cell anemia in an other family member; Stroke in her maternal grandfather and maternal grandmother; Throat cancer in her paternal uncle. There is no history of Esophageal cancer, Stomach cancer, or Rectal cancer.  ROS:   Please see the history of present illness.   + PND + orthopnea + DOE + chest pain at night + palpitations  All other systems reviewed and are negative.  EKGs/Labs/Other Studies Reviewed:    The following studies were  reviewed today:  EKG:  EKG ordered today, 10/29/22.  The ekg ordered today demonstrates sinus rhythm, ST and T wave abnormality inferior and anterolateral. 08/06/21: NSR, T wave abnl laterally, no change from prior.  Recent Labs: 05/07/2022: ALT 18 10/06/2022: BUN 10; Creatinine, Ser 0.94; Hemoglobin 12.9; Platelets 304; Potassium 3.8; Sodium 139  Recent Lipid Panel    Component Value Date/Time   CHOL 213 (H) 06/30/2020 1553   TRIG 93 06/30/2020 1553   HDL 55 06/30/2020 1553   CHOLHDL 3.9 06/30/2020 1553   CHOLHDL 3.4 05/31/2014 0433   VLDL 13 05/31/2014 0433   LDLCALC 141 (H) 06/30/2020 1553    Physical Exam:    VS:  BP 128/70   Pulse 76   Ht '5\' 9"'$  (1.753 m)   Wt 209 lb (94.8 kg)   LMP 04/03/2018 (Within Days)   SpO2 98%   BMI 30.86 kg/m     Wt Readings from Last 5 Encounters:  10/29/22 209 lb (94.8 kg)  10/06/22 210 lb (95.3 kg)  07/12/22 213 lb (96.6 kg)  05/07/22 212 lb 6.4 oz (96.3 kg)  04/14/22 211 lb (95.7 kg)  Constitutional: No acute distress Eyes: sclera non-icteric, normal conjunctiva and lids ENMT: normal dentition, moist mucous membranes Chest: large horizontally-oriented keloid scar across precordium Cardiovascular: regular rhythm, normal rate, no murmurs. S1 and S2 normal. No jugular venous distention.  Respiratory: clear to auscultation bilaterally GI : normal bowel sounds, soft and nontender. No distention.   MSK: extremities warm, well perfused. No edema.  NEURO: grossly nonfocal exam, moves all extremities. PSYCH: alert and oriented x 3, normal mood and affect.   ASSESSMENT:    1. Palpitations   2. Shortness of breath   3. Snoring      PLAN:    Chest pain  -no significant recurrence. 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   Left breast cancer -followed by oncology, Dr. Burr Medico. Completed Radiotherapy. Recently started Aromasin. -Last echo in 2021.   Shortness of breath with exertion Palpitations -will repeat echo -ordered 3 day Zio  monitor  Shortness of breath Snoring PND -will order home sleep study, actively in process.  HLD  -Recommend repeat lipids with PCP.    Total time of encounter: 30 minutes total time of encounter, including 20 minutes spent in face-to-face patient care on the date of this encounter. This time includes coordination of care and counseling regarding above mentioned problem list. Remainder of non-face-to-face time involved reviewing chart documents/testing relevant to the patient encounter and documentation in the medical record. I have independently reviewed documentation from referring provider.   Cherlynn Kaiser, MD, [facility]     Medication Adjustments/Labs and Tests Ordered: Current medicines are reviewed at length with the patient today.  Concerns regarding medicines are outlined above.   Orders Placed This Encounter  Procedures   LONG TERM MONITOR (3-14 DAYS)   EKG 12-Lead   ECHOCARDIOGRAM COMPLETE   Home sleep test     No orders of the defined types were placed in this encounter.    Patient Instructions  Medication Instructions:  No Changes In Medications at this time.  *If you need a refill on your cardiac medications before your next appointment, please call your pharmacy*  Lab Work: None Ordered At This Time.  If you have labs (blood work) drawn today and your tests are completely normal, you will receive your results only by: [place] (if you have MyChart) OR A paper copy in the mail If you have any lab test that is abnormal or we need to change your treatment, we will call you to review the results.  Testing/Procedures: Your physician has requested that you have an echocardiogram. Echocardiography is a painless test that uses sound waves to create images of your heart. It provides your doctor with information about the size and shape of your heart and how well your heart's chambers and valves are working. You may receive an  ultrasound enhancing agent through an IV if needed to better visualize your heart during the echo.This procedure takes approximately one hour. There are no restrictions for this procedure. This will take place at the [address]   Your physician has recommended that you have a sleep study. This test records several body functions during sleep, including: brain activity, eye movement, oxygen and carbon dioxide blood levels, heart rate and rhythm, breathing rate and rhythm, the flow of air through your mouth and nose, snoring, body muscle movements, and chest and belly movement.   ZIO XT- Long Term Monitor Instructions   Your physician has requested you wear your ZIO patch monitor___3____days.  This is a single patch monitor.  [employer] supplies one patch monitor per enrollment.  Additional stickers are not available.   Please do not apply patch if you will be having a Nuclear Stress Test, Echocardiogram, Cardiac CT, MRI, or Chest Xray during the time frame you would be wearing the monitor. The patch cannot be worn during these tests.  You cannot remove and re-apply the ZIO XT patch monitor.   Your ZIO patch monitor will be sent USPS Priority mail from [facility] directly to your home address. The monitor may also be mailed to a PO BOX if home delivery is not available.   It may take 3-5 days to receive your monitor after you have been enrolled.   Once you have received you monitor, please review enclosed instructions.  Your monitor has already been registered assigning a specific monitor serial # to you.   Applying the monitor   Shave hair from upper left chest.   Hold abrader disc by orange tab.  Rub abrader in 40 strokes over left upper chest as indicated in your monitor instructions.   Clean area with 4 enclosed alcohol pads .  Use all pads to assure are is cleaned thoroughly.  Let dry.   Apply patch as indicated in monitor instructions.  Patch will be place  under collarbone on left side of chest with arrow pointing upward.   Rub patch adhesive wings for 2 minutes.Remove white label marked "1".  Remove white label marked "2".  Rub patch adhesive wings for 2 additional minutes.   While looking in a mirror, press and release button in center of patch.  A small green light will flash 3-4 times .  This will be your only indicator the monitor has been turned on.     Do not shower for the first 24 hours.  You may shower after the first 24 hours.   Press button if you feel a symptom. You will hear a small click.  Record Date, Time and Symptom in the Patient Log Book.   When you are ready to remove patch, follow instructions on last 2 pages of Patient Log Book.  Stick patch monitor onto last page of Patient Log Book.   Place Patient Log Book in [place] box.  Use locking tab on box and tape box closed securely.  The [employer] has [employer] on it.  Please place in mailbox as soon as possible.  Your physician should have your test results approximately 7 days after the monitor has been mailed back to [facility].   Call [place] at [phone] if you have questions regarding your ZIO XT patch monitor.  Call them immediately if you see an orange light blinking on your monitor.   If your monitor falls off in less than 4 days contact our Monitor department at [phone].  If your monitor becomes loose or falls off after 4 days call [employer] at [phone] for suggestions on securing your monitor.   Follow-Up: At [facility], you and your health needs are our priority.  As part of our continuing mission to provide you with exceptional heart care, we have created designated Provider Care Teams.  These Care Teams include your primary Cardiologist (physician) and Advanced Practice Providers (APPs -  Physician Assistants and Nurse Practitioners) who all work together to provide you with the care you need, when you  need it.  Your next appointment:   3 month(s)  The format for your  next appointment:   In Person  Provider:   Elouise [name], MD              I,Alexis Herring,acting as a scribe for Elouise [name], MD.,have documented all relevant documentation on the behalf of Elouise [name], MD,as directed by  Elouise [name], MD while in the presence of Elouise [name], MD.  I, Elouise [name], MD, have reviewed all documentation for the visit on 10/29/2022. The documentation on today's date of service for the exam, diagnosis, procedures, and orders are all accurate and complete.

## 2022-11-03 DIAGNOSIS — R002 Palpitations: Secondary | ICD-10-CM | POA: Diagnosis not present

## 2022-11-11 IMAGING — US US PELVIS COMPLETE TRANSABD/TRANSVAG W DUPLEX
1 series · 13 of 25 positions shown · non-contrast
Comparison: CT 04/05/2021, ultrasound 07/29/2016

CLINICAL DATA: Abnormal postmenopausal bleeding

EXAM:
TRANSABDOMINAL AND TRANSVAGINAL ULTRASOUND OF PELVIS
DOPPLER ULTRASOUND OF OVARIES
TECHNIQUE: Both transabdominal and transvaginal ultrasound examinations of the
pelvis were performed. Transabdominal technique was performed for
global imaging of the pelvis including uterus, ovaries, adnexal
regions, and pelvic cul-de-sac.
It was necessary to proceed with endovaginal exam following the
transabdominal exam to visualize the endometrium and ovaries. Color
and duplex Doppler ultrasound was utilized to evaluate blood flow to
the ovaries.

[Series 1: us pelvic complete w transvaginal and torsion righ · 13 of 64 slices shown]
[im 1/64]
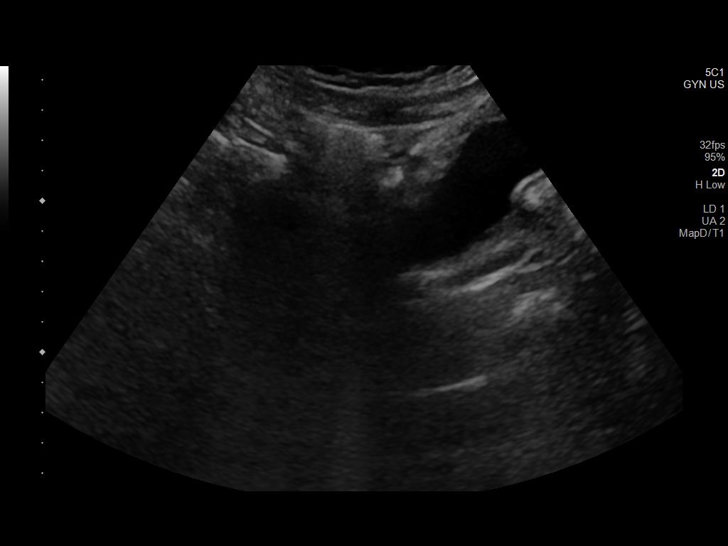
[im 6/64]
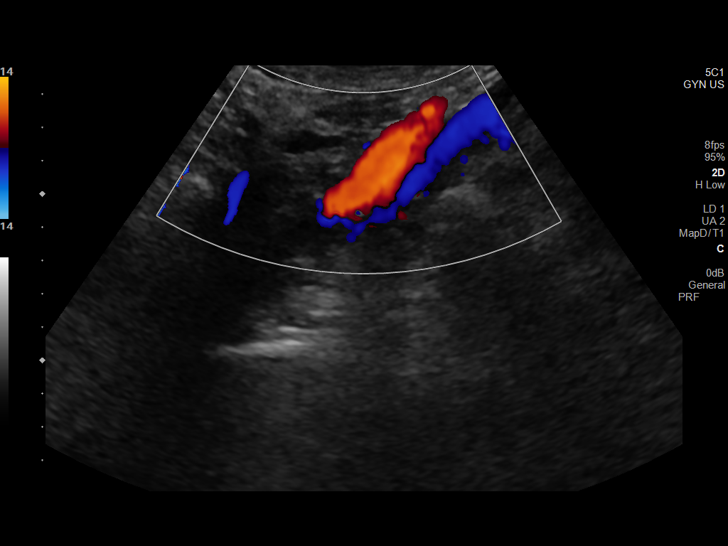
[im 11/64]
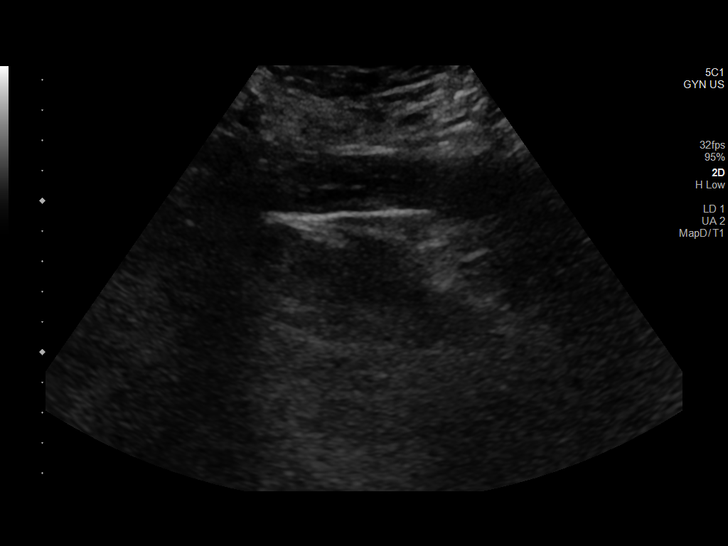
[im 16/64]
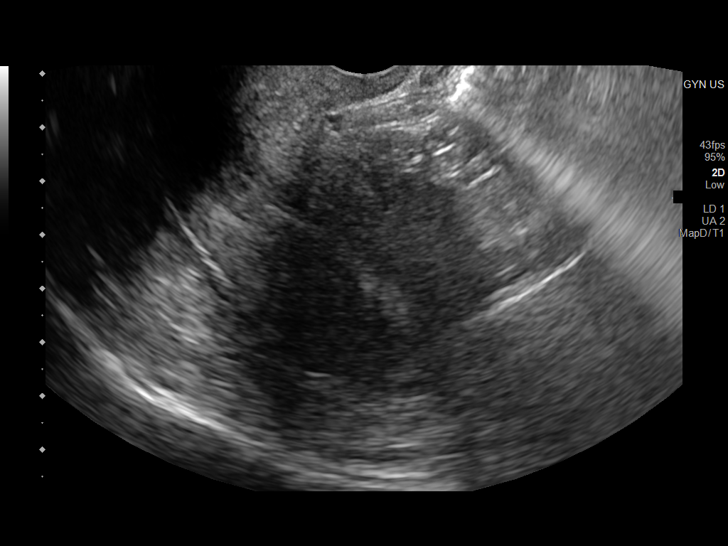
[im 22/64]
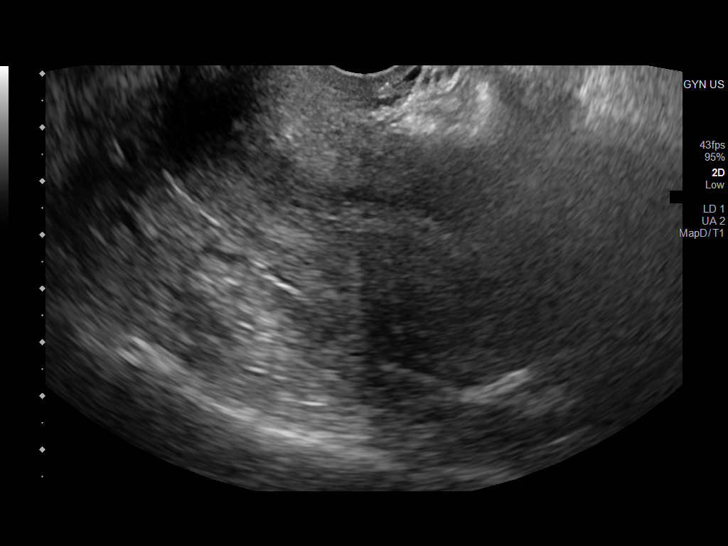
[im 27/64]
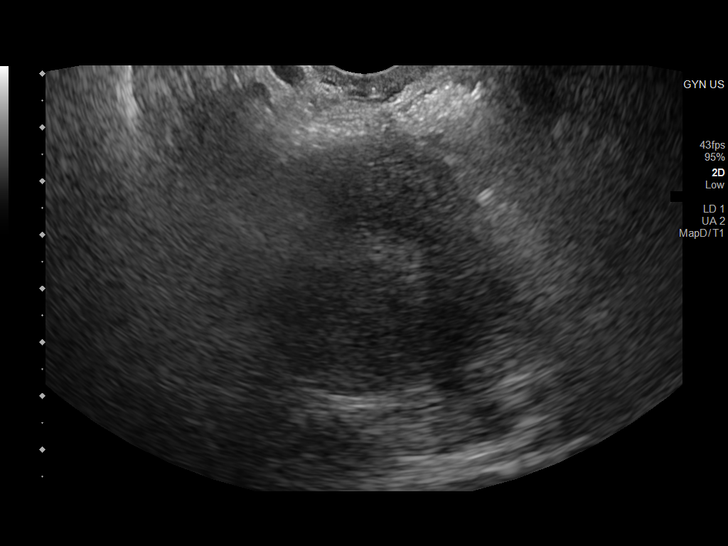
[im 32/64]
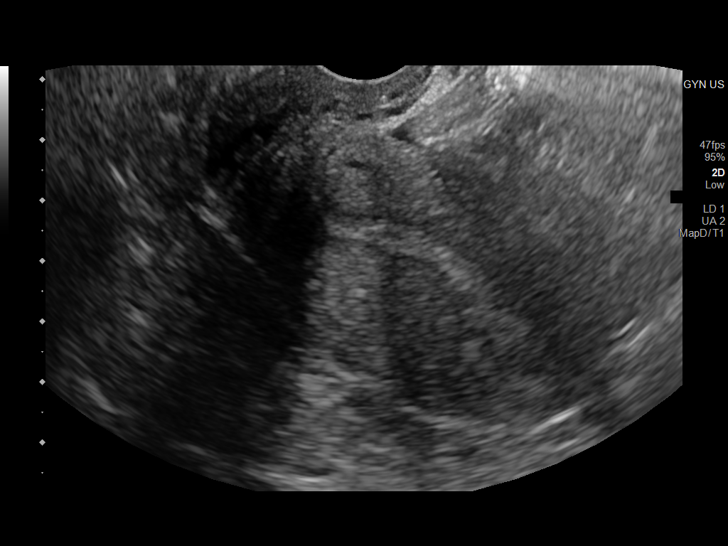
[im 37/64]
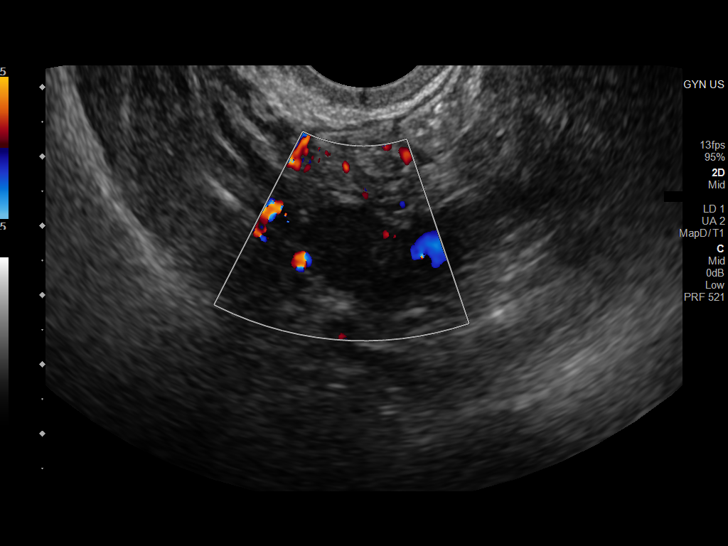
[im 43/64]
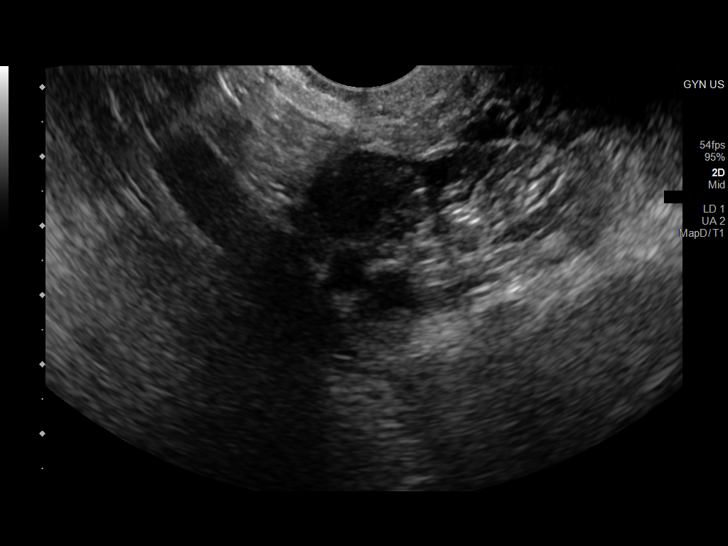
[im 48/64]
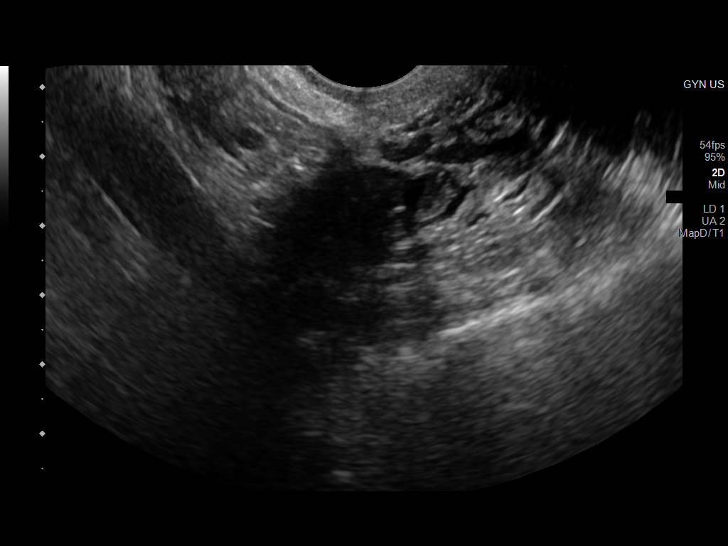
[im 53/64]
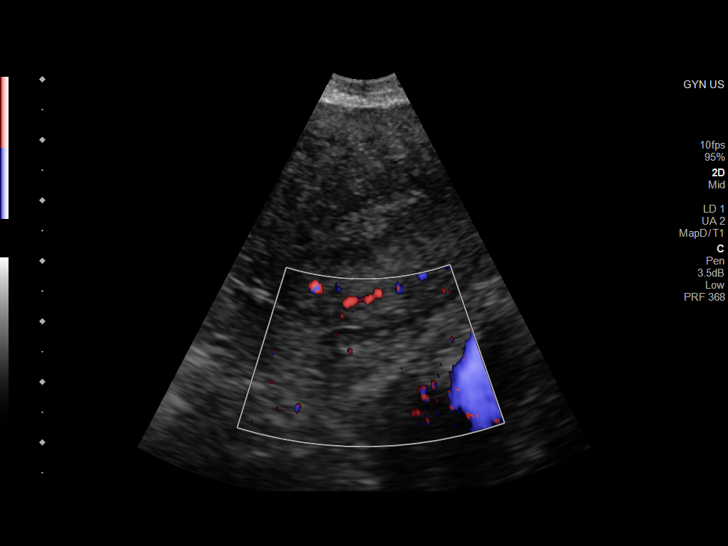
[im 58/64]
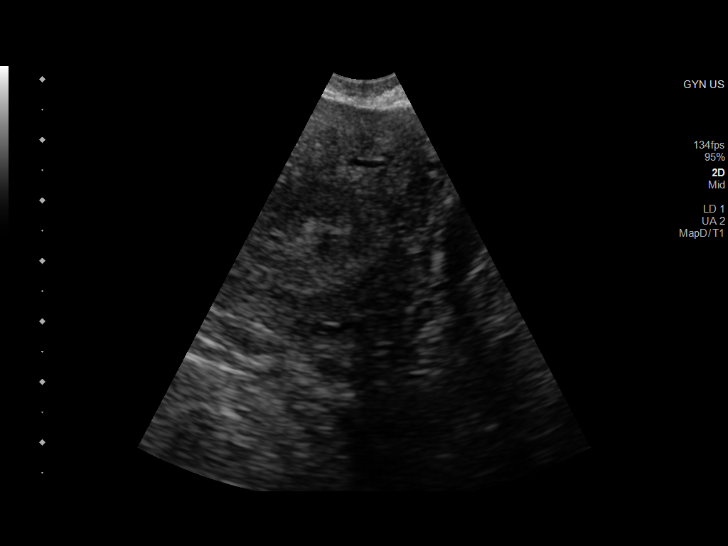
[im 64/64]
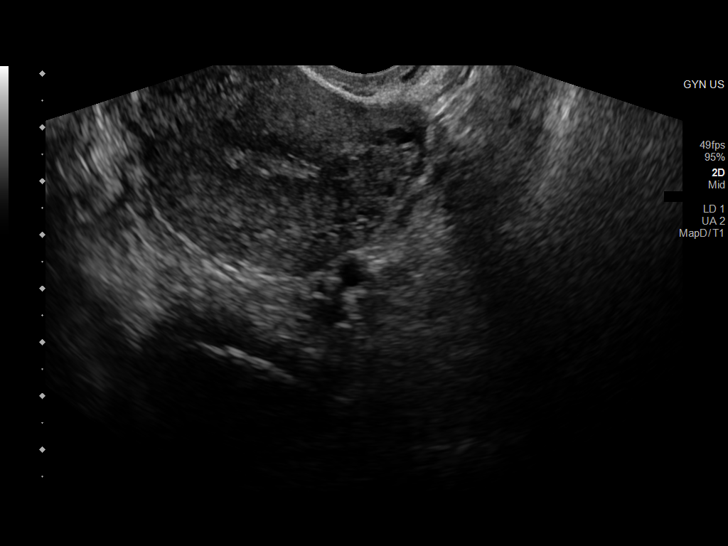

[13 of 25 positions shown; findings below may reference images not displayed]

FINDINGS: Uterus

Measurements: 9.3 x 4.4 x 4.7 cm = volume: 100 mL. Retroverted. No
fibroids or other mass visualized.

Endometrium

Thickness: 4 mm.  No focal abnormality visualized.

Right ovary

Measurements: 2.6 x 1.3 x 1.8 cm = volume: 3 mL. Normal
appearance/no adnexal mass.

Left ovary

Measurements: 1.6 x 0.8 x 1.5 cm = volume: 1 mL. Normal
appearance/no adnexal mass.

Pulsed Doppler evaluation of the right ovary demonstrates normal
low-resistance arterial and venous waveforms. Color Doppler flow is
visualized within the left ovary. Pulsed Doppler interrogation was
unable to be adequately performed secondary to deep location of the
left ovary within the pelvis.

Other findings

No abnormal free fluid.
IMPRESSION: 1. Endometrial thickness of 4 mm, within normal limits. In the
setting of post-menopausal bleeding, this is consistent with a
benign etiology such as endometrial atrophy. If bleeding remains
unresponsive to hormonal or medical therapy, sonohysterogram should
be considered for focal lesion work-up. (Ref: Radiological
Reasoning: Algorithmic Workup of Abnormal Vaginal Bleeding with
Endovaginal Sonography and Sonohysterography. AJR [id number]; 191:S68-73)
2. Unremarkable appearance of the bilateral ovaries. Pulsed Doppler
waveforms were unable to be elicited within the left ovary secondary
to deep location within the pelvis. Color Doppler flow was
visualized. No evidence to suggest adnexal torsion.

## 2022-11-26 ENCOUNTER — Ambulatory Visit (INDEPENDENT_AMBULATORY_CARE_PROVIDER_SITE_OTHER): Payer: Medicaid Other | Admitting: Primary Care

## 2022-11-26 ENCOUNTER — Ambulatory Visit (INDEPENDENT_AMBULATORY_CARE_PROVIDER_SITE_OTHER): Payer: Medicaid Other

## 2022-11-26 DIAGNOSIS — J029 Acute pharyngitis, unspecified: Secondary | ICD-10-CM | POA: Diagnosis not present

## 2022-11-26 MED ORDER — AMOXICILLIN-POT CLAVULANATE 875-125 MG PO TABS: 1.0000 | ORAL_TABLET | Freq: Two times a day (BID) | ORAL | 0 refills | Status: DC

## 2022-11-26 NOTE — Progress Notes (Signed)
  [facility]   Telephone Note I connected with Sheila [name], on 11/26/2022 at 11:39 AM  by telephone and verified that I am speaking with the correct person using two identifiers.   Consent: I discussed the limitations, risks, security and privacy concerns of performing an evaluation and management service by telephone and the availability of in person appointments. I also discussed with the patient that there may be a patient responsible charge related to this service. The patient expressed understanding and agreed to proceed.   Location of Patient: Home  Location of Provider: [facility]   Persons participating in Telemedicine visit: [name],  NP   History of Present Illness: Sheila [name] is a 52 year old female scratchy sore throat.She is able to feel bumps in the back of her throat. No fever , no chills or head aches.    Past Medical History:  Diagnosis Date   Anemia    Anginal pain ([place])    Breast cancer ([place])    Family history of bone cancer    Family history of breast cancer    Family history of prostate cancer    Family history of throat cancer    Malignant neoplasm of upper-outer quadrant of left breast in female, estrogen receptor positive ([place]) 04/08/2021   Allergies  Allergen Reactions   Anesthetics, Amide Other (See Comments)    seizures   Other Other (See Comments)    General Anesthesia caused seizures-Propofol    No current outpatient medications on file prior to visit.   No current facility-administered medications on file prior to visit.    Observations/Objective: See HPI  Assessment and Plan: Diagnoses and all orders for this visit:  Acute pharyngitis, unspecified etiology -     amoxicillin-clavulanate (AUGMENTIN) 875-125 MG tablet; Take 1 tablet by mouth 2 (two) times daily.     Follow Up Instructions: Already schedule    I  discussed the assessment and treatment plan with the patient. The patient was provided an opportunity to ask questions and all were answered. The patient agreed with the plan and demonstrated an understanding of the instructions.   The patient was advised to call back or seek an in-person evaluation if the symptoms worsen or if the condition fails to improve as anticipated.     I provided 12 minutes total of non-face-to-face time during this encounter including median intraservice time, reviewing previous notes, investigations, ordering medications, medical decision making, coordinating care and patient verbalized understanding at the end of the visit.    This note has been created with Surveyor, quantity. Any transcriptional errors are unintentional.   Kerin Perna, NP 11/26/2022, 11:39 AM

## 2022-12-02 ENCOUNTER — Ambulatory Visit (HOSPITAL_COMMUNITY): Payer: Medicaid Other | Attending: Internal Medicine

## 2022-12-02 DIAGNOSIS — R0602 Shortness of breath: Secondary | ICD-10-CM | POA: Insufficient documentation

## 2022-12-02 LAB — ECHOCARDIOGRAM COMPLETE
Area-P 1/2: 3.72 cm2
S' Lateral: 2.5 cm

## 2023-01-14 ENCOUNTER — Other Ambulatory Visit: Payer: Self-pay | Admitting: *Deleted

## 2023-01-14 DIAGNOSIS — R0683 Snoring: Secondary | ICD-10-CM

## 2023-01-18 ENCOUNTER — Telehealth: Payer: Self-pay | Admitting: Internal Medicine

## 2023-01-18 NOTE — Telephone Encounter (Signed)
Pt is returning call about echo results

## 2023-01-18 NOTE — Telephone Encounter (Signed)
Called pt, went over her echo results and Dr. Delphina Cahill recommendations. Pt wants to wait until she is seen before she states medications.

## 2023-01-18 NOTE — Telephone Encounter (Signed)
Patient daughter called in - okay per DPR, discussed results.   Advised upcoming visit on 03/22 with NP, was unsure if MD would like to see patient. Will route to MD/RN. Thanks!

## 2023-01-19 MED ORDER — METOPROLOL TARTRATE 25 MG PO TABS: 25.0000 mg | ORAL_TABLET | Freq: Two times a day (BID) | ORAL | 3 refills | Status: DC | PRN

## 2023-01-19 NOTE — Addendum Note (Signed)
Addended by: Rexanne Mano B on: 01/19/2023 03:01 PM   Modules accepted: Orders

## 2023-01-19 NOTE — Telephone Encounter (Signed)
Sheila [name], MD  [name] (1:08 PM)    I called the patient's daughter at her request to review. No need for right heart cath at this time, she is currently undergoing a sleep evaluation for OSA which is the most likely cause of mild elevation of pulmonary pressure. Please call in metoprolol 25 mg twice daily PRN palpitations, she can take scheduled if she likes, otherwise as needed for symptomatic days is fine. Please push out follow up visit until June/July with me. Daughter agrees. GA   Called and spoke with patients daughter- appointment rescheduled for June with Dr. Margaretann [name]. Medication sent to patient preferred pharmacy, advised her to call back with issues or concerns.

## 2023-01-20 ENCOUNTER — Ambulatory Visit (INDEPENDENT_AMBULATORY_CARE_PROVIDER_SITE_OTHER): Payer: Medicaid Other | Admitting: Primary Care

## 2023-02-01 ENCOUNTER — Ambulatory Visit (HOSPITAL_BASED_OUTPATIENT_CLINIC_OR_DEPARTMENT_OTHER): Payer: Medicaid Other | Admitting: Cardiovascular Disease

## 2023-02-04 ENCOUNTER — Ambulatory Visit: Payer: Medicaid Other | Admitting: Nurse Practitioner

## 2023-02-09 ENCOUNTER — Ambulatory Visit (HOSPITAL_BASED_OUTPATIENT_CLINIC_OR_DEPARTMENT_OTHER): Payer: Medicaid Other | Attending: Internal Medicine | Admitting: Cardiovascular Disease

## 2023-02-09 VITALS — Ht 69.0 in | Wt 210.0 lb

## 2023-02-09 DIAGNOSIS — R0683 Snoring: Secondary | ICD-10-CM | POA: Insufficient documentation

## 2023-02-09 DIAGNOSIS — G4733 Obstructive sleep apnea (adult) (pediatric): Secondary | ICD-10-CM | POA: Insufficient documentation

## 2023-02-25 ENCOUNTER — Encounter (HOSPITAL_BASED_OUTPATIENT_CLINIC_OR_DEPARTMENT_OTHER): Payer: Self-pay | Admitting: Cardiovascular Disease

## 2023-02-25 NOTE — Procedures (Signed)
      Patient Name: Sheila [name], Sheila [name] Date: 02/09/2023 Gender: Female D.O.B: Feb 13, 1971 Age (years): 20 Referring Provider: Weston Brass MD Height (inches): 69 Interpreting Physician: Nicki Guadalajara MD, ABSM Weight (lbs): 209 RPSGT: [place] BMI: 31 MRN: [id number] Neck Size: 15.00  CLINICAL INFORMATION Sleep Study Type: HST  Indication for sleep study: snoring  Epworth Sleepiness Score: 1  SLEEP STUDY TECHNIQUE A multi-channel overnight portable sleep study was performed. The channels recorded were: nasal airflow, thoracic respiratory movement, and oxygen saturation with a pulse oximetry. Snoring was also monitored.  MEDICATIONS amoxicillin-clavulanate (AUGMENTIN) 875-125 MG tablet metoprolol tartrate (LOPRESSOR) 25 MG tablet Patient self administered medications include: N/A.  SLEEP ARCHITECTURE Patient was studied for 434.5 minutes. The sleep efficiency was 100.0 % and the patient was supine for 0%. The arousal index was 0.0 per hour.  RESPIRATORY PARAMETERS The overall AHI was 13.0 per hour, with a central apnea index of 0 per hour.  The oxygen nadir was 91% during sleep.  CARDIAC DATA Mean heart rate during sleep was 76.7 bpm. Heart rate range:  66 - 102 bpm.  IMPRESSIONS - Mild obstructive sleep apnea occurred during this study (AHI 13.0/h). - The patient had minimal or no oxygen desaturation during the study (Min O2 91%) - Patient snored for 98.8 minutes (22.7%) during the sleep.  DIAGNOSIS - Obstructive Sleep Apnea (G47.33)  RECOMMENDATIONS - Therapeutic CPAP titration to determine optimal pressure required to alleviate sleep disordered breathing. Initiate a trial of Auto-PAP with EPR of 3 at 6 - 16 cm of water. If the patient is against CPAP initiation, alternative therapy with a customized oral appliance may be considered.  - Effort should be made to optimize nasal and oropharyngeal patency. - Positional therapy avoiding supine  position during sleep. - Avoid alcohol, sedatives and other CNS depressants that may worsen sleep apnea and disrupt normal sleep architecture. - Sleep hygiene should be reviewed to assess factors that may improve sleep quality. - Weight management (BMI 31) and regular exercise should be initiated or continued. - Recommend a download and sleep clinic evaluation after 4 - 6 weeks of therapy.  [Electronically signed] 02/25/2023 05:24 PM  Nicki Guadalajara MD, [facility], ABSM Diplomate, [name] Board of Sleep Medicine  NPI: [id number]  [facility] PH: [phone]   FX: [phone] ACCREDITED BY THE [name] ACADEMY OF SLEEP MEDICINE

## 2023-03-07 ENCOUNTER — Telehealth: Payer: Self-pay | Admitting: *Deleted

## 2023-03-07 NOTE — Telephone Encounter (Signed)
Both patient and daughter, Sheila [name] notified of HST results and recommendations. Patient elects to do CPAP. Due to her insurance not covering titration sleep study APAP order has been sent to [facility] per her request via [facility] portal.

## 2023-03-10 ENCOUNTER — Other Ambulatory Visit: Payer: Self-pay | Admitting: Primary Care

## 2023-03-10 ENCOUNTER — Ambulatory Visit
Admission: RE | Admit: 2023-03-10 | Discharge: 2023-03-10 | Disposition: A | Discharge status: Home / Self Care | Payer: Medicaid Other | Source: Ambulatory Visit | Attending: Primary Care | Admitting: Primary Care

## 2023-03-10 ENCOUNTER — Telehealth: Payer: Self-pay | Admitting: *Deleted

## 2023-03-10 DIAGNOSIS — Z1231 Encounter for screening mammogram for malignant neoplasm of breast: Secondary | ICD-10-CM

## 2023-03-10 NOTE — Telephone Encounter (Signed)
Received a call from the patient's daughter, Sheila [name] informing me that the patient does not have any insurance. Sheila [name]  states that she herself is no longer using her CPAP due to a 40 pound weight loss. She feels that she is "sleeping fine." She plans to let the patient use her CPAP machine. She was informed that if she plans to do this she will need to have [facility] to change the information in [facility]. Contact me once it has been done and I can set the pressures as prescribed by Dr Tresa Endo remotely. Sheila [name] voiced understanding and states she will contact [employer] tomorrow to make changes.

## 2023-03-23 ENCOUNTER — Encounter: Payer: Self-pay | Admitting: Internal Medicine

## 2023-04-26 ENCOUNTER — Ambulatory Visit: Payer: Medicaid Other | Attending: Internal Medicine | Admitting: Internal Medicine

## 2023-04-26 ENCOUNTER — Encounter: Payer: Self-pay | Admitting: Internal Medicine

## 2023-04-26 VITALS — BP 128/78 | Ht 69.0 in | Wt 208.9 lb

## 2023-04-26 DIAGNOSIS — R0609 Other forms of dyspnea: Secondary | ICD-10-CM

## 2023-04-26 DIAGNOSIS — R002 Palpitations: Secondary | ICD-10-CM

## 2023-04-26 DIAGNOSIS — E782 Mixed hyperlipidemia: Secondary | ICD-10-CM

## 2023-04-26 DIAGNOSIS — R0602 Shortness of breath: Secondary | ICD-10-CM | POA: Diagnosis not present

## 2023-04-26 DIAGNOSIS — Z170 Estrogen receptor positive status [ER+]: Secondary | ICD-10-CM

## 2023-04-26 DIAGNOSIS — R072 Precordial pain: Secondary | ICD-10-CM

## 2023-04-26 DIAGNOSIS — R0683 Snoring: Secondary | ICD-10-CM | POA: Diagnosis not present

## 2023-04-26 DIAGNOSIS — C50412 Malignant neoplasm of upper-outer quadrant of left female breast: Secondary | ICD-10-CM

## 2023-04-26 DIAGNOSIS — Z79899 Other long term (current) drug therapy: Secondary | ICD-10-CM

## 2023-04-26 DIAGNOSIS — E7800 Pure hypercholesterolemia, unspecified: Secondary | ICD-10-CM

## 2023-04-26 MED ORDER — DILTIAZEM HCL ER COATED BEADS 120 MG PO CP24: 120.0000 mg | ORAL_CAPSULE | Freq: Every day | ORAL | 3 refills | Status: DC

## 2023-04-26 NOTE — Progress Notes (Signed)
Cardiology Office Note:    Date:  04/26/2023   ID:  Sheila [name], DOB 1971/01/18, MRN [id number]  PCP:  [name], NP  Cardiologist:  Parke Poisson, MD  Electrophysiologist:  None   Referring MD: [name], NP   Chief Complaint/Reason for Referral: Follow up; left breast cancer  History of Present Illness:    Sheila [name] is a 52 y.o. female with a history of history of anemia and impaired fasting glucose who presents for follow up. She has been diagnosed with a malignant neoplasm of upper-outer quadrant of left breast, estrogen receptor positive in late May 2022, followed by Dr. 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 She has been given adjuvant antiestrogen therapy, she has also been prescribed radiation therapy.   04/26/23: Reviewed echo and monitor results. Non worrisome but does have a hyperdynamic ventricle and symptomatic PVCs. Unable to obtain metoprolol per daughter due to insurance issues. Will attempt diltiazem for symptomatic palpitations.   No chest pain, no worrisome shortness of breath. Does have apneic episodes but feels better with use of CPAP, however feels like she gets congested from cpap use.    Prior visits:  Patient was last seen by me 07/2021 and was doing well at that time. She was seen in the ED on 10/06/22 for left lateral chest pain below her left axilla.  Today, she states that she is doing okay. She reports that she cannot walk long distances without significant shortness of breath. She has to take breaks when grocery shopping. She does have episodes of chest pain at night occasionally. She has had episodes of PND and orthopnea. She does snore and has had witnessed apneic episodes. Patient's daughter, Sheila [name], states that there is an active order for a sleep study. She has daily episodes of palpitations while sitting at rest.  She has frequent headaches and  history of seizure which concerns her daughter. She has had MRIs and CTs, and is followed by neurology.   In regards to her history of left breast cancer, she did have radiotherapy. She is not on any antiestrogens as she did not tolerate Tamoxifen. She had negative genetic testing. She last saw her surgeon Dr. Dwain Sarna for follow up on 09/01/22. She last saw her oncologist Dr. Mosetta Putt on 05/07/22 who started her on Aromasin 25mg  daily.  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    Past Surgical History:  Procedure Laterality Date   AXILLARY LYMPH NODE BIOPSY Left 04/29/2021   Procedure: AXILLARY LYMPH NODE BIOPSY;  Surgeon: Emelia Loron, MD;  Location: [facility] OR;  Service: General;  Laterality: Left;   BREAST LUMPECTOMY WITH RADIOACTIVE SEED AND SENTINEL LYMPH NODE BIOPSY Left 04/29/2021   Procedure: LEFT BREAST LUMPECTOMY WITH RADIOACTIVE SEED;  Surgeon: Emelia Loron, MD;  Location: [facility] OR;  Service: General;  Laterality: Left;   CARDIAC CATHETERIZATION     LEFT HEART CATHETERIZATION WITH CORONARY ANGIOGRAM N/A 05/30/2014   Procedure: LEFT HEART CATHETERIZATION WITH CORONARY ANGIOGRAM;  Surgeon: Peter M Swaziland, MD;  Location: [facility] C[facility] LAB;  Service:  Cardiovascular;  Laterality: N/A;   TUBAL LIGATION      Current Medications: Current Meds  Medication Sig   diltiazem (CARDIZEM CD) 120 MG 24 hr capsule Take 1 capsule (120 mg total) by mouth daily.   Outpatient Encounter Medications as of 04/26/2023  Medication Sig   diltiazem (CARDIZEM CD) 120 MG 24 hr capsule Take 1 capsule (120 mg total) by mouth daily.   amoxicillin-clavulanate (AUGMENTIN) 875-125 MG tablet Take 1 tablet by mouth 2  (two) times daily. (Patient not taking: Reported on 04/26/2023)   [DISCONTINUED] metoprolol tartrate (LOPRESSOR) 25 MG tablet Take 1 tablet (25 mg total) by mouth 2 (two) times daily as needed (for palpitations). (Patient not taking: Reported on 04/26/2023)   No facility-administered encounter medications on file as of 04/26/2023.     Allergies:   Anesthetics, amide and Other   Social History   Tobacco Use   Smoking status: Never   Smokeless tobacco: Never  Vaping Use   Vaping Use: Never used  Substance Use Topics   Alcohol use: No   Drug use: No     Family History: The patient's family history includes Bone cancer (age of onset: 69) in her nephew; Breast cancer in her cousin and other family members; Breast cancer (age of onset: 28) in her mother; Breast cancer (age of onset: 81) in her maternal grandmother; Cancer in her paternal aunt, paternal uncle, paternal uncle, and other family members; Colon cancer in her maternal uncle; Crohn's disease in her brother and sister; Diabetes in her father; Heart Problems in her mother; Heart disease in her mother and sister; Hyperlipidemia in her mother; Hypertension in her maternal aunt, maternal grandmother, maternal uncle, and mother; Pancreatic cancer in her cousin; Prostate cancer in her maternal uncle, maternal uncle and another family member; Seizures in her father; Sickle cell anemia in an other family member; Stroke in her maternal grandfather and maternal grandmother; Throat cancer in her paternal uncle. There is no history of Esophageal cancer, Stomach cancer, or Rectal cancer.  ROS:   Please see the history of present illness.   + palpitations  All other systems reviewed and are negative.  EKGs/Labs/Other Studies Reviewed:    The following studies were reviewed today:  EKG:  04/26/23 - NSR, nonspecific T wave abnl.  EKG ordered today, 10/29/22.  The ekg ordered today demonstrates sinus rhythm, ST and T wave abnormality inferior and  anterolateral. 08/06/21: NSR, T wave abnl laterally, no change from prior.  Recent Labs: 05/07/2022: ALT 18 10/06/2022: BUN 10; Creatinine, Ser 0.94; Hemoglobin 12.9; Platelets 304; Potassium 3.8; Sodium 139  Recent Lipid Panel    Component Value Date/Time   CHOL 213 (H) 06/30/2020 1553   TRIG 93 06/30/2020 1553   HDL 55 06/30/2020 1553   CHOLHDL 3.9 06/30/2020 1553   CHOLHDL 3.4 05/31/2014 0433   VLDL 13 05/31/2014 0433   LDLCALC 141 (H) 06/30/2020 1553    Physical Exam:    VS:  BP 128/78 (BP Location: Right Arm, Patient Position: Sitting, Cuff Size: Normal)   Ht 5\' 9"  (1.753 m)   Wt 208 lb 14.4 oz (94.8 kg)   LMP 04/03/2018 (Within Days)   SpO2 98%   BMI 30.85 kg/m     Wt Readings from Last 5 Encounters:  04/26/23 208 lb 14.4 oz (94.8 kg)  02/09/23 210 lb (95.3 kg)  02/01/23 210 lb (95.3 kg)  10/29/22 209 lb (94.8 kg)  10/06/22 210 lb (95.3 kg)    Constitutional: No acute distress  Eyes: sclera non-icteric, normal conjunctiva and lids ENMT: normal dentition, moist mucous membranes Chest: large horizontally-oriented keloid scar across precordium Cardiovascular: regular rhythm, normal rate, no murmurs. S1 and S2 normal. No jugular venous distention.  Respiratory: clear to auscultation bilaterally GI : normal bowel sounds, soft and nontender. No distention.   MSK: extremities warm, well perfused. No edema.  NEURO: grossly nonfocal exam, moves all extremities. PSYCH: alert and oriented x 3, normal mood and affect.   ASSESSMENT:    1. Palpitations   2. Shortness of breath   3. Snoring   4. Dyspnea on exertion   5. Malignant neoplasm of upper-outer quadrant of left breast in female, estrogen receptor positive (HCC)   6. Precordial pain   7. Mixed hyperlipidemia   8. Pure hypercholesterolemia   9. Medication management     PLAN:    Chest pain  -no significant recurrence. 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   Left breast cancer -followed by oncology, Dr.  Mosetta Putt. Completed Radiotherapy. Recently started Aromasin. -Last echo in 2024 with hyperdyanamic lv function, normal strain and mildly elevated rvsp.   Shortness of breath with exertion Palpitations - no worrisome findings on testing  - for symptoms we will use diltiazem 120 mg XL given patient issues obtaining metoprolol.   Shortness of breath Snoring PND Has osa, trying to use CPAP. Feels better with it on.   HLD  -Recommend repeat lipids with PCP.   Weston Brass, MD, [facility]  [facility]      Medication Adjustments/Labs and Tests Ordered: Current medicines are reviewed at length with the patient today.  Concerns regarding medicines are outlined above.   Orders Placed This Encounter  Procedures   EKG 12-Lead     Meds ordered this encounter  Medications   diltiazem (CARDIZEM CD) 120 MG 24 hr capsule    Sig: Take 1 capsule (120 mg total) by mouth daily.    Dispense:  90 capsule    Refill:  3     Patient Instructions  Medication Instructions:  START: DILTIAZEM 120mg  ONCE DAILY  *If you need a refill on your cardiac medications before your next appointment, please call your pharmacy*  Follow-Up: At [facility], you and your health needs are our priority.  As part of our continuing mission to provide you with exceptional heart care, we have created designated Provider Care Teams.  These Care Teams include your primary Cardiologist (physician) and Advanced Practice Providers (APPs -  Physician Assistants and Nurse Practitioners) who all work together to provide you with the care you need, when you need it.  Your next appointment:   6 month(s)  Provider:   Parke Poisson, MD

## 2023-04-26 NOTE — Patient Instructions (Signed)
Medication Instructions:  START: DILTIAZEM 120mg  ONCE DAILY  *If you need a refill on your cardiac medications before your next appointment, please call your pharmacy*  Follow-Up: At [facility], you and your health needs are our priority.  As part of our continuing mission to provide you with exceptional heart care, we have created designated Provider Care Teams.  These Care Teams include your primary Cardiologist (physician) and Advanced Practice Providers (APPs -  Physician Assistants and Nurse Practitioners) who all work together to provide you with the care you need, when you need it.  Your next appointment:   6 month(s)  Provider:   Parke Poisson, MD

## 2023-05-12 ENCOUNTER — Other Ambulatory Visit: Payer: Self-pay | Admitting: Obstetrics and Gynecology

## 2023-05-12 DIAGNOSIS — Z1231 Encounter for screening mammogram for malignant neoplasm of breast: Secondary | ICD-10-CM

## 2023-06-20 ENCOUNTER — Ambulatory Visit
Admission: RE | Admit: 2023-06-20 | Discharge: 2023-06-20 | Disposition: A | Discharge status: Home / Self Care | Payer: Medicaid Other | Source: Ambulatory Visit | Attending: Obstetrics and Gynecology | Admitting: Obstetrics and Gynecology

## 2023-06-20 ENCOUNTER — Telehealth: Payer: Self-pay

## 2023-06-20 ENCOUNTER — Encounter: Payer: Self-pay | Admitting: Internal Medicine

## 2023-06-20 ENCOUNTER — Other Ambulatory Visit: Payer: Self-pay | Admitting: Obstetrics and Gynecology

## 2023-06-20 DIAGNOSIS — N644 Mastodynia: Secondary | ICD-10-CM

## 2023-06-20 DIAGNOSIS — Z1231 Encounter for screening mammogram for malignant neoplasm of breast: Secondary | ICD-10-CM

## 2023-06-20 NOTE — Telephone Encounter (Signed)
Spoke with pt regarding on going palpitations and discomfort that these are causing her. Pt states that she was unable to get diltiazem because medicaid wouldn't pay for this. She also mentions that she was unable to get metoprolol tartrate because this is covered on her insurance plan either. Explained that we would need to get her back for a visit. She would like for me to call her daughter [name]) to set this up.   Left message for daughter to call back to schedule office visit with APP.

## 2023-06-22 ENCOUNTER — Ambulatory Visit: Payer: Medicaid Other | Admitting: Internal Medicine

## 2023-07-18 ENCOUNTER — Encounter (HOSPITAL_COMMUNITY): Payer: Self-pay | Admitting: *Deleted

## 2023-07-18 ENCOUNTER — Ambulatory Visit (HOSPITAL_COMMUNITY)
Admission: EM | Admit: 2023-07-18 | Discharge: 2023-07-18 | Disposition: A | Discharge status: Home / Self Care | Payer: Medicaid Other | Attending: Family Medicine | Admitting: Family Medicine

## 2023-07-18 DIAGNOSIS — Z205 Contact with and (suspected) exposure to viral hepatitis: Secondary | ICD-10-CM | POA: Diagnosis present

## 2023-07-18 LAB — HEPATITIS B CORE ANTIBODY, TOTAL: Hep B Core Total Ab: REACTIVE — AB

## 2023-07-18 LAB — HEPATITIS C ANTIBODY: HCV Ab: NONREACTIVE

## 2023-07-18 LAB — HEPATITIS B SURFACE ANTIBODY,QUALITATIVE: Hep B S Ab: REACTIVE — AB

## 2023-07-18 LAB — HEPATITIS B SURFACE ANTIGEN: Hepatitis B Surface Ag: NONREACTIVE

## 2023-07-18 NOTE — Discharge Instructions (Addendum)
We have drawn blood to check for hepatitis B antigens and antibodies and for hepatitis C antibody. Our staff should call you if anything is positive.  Results also will go to your MyChart.

## 2023-07-18 NOTE — ED Triage Notes (Signed)
Pt states her husband tested positive for hep B and she needs tested.

## 2023-07-18 NOTE — ED Provider Notes (Signed)
[facility]    CSN: [id number] Arrival date & time: 07/18/23  1340      History   Chief Complaint Chief Complaint  Patient presents with   Labs Only    HPI Sheila [name] is a 52 y.o. female.   HPI Here for exposure to hepatitis B.  Patient's husband recently had results in his MyChart that he is positive for hepatitis B surface antigen and surface antibody and for core antibody.  Patient had negative testing about 2 years ago.  She is not having any nausea or vomiting or jaundice or weakness.  Also no diarrhea.    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    Patient Active Problem List   Diagnosis Date Noted   Seizure-like activity (HCC) 08/09/2021   Postmenopausal bleeding 06/02/2021   Genetic testing 04/21/2021   Family history of breast cancer    Family history of bone cancer    Family history of prostate cancer    Family history of throat cancer    Malignant neoplasm of upper-outer quadrant of left breast in female, 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   Women's annual routine gynecological examination 02/25/2021   Screening examination for STD (sexually transmitted disease) 02/25/2021   Skin lesion 02/25/2021   Chest pain at rest 05/30/2014   Ejection fraction    Abnormal EKG 04/24/2014   Chest discomfort 04/23/2014   Panic attack 04/23/2014   OBESITY 01/15/2009   HAIR LOSS 01/15/2009   ANEMIA-NOS 01/14/2009    Past Surgical History:  Procedure Laterality Date   AXILLARY LYMPH NODE BIOPSY Left 04/29/2021   Procedure: AXILLARY LYMPH NODE BIOPSY;  Surgeon: Emelia Loron, MD;  Location: [facility] OR;  Service: General;   Laterality: Left;   BREAST LUMPECTOMY WITH RADIOACTIVE SEED AND SENTINEL LYMPH NODE BIOPSY Left 04/29/2021   Procedure: LEFT BREAST LUMPECTOMY WITH RADIOACTIVE SEED;  Surgeon: Emelia Loron, MD;  Location: [facility] OR;  Service: General;  Laterality: Left;   CARDIAC CATHETERIZATION     LEFT HEART CATHETERIZATION WITH CORONARY ANGIOGRAM N/A 05/30/2014   Procedure: LEFT HEART CATHETERIZATION WITH CORONARY ANGIOGRAM;  Surgeon: Peter M Swaziland, MD;  Location: [facility] CATH LAB;  Service: Cardiovascular;  Laterality: N/A;   TUBAL LIGATION      OB History     Gravida  8   Para  6   Term  4   Preterm  2   AB  2   Living  4      SAB  2   IAB      Ectopic      Multiple      Live Births               Home Medications    Prior to Admission medications   Medication Sig Start Date End Date Taking? Authorizing Provider  diltiazem (CARDIZEM CD) 120 MG 24 hr capsule Take 1 capsule (120 mg total) by mouth daily. 04/26/23   Parke Poisson, MD    Family History Family History  Problem Relation Age of Onset   Heart disease Mother    Hypertension Mother  Heart Problems Mother    Hyperlipidemia Mother    Breast cancer Mother 14   Diabetes Father    Seizures Father    Heart disease Sister    Crohn's disease Sister    Crohn's disease Brother    Hypertension Maternal Aunt    Colon cancer Maternal Uncle    Hypertension Maternal Uncle    Prostate cancer Maternal Uncle    Prostate cancer Maternal Uncle    Cancer Paternal Aunt        unknown type   Cancer Paternal Uncle        unknown type   Cancer Paternal Uncle        unknown type   Throat cancer Paternal Uncle    Hypertension Maternal Grandmother    Stroke Maternal Grandmother    Breast cancer Maternal Grandmother 17   Stroke Maternal Grandfather    Pancreatic cancer Cousin    Breast cancer Cousin        dx 9s, bilateral mastectomies (maternal first cousin)   Bone cancer Nephew 11       knee, great-nephew   Breast  cancer Other        great-aunt (MGF's sister)   Breast cancer Other        first cousin once removed (MGF's niece)   Breast cancer Other        first cousin once removed (MGF's niece)   Cancer Other        unknown type, great-uncle (MGM's brother)   Breast cancer Other        first cousin once removed (MGM's niece)   Breast cancer Other        first cousin once removed (MGM's niece)   Prostate cancer Other        first cousin once removed (MGM's nephew)   Breast cancer Other        second cousin (MGM's great-niece)   Cancer Other        unknown type, dx 24s (maternal aunt's granddaughter)   Sickle cell anemia Other    Esophageal cancer Neg Hx    Stomach cancer Neg Hx    Rectal cancer Neg Hx     Social History Social History   Tobacco Use   Smoking status: Never   Smokeless tobacco: Never  Vaping Use   Vaping status: Never Used  Substance Use Topics   Alcohol use: No   Drug use: No     Allergies   Anesthetics, amide and Other   Review of Systems Review of Systems   Physical Exam Triage Vital Signs ED Triage Vitals  Encounter Vitals Group     BP 07/18/23 1413 139/81     Systolic BP Percentile --      Diastolic BP Percentile --      Pulse Rate 07/18/23 1413 76     Resp 07/18/23 1413 18     Temp 07/18/23 1413 98.4 F (36.9 C)     Temp Source 07/18/23 1413 Oral     SpO2 07/18/23 1413 97 %     Weight --      Height --      Head Circumference --      Peak Flow --      Pain Score 07/18/23 1412 0     Pain Loc --      Pain Education --      Exclude from Growth Chart --    No data found.  Updated Vital Signs BP 139/81 (BP Location:  Right Arm)   Pulse 76   Temp 98.4 F (36.9 C) (Oral)   Resp 18   LMP 04/03/2018 (Within Days)   SpO2 97%   Visual Acuity Right Eye Distance:   Left Eye Distance:   Bilateral Distance:    Right Eye Near:   Left Eye Near:    Bilateral Near:     Physical Exam Vitals reviewed.  Constitutional:      General: She is  not in acute distress.    Appearance: She is not toxic-appearing.  HENT:     Mouth/Throat:     Mouth: Mucous membranes are moist.  Skin:    Coloration: Skin is not jaundiced.  Neurological:     Mental Status: She is alert and oriented to person, place, and time.      UC Treatments / Results  Labs (all labs ordered are listed, but only abnormal results are displayed) Labs Reviewed  HEPATITIS B SURFACE ANTIGEN  HEPATITIS B SURFACE ANTIBODY,QUALITATIVE  HEPATITIS B CORE ANTIBODY, TOTAL  HEPATITIS C ANTIBODY    EKG   Radiology No results found.  Procedures Procedures (including critical care time)  Medications Ordered in UC Medications - No data to display  Initial Impression / Assessment and Plan / UC Course  I have reviewed the triage vital signs and the nursing notes.  Pertinent labs & imaging results that were available during my care of the patient were reviewed by me and considered in my medical decision making (see chart for details).        Blood is drawn to check hepatitis B surface antigen and antibody and core antibody and hepatitis C antibody.  We will notify her if anything is positive.   Final Clinical Impressions(s) / UC Diagnoses   Final diagnoses:  Exposure to hepatitis     Discharge Instructions      We have drawn blood to check for hepatitis B antigens and antibodies and for hepatitis C antibody. Our staff should call you if anything is positive.  Results also will go to your MyChart.        ED Prescriptions   None    PDMP not reviewed this encounter.   Zenia Resides, MD 07/18/23 [phone]

## 2023-07-23 NOTE — Progress Notes (Unsigned)
Cardiology Clinic Note   Date: 07/25/2023 ID: Sheila [name], DOB May 22, 1971, MRN [id number]  Primary Cardiologist:  Parke Poisson, MD  Patient Profile    Sheila [name] is a 52 y.o. female who presents to the clinic today for increased palpitations.     Past medical history significant for: Chest pain/dyspnea. Coronary CTA 05/26/2020: Coronary calcium score 0.  Minimal atherosclerotic plaque proximal RCA <25%. Echo 12/02/2022: EF 70 to 75%.  No RWMA.  Normal strain.  Normal RV function.  Mildly elevated PA pressure.  Trivial MR. Palpitations. 3-day ZIO 11/16/2022: Heart rate 60 to 174 bpm.  1 episode of SVT, 7 beats, average 165 bpm.  Rare ventricular ectopy.  Symptoms correlate with infrequent ventricular ectopy. Anemia. OSA. On CPAP with 100% adherence.  Left breast cancer.     History of Present Illness    Sheila [name] was first evaluated by Dr. Jacques Navy on 04/25/2020 for chest pain at the request of Dr. Stevie Kern.  Patient reported a significant amount of stress in her life.  She reported a several month history of substernal chest pressure particularly in times of stress.  It can also occur with rest and worsened by exertion.  Alka-Seltzer helps symptoms.  She has a strong family history of heart disease.  She presented to cardiology in 2015 with chest pain and had an abnormal dobutamine stress echo.  She underwent LHC which showed normal coronary arteries.  It was felt to possibly be related to a GI source or keloid scar on her chest.  She underwent echo which showed normal LV/RV function, no RWMA, mild concentric LVH, normal PA pressure, mild to moderate TR.  Patient was seen for follow-up on 10/29/2022 by Dr. Jacques Navy.  She complained of dyspnea with walking long distances as well as needing to take breaks when grocery shopping.  She reports episodes of chest pain at night with episodes of PND and orthopnea.  She does snore and has witnessed apneic episodes.  She  also reported daily episodes of palpitations while at rest.  Echo and 3-day ZIO ordered (details above).  Patient was also referred for sleep study and found to have mild OSA and CPAP was recommended.  Patient was last seen in the office by Dr. Jacques Navy on 04/26/2023 for follow-up.  She was started on diltiazem for palpitations.  Patient contacted the office on 06/20/2023 to report continued discomfort with palpitations.  She was unable to get diltiazem or metoprolol tartrate secondary to Medicaid not covering it.  She was scheduled for follow-up.  Today, patient is accompanied by her husband. She reports continued palpitations described as fluttering in her chest. There is no pattern to them. Patient denies shortness of breath or dyspnea on exertion. No chest pain, pressure, or tightness. Denies lower extremity edema, orthopnea, or PND. Discussed with pharm D who discovered patient has Medicaid Family Planning which does not cover medications. Metoprolol tartrate is on the $10 list at [facility].  Patient is under a lot of stress. She and her husband just tested positive for hepatitis B. She has a visit with PCP tomorrow.    ROS: All other systems reviewed and are otherwise negative except as noted in History of Present Illness.  Studies Reviewed       EKG is not ordered today.         Physical Exam    VS:  BP 116/80   Pulse 82   Ht 5\' 9"  (1.753 m)   Wt 206 lb 6.4  oz (93.6 kg)   LMP 04/03/2018 (Within Days)   SpO2 99%   BMI 30.48 kg/m  , BMI Body mass index is 30.48 kg/m.  GEN: Well nourished, well developed, in no acute distress. Neck: No JVD or carotid bruits. Cardiac:  RRR. No murmurs. No rubs or gallops.   Respiratory:  Respirations regular and unlabored. Clear to auscultation without rales, wheezing or rhonchi. GI: Soft, nontender, nondistended. Extremities: Radials/DP/PT 2+ and equal bilaterally. No clubbing or cyanosis. No edema.  Skin: Warm and dry, no rash. Neuro: Strength  intact.  Assessment & Plan    Palpitations.  3-day ZIO showed symptoms correlated with infrequent ventricular ectopy.  She had 1 episode of SVT, 7 beats with which she was asymptomatic.  Patient reports continued palpitations described as fluttering in her chest. There is no pattern to episodes.  She was unable to get diltiazem or metoprolol tartrate secondary to Medicaid not covering it. Reached out to Pharm.D. who suggested patient pay out of pocket for metoprolol tartrate 25 mg bid. Will get that sent in for her.  Chest pain.  Coronary CTA July 2021 showed coronary calcium score of 0 with minimal plaque in proximal RCA.  Echo January 2024 showed hyperdynamic LV function, normal RV function, no RWMA, mildly elevated PA pressure.  Patient denies further chest pain. 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 No further testing indicated at this time.   Disposition: Metoprolol tartrate 25 mg bid. Return in 3 months or sooner as needed.          Signed, Etta Grandchild. Aleane Wesenberg, DNP, NP-C

## 2023-07-25 ENCOUNTER — Other Ambulatory Visit (HOSPITAL_COMMUNITY): Payer: Self-pay

## 2023-07-25 ENCOUNTER — Ambulatory Visit: Payer: Medicaid Other | Attending: Internal Medicine | Admitting: Student

## 2023-07-25 VITALS — BP 116/80 | HR 82 | Ht 69.0 in | Wt 206.4 lb

## 2023-07-25 DIAGNOSIS — R002 Palpitations: Secondary | ICD-10-CM | POA: Insufficient documentation

## 2023-07-25 DIAGNOSIS — R079 Chest pain, unspecified: Secondary | ICD-10-CM | POA: Insufficient documentation

## 2023-07-25 MED ORDER — METOPROLOL TARTRATE 25 MG PO TABS
25.0000 mg | ORAL_TABLET | Freq: Two times a day (BID) | ORAL | 3 refills | Status: DC
Start: 2023-07-25 — End: 2023-11-10

## 2023-07-25 NOTE — Patient Instructions (Addendum)
Medication Instructions:  YOUR MEDICATION HAS BEEN SENT TO YOUR LOCAL PHARMACY  metoprolol tartrate (LOPRESSOR) 25 MG tab       Take 1 tablet (25 mg total) by mouth 2 (two) times daily.,       *If you need a refill on your cardiac medications before your next appointment, please call your pharmacy*   Lab Work: No labs ordered If you have labs (blood work) drawn today and your tests are completely normal, you will receive your results only by: MyChart Message (if you have MyChart) OR A paper copy in the mail If you have any lab test that is abnormal or we need to change your treatment, we will call you to review the results.   Testing/Procedures: None   Follow-Up: At [facility], you and your health needs are our priority.  As part of our continuing mission to provide you with exceptional heart care, we have created designated Provider Care Teams.  These Care Teams include your primary Cardiologist (physician) and Advanced Practice Providers (APPs -  Physician Assistants and Nurse Practitioners) who all work together to provide you with the care you need, when you need it.  We recommend signing up for the patient portal called "MyChart".  Sign up information is provided on this After Visit Summary.  MyChart is used to connect with patients for Virtual Visits (Telemedicine).  Patients are able to view lab/test results, encounter notes, upcoming appointments, etc.  Non-urgent messages can be sent to your provider as well.   To learn more about what you can do with MyChart, go to [email or web address].    Your next appointment:   3 month(s)  Provider:   Parke Poisson, MD or APP

## 2023-07-26 ENCOUNTER — Encounter (INDEPENDENT_AMBULATORY_CARE_PROVIDER_SITE_OTHER): Payer: Self-pay

## 2023-07-26 ENCOUNTER — Ambulatory Visit (INDEPENDENT_AMBULATORY_CARE_PROVIDER_SITE_OTHER): Payer: Medicaid Other | Admitting: Primary Care

## 2023-08-03 ENCOUNTER — Ambulatory Visit (INDEPENDENT_AMBULATORY_CARE_PROVIDER_SITE_OTHER): Payer: Medicaid Other | Admitting: Primary Care

## 2023-08-03 VITALS — BP 112/79 | HR 87 | Resp 16 | Ht 69.0 in | Wt 204.2 lb

## 2023-08-03 DIAGNOSIS — B1910 Unspecified viral hepatitis B without hepatic coma: Secondary | ICD-10-CM | POA: Diagnosis not present

## 2023-08-03 NOTE — Patient Instructions (Signed)
Hepatitis B  Hepatitis B is a liver infection that is caused by a germ (virus). Hepatitis B can spread from person to person. This means it is contagious. There are two kinds of hepatitis B: Acute hepatitis B. This may last for 6 months or less. Long-term, or chronic, hepatitis B. This lasts for more than 6 months. It can lead to serious liver problems. What are the causes? This condition is caused by a germ. The germ may spread through: Contact with the body fluids of someone who has the germ. This includes: Blood. Breast milk. Tears or saliva. Semen or fluids from the vagina. Childbirth. A woman can pass the germ to her baby during birth. What increases the risk? Having contact with needles or syringes that have the germ on them. This may happen when you: Inject drugs. Get a tattoo or body piercing. Get a treatment that puts thin needles in your skin (acupuncture). Having sex with someone who has the germ and not using a condom. Living with or having close contact with a person who has the condition. Working in a job where you have contact with blood or body fluids. Traveling to a country where this condition is common. Getting treatment to clean your blood (kidney dialysis). Having been given donated blood (blood transfusion) or an organ transplant. What are the signs or symptoms? Not feeling hungry. A fever. Feeling very tired. Feeling like you may vomit or you vomit. Belly pain. Dark yellow pee (urine). Your skin or the white parts of your eyes look yellow (jaundice). Poop (stool) that is a light color. Joint pain. How is this treated? Treatment may include antiviral medicine. This may help: Lower your risk of serious liver problems. Lower the risk you will spread the germ to others. You will need to avoid taking certain medicines that can lead to liver damage. Follow these instructions at home: Medicines Take over-the-counter and prescription medicines only as told by  your doctor. If you were prescribed an antiviral medicine, take it as told by your doctor. Do not stop taking it even if you start to feel better. Do not take: Over-the-counter medicines that have acetaminophen in them. New medicines unless your doctor says that this is okay. This includes over-the-counter medicines or supplements. Activity Rest as needed. Do not have sex until your doctor says this is okay. Return to your normal activities as told by your doctor. Ask your doctor what activities are safe for you. Ask your doctor when you may return to school or work. Avoid swimming and using hot tubs until your doctor says this is okay. Eating and drinking     Eat a healthy diet. Eat plenty of: Fruits and vegetables. Whole grains. Low-fat (lean) meats or non-meat proteins, such as beans or tofu. Drink enough fluids to keep your pee pale yellow. Do not drink alcohol. General instructions Do not share any of these: Toothbrushes. Nail clippers. Razors. Wash your hands often with soap and water for at least 20 seconds. If you cannot use soap and water, use hand sanitizer. Tell your doctor about people you live with or have close contact with. They may need the hepatitis B vaccine. Cover any cuts or open sores on your skin. Follow instructions about how to not spread the germ. Keep all follow-up visits. How is this prevented? Get the hepatitis B vaccine. If you were exposed to the germ, your doctor may want you to get one of these: Human immunoglobulin. This fights germs in the body.  The hepatitis B vaccine. Even if you got this vaccine as a child, a booster shot may help. Wash your hands often with soap and water for at least 20 seconds. Use a condom every time you have vaginal, oral, or anal sex. Do not share needles or syringes. Do not handle blood or body fluids without gloves or other protection. Avoid getting tattoos or piercings in shops that are not clean. Where to find  more information Centers for Disease Control and Prevention: SaveSearches.co.nz World Health Organization: https://castaneda-walker.com/ Contact a doctor if: You get a rash. Your skin or the whites of your eyes turn yellow or turn more yellow than they were before. You have a fever or chills. Get help right away if: You are not able to eat or drink. You feel confused. You have trouble breathing. You have swelling in any of these areas: Skin. Throat. Mouth. Face. Belly. You have a seizure. You get very sleepy, or you have trouble waking up. These symptoms may be an emergency. Get help right away. Call your local emergency services (911 in the [place] Do not wait to see if the symptoms will go away. Do not drive yourself to the hospital. Summary Hepatitis B is a liver infection that is caused by a germ (virus). The germ can spread from person to person. Do not take any new medicines unless your doctor says that this is okay. Wash your hands often with soap and water for at least 20 seconds. This helps to prevent infection. This information is not intended to replace advice given to you by your health care provider. Make sure you discuss any questions you have with your health care provider. Document Revised: 09/18/2020 Document Reviewed: 09/18/2020 Elsevier Patient Education  2024 [employer].

## 2023-08-03 NOTE — Progress Notes (Signed)
[facility]  Sheila [name], is a 52 y.o. female  YNW:[id number]  MVH:[id number]  DOB - 06/17/1971  Chief Complaint  Patient presents with   abnormal lab values       Subjective:   Sheila [name] is a 52 y.o. female here today for a follow up visit. Patient has No headache, No chest pain, No abdominal pain - No Nausea, No new weakness tingling or numbness, No Cough - shortness of breath  No problems updated.  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 180 tablet 3   No current facility-administered medications on file prior to visit.    Objective:   Vitals:   08/03/23 1508  BP: 112/79  Pulse: 87  Resp: 16  SpO2: 98%  Weight: 204 lb 3.2 oz (92.6 kg)  Height: 5\' 9"  (1.753 m)    Comprehensive ROS Pertinent positive and negative noted in HPI   Exam General appearance : Awake, alert, not in any distress. Speech Clear. Not toxic looking HEENT: Atraumatic and Normocephalic, pupils equally reactive to light and accomodation Neck: Supple, no JVD. No cervical lymphadenopathy.  Chest: Good air entry bilaterally, no added sounds  CVS: S1 S2 regular, no murmurs.  Abdomen: Bowel sounds present, Non tender and not distended with no gaurding, rigidity or rebound. Extremities: B/L Lower Ext shows no  edema, both legs are warm to touch Neurology: Awake alert, and oriented X 3, CN II-XII intact, Non focal Skin: No Rash  Data Review Lab Results  Component Value Date   HGBA1C 5.3 06/30/2020    Assessment & Plan   Sheila [name] was seen today for abnormal lab values.  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 Other issues discussed during this visit include: low cholesterol diet, weight control and daily exercise, foot care, annual eye examinations at Ophthalmology, importance of adherence with medications and regular follow-up. We also discussed long term complications of uncontrolled diabetes and hypertension.   Return in about 6 months (around 01/31/2024).  The patient was given clear instructions to go to ER or return to medical center if symptoms don't improve, worsen or new problems develop. The patient verbalized understanding. The patient was told to call to get lab results if they haven't heard anything in the next week.   This note has been  created with Education officer, environmental. Any transcriptional errors are unintentional.   [name], NP 08/08/2023, 2:34 PM

## 2023-08-04 ENCOUNTER — Telehealth: Payer: Self-pay | Admitting: Pharmacy Technician

## 2023-08-04 ENCOUNTER — Other Ambulatory Visit (HOSPITAL_COMMUNITY): Payer: Self-pay

## 2023-08-04 NOTE — Telephone Encounter (Signed)
[facility] Patient Advocate Encounter  Insurance verification completed.    The patient is uninsured and will need patient assistance for medication.  We can complete the application and will need to meet with the patient for signatures and income documentation.

## 2023-08-08 ENCOUNTER — Encounter (INDEPENDENT_AMBULATORY_CARE_PROVIDER_SITE_OTHER): Payer: Self-pay | Admitting: Primary Care

## 2023-08-09 ENCOUNTER — Encounter: Payer: Self-pay | Admitting: Internal Medicine

## 2023-08-09 ENCOUNTER — Other Ambulatory Visit: Payer: Self-pay

## 2023-08-09 ENCOUNTER — Other Ambulatory Visit (HOSPITAL_COMMUNITY): Payer: Self-pay

## 2023-08-09 ENCOUNTER — Ambulatory Visit (INDEPENDENT_AMBULATORY_CARE_PROVIDER_SITE_OTHER): Payer: Medicaid Other | Admitting: Internal Medicine

## 2023-08-09 VITALS — BP 129/86 | HR 75 | Resp 16 | Ht 69.0 in | Wt 206.0 lb

## 2023-08-09 DIAGNOSIS — Z789 Other specified health status: Secondary | ICD-10-CM

## 2023-08-09 DIAGNOSIS — Z205 Contact with and (suspected) exposure to viral hepatitis: Secondary | ICD-10-CM | POA: Diagnosis not present

## 2023-08-09 NOTE — Progress Notes (Signed)
[facility]      Reason for Consult:hepatitis B core Ab positive    Referring Physician: Westley Hummer, FNP    Patient ID: Sheila [name], female    DOB: 12-07-1970, 52 y.o.   MRN: [id number]  HPI:   Sway is here for evaluation of recent hepatitis B labs.  She has a history of breast cancer s/p lumpectomy and radiation and recently found out her husband is hepatitis B surface Ag positive.  She was tested and is surface Ag negative, Ab positive.  She is hepatitis C negative.  She is unsure if she was ever vaccinated for hepatitis B.    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 Authorizing Provider  metoprolol tartrate (LOPRESSOR) 25 MG tablet Take 1 tablet (25 mg total) by mouth 2 (two) times daily. 07/25/23 10/23/23 Yes Sheila [name], Sheila Pound, NP    Allergies  Allergen Reactions   Anesthetics, Amide Other (See Comments)    seizures   Other Other (See Comments)    General Anesthesia caused seizures-Propofol    Social History   Tobacco Use   Smoking status: Never   Smokeless tobacco: Never  Vaping Use   Vaping status: Never Used  Substance Use Topics   Alcohol use: No   Drug use: No    Family History  Problem Relation Age of Onset   Heart disease Mother    Hypertension Mother    Heart Problems Mother    Hyperlipidemia Mother    Breast cancer Mother 59   Diabetes Father    Seizures Father    Heart disease Sister    Crohn's disease Sister    Crohn's disease Brother    Hypertension Maternal Aunt    Colon cancer Maternal Uncle    Hypertension Maternal  Uncle    Prostate cancer Maternal Uncle    Prostate cancer Maternal Uncle    Cancer Paternal Aunt        unknown type   Cancer Paternal Uncle        unknown type   Cancer Paternal Uncle        unknown type   Throat cancer Paternal Uncle    Hypertension Maternal Grandmother    Stroke Maternal Grandmother    Breast cancer Maternal Grandmother 60   Stroke Maternal Grandfather    Pancreatic cancer Cousin    Breast cancer Cousin        dx 64s, bilateral mastectomies (maternal first cousin)   Bone cancer Nephew 11       knee, great-nephew   Breast cancer Other        great-aunt (MGF's sister)   Breast cancer Other        first cousin once removed (MGF's niece)   Breast cancer Other        first cousin once removed (MGF's niece)   Cancer Other        unknown type, great-uncle (MGM's brother)   Breast cancer Other        first cousin once  removed (MGM's niece)   Breast cancer Other        first cousin once removed (MGM's niece)   Prostate cancer Other        first cousin once removed (MGM's nephew)   Breast cancer Other        second cousin (MGM's great-niece)   Cancer Other        unknown type, dx 43s (maternal aunt's granddaughter)   Sickle cell anemia Other    Esophageal cancer Neg Hx    Stomach cancer Neg Hx    Rectal cancer Neg Hx      Review of Systems  Gastrointestinal: negative for abdominal pain All other systems reviewed and are negative    Constitutional: in no apparent distress  Vitals:   08/09/23 0853  Resp: 16   EYES: anicteric  Labs: Lab Results  Component Value Date   WBC 4.7 10/06/2022   HGB 12.9 10/06/2022   HCT 38.0 10/06/2022   MCV 87.4 10/06/2022   PLT 304 10/06/2022    Lab Results  Component Value Date   CREATININE 0.94 10/06/2022   BUN 10 10/06/2022   NA 139 10/06/2022   K 3.8 10/06/2022   CL 107 10/06/2022   CO2 23 10/06/2022    Lab Results  Component Value Date   ALT 18 05/07/2022   AST 19 05/07/2022   ALKPHOS 60 05/07/2022    BILITOT 0.9 05/07/2022   INR 1.0 05/20/2021     Assessment: hepatitis B immune with recent exposure.  I discussed immunity from hepatitis B and protection from exposures.  No risk for her.   Follow up as needed.   Plan: no follow up testing indicated

## 2023-08-24 ENCOUNTER — Telehealth: Payer: Self-pay | Admitting: Internal Medicine

## 2023-08-24 NOTE — Telephone Encounter (Signed)
Spoke with [employer] they are going to refax order for Cpap supplies.

## 2023-08-24 NOTE — Telephone Encounter (Signed)
[place] with [employer] is following up regarding a request for a certificate of medical necessity that was sent to the office on 10/02. They would like to confirm whether it has been received. Please advise.

## 2023-09-07 NOTE — Telephone Encounter (Signed)
[employer] [facility] is calling because they did not receive the fax (see phone encounter 10/09). [employer] [facility] is requesting for the certificate of medical necessity. [employer] gave the fax number of [phone] and call back number of [phone]. Please advise.

## 2023-09-09 NOTE — Telephone Encounter (Signed)
Reached out to patient to offer assistance with getting her cpap supplies but she has lost her [employer] and she can not afford to purchase her supplies.I encouraged her to call our [place] office and speak to [facility] about a mask and she will work on another way to get the other supplies she needs.

## 2023-09-29 ENCOUNTER — Ambulatory Visit: Payer: Medicaid Other | Admitting: Internal Medicine

## 2023-10-24 ENCOUNTER — Ambulatory Visit: Payer: Medicaid Other | Admitting: Internal Medicine

## 2023-10-24 ENCOUNTER — Ambulatory Visit: Payer: Medicaid Other | Admitting: Nurse Practitioner

## 2023-10-30 ENCOUNTER — Encounter: Payer: Self-pay | Admitting: Internal Medicine

## 2023-11-04 NOTE — Telephone Encounter (Signed)
Patient identification verified by 2 forms. Sheila Rail, RN    Called and spoke to patient  Patient states:   -when walking develops SOB and chest pain   -occurred last night, chest became tight and felt like passing out   -symptoms on going for a while    -discussed symptoms at 9/9 appointment   -symptoms have worsened since 9/9 appointment   -feels like she need oxygen for daily activities   -resting and not moving quickly helps   -has significantly decreased daily activities   -takes Metoprolol 25mg  twice a day  Patient denies at time of call:   -Chest pain   -SOB  Informed patient:   -preemptively scheduled OV 12/26 at 10:30am   -message sent to Dr. Jacques Navy for input/advisement  Reviewed ED warning signs/precautions  Patient verbalized understanding, no questions at this time

## 2023-11-04 NOTE — Telephone Encounter (Signed)
Parke Poisson, MD to Me  Morrell Riddle, RN     11/04/23  3:58 PM  We can write a temporary doctors note for out of work until she is seen for symptoms of chest pain and dyspnea. Unfortunately, I do not complete disability paperwork so if that is the concern it will need to go through primary doctor. Agree with ED precautions. GA

## 2023-11-10 ENCOUNTER — Telehealth (INDEPENDENT_AMBULATORY_CARE_PROVIDER_SITE_OTHER): Payer: Self-pay | Admitting: Primary Care

## 2023-11-10 ENCOUNTER — Ambulatory Visit: Payer: Medicaid Other | Attending: Cardiology | Admitting: Cardiology

## 2023-11-10 ENCOUNTER — Encounter: Payer: Self-pay | Admitting: Cardiology

## 2023-11-10 ENCOUNTER — Encounter (INDEPENDENT_AMBULATORY_CARE_PROVIDER_SITE_OTHER): Payer: Self-pay | Admitting: Primary Care

## 2023-11-10 VITALS — BP 120/80 | HR 78 | Ht 69.0 in | Wt 208.0 lb

## 2023-11-10 DIAGNOSIS — R079 Chest pain, unspecified: Secondary | ICD-10-CM

## 2023-11-10 DIAGNOSIS — R0602 Shortness of breath: Secondary | ICD-10-CM

## 2023-11-10 DIAGNOSIS — R002 Palpitations: Secondary | ICD-10-CM | POA: Diagnosis not present

## 2023-11-10 MED ORDER — METOPROLOL TARTRATE 50 MG PO TABS: 50.0000 mg | ORAL_TABLET | Freq: Two times a day (BID) | ORAL | 3 refills | Status: AC

## 2023-11-10 NOTE — Patient Instructions (Addendum)
Medication Instructions:   INCREASE METOPROLOL TO 50 MG TWICE DAILY= 2 OF THE 25 MG TABLETS TWICE DAILY  *If you need a refill on your cardiac medications before your next appointment, please call your pharmacy*   Follow-Up: At [facility], you and your health needs are our priority.  As part of our continuing mission to provide you with exceptional heart care, we have created designated Provider Care Teams.  These Care Teams include your primary Cardiologist (physician) and Advanced Practice Providers (APPs -  Physician Assistants and Nurse Practitioners) who all work together to provide you with the care you need, when you need it.  We recommend signing up for the patient portal called "MyChart".  Sign up information is provided on this After Visit Summary.  MyChart is used to connect with patients for Virtual Visits (Telemedicine).  Patients are able to view lab/test results, encounter notes, upcoming appointments, etc.  Non-urgent messages can be sent to your provider as well.   To learn more about what you can do with MyChart, go to [email or web address].    Your next appointment:   AS SCHEDULED

## 2023-11-10 NOTE — Telephone Encounter (Signed)
Returned pt call. Pt states she has a form for provider to fill out for social security in regards to her disability. Pt requesting an appt before January but there is no appointments. Pt has been scheduled for Jan 3

## 2023-11-10 NOTE — Telephone Encounter (Signed)
Pt is calling in requesting to speak with [name] regarding paperwork that can only be filled out by her PCP. Pt would like for [name] to give her a call as soon as possible. Please follow up with pt.

## 2023-11-10 NOTE — Progress Notes (Signed)
HPI: Follow-up chest pain.  Patient of Dr. Lupe Carney.  Cardiac catheterization 2015 showed normal coronary arteries.  Coronary CTA July 2021 showed calcium score 0, minimal plaque in the right coronary artery but no obstructive coronary disease.  Monitor January 2024 showed sinus rhythm with 7 beats of SVT, rare PACs, rare PVCs and rare couplet.  Echocardiogram January 2024 showed normal LV function, no significant valvular disease.  Since last seen patient complains of of dyspnea on exertion.  She continues to have problems with palpitations described as her heart skipping.  She has chest tightness intermittently which is also chronic.  She was added to my schedule today.  She complains of dizziness.  No frank syncope.  Current Outpatient Medications  Medication Sig Dispense Refill   metoprolol tartrate (LOPRESSOR) 25 MG tablet Take 1 tablet (25 mg total) by mouth 2 (two) times daily. 180 tablet 3   No current facility-administered medications for this visit.     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    Past Surgical History:  Procedure Laterality Date   AXILLARY LYMPH NODE BIOPSY Left 04/29/2021   Procedure: AXILLARY LYMPH NODE BIOPSY;  Surgeon: Emelia Loron, MD;  Location: [facility] OR;  Service: General;  Laterality: Left;   BREAST LUMPECTOMY WITH RADIOACTIVE SEED AND SENTINEL LYMPH NODE BIOPSY Left 04/29/2021   Procedure: LEFT BREAST LUMPECTOMY WITH RADIOACTIVE SEED;  Surgeon: Emelia Loron, MD;  Location: [facility] OR;  Service: General;  Laterality: Left;   CARDIAC CATHETERIZATION     LEFT HEART CATHETERIZATION WITH CORONARY ANGIOGRAM N/A 05/30/2014   Procedure: LEFT HEART  CATHETERIZATION WITH CORONARY ANGIOGRAM;  Surgeon: Peter M Swaziland, MD;  Location: [facility] CATH LAB;  Service: Cardiovascular;  Laterality: N/A;   TUBAL LIGATION      Social History   Socioeconomic History   Marital status: Married    Spouse name: Not on file   Number of children: 4   Years of education: Not on file   Highest education level: Associate degree: occupational, Scientist, product/process development, or vocational program  Occupational History   Occupation: Health and safety inspector: [employer]  Tobacco Use   Smoking status: Never   Smokeless tobacco: Never  Vaping Use   Vaping status: Never Used  Substance and Sexual Activity   Alcohol use: No   Drug use: No   Sexual activity: Yes    Birth control/protection: Surgical  Other Topics Concern   Not on file  Social History Narrative   Not on file   Social Drivers of Health   Financial Resource Strain: Low Risk  (07/25/2023)   Overall Financial Resource Strain (CARDIA)    Difficulty of Paying Living Expenses: Not hard at all  Food Insecurity: No Food Insecurity (07/25/2023)   Hunger Vital Sign    Worried About Running Out of Food in the Last Year: Never true    Ran Out of Food in the Last Year: Never true  Transportation Needs: No Transportation Needs (07/25/2023)   PRAPARE - Administrator, Civil Service (Medical): No    Lack of Transportation (Non-Medical): No  Physical  Activity: Unknown (07/25/2023)   Exercise Vital Sign    Days of Exercise per Week: 0 days    Minutes of Exercise per Session: Not on file  Stress: No Stress Concern Present (07/25/2023)   [employer] of Occupational Health - Occupational Stress Questionnaire    Feeling of Stress : Not at all  Social Connections: Socially Integrated (07/25/2023)   Social Connection and Isolation Panel [NHANES]    Frequency of Communication with Friends and Family: More than three times a week    Frequency of Social Gatherings with Friends and Family: Once a week    Attends  Religious Services: More than 4 times per year    Active Member of [employer] or Organizations: Yes    Attends Engineer, structural: More than 4 times per year    Marital Status: Married  Catering manager Violence: Not on file    Family History  Problem Relation Age of Onset   Heart disease Mother    Hypertension Mother    Heart Problems Mother    Hyperlipidemia Mother    Breast cancer Mother 33   Diabetes Father    Seizures Father    Heart disease Sister    Crohn's disease Sister    Crohn's disease Brother    Hypertension Maternal Aunt    Colon cancer Maternal Uncle    Hypertension Maternal Uncle    Prostate cancer Maternal Uncle    Prostate cancer Maternal Uncle    Cancer Paternal Aunt        unknown type   Cancer Paternal Uncle        unknown type   Cancer Paternal Uncle        unknown type   Throat cancer Paternal Uncle    Hypertension Maternal Grandmother    Stroke Maternal Grandmother    Breast cancer Maternal Grandmother 35   Stroke Maternal Grandfather    Pancreatic cancer Cousin    Breast cancer Cousin        dx 72s, bilateral mastectomies (maternal first cousin)   Bone cancer Nephew 11       knee, great-nephew   Breast cancer Other        great-aunt (MGF's sister)   Breast cancer Other        first cousin once removed (MGF's niece)   Breast cancer Other        first cousin once removed (MGF's niece)   Cancer Other        unknown type, great-uncle (MGM's brother)   Breast cancer Other        first cousin once removed (MGM's niece)   Breast cancer Other        first cousin once removed (MGM's niece)   Prostate cancer Other        first cousin once removed (MGM's nephew)   Breast cancer Other        second cousin (MGM's great-niece)   Cancer Other        unknown type, dx 56s (maternal aunt's granddaughter)   Sickle cell anemia Other    Esophageal cancer Neg Hx    Stomach cancer Neg Hx    Rectal cancer Neg Hx     ROS: no fevers or chills,  productive cough, hemoptysis, dysphasia, odynophagia, melena, hematochezia, dysuria, hematuria, rash, seizure activity, orthopnea, PND, pedal edema, claudication. Remaining systems are negative.  Physical Exam: Well-developed well-nourished in no acute distress.  Skin is warm and dry.  HEENT is normal.  Neck is supple.  Chest is clear to auscultation with normal expansion.  Cardiovascular exam is regular rate and rhythm.  Abdominal exam nontender or distended. No masses palpated. Extremities show no edema. neuro grossly intact  EKG Interpretation Date/Time:  Thursday November 10 2023 10:42:47 EST Ventricular Rate:  76 PR Interval:  128 QRS Duration:  82 QT Interval:  380 QTC Calculation: 427 R Axis:   3  Text Interpretation: Normal sinus rhythm T wave abnormality, consider inferior ischemia T wave abnormality, consider anterolateral ischemia Twave changes are present on prior ECG Confirmed by Olga Millers ([id number]) on 11/10/2023 10:46:14 AM    A/P  1 chest pain-symptoms are very atypical.  Electrocardiogram shows inferolateral T wave inversion which is unchanged compared paired to previous.  Previous echocardiogram showed normal LV function.  Last CTA showed no obstructive coronary disease.  Will not pursue further ischemia evaluation at this point.  2 palpitations-patient continues to have palpitations.  Will increase metoprolol to 50 mg twice daily.  We discussed the potential for smart watch recording any strips associated with her symptoms.  3 obstructive sleep apnea-continue CPAP.  4 dyspnea-she has had chronic dyspnea on exertion.  Previous echocardiogram showed normal LV function and she is not volume overloaded on examination.  Patient wants assessment for disability and left forms for Dr. Jacques Navy today.  Olga Millers, MD

## 2023-11-14 ENCOUNTER — Other Ambulatory Visit: Payer: Self-pay | Admitting: *Deleted

## 2023-11-14 ENCOUNTER — Encounter: Payer: Self-pay | Admitting: Obstetrics and Gynecology

## 2023-11-14 MED ORDER — FLUCONAZOLE 150 MG PO TABS: 150.0000 mg | ORAL_TABLET | Freq: Once | ORAL | 0 refills | Status: AC

## 2023-11-14 NOTE — Progress Notes (Signed)
Diflucan sent for yeast symptoms.

## 2023-11-17 ENCOUNTER — Telehealth (INDEPENDENT_AMBULATORY_CARE_PROVIDER_SITE_OTHER): Payer: Self-pay | Admitting: Primary Care

## 2023-11-17 NOTE — Telephone Encounter (Signed)
 Called to remind pt about apt. Pt will be present.

## 2023-11-18 ENCOUNTER — Ambulatory Visit (INDEPENDENT_AMBULATORY_CARE_PROVIDER_SITE_OTHER): Payer: Medicaid Other | Admitting: Primary Care

## 2023-11-18 ENCOUNTER — Encounter (INDEPENDENT_AMBULATORY_CARE_PROVIDER_SITE_OTHER): Payer: Self-pay | Admitting: Primary Care

## 2023-11-18 VITALS — BP 131/89 | HR 74 | Resp 16 | Ht 69.0 in | Wt 205.8 lb

## 2023-11-18 DIAGNOSIS — G44229 Chronic tension-type headache, not intractable: Secondary | ICD-10-CM

## 2023-11-18 DIAGNOSIS — Z7689 Persons encountering health services in other specified circumstances: Secondary | ICD-10-CM

## 2023-11-18 DIAGNOSIS — Z23 Encounter for immunization: Secondary | ICD-10-CM

## 2023-11-18 DIAGNOSIS — R569 Unspecified convulsions: Secondary | ICD-10-CM | POA: Diagnosis not present

## 2023-11-18 DIAGNOSIS — Z2821 Immunization not carried out because of patient refusal: Secondary | ICD-10-CM

## 2023-11-18 DIAGNOSIS — Z5912 Inadequate housing utilities: Secondary | ICD-10-CM | POA: Diagnosis not present

## 2023-11-18 NOTE — Patient Instructions (Signed)
 Td (Tetanus, Diphtheria) Vaccine: What You Need to Know Many vaccine information statements are available in Spanish and other languages. See PromoAge.com.br. 1. Why get vaccinated? Td vaccine can prevent tetanus and diphtheria. Tetanus enters the body through cuts or wounds. Diphtheria spreads from person to person. TETANUS (T) causes painful stiffening of the muscles. Tetanus can lead to serious health problems, including being unable to open the mouth, having trouble swallowing and breathing, or death. DIPHTHERIA (D) can lead to difficulty breathing, heart failure, paralysis, or death. 2. Td vaccine Td is only for children 7 years and older, adolescents, and adults.  Td is usually given as a booster dose every 10 years, or after 5 years in the case of a severe or dirty wound or burn. Another vaccine, called "Tdap," may be used instead of Td. Tdap protects against pertussis, also known as "whooping cough," in addition to tetanus and diphtheria. Td may be given at the same time as other vaccines. 3. Talk with your health care provider Tell your vaccination provider if the person getting the vaccine: Has had an allergic reaction after a previous dose of any vaccine that protects against tetanus or diphtheria, or has any severe, life-threatening allergies Has ever had Guillain-Barr Syndrome (also called "GBS") Has had severe pain or swelling after a previous dose of any vaccine that protects against tetanus or diphtheria In some cases, your health care provider may decide to postpone Td vaccination until a future visit. People with minor illnesses, such as a cold, may be vaccinated. People who are moderately or severely ill should usually wait until they recover before getting Td vaccine.  Your health care provider can give you more information. 4. Risks of a vaccine reaction Pain, redness, or swelling where the shot was given, mild fever, headache, feeling tired, and nausea, vomiting,  diarrhea, or stomachache sometimes happen after Td vaccination. People sometimes faint after medical procedures, including vaccination. Tell your provider if you feel dizzy or have vision changes or ringing in the ears.  As with any medicine, there is a very remote chance of a vaccine causing a severe allergic reaction, other serious injury, or death. 5. What if there is a serious problem? An allergic reaction could occur after the vaccinated person leaves the clinic. If you see signs of a severe allergic reaction (hives, swelling of the face and throat, difficulty breathing, a fast heartbeat, dizziness, or weakness), call 9-1-1 and get the person to the nearest hospital.  For other signs that concern you, call your health care provider.  Adverse reactions should be reported to the Vaccine Adverse Event Reporting System (VAERS). Your health care provider will usually file this report, or you can do it yourself. Visit the VAERS website at www.vaers.LAgents.no or call (726)647-7322. VAERS is only for reporting reactions, and VAERS staff members do not give medical advice. 6. The National Vaccine Injury Compensation Program The [employer] Vaccine Injury Compensation Program (VICP) is a federal program that was created to compensate people who may have been injured by certain vaccines. Claims regarding alleged injury or death due to vaccination have a time limit for filing, which may be as short as two years. Visit the VICP website at SpiritualWord.at or call (272)397-5162 to learn about the program and about filing a claim. 7. How can I learn more? Ask your health care provider. Call your local or state health department. Visit the website of the Food and Drug Administration (FDA) for vaccine package inserts and additional information at FinderList.no. Contact  the Centers for Disease Control and Prevention (CDC): Call 4437483466 (1-800-CDC-INFO) or Visit  CDC's website at PicCapture.uy. Source: CDC Vaccine Information Statement Td (Tetanus, Diphtheria) Vaccine (06/20/2020) This same material is available at FootballExhibition.com.br for no charge. This information is not intended to replace advice given to you by your health care provider. Make sure you discuss any questions you have with your health care provider. Document Revised: 02/16/2023 Document Reviewed: 12/17/2022 Elsevier Patient Education  2024 [employer].

## 2023-11-26 NOTE — Progress Notes (Signed)
 [facility]  Sheila [name], is a 53 y.o. female  RDW:[id number]  FMW:[id number]  DOB - 1971/10/02  Chief Complaint  Patient presents with   Shortness of Breath    Started a month ago   Headache    Started a couple of weeks ago        Subjective:   Sheila [name] is a 53 y.o. female here today for a follow up visit. Patient has No headache, No chest pain, No abdominal pain - No Nausea, No new weakness tingling or numbness, No Cough - shortness of breath Shortness of Breath This is a recurrent problem. The current episode started 1 to 4 weeks ago. The problem occurs daily. The problem has been gradually worsening. Associated symptoms include headaches and wheezing. The symptoms are aggravated by any activity and lying flat. She has tried ipratropium inhalers for the symptoms. The treatment provided moderate relief.  Headache    No problems updated.  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 180 tablet 3   No current facility-administered medications on file prior to visit.   Health Maintenance  Topic Date Due   COVID-19 Vaccine (1) Never done   Pneumococcal Vaccination (1 of 2 - PCV) Never done   Flu Shot  02/13/2024*   Zoster  (Shingles) Vaccine (1 of 2) 02/16/2024*   Mammogram  06/19/2025   Pap with HPV screening  02/25/2026   Colon Cancer Screening  08/07/2028   DTaP/Tdap/Td vaccine (2 - Td or Tdap) 11/17/2033   Hepatitis C Screening  Completed   HIV Screening  Completed   HPV Vaccine  Aged Out  *Topic was postponed. The date shown is not the original due date.    Objective:   Vitals:   11/18/23 0958  BP: 131/89  Pulse: 74  Resp: 16  SpO2: 94%  Weight: 205 lb 12.8 oz (93.4 kg)  Height: 5' 9 (1.753 m)   BP Readings from Last 3 Encounters:  11/18/23 131/89  11/10/23 120/80  08/09/23 129/86      Physical Exam Vitals reviewed.  Constitutional:      Appearance: She is obese.  HENT:     Head: Normocephalic.     Right Ear: Tympanic membrane, ear canal and external ear normal.     Left Ear: Tympanic membrane, ear canal and external ear normal.     Nose: Nose normal.     Mouth/Throat:     Mouth: Mucous membranes are moist.  Eyes:     Extraocular Movements: Extraocular movements intact.     Pupils: Pupils are equal, round, and reactive to light.  Cardiovascular:     Rate and Rhythm: Normal rate.  Pulmonary:  Effort: Pulmonary effort is normal.     Comments: Scattered wheezes Abdominal:     General: Bowel sounds are normal.     Palpations: Abdomen is soft.  Musculoskeletal:        General: Normal range of motion.     Cervical back: Normal range of motion.  Skin:    General: Skin is warm and dry.  Neurological:     Mental Status: She is alert and oriented to person, place, and time.  Psychiatric:        Mood and Affect: Mood normal.        Behavior: Behavior normal.        Thought Content: Thought content normal.      Assessment & Plan  Chassie was seen today for shortness of breath and headache.  Diagnoses and all orders for this visit:  Encounter to establish care  Herpes zoster vaccination declined  Influenza vaccination declined  Inadequate housing utilities -      AMB Referral VBCI Care Management  Seizure-like activity [facility]) -     Ambulatory referral to Pulmonology -     Ambulatory referral to Neurology -     Cancel: CBC with Differential -     Cancel: CMP14+EGFR -     CBC with Differential; Future -     CMP14+EGFR; Future  Chronic tension-type headache, not intractable -     Ambulatory referral to Neurology  Encounter for immunization -     Tdap vaccine greater than or equal to 7yo IM       Patient have been counseled extensively about nutrition and exercise. Other issues discussed during this visit include: low cholesterol diet, weight control and daily exercise, foot care, annual eye examinations at Ophthalmology, importance of adherence with medications and regular follow-up. We also discussed long term complications of uncontrolled diabetes and hypertension.   Return in about 3 months (around 02/16/2024).  The patient was given clear instructions to go to ER or return to medical center if symptoms don't improve, worsen or new problems develop. The patient verbalized understanding. The patient was told to call to get lab results if they haven't heard anything in the next week.   This note has been created with Education officer, environmental. Any transcriptional errors are unintentional.   Sheila SHAUNNA Bohr, NP 11/26/2023, 10:06 PM

## 2023-11-29 ENCOUNTER — Ambulatory Visit: Payer: Medicaid Other | Admitting: Nurse Practitioner

## 2023-12-01 ENCOUNTER — Other Ambulatory Visit: Payer: Self-pay

## 2023-12-01 NOTE — Patient Outreach (Signed)
Medicaid Managed Care Social Work Note  12/01/2023 Name:  Sheila [name] MRN:  [id number] DOB:  September 10, 1971  Sheila [name] is an 53 y.o. year old female who is a primary patient of [name], NP.  The [facility] Managed Care Coordination team was consulted for assistance with:  Food Insecurity Utility resources  [name] was given information about Medicaid Managed Care Coordination team services today. Sheila [name] Patient agreed to services and verbal consent obtained.  Engaged with patient  for by telephone forinitial visit in response to referral for case management and/or care coordination services.   Patient is participating in a Managed Medicaid Plan:  Yes  Assessments/Interventions:  Review of past medical history, allergies, medications, health status, including review of consultants reports, laboratory and other test data, was performed as part of comprehensive evaluation and provision of chronic care management services.  SDOH: (Social Drivers of Health) assessments and interventions performed: SDOH Interventions    Flowsheet Row Office Visit from 11/18/2023 in [facility] Office Visit from 08/03/2023 in [facility] Social Work from 04[facility]15/2021 in [facility] A Dept Of [place]. [facility]  SDOH Interventions     Food Insecurity Interventions -- -- Intervention Not Indicated  Housing Interventions -- -- Intervention Not Indicated  Transportation Interventions -- -- Intervention Not Indicated  Utilities Interventions AMB Referral -- --  Depression Interventions/Treatment  -- Counseling --     BSW completed a telephone outreach with patient, she states she needs assistance with her utility bill. Patient states she did reach out to DSS and [employer] and they were unable to assist. BSW and patient agreed for resources to be emailed to address on file for utility and food  resources.  Advanced Directives Status:  Not addressed in this encounter.  Care Plan                 Allergies  Allergen Reactions   Anesthetics, Amide Other (See Comments)    seizures   Other Other (See Comments)    General Anesthesia caused seizures-Propofol    Medications Reviewed Today   Medications were not reviewed in this encounter     Patient Active Problem List   Diagnosis Date Noted   Hepatitis B immune 08/09/2023   Seizure-like activity (HCC) 08/09/2021   Postmenopausal bleeding 06/02/2021   Genetic testing 04/21/2021   Family history of breast cancer    Family history of bone cancer    Family history of prostate cancer    Family history of throat cancer    Malignant neoplasm of upper-outer quadrant of left breast in female, 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   Follow up:  Patient agrees to Care Plan and Follow-up.  Plan: The Managed Medicaid care management team will reach out to the patient again over the next 30 days.  Date/time of next scheduled Social Work care management/care  coordination outreach:  01/02/24  Gus Puma, Kenard Gower, MHA [facility]  Managed [facility] Social Worker [phone]

## 2023-12-01 NOTE — Patient Instructions (Signed)
Visit Information  Sheila [name] was given information about Medicaid Managed Care team care coordination services as a part of their [employer] Medicaid benefit. Samule Dry Kierstead verbally consentedto engagement with the [facility] Care team.   If you are experiencing a medical emergency, please call 911 or report to your local emergency department or urgent care.   If you have a non-emergency medical problem during routine business hours, please contact your provider's office and ask to speak with a nurse.   For questions related to your [employer] [name] health plan, please call: [phone]  OR visit the member homepage at: [email or web address]  If you would like to schedule transportation through your [facility] plan, please call the following number at least 2 days in advance of your appointment: [phone]  If you are experiencing a behavioral health crisis, call the [employer] [facility] Crisis Line at [phone]). The line is available 24 hours a day, seven days a week.  If you would like help to quit smoking, call 1-800-QUIT-NOW ((254) 033-9089) OR Espaol: 1-855-Djelo-Ya (8-756-433-2951) o para ms informacin haga clic aqu or Text READY to 884-166 to register via text   Sheila [name] - following are the goals we discussed in your visit today:   Goals Addressed   None       Social Worker will follow up in 30 days.   Gus Puma, Kenard Gower, MHA [facility]  Managed Medicaid Social Worker [phone]   Following is a copy of your plan of care:  There are no care plans that you recently modified to display for this patient.

## 2023-12-13 ENCOUNTER — Ambulatory Visit: Payer: Medicaid Other | Admitting: Internal Medicine

## 2024-01-02 ENCOUNTER — Emergency Department (HOSPITAL_COMMUNITY)
Admission: EM | Admit: 2024-01-02 | Discharge: 2024-01-03 | Disposition: A | Discharge status: Home / Self Care | Payer: Medicaid Other | Attending: Emergency Medicine | Admitting: Emergency Medicine

## 2024-01-02 ENCOUNTER — Emergency Department (HOSPITAL_COMMUNITY): Payer: Medicaid Other

## 2024-01-02 ENCOUNTER — Other Ambulatory Visit: Payer: Self-pay

## 2024-01-02 DIAGNOSIS — R519 Headache, unspecified: Secondary | ICD-10-CM | POA: Insufficient documentation

## 2024-01-02 DIAGNOSIS — G4489 Other headache syndrome: Secondary | ICD-10-CM

## 2024-01-02 DIAGNOSIS — R55 Syncope and collapse: Secondary | ICD-10-CM

## 2024-01-02 LAB — I-STAT CHEM 8, ED
BUN: 8 mg/dL (ref 6–20)
Calcium, Ion: 1.19 mmol/L (ref 1.15–1.40)
Chloride: 108 mmol/L (ref 98–111)
Creatinine, Ser: 0.9 mg/dL (ref 0.44–1.00)
Glucose, Bld: 86 mg/dL (ref 70–99)
HCT: 41 % (ref 36.0–46.0)
Hemoglobin: 13.9 g/dL (ref 12.0–15.0)
Potassium: 3.9 mmol/L (ref 3.5–5.1)
Sodium: 142 mmol/L (ref 135–145)
TCO2: 25 mmol/L (ref 22–32)

## 2024-01-02 LAB — CBC WITH DIFFERENTIAL/PLATELET
Abs Immature Granulocytes: 0.02 10*3/uL (ref 0.00–0.07)
Basophils Absolute: 0 10*3/uL (ref 0.0–0.1)
Basophils Relative: 0 %
Eosinophils Absolute: 0 10*3/uL (ref 0.0–0.5)
Eosinophils Relative: 0 %
HCT: 37 % (ref 36.0–46.0)
Hemoglobin: 12.6 g/dL (ref 12.0–15.0)
Immature Granulocytes: 0 %
Lymphocytes Relative: 26 %
Lymphs Abs: 1.6 10*3/uL (ref 0.7–4.0)
MCH: 29.6 pg (ref 26.0–34.0)
MCHC: 34.1 g/dL (ref 30.0–36.0)
MCV: 86.9 fL (ref 80.0–100.0)
Monocytes Absolute: 0.4 10*3/uL (ref 0.1–1.0)
Monocytes Relative: 6 %
Neutro Abs: 4.1 10*3/uL (ref 1.7–7.7)
Neutrophils Relative %: 68 %
Platelets: 300 10*3/uL (ref 150–400)
RBC: 4.26 MIL/uL (ref 3.87–5.11)
RDW: 13.5 % (ref 11.5–15.5)
WBC: 6.1 10*3/uL (ref 4.0–10.5)
nRBC: 0 % (ref 0.0–0.2)

## 2024-01-02 LAB — RESP PANEL BY RT-PCR (RSV, FLU A&B, COVID)  RVPGX2
Influenza A by PCR: NEGATIVE
Influenza B by PCR: NEGATIVE
Resp Syncytial Virus by PCR: NEGATIVE
SARS Coronavirus 2 by RT PCR: NEGATIVE

## 2024-01-02 LAB — BASIC METABOLIC PANEL
Anion gap: 8 (ref 5–15)
BUN: 7 mg/dL (ref 6–20)
CO2: 23 mmol/L (ref 22–32)
Calcium: 9.3 mg/dL (ref 8.9–10.3)
Chloride: 107 mmol/L (ref 98–111)
Creatinine, Ser: 0.85 mg/dL (ref 0.44–1.00)
GFR, Estimated: >60 mL/min (ref >60)
Glucose, Bld: 93 mg/dL (ref 70–99)
Potassium: 5.1 mmol/L (ref 3.5–5.1)
Sodium: 138 mmol/L (ref 135–145)

## 2024-01-02 LAB — TROPONIN I (HIGH SENSITIVITY): Troponin I (High Sensitivity): 4 ng/L (ref <18)

## 2024-01-02 MED ORDER — PROCHLORPERAZINE EDISYLATE 10 MG/2ML IJ SOLN
10.0000 mg | Freq: Once | INTRAMUSCULAR | Status: AC
  Administered 2024-01-02: 10 mg via INTRAVENOUS
  Filled 2024-01-02: qty 2

## 2024-01-02 MED ORDER — DIPHENHYDRAMINE HCL 50 MG/ML IJ SOLN
12.5000 mg | Freq: Once | INTRAMUSCULAR | Status: AC
  Administered 2024-01-02: 12.5 mg via INTRAVENOUS
  Filled 2024-01-02: qty 1

## 2024-01-02 MED ORDER — SODIUM CHLORIDE 0.9 % IV BOLUS: 1000.0000 mL | Freq: Once | INTRAVENOUS | Status: DC

## 2024-01-02 NOTE — Patient Outreach (Signed)
Medicaid Managed Care Social Work Note  01/02/2024 Name:  Sheila [name] MRN:  [id number] DOB:  28-Oct-1971  Sheila [name] Sheila [name] is an 53 y.o. year old female who is a primary patient of [name], NP.  The Medicaid Managed Care Coordination team was consulted for assistance with:  Community Resources   Ms. Spells was given information about Medicaid Managed Care Coordination team services today. Sheila [name] Manner Patient agreed to services and verbal consent obtained.  Engaged with patient  for by telephone forfollow up visit in response to referral for case management and/or care coordination services.   Patient is participating in a Managed Medicaid Plan:  Yes  Assessments/Interventions:  Review of past medical history, allergies, medications, health status, including review of consultants reports, laboratory and other test data, was performed as part of comprehensive evaluation and provision of chronic care management services.  SDOH: (Social Drivers of Health) assessments and interventions performed: SDOH Interventions    Flowsheet Row Office Visit from 11/18/2023 in [facility] Office Visit from 08/03/2023 in [facility] Social Work from 04/15/2021 in [facility] - A Dept Of [place]  SDOH Interventions     Food Insecurity Interventions -- -- Intervention Not Indicated  Housing Interventions -- -- Intervention Not Indicated  Transportation Interventions -- -- Intervention Not Indicated  Utilities Interventions AMB Referral -- --  Depression Interventions/Treatment  -- Counseling --      BSW completed a telephone outreach with patient, she states she did receive the resources BSW sent but none of them were able to assist. BSW encouraged patient to continue to try them throughout the month. No other resources are needed at this time. BSW provided patient with contact information  for any future needs.  Advanced Directives Status:  Not addressed in this encounter.  Care Plan                 Allergies  Allergen Reactions   Anesthetics, Amide Other (See Comments)    seizures   Other Other (See Comments)    General Anesthesia caused seizures-Propofol    Medications Reviewed Today   Medications were not reviewed in this encounter     Patient Active Problem List   Diagnosis Date Noted   Hepatitis B immune 08/09/2023   Seizure-like activity (HCC) 08/09/2021   Postmenopausal bleeding 06/02/2021   Genetic testing 04/21/2021   Family history of breast cancer    Family history of bone cancer    Family history of prostate cancer    Family history of throat cancer    Malignant neoplasm of upper-outer quadrant of left breast in female, 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   Follow up:  Patient agrees to Care Plan and Follow-up.  Plan: The  Patient has been provided with contact information for the Managed Medicaid care management team and has been advised to call with any health related  questions or concerns.    Abelino Derrick, MHA [facility]  Managed [facility] Social Worker [phone]

## 2024-01-02 NOTE — ED Triage Notes (Signed)
Patient brought in by EMS from home with c/o syncopal episode and she fell and hit her head. Patient c/o headache and pain to right arm. Patient alert and oriented x4.

## 2024-01-02 NOTE — ED Provider Notes (Signed)
[place] EMERGENCY DEPARTMENT AT [facility] Provider Note   CSN: [id number] Arrival date & time: 01/02/24  2126     History  Chief Complaint  Patient presents with   Loss of Consciousness    Sheila [name] is a 53 y.o. female presented to ED with complaint of headache, right-sided burning sensation.  Patient reports that she got up out of the bed this evening around 7 or 8 PM and had a sudden onset of vertigo or balance problems, lightheadedness, headache, burning on the right side.  She reportedly then had syncope per EMS, where she fell and struck her head.  She does not recall this episode.  She denies any prior history of syncope.  She reports currently she has a significant headache.  She says she does not regularly get headaches.  She denies chest pressure reports there is a burning sensation in her right arm.  HPI     Home Medications Prior to Admission medications   Medication Sig Start Date End Date Taking? Authorizing Provider  metoprolol tartrate (LOPRESSOR) 50 MG tablet Take 1 tablet (50 mg total) by mouth 2 (two) times daily. 11/10/23 02/08/24  Lewayne Bunting, MD      Allergies    Anesthetics, amide and Other    Review of Systems   Review of Systems  Physical Exam Updated Vital Signs BP (!) 149/93 (BP Location: Right Arm)   Pulse 76   Temp 98 F (36.7 C) (Oral)   Resp 10   Ht 5\' 9"  (1.753 m)   Wt 90.7 kg   LMP 04/03/2018 (Within Days)   SpO2 100%   BMI 29.53 kg/m  Physical Exam Constitutional:      General: She is not in acute distress. HENT:     Head: Normocephalic and atraumatic.  Eyes:     Conjunctiva/sclera: Conjunctivae normal.     Pupils: Pupils are equal, round, and reactive to light.  Cardiovascular:     Rate and Rhythm: Normal rate and regular rhythm.  Pulmonary:     Effort: Pulmonary effort is normal. No respiratory distress.  Abdominal:     General: There is no distension.     Tenderness: There is no abdominal  tenderness.  Skin:    General: Skin is warm and dry.  Neurological:     General: No focal deficit present.     Mental Status: She is alert and oriented to person, place, and time. Mental status is at baseline.     Sensory: No sensory deficit.     Comments: Patient appears of symmetrical weakness of the upper and lower extremities, poor effort on grip strength testing, able to lift legs off of the bed, no obvious pronator drift.  Voice is clear; no evident cranial nerve deficits  Psychiatric:        Mood and Affect: Mood normal.        Behavior: Behavior normal.     ED Results / Procedures / Treatments   Labs (all labs ordered are listed, but only abnormal results are displayed) Labs Reviewed  RESP PANEL BY RT-PCR (RSV, FLU A&B, COVID)  [id number]  CBC WITH DIFFERENTIAL/PLATELET  BASIC METABOLIC PANEL  I-STAT CHEM 8, ED  TROPONIN I (HIGH SENSITIVITY)    EKG EKG Interpretation Date/Time:  Monday January 02 2024 21:41:10 EST Ventricular Rate:  69 PR Interval:  120 QRS Duration:  65 QT Interval:  507 QTC Calculation: 544 R Axis:   7  Text Interpretation: Sinus rhythm Low  voltage, precordial leads Borderline abnrm T, anterolateral leads Prolonged QT interval Confirmed by Alvester Chou [phone]) on 01/02/2024 10:05:56 PM  Radiology No results found.  Procedures Procedures    Medications Ordered in ED Medications  sodium chloride 0.9 % bolus 1,000 mL (has no administration in time range)  prochlorperazine (COMPAZINE) injection 10 mg (10 mg Intravenous Given 01/02/24 2235)  diphenhydrAMINE (BENADRYL) injection 12.5 mg (12.5 mg Intravenous Given 01/02/24 2235)    ED Course/ Medical Decision Making/ A&P Clinical Course as of 01/02/24 2310  Mon Jan 02, 2024  2307 RN confirms that CT team contacted and aware of need for priority scan.  Initially had come but USIV was being placed, and are reportedly coming back now to take for scan. [MT]    Clinical Course User Index [MT]  Mccall Lomax, Kermit Balo, MD                                 Medical Decision Making Amount and/or Complexity of Data Reviewed Labs: ordered. Radiology: ordered.  Risk Prescription drug management.   This patient presents to the ED with concern for headache, right-sided body pain, near syncope versus syncope. This involves an extensive number of treatment options, and is a complaint that carries with it a high risk of complications and morbidity.  The differential diagnosis includes complex migraine versus SAH versus viral illness versus atypical ACS versus other  Additional history obtained from EMS  External records from outside source obtained and reviewed including hospital discharge summary from September 2022 at which time the patient is noted have history of breast cancer status postlumpectomy radiation therapy, nonobstructive coronary disease, unresponsive episode after anesthesia for colonoscopy and concern for potential generalized seizures at the time - although there was question about psychogenic seizures per discharge report.  EEG in 08/07/21 and 11/17/21 with no seizure activity noted, including overnight EEG on 08/08/21.  I ordered and personally interpreted labs.  The pertinent results include:  WBC wnl.  Remaining labs pending at signout.  I ordered imaging studies including CT scan of the head, which was pending at the time of signout  The patient was maintained on a cardiac monitor.  I personally viewed and interpreted the cardiac monitored which showed an underlying rhythm of: NSR  Per my interpretation the patient's ECG shows sinus rhythm with borderline prolonged QTc  I ordered medication including IV headache medications  I have reviewed the patients home medicines and have made adjustments as needed  Patient is signed out to Dr Bebe Shaggy EDP at 11 pm pending CT imaging and reassessment of headache and symptoms after medications.        Final Clinical Impression(s)  / ED Diagnoses Final diagnoses:  Near syncope    Rx / DC Orders ED Discharge Orders     None         Ajani Rineer, Kermit Balo, MD 01/02/24 2310

## 2024-01-02 NOTE — ED Notes (Signed)
Up walking to bathroom, gait steady 

## 2024-01-03 NOTE — ED Provider Notes (Signed)
I assumed care at signout to follow-up on labs and imaging.  CT head and labs are unremarkable.  Patient feels improved, walking around the ER no distress requesting discharge I will refer her to neurology.  Patient is awake alert and appropriate for discharge   Sheila Rhine, MD 01/03/24 [phone]

## 2024-01-03 NOTE — Discharge Instructions (Signed)

## 2024-01-03 NOTE — ED Notes (Signed)
Patient refused discharge vital signs stating she was ready to go

## 2024-01-05 ENCOUNTER — Ambulatory Visit: Payer: Medicaid Other | Attending: Physician Assistant | Admitting: Cardiology

## 2024-01-05 ENCOUNTER — Encounter: Payer: Self-pay | Admitting: Physician Assistant

## 2024-01-05 VITALS — BP 136/84 | HR 95 | Ht 69.0 in | Wt 204.0 lb

## 2024-01-05 DIAGNOSIS — R002 Palpitations: Secondary | ICD-10-CM

## 2024-01-05 DIAGNOSIS — G4733 Obstructive sleep apnea (adult) (pediatric): Secondary | ICD-10-CM

## 2024-01-05 DIAGNOSIS — R079 Chest pain, unspecified: Secondary | ICD-10-CM | POA: Diagnosis not present

## 2024-01-05 NOTE — Patient Instructions (Signed)
 Medication Instructions:  NO CHANGES *If you need a refill on your cardiac medications before your next appointment, please call your pharmacy*   Lab Work: NO LABS If you have labs (blood work) drawn today and your tests are completely normal, you will receive your results only by: MyChart Message (if you have MyChart) OR A paper copy in the mail If you have any lab test that is abnormal or we need to change your treatment, we will call you to review the results.   Testing/Procedures: NO TESTING   Follow-Up: At [facility], you and your health needs are our priority.  As part of our continuing mission to provide you with exceptional heart care, we have created designated Provider Care Teams.  These Care Teams include your primary Cardiologist (physician) and Advanced Practice Providers (APPs -  Physician Assistants and Nurse Practitioners) who all work together to provide you with the care you need, when you need it.   Your next appointment:   1 year(s)  Provider:   Parke Poisson, MD

## 2024-01-05 NOTE — Progress Notes (Signed)
Cardiology Office Note:  .   Date:  01/05/2024  ID:  Sheila [name], DOB June 17, 1971, MRN [id number] PCP: Sheila Sessions, NP  [name] Providers Cardiologist:  Parke Poisson, MD {  History of Present Illness: .   Sheila [name] is a 53 y.o. female with history of OSA on CPAP.  Has never smoked, drink, done drugs or alcohol.  We have primarily been seeing her for atypical chest pain, chronic shortness of breath, palpitations.  Overall workup has been negative she has had previous heart cath in 2015 showing normal coronary arteries.  July 2021 CAC 0.  Heart monitor January 2024 predominantly sinus with only 7 beats of SVT and rare ventricular ectopy.  Echocardiogram January 2024 normal LVEF no valvular disease.  At her last appointment in December 2024 CAD she was noted to have inferolateral T wave inversions which are chronic.  No plans for pursuing ischemic evaluation.  For her palpitations, metoprolol increased to 50 mg twice daily.  She was also seen recently few days ago for near syncopal event.  Reported headache, right-sided burning sensation, headache, balance issues.  Head CT unremarkable and she was referred to neurology.  In speaking with patient today I think the more accurate depiction of her symptoms is she is having severe daytime somnolence with frequent episodes of falling asleep.  She still notes apneic spells even with her CPAP machine.  She reports having new equipment sent to her but not trying this.  She still has intermittent chest pain which she says she is unable to describe in any meaningful way.  Does not seem exertional though.  Still has palpitations, beta-blocker did not really help.  Otherwise does not have any other significant complaints.  Initially had a EKG on the 17th that showed prolonged QTc but I feel this is an accurate measurement.  EKG not repeated today.   ROS: 1 year  Studies Reviewed: Marland Kitchen       EKG from 01/02/2024 showing  sinus rhythm, heart rate 69.  No new acute ST-T wave changes. Cardiac Studies & Procedures   ______________________________________________________________________________________________     ECHOCARDIOGRAM  ECHOCARDIOGRAM COMPLETE 12/02/2022  Narrative ECHOCARDIOGRAM REPORT    Patient Name:   Sheila [name] Date of Exam: 12/02/2022 Medical Rec #:  [id number]            Height:       69.0 in Accession #:    [id number]           Weight:       209.0 lb Date of Birth:  11/24/1970            BSA:          2.105 m Patient Age:    51 years             BP:           128/70 mmHg Patient Gender: F                    HR:           88 bpm. Exam Location:  [address]  Procedure: 2D Echo, 3D Echo, Cardiac Doppler, Color Doppler and Strain Analysis  Indications:    R06.02 Shortness of breath  History:        Patient has prior history of Echocardiogram examinations, most recent 05/15/2020.  Sonographer:    Sheila [name] RDCS Referring Phys: [id number] Parke Poisson  IMPRESSIONS   1.  Left ventricular ejection fraction, by estimation, is 70 to 75%. Left ventricular ejection fraction by PLAX is 75 %. The left ventricle has hyperdynamic function. The left ventricle has no regional wall motion abnormalities. Left ventricular diastolic parameters were normal. The average left ventricular global longitudinal strain is -24.8 %. The global longitudinal strain is normal. 2. Right ventricular systolic function is normal. The right ventricular size is normal. There is mildly elevated pulmonary artery systolic pressure. The estimated right ventricular systolic pressure is 40.9 mmHg. 3. The mitral valve is grossly normal. Trivial mitral valve regurgitation. 4. The aortic valve is tricuspid. Aortic valve regurgitation is not visualized. 5. The inferior vena cava is normal in size with <50% respiratory variability, suggesting right atrial pressure of 8 mmHg.  Comparison(s): Changes from  prior study are noted. 05/15/2020: LVEF 65-70%, mild to moderate TR, RVSP 21.5 mmHg.  FINDINGS Left Ventricle: Left ventricular ejection fraction, by estimation, is 70 to 75%. Left ventricular ejection fraction by PLAX is 75 %. The left ventricle has hyperdynamic function. The left ventricle has no regional wall motion abnormalities. The average left ventricular global longitudinal strain is -24.8 %. The global longitudinal strain is normal. The left ventricular internal cavity size was normal in size. There is no left ventricular hypertrophy. Left ventricular diastolic parameters were normal.  Right Ventricle: The right ventricular size is normal. No increase in right ventricular wall thickness. Right ventricular systolic function is normal. There is mildly elevated pulmonary artery systolic pressure. The tricuspid regurgitant velocity is 2.87 m/s, and with an assumed right atrial pressure of 8 mmHg, the estimated right ventricular systolic pressure is 40.9 mmHg.  Left Atrium: Left atrial size was normal in size.  Right Atrium: Right atrial size was normal in size.  Pericardium: There is no evidence of pericardial effusion.  Mitral Valve: The mitral valve is grossly normal. Trivial mitral valve regurgitation.  Tricuspid Valve: The tricuspid valve is grossly normal. Tricuspid valve regurgitation is mild.  Aortic Valve: The aortic valve is tricuspid. Aortic valve regurgitation is not visualized.  Pulmonic Valve: The pulmonic valve was normal in structure. Pulmonic valve regurgitation is not visualized.  Aorta: The aortic root and ascending aorta are structurally normal, with no evidence of dilitation.  Venous: The inferior vena cava is normal in size with less than 50% respiratory variability, suggesting right atrial pressure of 8 mmHg.  IAS/Shunts: No atrial level shunt detected by color flow Doppler.   LEFT VENTRICLE PLAX 2D LV EF:         Left            Diastology ventricular     LV  e' medial:    7.78 cm/s ejection        LV E/e' medial:  11.9 fraction by     LV e' lateral:   13.20 cm/s PLAX is 75      LV E/e' lateral: 7.0 %. LVIDd:         4.40 cm         2D LVIDs:         2.50 cm         Longitudinal LV PW:         0.70 cm         Strain LV IVS:        0.70 cm         2D Strain GLS  -25.1 % LVOT diam:     1.80 cm         (  A2C): LV SV:         70              2D Strain GLS  -24.8 % LV SV Index:   33              (A3C): LVOT Area:     2.54 cm        2D Strain GLS  -24.4 % (A4C): 2D Strain GLS  -24.8 % Avg:  3D Volume EF: 3D EF:        55 % LV EDV:       142 ml LV ESV:       64 ml LV SV:        78 ml  RIGHT VENTRICLE             IVC RV Basal diam:  3.50 cm     IVC diam: 1.90 cm RV S prime:     13.47 cm/s TAPSE (M-mode): 2.4 cm  LEFT ATRIUM             Index        RIGHT ATRIUM          Index LA diam:        3.70 cm 1.76 cm/m   RA Area:     8.05 cm LA Vol (A2C):   45.3 ml 21.52 ml/m  RA Volume:   15.30 ml 7.27 ml/m LA Vol (A4C):   33.5 ml 15.91 ml/m LA Biplane Vol: 41.4 ml 19.67 ml/m AORTIC VALVE LVOT Vmax:   145.33 cm/s LVOT Vmean:  99.067 cm/s LVOT VTI:    0.275 m  AORTA Ao Root diam: 3.10 cm Ao Asc diam:  2.80 cm  MITRAL VALVE               TRICUSPID VALVE MV Area (PHT): 3.72 cm    TR Peak grad:   32.9 mmHg MV Decel Time: 204 msec    TR Vmax:        287.00 cm/s MV E velocity: 92.80 cm/s MV A velocity: 85.00 cm/s  SHUNTS MV E/A ratio:  1.09        Systemic VTI:  0.28 m Systemic Diam: 1.80 cm  Sheila Shutter MD Electronically signed by Sheila Shutter MD Signature Date/Time: 12/02/2022/5:36:58 PM    Final    MONITORS  LONG TERM MONITOR (3-14 DAYS) 11/16/2022  Narrative Indication: palpitations  Minimum HR (bpm): 60 Maximum HR (bpm): 174  Supraventricular Ectopy: rare <1% SVT: 1 episode, avg rate 165 bpm, 7 beats, not triggered/no patient diary.  Ventricular Ectopy: rare <1% NSVT: none Ventricular Tachycardia:  none  Pauses: none AV block: none  Atrial fibrillation: none  Diary events: Monitor triggered by patient for SR, ventricular ectopy  Impression Symptoms correlate with infrequent ventricular ectopy.  Patch Wear Time:  3 days and 3 hours (2023-12-20T12:04:00-0500 to 2023-12-23T15:58:19-0500)  Patient had a min HR of 60 bpm, max HR of 174 bpm, and avg HR of 84 bpm. Predominant underlying rhythm was Sinus Rhythm. 1 run of Supraventricular Tachycardia occurred lasting 7 beats with a max rate of 174 bpm (avg 165 bpm). Isolated SVEs were rare (<1.0%), SVE Couplets were rare (<1.0%), and no SVE Triplets were present. Isolated VEs were rare (<1.0%), VE Couplets were rare (<1.0%), and no VE Triplets were present. Ventricular Bigeminy and Trigeminy were present.   CT SCANS  CT CORONARY MORPH W/CTA COR W/SCORE 05/26/2020  Addendum 05/27/2020  9:28 AM ADDENDUM REPORT: 05/27/2020 09:25  HISTORY:  Chest pain, nonspecific  EXAM: Cardiac/Coronary  CT  TECHNIQUE: The patient was scanned on a [employer].  PROTOCOL: A 120 kV prospective scan was triggered in the descending thoracic aorta at 111 HU's. Axial non-contrast 3 mm slices were carried out through the heart. The data set was analyzed on a dedicated work station and scored using the Agatson method. Gantry rotation speed was 250 msecs and collimation was .6 mm. Beta blockade and 0.8 mg of sl NTG was given. The 3D data set was reconstructed in 5% intervals of the 67-82 % of the R-R cycle. Diastolic phases were analyzed on a dedicated work station using MPR, MIP and VRT modes. The patient received 80mL OMNIPAQUE IOHEXOL 350 MG/ML SOLN of contrast.  FINDINGS: Coronary calcium score is 0. Possible mild calcification in the RCA which may be under the detection threshold of the scan.  Coronary arteries: Normal coronary origins.  Right dominance.  Right Coronary Artery: Minimal atherosclerotic plaque in the proximal RCA, <25%  stenosis.  Left Main Coronary Artery: No detectable plaque or stenosis.  Left Anterior Descending Coronary Artery: No detectable plaque or stenosis.  Left Circumflex Artery: No detectable plaque or stenosis.  Aorta: Normal size, 28 mm at the mid ascending aorta (level of the PA bifurcation) measured double oblique. No calcifications. No dissection.  Aortic Valve: No calcifications.  Other findings:  Normal pulmonary vein drainage into the left atrium.  Normal left atrial appendage without a thrombus.  Normal size of the pulmonary artery.  LV wall thickness is upper limit of normal at 9 mm circumferentially.  IMPRESSION: 1. Minimal CAD, CADRADS = 1.  2. Coronary calcium score of 0.  3. Normal coronary origin with right dominance.   Electronically Signed By: Weston Brass On: 05/27/2020 09:25  Narrative EXAM: OVER-READ INTERPRETATION  CT CHEST  The following report is an over-read performed by radiologist Dr. Charlett Nose of [facility] on 05/26/2020. This over-read does not include interpretation of cardiac or coronary anatomy or pathology. The coronary CTA interpretation by the cardiologist is attached.  COMPARISON:  None.  FINDINGS: Vascular: Heart is normal size.  Aorta normal caliber.  Mediastinum/Nodes: No adenopathy.  Lungs/Pleura: No confluent opacities or effusions.  Upper Abdomen: Imaging into the upper abdomen shows no acute findings.  Musculoskeletal: Chest wall soft tissues are unremarkable. No acute bony abnormality.  IMPRESSION: No acute or significant extracardiac abnormality.  Electronically Signed: By: Charlett Nose M.D. On: 05/26/2020 15:19     ______________________________________________________________________________________________      Risk Assessment/Calculations:            Physical Exam:   VS:  BP 136/84 (BP Location: Right Arm, Patient Position: Sitting, Cuff Size: Normal)   Pulse 95   Ht 5\' 9"   (1.753 m)   Wt 204 lb (92.5 kg)   LMP 04/03/2018 (Within Days)   SpO2 97%   BMI 30.13 kg/m    Wt Readings from Last 3 Encounters:  01/05/24 204 lb (92.5 kg)  01/02/24 200 lb (90.7 kg)  11/18/23 205 lb 12.8 oz (93.4 kg)    GEN: Well nourished, well developed in no acute distress NECK: No JVD; No carotid bruits CARDIAC: RRR, no murmurs, rubs, gallops RESPIRATORY:  Clear to auscultation without rales, wheezing or rhonchi  ABDOMEN: Soft, non-tender, non-distended EXTREMITIES:  No edema; No deformity   ASSESSMENT AND PLAN: .    Chest pain Symptoms felt to be very atypical and she has chronic inferolateral T wave inversions unchanged and actually  look improved on EKG on the 17th.  Normal coronaries by catheterization in 2015, 2021 CAC score of 0.  She has had a decent amount of workup all showing negative results and with very minimal risk factors.  Chest pain likely noncardiac and at this time would not workup any further.  Palpitations Previous heart monitorJanuary 2024 did not show significant PVC burden.  We will continue Lopressor 50 mg twice daily.  Suspect treatment of her OSA will also improve this.  OSA on CPAP Significant daytime somnolence with frequent episodes of narcoleptic behavior.  Suspect that this could be the culprit of many of her symptoms or at the very least could be contributing.  Asked patient to touch base with her sleep doctor to ensure CPAP mask is on appropriate settings and fitted properly.   Dispo: 1 year  Signed, Abagail Kitchens, [name]

## 2024-01-09 ENCOUNTER — Telehealth: Payer: Self-pay | Admitting: Neurology

## 2024-01-09 NOTE — Telephone Encounter (Signed)
 Patient got a new referral to [place] from the ED for headaches. Previously seen Dr. Teresa Coombs [phone] states does not wish to return to him would like to see Dr. Terrace Arabia. Please advise if this is appropriate, thank you

## 2024-01-10 NOTE — Telephone Encounter (Signed)
 Patient called back in after being advised of Dr Terrace Arabia declining the provider switch. Sheila [name] is asking if a provider switch request can be sent to Dr Epimenio Foot.   Please advise.

## 2024-01-10 NOTE — Telephone Encounter (Signed)
 History of seizure, prefer her continue care with Dr. Teresa Coombs

## 2024-02-14 ENCOUNTER — Ambulatory Visit (INDEPENDENT_AMBULATORY_CARE_PROVIDER_SITE_OTHER): Payer: Medicaid Other | Admitting: Pulmonary Disease

## 2024-02-14 ENCOUNTER — Encounter: Payer: Self-pay | Admitting: Pulmonary Disease

## 2024-02-14 ENCOUNTER — Ambulatory Visit

## 2024-02-14 VITALS — BP 130/85 | HR 80 | Ht 69.0 in | Wt 207.0 lb

## 2024-02-14 DIAGNOSIS — R0789 Other chest pain: Secondary | ICD-10-CM

## 2024-02-14 DIAGNOSIS — R079 Chest pain, unspecified: Secondary | ICD-10-CM

## 2024-02-14 DIAGNOSIS — J4540 Moderate persistent asthma, uncomplicated: Secondary | ICD-10-CM

## 2024-02-14 DIAGNOSIS — R0609 Other forms of dyspnea: Secondary | ICD-10-CM | POA: Diagnosis not present

## 2024-02-14 MED ORDER — BUDESONIDE-FORMOTEROL FUMARATE 160-4.5 MCG/ACT IN AERO
2.0000 | INHALATION_SPRAY | Freq: Two times a day (BID) | RESPIRATORY_TRACT | 12 refills | Status: AC
Start: 2024-02-14 — End: ?

## 2024-02-14 MED ORDER — ALBUTEROL SULFATE HFA 108 (90 BASE) MCG/ACT IN AERS
2.0000 | INHALATION_SPRAY | Freq: Four times a day (QID) | RESPIRATORY_TRACT | 6 refills | Status: AC | PRN
Start: 2024-02-14 — End: ?

## 2024-02-14 NOTE — Patient Instructions (Addendum)
 It is nice to meet you  I think your symptoms are related to asthma based on your prior chest imaging and your symptoms.  To treat this use Symbicort 2 puffs in the morning and 2 puffs in the evening.  Rinse your mouth out with water after every use.  I was prescribed albuterol, use 2 puffs every 4-6 hours as needed for shortness of breath or wheeze.  Musket a chest x-ray today, I think things are okay but just to make sure for the chest pain  I ordered echocardiogram to evaluate heart changes that can cause.  Shortness of breath  I will order pulmonary function test to see how well your lungs are working see if we can arrive at a more confident diagnosis  Return to clinic in 3 months or sooner as needed with Dr. Judeth Horn after pulmonary function test

## 2024-02-16 ENCOUNTER — Encounter (INDEPENDENT_AMBULATORY_CARE_PROVIDER_SITE_OTHER): Payer: Self-pay | Admitting: Primary Care

## 2024-02-16 ENCOUNTER — Ambulatory Visit (INDEPENDENT_AMBULATORY_CARE_PROVIDER_SITE_OTHER): Payer: Medicaid Other | Admitting: Primary Care

## 2024-02-16 VITALS — BP 126/84 | HR 79 | Temp 97.6°F | Resp 16 | Wt 202.0 lb

## 2024-02-16 DIAGNOSIS — R569 Unspecified convulsions: Secondary | ICD-10-CM

## 2024-02-16 DIAGNOSIS — R519 Headache, unspecified: Secondary | ICD-10-CM | POA: Diagnosis not present

## 2024-02-16 NOTE — Progress Notes (Unsigned)
 [facility]  Sheila [name], is a 53 y.o. female  OZH:[id number]  GEX:[id number]  DOB - 1971-04-15  Chief Complaint  Patient presents with   Referral   knee       Subjective:   Sheila [name] is a 53 y.o. female here today for an acute visit.  Knee Pain  The incident occurred more than 1 week ago. There was no injury mechanism. Pain location: right knee can not bend without hurting. The quality of the pain is described as aching. The pain is at a severity of 8/10. The pain is moderate. The pain has been Constant since onset. Associated symptoms include an inability to bear weight, a loss of motion and muscle weakness. The symptoms are aggravated by movement and weight bearing. She has tried heat and rest for the symptoms. The treatment provided no relief.    No problems updated.  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 8 g 6   budesonide-formoterol (SYMBICORT) 160-4.5 MCG/ACT inhaler Inhale 2 puffs into the lungs 2 (two) times daily. 1 each 12   fluconazole (DIFLUCAN) 150 MG tablet Take 150 mg by mouth once.     metoprolol tartrate (LOPRESSOR) 50 MG tablet Take 1 tablet (50 mg total) by  mouth 2 (two) times daily. 180 tablet 3   No current facility-administered medications on file prior to visit.   Health Maintenance  Topic Date Due   COVID-19 Vaccine (1) Never done   Pneumococcal Vaccination (1 of 2 - PCV) Never done   Zoster (Shingles) Vaccine (1 of 2) 02/16/2024*   Flu Shot  06/15/2024   Mammogram  06/19/2025   Pap with HPV screening  02/25/2026   Colon Cancer Screening  08/07/2028   DTaP/Tdap/Td vaccine (2 - Td or Tdap) 11/17/2033   Hepatitis C Screening  Completed   HIV Screening  Completed   HPV Vaccine  Aged Out  *Topic was postponed. The date shown is not the original due date.    Objective:   Vitals:   02/16/24 1132  BP: 126/84  Pulse: 79  Resp: 16  Temp: 97.6 F (36.4 C)  TempSrc: Oral  SpO2: 98%  Weight: 202 lb (91.6 kg)   {Vitals History (Optional):23777}  Physical Exam  {Labs (Optional):23779}  Assessment & Plan   Seizure-like activity [facility]) -     Ambulatory referral to Neurology  Frequent headaches -     Ambulatory referral to Neurology     Patient have been counseled extensively about nutrition and exercise. Other issues discussed during this visit include: low cholesterol diet, weight control and daily exercise, foot care, annual eye examinations at  Ophthalmology, importance of adherence with medications and regular follow-up. We also discussed long term complications of uncontrolled diabetes and hypertension.   Return in about 6 months (around 08/17/2024).  The patient was given clear instructions to go to ER or return to medical center if symptoms don't improve, worsen or new problems develop. The patient verbalized understanding. The patient was told to call to get lab results if they haven't heard anything in the next week.   This note has been created with Education officer, environmental. Any transcriptional errors are unintentional.   [name], NP 02/16/2024, 12:09 PM

## 2024-02-16 NOTE — Progress Notes (Unsigned)
 Wants another referral to Neuro Rt. Knee concerns

## 2024-03-01 ENCOUNTER — Telehealth (INDEPENDENT_AMBULATORY_CARE_PROVIDER_SITE_OTHER): Payer: Self-pay

## 2024-03-01 ENCOUNTER — Encounter: Payer: Self-pay | Admitting: Neurology

## 2024-03-01 NOTE — Telephone Encounter (Signed)
 Copied from CRM [id number]. Topic: Referral - Question >> Mar 01, 2024 10:24 AM Loreda Rodriguez T wrote: Reason for CRM: Arianna from [facility] called to speak with someone in reference to the referral for seizure activity and headaches. Please call her back at [phone]

## 2024-03-01 NOTE — Telephone Encounter (Signed)
 Will forward to provider

## 2024-03-14 NOTE — Telephone Encounter (Signed)
 Spoke with patient new referral sent

## 2024-03-19 ENCOUNTER — Institutional Professional Consult (permissible substitution): Payer: Medicaid Other | Admitting: Neurology

## 2024-04-16 ENCOUNTER — Emergency Department (HOSPITAL_COMMUNITY)

## 2024-04-16 ENCOUNTER — Ambulatory Visit (HOSPITAL_COMMUNITY)
Admission: EM | Admit: 2024-04-16 | Discharge: 2024-04-16 | Disposition: A | Discharge status: Home / Self Care | Attending: Physician Assistant | Admitting: Physician Assistant

## 2024-04-16 ENCOUNTER — Encounter (HOSPITAL_COMMUNITY): Payer: Self-pay

## 2024-04-16 ENCOUNTER — Other Ambulatory Visit: Payer: Self-pay

## 2024-04-16 ENCOUNTER — Emergency Department (HOSPITAL_COMMUNITY)
Admission: EM | Admit: 2024-04-16 | Discharge: 2024-04-16 | Discharge status: Against Medical Advice | Attending: Emergency Medicine | Admitting: Emergency Medicine

## 2024-04-16 ENCOUNTER — Encounter (HOSPITAL_COMMUNITY): Payer: Self-pay | Admitting: Emergency Medicine

## 2024-04-16 DIAGNOSIS — Z5321 Procedure and treatment not carried out due to patient leaving prior to being seen by health care provider: Secondary | ICD-10-CM | POA: Insufficient documentation

## 2024-04-16 DIAGNOSIS — R079 Chest pain, unspecified: Secondary | ICD-10-CM | POA: Insufficient documentation

## 2024-04-16 DIAGNOSIS — R0602 Shortness of breath: Secondary | ICD-10-CM

## 2024-04-16 DIAGNOSIS — R000 Tachycardia, unspecified: Secondary | ICD-10-CM | POA: Diagnosis not present

## 2024-04-16 DIAGNOSIS — R42 Dizziness and giddiness: Secondary | ICD-10-CM | POA: Diagnosis not present

## 2024-04-16 DIAGNOSIS — R002 Palpitations: Secondary | ICD-10-CM | POA: Diagnosis not present

## 2024-04-16 LAB — BRAIN NATRIURETIC PEPTIDE: B Natriuretic Peptide: 26 pg/mL (ref 0.0–100.0)

## 2024-04-16 LAB — BASIC METABOLIC PANEL WITH GFR
Anion gap: 11 (ref 5–15)
BUN: 10 mg/dL (ref 6–20)
CO2: 22 mmol/L (ref 22–32)
Calcium: 9.1 mg/dL (ref 8.9–10.3)
Chloride: 106 mmol/L (ref 98–111)
Creatinine, Ser: 0.99 mg/dL (ref 0.44–1.00)
GFR, Estimated: >60 mL/min (ref >60)
Glucose, Bld: 117 mg/dL — ABNORMAL HIGH (ref 70–99)
Potassium: 4 mmol/L (ref 3.5–5.1)
Sodium: 139 mmol/L (ref 135–145)

## 2024-04-16 LAB — CBC
HCT: 39.3 % (ref 36.0–46.0)
Hemoglobin: 12.8 g/dL (ref 12.0–15.0)
MCH: 29.2 pg (ref 26.0–34.0)
MCHC: 32.6 g/dL (ref 30.0–36.0)
MCV: 89.7 fL (ref 80.0–100.0)
Platelets: 271 10*3/uL (ref 150–400)
RBC: 4.38 MIL/uL (ref 3.87–5.11)
RDW: 12.8 % (ref 11.5–15.5)
WBC: 5.8 10*3/uL (ref 4.0–10.5)
nRBC: 0 % (ref 0.0–0.2)

## 2024-04-16 LAB — POCT FASTING CBG KUC MANUAL ENTRY: POCT Glucose (KUC): 132 mg/dL — AB (ref 70–99)

## 2024-04-16 LAB — TROPONIN I (HIGH SENSITIVITY)
Troponin I (High Sensitivity): 3 ng/L (ref <18)
Troponin I (High Sensitivity): 4 ng/L (ref <18)

## 2024-04-16 NOTE — ED Provider Notes (Signed)
 [facility]    CSN: [id number] Arrival date & time: 04/16/24  1429      History   Chief Complaint Chief Complaint  Patient presents with   Shortness of Breath    HPI Sheila [name] is a 53 y.o. female.   I was called into triage to evaluate patient.  She reports a weeklong history of shortness of breath that is significantly worsened in the past few hours to the point that she has not been able to catch her breath.  She does have a history of asthma and so has been using both her Symbicort  and albuterol  without improvement of symptoms.  She reports associated palpitations, lightheadedness, nausea, chest pain.  Chest pain is localized to her right axilla and chest region, rated 10 on a 0-10 pain scale, described as sharp.  She denies any history of VTE event.  Denies any recent hospitalization, immobilization, surgical procedure, COVID diagnosis.  She does have past medical history of malignancy but is not currently receiving treatment.  She denies any leg swelling but has had pain in her right knee and calf starting today.    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    Patient Active Problem List   Diagnosis Date Noted   Hepatitis B immune 08/09/2023   Seizure-like activity (HCC) 08/09/2021   Postmenopausal bleeding 06/02/2021   Genetic testing 04/21/2021   Family history of breast cancer    Family history of bone cancer    Family history of prostate cancer    Family history of throat cancer    Malignant neoplasm of upper-outer quadrant of left breast in female, 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   Women's annual routine gynecological examination  02/25/2021   Screening examination for STD (sexually transmitted disease) 02/25/2021   Skin lesion 02/25/2021   Chest pain at rest 05/30/2014   Ejection fraction    Abnormal EKG 04/24/2014   Chest discomfort 04/23/2014   Panic attack 04/23/2014   OBESITY 01/15/2009   HAIR LOSS 01/15/2009   ANEMIA-NOS 01/14/2009    Past Surgical History:  Procedure Laterality Date   AXILLARY LYMPH NODE BIOPSY Left 04/29/2021   Procedure: AXILLARY LYMPH NODE BIOPSY;  Surgeon: Enid Harry, MD;  Location[facility] OR;  Service: General;  Laterality: Left;   BREAST LUMPECTOMY WITH RADIOACTIVE SEED AND SENTINEL LYMPH NODE BIOPSY Left 04/29/2021   Procedure: LEFT BREAST LUMPECTOMY WITH RADIOACTIVE SEED;  Surgeon: Enid Harry, MD;  Location: [facility] OR;  Service: General;  Laterality: Left;   CARDIAC CATHETERIZATION     LEFT HEART CATHETERIZATION WITH CORONARY ANGIOGRAM N/A 05/30/2014   Procedure: LEFT HEART CATHETERIZATION WITH CORONARY ANGIOGRAM;  Surgeon: Peter M Swaziland, MD;  Location: [facility] CATH LAB;  Service: Cardiovascular;  Laterality: N/A;   TUBAL LIGATION      OB History     Gravida  8   Para  6   Term  4   Preterm  2   AB  2   Living  4      SAB  2   IAB      Ectopic  Multiple      Live Births               Home Medications    Prior to Admission medications   Medication Sig Start Date End Date Taking? Authorizing Provider  albuterol  (VENTOLIN  HFA) 108 (90 Base) MCG/ACT inhaler Inhale 2 puffs into the lungs every 6 (six) hours as needed for wheezing or shortness of breath. 02/14/24   Hunsucker, Archer Kobs, MD  budesonide -formoterol  (SYMBICORT ) 160-4.5 MCG/ACT inhaler Inhale 2 puffs into the lungs 2 (two) times daily. 02/14/24   Hunsucker, Archer Kobs, MD  metoprolol  tartrate (LOPRESSOR ) 50 MG tablet Take 1 tablet (50 mg total) by mouth 2 (two) times daily. 11/10/23 02/08/24  Lenise Quince, MD    Family History Family History  Problem Relation Age of Onset   Heart  disease Mother    Hypertension Mother    Heart Problems Mother    Hyperlipidemia Mother    Breast cancer Mother 68   Diabetes Father    Seizures Father    Heart disease Sister    Crohn's disease Sister    Crohn's disease Brother    Hypertension Maternal Aunt    Colon cancer Maternal Uncle    Hypertension Maternal Uncle    Prostate cancer Maternal Uncle    Prostate cancer Maternal Uncle    Cancer Paternal Aunt        unknown type   Cancer Paternal Uncle        unknown type   Cancer Paternal Uncle        unknown type   Throat cancer Paternal Uncle    Hypertension Maternal Grandmother    Stroke Maternal Grandmother    Breast cancer Maternal Grandmother 67   Stroke Maternal Grandfather    Pancreatic cancer Cousin    Breast cancer Cousin        dx 29s, bilateral mastectomies (maternal first cousin)   Bone cancer Nephew 11       knee, great-nephew   Breast cancer Other        great-aunt (MGF's sister)   Breast cancer Other        first cousin once removed (MGF's niece)   Breast cancer Other        first cousin once removed (MGF's niece)   Cancer Other        unknown type, great-uncle (MGM's brother)   Breast cancer Other        first cousin once removed (MGM's niece)   Breast cancer Other        first cousin once removed (MGM's niece)   Prostate cancer Other        first cousin once removed (MGM's nephew)   Breast cancer Other        second cousin (MGM's great-niece)   Cancer Other        unknown type, dx 40s (maternal aunt's granddaughter)   Sickle cell anemia Other    Esophageal cancer Neg Hx    Stomach cancer Neg Hx    Rectal cancer Neg Hx     Social History Social History   Tobacco Use   Smoking status: Never   Smokeless tobacco: Never  Vaping Use   Vaping status: Never Used  Substance Use Topics   Alcohol  use: No   Drug use: No     Allergies   Anesthetics, amide and Other   Review of Systems Review of Systems  Constitutional:  Positive for  activity change. Negative for appetite change, fatigue  and fever.  Respiratory:  Positive for shortness of breath. Negative for cough.   Cardiovascular:  Positive for chest pain and palpitations. Negative for leg swelling.  Gastrointestinal:  Positive for nausea. Negative for abdominal pain, diarrhea and vomiting.  Neurological:  Positive for light-headedness. Negative for dizziness, syncope and headaches.     Physical Exam Triage Vital Signs ED Triage Vitals  Encounter Vitals Group     BP 04/16/24 1448 (!) 151/91     Systolic BP Percentile --      Diastolic BP Percentile --      Pulse Rate 04/16/24 1448 (!) 115     Resp 04/16/24 1448 (!) 22     Temp --      Temp src --      SpO2 04/16/24 1448 94 %     Weight --      Height --      Head Circumference --      Peak Flow --      Pain Score 04/16/24 1436 10     Pain Loc --      Pain Education --      Exclude from Growth Chart --    No data found.  Updated Vital Signs BP (!) 151/91   Pulse (!) 115   Resp (!) 22   LMP 04/03/2018 (Within Days)   SpO2 94%   Visual Acuity Right Eye Distance:   Left Eye Distance:   Bilateral Distance:    Right Eye Near:   Left Eye Near:    Bilateral Near:     Physical Exam Vitals reviewed.  Constitutional:      General: She is awake. She is not in acute distress.    Appearance: Normal appearance. She is well-developed. She is ill-appearing.     Comments: Very pleasant female appears stated age laying on exam room table with her eyes closed obviously uncomfortable but in no acute distress  HENT:     Head: Normocephalic and atraumatic.  Cardiovascular:     Rate and Rhythm: Regular rhythm. Tachycardia present.     Heart sounds: Normal heart sounds, S1 normal and S2 normal. No murmur heard. Pulmonary:     Effort: Pulmonary effort is normal.     Breath sounds: Examination of the right-lower field reveals decreased breath sounds. Examination of the left-lower field reveals decreased breath  sounds. Decreased breath sounds present. No wheezing, rhonchi or rales.  Psychiatric:        Behavior: Behavior is cooperative.      UC Treatments / Results  Labs (all labs ordered are listed, but only abnormal results are displayed) Labs Reviewed  POCT FASTING CBG [facility] MANUAL ENTRY - Abnormal; Notable for the following components:      Result Value   POCT Glucose ([facility]) 132 (*)    All other components within normal limits    EKG   Radiology No results found.  Procedures Procedures (including critical care time)  Medications Ordered in UC Medications - No data to display  Initial Impression / Assessment and Plan / UC Course  I have reviewed the triage vital signs and the nursing notes.  Pertinent labs & imaging results that were available during my care of the patient were reviewed by me and considered in my medical decision making (see chart for details).     Patient is ill-appearing, tachycardic.  EKG was obtained that showed sinus tachycardia with T wave inversion in inferior and lateral leads.  Compared to 01/02/2024 tracing sinus  tachycardia replaces normal sinus rhythm and she has additional T wave inversions.  Her blood sugar was appropriate.  Given her constellation of symptoms including lightheadedness, chest pain, shortness of breath, tachycardia I recommend she go to the emergency room for further evaluation and management given her limited capabilities of urgent care.  Requested that she be transported by [facility] but she declined this due to concern for cost and so we will have her daughter who brought her here today take her directly to [facility], ER.  She was stable at the time of discharge.  Final Clinical Impressions(s) / UC Diagnoses   Final diagnoses:  Shortness of breath  Right-sided chest pain  Tachycardia  Lightheadedness   Discharge Instructions   None    ED Prescriptions   None    PDMP not reviewed this encounter.   Budd Cargo,  [name] 04/16/24 1505

## 2024-04-16 NOTE — ED Notes (Signed)
 Pt left d/t wait time per registration.

## 2024-04-16 NOTE — ED Triage Notes (Signed)
 Sob for a week.  Today symptoms worsened.  Reports she cannot catch her breath.reports right side of chest is hurting since sitting down in intake.  Denies any incident or upsetting issue today

## 2024-04-16 NOTE — ED Notes (Signed)
 Unable to get labs rn will try at

## 2024-04-16 NOTE — ED Provider Triage Note (Signed)
 Emergency Medicine Provider Triage Evaluation Note  Sheila [name] , a 53 y.o. female  was evaluated in triage.  Pt complains of shortness of breath, dizziness, lightheadedness. Ongoing for one week, worse today. No improvement with home inhalers, sent to ED by UC.  Review of Systems  Positive: Shob, lightheadedness, dizziness Negative:   Physical Exam  BP (!) 151/87 (BP Location: Right Arm)   Pulse 85   Temp 97.7 F (36.5 C)   Resp 20   Ht 5\' 9"  (1.753 m)   Wt 95.3 kg   LMP 04/03/2018 (Within Days)   SpO2 99%   BMI 31.01 kg/m  Gen:   Awake, no distress   Resp:  Normal effort  MSK:   Moves extremities without difficulty  Other:  Clear breath sounds bilaterally  Medical Decision Making  Medically screening exam initiated at 4:11 PM.  Appropriate orders placed.  Reinhold Carbine was informed that the remainder of the evaluation will be completed by another provider, this initial triage assessment does not replace that evaluation, and the importance of remaining in the ED until their evaluation is complete.  Workup initiated in triage    Sheila [name], [place] 04/16/24 1614

## 2024-04-16 NOTE — ED Triage Notes (Signed)
 Pt reports shortness of breath x 1 week associated with chest pain, dizziness and feeling lightheaded. She was sent here from Urgent Care. She states "I just can't seem to catch my breath" and states she feels heart palpitations.

## 2024-04-18 NOTE — Progress Notes (Signed)
 @Patient  ID: Sheila [name], female    DOB: 03/07/1971, 53 y.o.   MRN: [id number]  Chief Complaint  Patient presents with   Consult    Pt states she's on Cpap can't walk very far w/o having SOB.    Referring provider: Marius Siemens, NP  HPI:   53 y.o. woman whom are seen for evaluation of dyspnea on exertion.  Most recent PCP note reviewed.  Most recent neurology note reviewed.  Patient does not exertion chief complaint.  Present for some time.  Worse on inclines or stairs.  No time of day when things are worse.  No position maintenance better or worse.  No seasonal environmental factors she can do vitamin things better or worse.  No clear alleviating or exacerbating factors.  Resting gets better.  Not much cough.  Had CT scan 09/2022 that showed thickened bronchioles otherwise clear.  Chest x-ray 09/2022 clear.  We discussed broad differential diagnosis for this.  We discussed workup for this including cardiac and pulmonary causes.   Questionaires / Pulmonary Flowsheets:   ACT:      No data to display          MMRC:     No data to display          Epworth:      No data to display          Tests:   FENO:  No results found for: "NITRICOXIDE"  PFT:     No data to display          WALK:      No data to display          Imaging:  Personally reviewed  Lab Results: Personally reviewed CBC    Component Value Date/Time   WBC 5.8 04/16/2024 1919   RBC 4.38 04/16/2024 1919   HGB 12.8 04/16/2024 1919   HGB 12.5 05/07/2022 1538   HGB 12.5 04/03/2022 1323   HCT 39.3 04/16/2024 1919   HCT 37.5 04/03/2022 1323   PLT 271 04/16/2024 1919   PLT 277 05/07/2022 1538   PLT 267 04/03/2022 1323   MCV 89.7 04/16/2024 1919   MCV 87 04/03/2022 1323   MCH 29.2 04/16/2024 1919   MCHC 32.6 04/16/2024 1919   RDW 12.8 04/16/2024 1919   RDW 12.9 04/03/2022 1323   LYMPHSABS 1.6 01/02/2024 2232   LYMPHSABS 1.1 04/03/2022 1323   MONOABS 0.4  01/02/2024 2232   EOSABS 0.0 01/02/2024 2232   EOSABS 0.1 04/03/2022 1323   BASOSABS 0.0 01/02/2024 2232   BASOSABS 0.0 04/03/2022 1323    BMET    Component Value Date/Time   NA 139 04/16/2024 1919   NA 143 06/30/2020 1553   K 4.0 04/16/2024 1919   CL 106 04/16/2024 1919   CO2 22 04/16/2024 1919   GLUCOSE 117 (H) 04/16/2024 1919   BUN 10 04/16/2024 1919   BUN 8 06/30/2020 1553   CREATININE 0.99 04/16/2024 1919   CREATININE 0.95 05/07/2022 1538   CALCIUM  9.1 04/16/2024 1919   GFRNONAA >60 04/16/2024 1919   GFRNONAA >60 05/07/2022 1538   GFRAA 82 06/30/2020 1553    BNP    Component Value Date/Time   BNP 26.0 04/16/2024 1919    ProBNP No results found for: "PROBNP"  Specialty Problems   None   Allergies  Allergen Reactions   Anesthetics, Amide Other (See Comments)    seizures   Other Other (See Comments)    General Anesthesia caused seizures-Propofol   Immunization History  Administered Date(s) Administered   Tdap 11/18/2023    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   budesonide -formoterol  (SYMBICORT ) 160-4.5 MCG/ACT inhaler Inhale 2 puffs into the lungs 2 (two) times daily.   [DISCONTINUED] fluconazole  (DIFLUCAN ) 150 MG tablet Take 150 mg by mouth once.   metoprolol   tartrate (LOPRESSOR ) 50 MG tablet Take 1 tablet (50 mg total) by mouth 2 (two) times daily.   No facility-administered encounter medications on file as of 02/14/2024.     Review of Systems  Review of Systems  No chest pain with exertion.  No orthopnea or PND.  Comprehensive review of systems otherwise negative. Physical Exam  BP 130/85 (BP Location: Right Arm, Patient Position: Sitting, Cuff Size: Normal)   Pulse 80   Ht 5\' 9"  (1.753 m)   Wt 207 lb (93.9 kg)   LMP 04/03/2018 (Within Days)   SpO2 98%   BMI 30.57 kg/m   Wt Readings from Last 5 Encounters:  04/16/24 210 lb (95.3 kg)  02/16/24 202 lb (91.6 kg)  02/14/24 207 lb (93.9 kg)  01/05/24 204 lb (92.5 kg)  01/02/24 200 lb (90.7 kg)    BMI Readings from Last 5 Encounters:  04/16/24 31.01 kg/m  02/16/24 29.83 kg/m  02/14/24 30.57 kg/m  01/05/24 30.13 kg/m  01/02/24 29.53 kg/m     Physical Exam General: Sitting in chair, no acute distress Eyes: EOMI, no icterus Neck: No JVD appreciated Pulmonary: Clear, no work of breathing Cardiovascular: Warm, no edema MSK: No synovitis Neuro: Normal gait, no weakness Psych: Normal mood, full affect   Assessment & Plan:   Dyspnea on exertion: Quite possibly multifactorial, concern for contribution from asthma given thickened bronchioles on CT scan 09/2022.  Lungs otherwise clear.  PFTs for further evaluation.  Echocardiogram for further evaluation.  Chest discomfort: Seems MSK.  Chest x-ray today for further evaluation.  Asthma: Based on thickened bronchioles and symptoms of dyspnea on exertion.  Start with Symbicort  2 puffs twice a day.  Rescue inhaler albuterol  as well.  Assess response.   Return in about 3 months (around 05/15/2024) for f/u Dr. Marygrace Snellen, after PFT.   Guerry Leek, MD 04/18/2024

## 2024-05-06 ENCOUNTER — Ambulatory Visit
Admission: RE | Admit: 2024-05-06 | Discharge: 2024-05-06 | Disposition: A | Discharge status: Home / Self Care | Payer: Self-pay | Source: Ambulatory Visit | Attending: Internal Medicine | Admitting: Internal Medicine

## 2024-05-06 ENCOUNTER — Ambulatory Visit (HOSPITAL_COMMUNITY)
Admission: RE | Admit: 2024-05-06 | Discharge: 2024-05-06 | Disposition: A | Discharge status: Home / Self Care | Source: Ambulatory Visit | Attending: Internal Medicine | Admitting: Internal Medicine

## 2024-05-06 ENCOUNTER — Emergency Department (HOSPITAL_COMMUNITY)

## 2024-05-06 ENCOUNTER — Ambulatory Visit: Payer: Self-pay | Admitting: Internal Medicine

## 2024-05-06 VITALS — BP 134/86 | HR 78 | Temp 98.5°F | Resp 16

## 2024-05-06 DIAGNOSIS — M25561 Pain in right knee: Secondary | ICD-10-CM | POA: Insufficient documentation

## 2024-05-06 DIAGNOSIS — M7989 Other specified soft tissue disorders: Secondary | ICD-10-CM | POA: Insufficient documentation

## 2024-05-06 DIAGNOSIS — M25461 Effusion, right knee: Secondary | ICD-10-CM

## 2024-05-06 MED ORDER — DEXAMETHASONE SODIUM PHOSPHATE 10 MG/ML IJ SOLN
10.0000 mg | Freq: Once | INTRAMUSCULAR | Status: AC
  Administered 2024-05-06: 10 mg via INTRAMUSCULAR

## 2024-05-06 MED ORDER — PREDNISONE 20 MG PO TABS: 40.0000 mg | ORAL_TABLET | Freq: Every day | ORAL | 0 refills | Status: AC

## 2024-05-06 NOTE — ED Triage Notes (Signed)
 Pt c/o paint/swelling of right knee x 1 week-denies injury-last tylenol  last night-NAD-limping gait

## 2024-05-06 NOTE — Discharge Instructions (Addendum)
 Right knee pain and swelling.  This is likely secondary to inflammation in the knee possibly a Baker's cyst and mild knee effusion (swelling in the joint).  We will get an x-ray as an outpatient.  We will also treat with anti-inflammatories, compression and ice.  If symptoms do not improve we may need to consider following up with an orthopedist.  We will treat with the following: Decadron  injection given today. This is a steroid to help with inflammation and pain. Start tomorrow 6/23: Prednisone  40 mg (2 tablets) once daily for 5 days. Take this in the morning.  This is a steroid to help with inflammation and pain. Ice the area 2-3 times daily for 10-15 minutes to help with pain and swelling. Do not apply ice directly to the skin.  Use compression wrap on the knee when up walking Go to [facility] outpatient imaging to get an x-ray of your right knee as we did not have x-ray today.  You do not need to go through the emergency room. May return to urgent care if needed however if symptoms are not improving may need to consider following up with an orthopedist

## 2024-05-06 NOTE — ED Provider Notes (Signed)
 [facility]    CSN: [id number] Arrival date & time: 05/06/24  1104      History   Chief Complaint Chief Complaint  Patient presents with   Knee Pain    Swelling and pain around right knee. - Entered by patient    HPI Sheila [name] is a 53 y.o. female.   53 year old female who presents to urgent care with complaints of right knee pain.  This started about a week ago.  This is associated with swelling.  She did not have any specific injury to the area.  She has not been walking more than usual.  She does not have a job that makes her stand on her feet for long periods.  She has not injured her knee in the past.  She has been trying over-the-counter medication without success.  She had a lot of trouble sleeping last night due to the pain.  She denies any locking or giving out.  She denies any fevers or chills.   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    Patient Active Problem List   Diagnosis Date Noted   Hepatitis B immune 08/09/2023   Seizure-like activity (HCC) 08/09/2021   Postmenopausal bleeding 06/02/2021   Genetic testing 04/21/2021   Family history of breast cancer    Family history of bone cancer    Family history of prostate cancer    Family history of throat cancer    Malignant neoplasm of upper-outer quadrant of left breast in female, 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   Women's annual routine gynecological examination 02/25/2021   Screening examination for STD (sexually transmitted disease) 02/25/2021   Skin lesion 02/25/2021   Chest pain at rest 05/30/2014   Ejection fraction    Abnormal EKG 04/24/2014   Chest  discomfort 04/23/2014   Panic attack 04/23/2014   OBESITY 01/15/2009   HAIR LOSS 01/15/2009   ANEMIA-NOS 01/14/2009    Past Surgical History:  Procedure Laterality Date   AXILLARY LYMPH NODE BIOPSY Left 04/29/2021   Procedure: AXILLARY LYMPH NODE BIOPSY;  Surgeon: Ebbie Cough, MD;  Location: [facility] OR;  Service: General;  Laterality: Left;   BREAST LUMPECTOMY WITH RADIOACTIVE SEED AND SENTINEL LYMPH NODE BIOPSY Left 04/29/2021   Procedure: LEFT BREAST LUMPECTOMY WITH RADIOACTIVE SEED;  Surgeon: Ebbie Cough, MD;  Location: [facility] OR;  Service: General;  Laterality: Left;   CARDIAC CATHETERIZATION     LEFT HEART CATHETERIZATION WITH CORONARY ANGIOGRAM N/A 05/30/2014   Procedure: LEFT HEART CATHETERIZATION WITH CORONARY ANGIOGRAM;  Surgeon: Peter M [place], MD;  Location: [facility] CATH LAB;  Service: Cardiovascular;  Laterality: N/A;   TUBAL LIGATION      OB History     Gravida  8   Para  6   Term  4   Preterm  2   AB  2   Living  4      SAB  2   IAB      Ectopic      Multiple      Live Births  Home Medications    Prior to Admission medications   Medication Sig Start Date End Date Taking? Authorizing Provider  predniSONE  (DELTASONE ) 20 MG tablet Take 2 tablets (40 mg total) by mouth daily with breakfast for 5 days. 05/06/24 05/11/24 Yes Adhira Jamil A, [name]  albuterol  (VENTOLIN  HFA) 108 (90 Base) MCG/ACT inhaler Inhale 2 puffs into the lungs every 6 (six) hours as needed for wheezing or shortness of breath. 02/14/24   Hunsucker, Donnice SAUNDERS, MD  budesonide -formoterol  (SYMBICORT ) 160-4.5 MCG/ACT inhaler Inhale 2 puffs into the lungs 2 (two) times daily. 02/14/24   Hunsucker, Donnice SAUNDERS, MD  metoprolol  tartrate (LOPRESSOR ) 50 MG tablet Take 1 tablet (50 mg total) by mouth 2 (two) times daily. 11/10/23 02/08/24  Pietro Redell RAMAN, MD    Family History Family History  Problem Relation Age of Onset   Heart disease Mother    Hypertension Mother    Heart  Problems Mother    Hyperlipidemia Mother    Breast cancer Mother 15   Diabetes Father    Seizures Father    Heart disease Sister    Crohn's disease Sister    Crohn's disease Brother    Hypertension Maternal Aunt    Colon cancer Maternal Uncle    Hypertension Maternal Uncle    Prostate cancer Maternal Uncle    Prostate cancer Maternal Uncle    Cancer Paternal Aunt        unknown type   Cancer Paternal Uncle        unknown type   Cancer Paternal Uncle        unknown type   Throat cancer Paternal Uncle    Hypertension Maternal Grandmother    Stroke Maternal Grandmother    Breast cancer Maternal Grandmother 59   Stroke Maternal Grandfather    Pancreatic cancer Cousin    Breast cancer Cousin        dx 7s, bilateral mastectomies (maternal first cousin)   Bone cancer Nephew 11       knee, great-nephew   Breast cancer Other        great-aunt (MGF's sister)   Breast cancer Other        first cousin once removed (MGF's niece)   Breast cancer Other        first cousin once removed (MGF's niece)   Cancer Other        unknown type, great-uncle (MGM's brother)   Breast cancer Other        first cousin once removed (MGM's niece)   Breast cancer Other        first cousin once removed (MGM's niece)   Prostate cancer Other        first cousin once removed (MGM's nephew)   Breast cancer Other        second cousin (MGM's great-niece)   Cancer Other        unknown type, dx 30s (maternal aunt's granddaughter)   Sickle cell anemia Other    Esophageal cancer Neg Hx    Stomach cancer Neg Hx    Rectal cancer Neg Hx     Social History Social History   Tobacco Use   Smoking status: Never   Smokeless tobacco: Never  Vaping Use   Vaping status: Never Used  Substance Use Topics   Alcohol  use: No   Drug use: No     Allergies   Anesthetics, amide and Other   Review of Systems Review of Systems   Physical Exam Triage Vital Signs ED  Triage Vitals  Encounter Vitals Group      BP 05/06/24 1125 134/86     Girls Systolic BP Percentile --      Girls Diastolic BP Percentile --      Boys Systolic BP Percentile --      Boys Diastolic BP Percentile --      Pulse Rate 05/06/24 1125 78     Resp 05/06/24 1125 16     Temp 05/06/24 1125 98.5 F (36.9 C)     Temp Source 05/06/24 1125 Oral     SpO2 05/06/24 1125 97 %     Weight --      Height --      Head Circumference --      Peak Flow --      Pain Score 05/06/24 1123 10     Pain Loc --      Pain Education --      Exclude from Growth Chart --    No data found.  Updated Vital Signs BP 134/86 (BP Location: Right Arm)   Pulse 78   Temp 98.5 F (36.9 C) (Oral)   Resp 16   LMP 04/03/2018 (Within Days)   SpO2 97%   Visual Acuity Right Eye Distance:   Left Eye Distance:   Bilateral Distance:    Right Eye Near:   Left Eye Near:    Bilateral Near:     Physical Exam Vitals and nursing note reviewed.  Constitutional:      General: She is not in acute distress.    Appearance: She is well-developed.  HENT:     Head: Normocephalic and atraumatic.   Eyes:     Conjunctiva/sclera: Conjunctivae normal.    Cardiovascular:     Rate and Rhythm: Normal rate and regular rhythm.     Heart sounds: No murmur heard. Pulmonary:     Effort: Pulmonary effort is normal. No respiratory distress.     Breath sounds: Normal breath sounds.  Abdominal:     Palpations: Abdomen is soft.     Tenderness: There is no abdominal tenderness.   Musculoskeletal:        General: No swelling.     Cervical back: Neck supple.     Right knee: Swelling and effusion present. No deformity or crepitus. Decreased range of motion. Tenderness (Posterior) present over the lateral joint line and patellar tendon. No LCL laxity, MCL laxity or ACL laxity. Normal alignment, normal meniscus and normal patellar mobility. Normal pulse.     Instability Tests: Anterior drawer test negative. Posterior drawer test negative. Medial McMurray test negative  and lateral McMurray test negative.     Left knee: Normal.   Skin:    General: Skin is warm and dry.     Capillary Refill: Capillary refill takes less than 2 seconds.   Neurological:     Mental Status: She is alert.   Psychiatric:        Mood and Affect: Mood normal.      UC Treatments / Results  Labs (all labs ordered are listed, but only abnormal results are displayed) Labs Reviewed - No data to display  EKG   Radiology No results found.  Procedures Procedures (including critical care time)  Medications Ordered in UC Medications  dexamethasone  (DECADRON ) injection 10 mg (has no administration in time range)    Initial Impression / Assessment and Plan / UC Course  I have reviewed the triage vital signs and the nursing notes.  Pertinent labs & imaging  results that were available during my care of the patient were reviewed by me and considered in my medical decision making (see chart for details).     Acute pain of right knee  Swelling of right knee  Right knee pain and swelling.  This is likely secondary to inflammation in the knee possibly a Baker's cyst and mild knee effusion (swelling in the joint).  We will get an x-ray as an outpatient.  We will also treat with anti-inflammatories, compression and ice.  We did offer Toradol  injection today however patient declines.  If symptoms do not improve we may need to consider following up with an orthopedist.  We will treat with the following: Decadron  injection given today. This is a steroid to help with inflammation and pain. Start tomorrow 6/23: Prednisone  40 mg (2 tablets) once daily for 5 days. Take this in the morning.  This is a steroid to help with inflammation and pain. Ice the area 2-3 times daily for 10-15 minutes to help with pain and swelling. Do not apply ice directly to the skin.  Use compression wrap on the knee when up walking Go to [facility] outpatient imaging to get an x-ray of your right knee  as we did not have x-ray today.  You do not need to go through the emergency room. May return to urgent care if needed however if symptoms are not improving may need to consider following up with an orthopedist   Final Clinical Impressions(s) / UC Diagnoses   Final diagnoses:  Acute pain of right knee  Swelling of right knee     Discharge Instructions      Right knee pain and swelling.  This is likely secondary to inflammation in the knee possibly a Baker's cyst and mild knee effusion (swelling in the joint).  We will get an x-ray as an outpatient.  We will also treat with anti-inflammatories, compression and ice.  If symptoms do not improve we may need to consider following up with an orthopedist.  We will treat with the following: Decadron  injection given today. This is a steroid to help with inflammation and pain. Start tomorrow 6/23: Prednisone  40 mg (2 tablets) once daily for 5 days. Take this in the morning.  This is a steroid to help with inflammation and pain. Ice the area 2-3 times daily for 10-15 minutes to help with pain and swelling. Do not apply ice directly to the skin.  Use compression wrap on the knee when up walking Go to [facility] outpatient imaging to get an x-ray of your right knee as we did not have x-ray today.  You do not need to go through the emergency room. May return to urgent care if needed however if symptoms are not improving may need to consider following up with an orthopedist    ED Prescriptions     Medication Sig Dispense Auth. Provider   predniSONE  (DELTASONE ) 20 MG tablet Take 2 tablets (40 mg total) by mouth daily with breakfast for 5 days. 10 tablet Teresa Almarie LABOR, [place]      PDMP not reviewed this encounter.   Teresa Almarie LABOR, [name] 05/06/24 1205

## 2024-05-11 ENCOUNTER — Other Ambulatory Visit

## 2024-05-11 ENCOUNTER — Ambulatory Visit: Admitting: Neurology

## 2024-05-11 ENCOUNTER — Encounter: Payer: Self-pay | Admitting: Neurology

## 2024-05-11 VITALS — BP 121/82 | HR 82 | Ht 69.0 in | Wt 208.4 lb

## 2024-05-11 DIAGNOSIS — R519 Headache, unspecified: Secondary | ICD-10-CM

## 2024-05-11 DIAGNOSIS — R4020 Unspecified coma: Secondary | ICD-10-CM | POA: Diagnosis not present

## 2024-05-11 DIAGNOSIS — R413 Other amnesia: Secondary | ICD-10-CM | POA: Diagnosis not present

## 2024-05-11 LAB — TSH: TSH: 1.33 m[IU]/L

## 2024-05-11 LAB — VITAMIN B12: Vitamin B-12: 387 pg/mL (ref 200–1100)

## 2024-05-11 LAB — VITAMIN D 25 HYDROXY (VIT D DEFICIENCY, FRACTURES): Vit D, 25-Hydroxy: 13 ng/mL — ABNORMAL LOW (ref 30–100)

## 2024-05-11 NOTE — Patient Instructions (Signed)
 Good to meet you!  Have bloodwork done for TSH, B12, vitamin D levels  2. Schedule MRI brain with and without contrast  3. Schedule 1-hour EEG. If normal, we will do a 2-day home EEG  4. Follow-up after tests, call for any changes   RECOMMENDATIONS FOR ALL PATIENTS WITH MEMORY PROBLEMS: 1. Continue to exercise (Recommend 30 minutes of walking everyday, or 3 hours every week) 2. Increase social interactions - continue going to [place] and enjoy social gatherings with friends and family 3. Eat healthy, avoid fried foods and eat more fruits and vegetables 4. Maintain adequate blood pressure, blood sugar, and blood cholesterol level. Reducing the risk of stroke and cardiovascular disease also helps promoting better memory. 5. Avoid stressful situations. Live a simple life and avoid aggravations. Organize your time and prepare for the next day in anticipation. 6. Sleep well, avoid any interruptions of sleep and avoid any distractions in the bedroom that may interfere with adequate sleep quality 7. Avoid sugar, avoid sweets as there is a strong link between excessive sugar intake, diabetes, and cognitive impairment We discussed the Mediterranean diet, which has been shown to help patients reduce the risk of progressive memory disorders and reduces cardiovascular risk. This includes eating fish, eat fruits and green leafy vegetables, nuts like almonds and hazelnuts, walnuts, and also use olive oil. Avoid fast foods and fried foods as much as possible. Avoid sweets and sugar as sugar use has been linked to worsening of memory function.

## 2024-05-11 NOTE — Progress Notes (Signed)
 NEUROLOGY CONSULTATION NOTE  Sheila [name] MRN: [id number] DOB: 1971/08/14  Referring provider: Rosaline Bohr, NP Primary care provider: Rosaline Bohr, NP  Reason for consult:  seizure-like activity  Thank you for your kind referral of Sheila [name] for consultation of the above symptoms. Although her history is well known to you, please allow me to reiterate it for the purpose of our medical record. She is alone in the office today. Records and images were personally reviewed where available.   HISTORY OF PRESENT ILLNESS: This is a pleasant 53 year old right-handed woman with a history of breast cancer s/p lumpectomy and radiation, palpitations, presenting for evaluation of seizure-like activity. She reports several symptoms, including memory loss, bad headaches, and blacking out. She reports all these symptoms started after colonoscopy in 2022. Since then, things have been different. She repeatedly states I want my life back. She was previously independent with all ADLs, however since 2022 her daughter who is her POA has been managing finances, meals. She stopped driving as well, she was getting lost and could not remember where she was going, having to call her family for help. She manages her medications but reports there is a medication her oncologist prescribed but she has not been taking it because she does not remember being told to take it. Her last visit with Dr. Lanny was in 2023. She does not cook anymore, she had forgotten something on the stove. She states her daughter does everything for me. She lives with her disabled son. Her children have also mentioned forgetfulness. Her maternal grandmother had dementia.   She denies any history of headaches prior to the colonoscopy. Since then, she has had headaches with pressure in the right parietal and left occipital region, like it's clouded. There is some nausea and light sensitivity. For the past couple of  months, she has been taking Tylenol  2 tablets daily for headaches that last for hours. It eases but does not fully relieve the pain. She notices the pressure when she gets up in the morning. She is always lightheaded, she has seen Cardiology and had a holter monitor in 11/2022 where it was noted that symptoms correlate with infrequent ventricular ectopy. She reports feeling the palpitations and feels like she needs more oxygen. She would be sitting and feel like it is hard to breathe. On last Cardiology visit in 12/2023, it was felt that she is having severe daytime somnolence with frequent episodes of falling asleep. She still reported apneic spells even with her CPAP machine. She was advised to follow-up with her Sleep doctor, however she is not sure who is managing her CPAP. She saw Pulmonary in 02/2024 and is scheduled for PFTs next week. She states she cannot walk far, getting short of breath.   She states she keeps blacking out, she would be doing something then a certain part of her body can't move, then she wakes up on the ground with EMS around her. She does not recall any tongue bite or incontinence. She states these were initially happening all the time but have quieted down, last passing out was 1-2 months ago. She has been told she is staring off sometimes. She denies any olfactory/gustatory hallucinations or myoclonic jerks. She has numbness and tingling in fingers of both hands. She has had left-sided neck pain for the past couple of weeks. She has pain in her right knee, denies any falls. She was in the ER for this and started on Prednisone . She reports mood  is good.  On review of records from 2022, she was evaluated by inpatient Neurology after having 2 seizures with generalized body shaking while in the recovery unit after colonoscopy. EMS was called and she was given benzodiazepines then brought to the ER. In the ER, she had another couple of seizures and was very somnolent after receiving a  total of 9mg  Versed . 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 She had a routine and 24-hour EEG which were normal, typical events were not captured. 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 She was evaluated by epileptologist Dr. Gregg in 10/2021 for memory loss and seizure. MMSE was 29/30. EEG in 11/2021 was normal. She was reporting headaches and started on amitriptyline  but unclear if she took it. On last visit in 06/2022, she was started on Citalopram , she is not taking this medication. She was in the ER in 12/2023 for headache, right-sided burning sensation. She got up and had vertigo, lightheadedness, headache, burning on the right side. She then passed out per EMS and fell and hit her head, head CT no acute changes.   She reports a strong family history of seizures, her father had many seizures and died from them. Her son, daughter (has pseudoseizures), and nephew have seizures. She had a normal birth and early development.  There is no history of febrile convulsions, CNS infections such as meningitis/encephalitis, significant traumatic brain injury, neurosurgical procedures   PAST MEDICAL HISTORY: 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    PAST SURGICAL HISTORY: Past Surgical History:  Procedure Laterality Date   AXILLARY LYMPH NODE BIOPSY Left 04/29/2021   Procedure: AXILLARY  LYMPH NODE BIOPSY;  Surgeon: Ebbie Cough, MD;  Location: [facility] OR;  Service: General;  Laterality: Left;   BREAST LUMPECTOMY WITH RADIOACTIVE SEED AND SENTINEL LYMPH NODE BIOPSY Left 04/29/2021   Procedure: LEFT BREAST LUMPECTOMY WITH RADIOACTIVE SEED;  Surgeon: Ebbie Cough, MD;  Location: [facility] OR;  Service: General;  Laterality: Left;   CARDIAC CATHETERIZATION     LEFT HEART CATHETERIZATION WITH CORONARY ANGIOGRAM N/A 05/30/2014   Procedure: LEFT HEART CATHETERIZATION WITH CORONARY ANGIOGRAM;  Surgeon: Peter M [place], MD;  Location: [facility] CATH LAB;  Service: Cardiovascular;  Laterality: N/A;   TUBAL LIGATION      MEDICATIONS: Current Outpatient Medications on File Prior to Visit  Medication Sig Dispense Refill   albuterol  (VENTOLIN  HFA) 108 (90 Base) MCG/ACT inhaler Inhale 2 puffs into the lungs every 6 (six) hours as needed for wheezing or shortness of breath. 8 g 6   budesonide -formoterol  (SYMBICORT ) 160-4.5 MCG/ACT inhaler Inhale 2 puffs into the lungs 2 (two) times daily. 1 each 12   metoprolol  tartrate (LOPRESSOR ) 50 MG tablet Take 1 tablet (50 mg total) by mouth 2 (two) times daily. 180 tablet 3   predniSONE  (DELTASONE ) 20 MG tablet Take 2 tablets (40 mg total) by mouth daily  with breakfast for 5 days. 10 tablet 0   No current facility-administered medications on file prior to visit.    ALLERGIES: Allergies  Allergen Reactions   Anesthetics, Amide Other (See Comments)    seizures   Other Other (See Comments)    General Anesthesia caused seizures-Propofol     FAMILY HISTORY: Family History  Problem Relation Age of Onset   Heart disease Mother    Hypertension Mother    Heart Problems Mother    Hyperlipidemia Mother    Breast cancer Mother 78   Diabetes Father    Seizures Father    Heart disease Sister    Crohn's disease Sister    Crohn's disease Brother    Hypertension Maternal Aunt    Colon cancer Maternal Uncle    Hypertension Maternal Uncle    Prostate cancer  Maternal Uncle    Prostate cancer Maternal Uncle    Cancer Paternal Aunt        unknown type   Cancer Paternal Uncle        unknown type   Cancer Paternal Uncle        unknown type   Throat cancer Paternal Uncle    Hypertension Maternal Grandmother    Stroke Maternal Grandmother    Breast cancer Maternal Grandmother 41   Stroke Maternal Grandfather    Pancreatic cancer Cousin    Breast cancer Cousin        dx 53s, bilateral mastectomies (maternal first cousin)   Bone cancer Nephew 11       knee, great-nephew   Breast cancer Other        great-aunt (MGF's sister)   Breast cancer Other        first cousin once removed (MGF's niece)   Breast cancer Other        first cousin once removed (MGF's niece)   Cancer Other        unknown type, great-uncle (MGM's brother)   Breast cancer Other        first cousin once removed (MGM's niece)   Breast cancer Other        first cousin once removed (MGM's niece)   Prostate cancer Other        first cousin once removed (MGM's nephew)   Breast cancer Other        second cousin (MGM's great-niece)   Cancer Other        unknown type, dx 15s (maternal aunt's granddaughter)   Sickle cell anemia Other    Esophageal cancer Neg Hx    Stomach cancer Neg Hx    Rectal cancer Neg Hx     SOCIAL HISTORY: Social History   Socioeconomic History   Marital status: Married    Spouse name: Not on file   Number of children: 4   Years of education: Not on file   Highest education level: Associate degree: occupational, Scientist, product/process development, or vocational program  Occupational History   Occupation: Health and safety inspector: [employer]  Tobacco Use   Smoking status: Never   Smokeless tobacco: Never  Vaping Use   Vaping status: Never Used  Substance and Sexual Activity   Alcohol  use: No   Drug use: No   Sexual activity: Yes    Birth control/protection: Surgical  Other Topics Concern   Not on file  Social History Narrative   Are you right handed or  left handed? Right    Are you currently employed ? No  What is your current occupation?   Do you live at home alone?no   Who lives with you? Lives with son    What type of home do you live in: 1 story or 2 story? 1 story        Social Drivers of Corporate investment banker Strain: Medium Risk (11/16/2023)   Overall Financial Resource Strain (CARDIA)    Difficulty of Paying Living Expenses: Somewhat hard  Food Insecurity: No Food Insecurity (11/16/2023)   Hunger Vital Sign    Worried About Running Out of Food in the Last Year: Never true    Ran Out of Food in the Last Year: Never true  Transportation Needs: No Transportation Needs (11/16/2023)   PRAPARE - Administrator, Civil Service (Medical): No    Lack of Transportation (Non-Medical): No  Physical Activity: Unknown (11/16/2023)   Exercise Vital Sign    Days of Exercise per Week: 0 days    Minutes of Exercise per Session: Not on file  Stress: No Stress Concern Present (11/16/2023)   [employer] of Occupational Health - Occupational Stress Questionnaire    Feeling of Stress : Not at all  Social Connections: Socially Integrated (11/16/2023)   Social Connection and Isolation Panel    Frequency of Communication with Friends and Family: Twice a week    Frequency of Social Gatherings with Friends and Family: Twice a week    Attends Religious Services: 1 to 4 times per year    Active Member of [employer] or Organizations: No    Attends Engineer, structural: More than 4 times per year    Marital Status: Married  Catering manager Violence: Not At Risk (11/18/2023)   Humiliation, Afraid, Rape, and Kick questionnaire    Fear of Current or Ex-Partner: No    Emotionally Abused: No    Physically Abused: No    Sexually Abused: No     PHYSICAL EXAM: Vitals:   05/11/24 0847  BP: 121/82  Pulse: 82  SpO2: 100%   General: No acute distress Head:  Normocephalic/atraumatic Skin/Extremities: No rash, no edema Neurological  Exam: Mental status: alert and oriented to person, place, and time, no dysarthria or aphasia, Fund of knowledge is appropriate.  Recent and remote memory are intact.  Attention and concentration are normal.    Able to name objects and repeat phrases. MMSE 28/30    05/11/2024    9:00 AM 11/11/2021    2:27 PM  MMSE - Mini Mental State Exam  Orientation to time 4 5  Orientation to Place 5 5  Registration 3 3  Attention/ Calculation 5 4  Recall 2 3  Language- name 2 objects 2 2  Language- repeat 1 1  Language- follow 3 step command 3 3  Language- read & follow direction 1 1  Write a sentence 1 1  Copy design 1 1  Total score 28 29   Cranial nerves: CN I: not tested CN II: pupils equal, round, visual fields intact CN III, IV, VI:  full range of motion, no nystagmus, no ptosis CN V: facial sensation intact CN VII: upper and lower face symmetric CN VIII: hearing intact to conversation Bulk & Tone: normal, no fasciculations. Motor: 5/5 throughout except for right knee pain with hip flexion Sensation: intact to light touch, cold, pin, vibration sense.  No extinction to double simultaneous stimulation.  Romberg test negative Deep Tendon Reflexes: +2 on both UE and left patella (pain on right patella) Cerebellar: no  incoordination on finger to nose testing Gait: favors right leg, limping due to knee pain Tremor: none   IMPRESSION: This is a pleasant 53 year old right-handed woman with a history of breast cancer s/p lumpectomy and radiation, palpitations, presenting for evaluation of seizure-like activity. She reports memory loss, headaches, and blackouts since colonoscopy in 2022. 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 Overnight EEG normal, MRI brain no acute changes in 2022. She continues to report cognitive changes, MMSE today 28/30. We discussed different causes of memory loss, check TSH, B12, vitamin D. MRI  brain with and without contrast and 1-hour EEG will be ordered. She has a diagnosis of Somatic symptom disorder, we discussed different causes of seizures, including non-epileptic events. If routine EEG is normal, we will do a 48-hour EEG to further characterize symptoms. She does not drive. We discussed the importance of control of vascular risk factors, physical exercise, MIND diet for overall brain health. Follow-up after tests, call for any changes.    Thank you for allowing me to participate in the care of this patient. Please do not hesitate to call for any questions or concerns.   Darice Shivers, M.D.  CC: Rosaline Bohr, NP

## 2024-05-14 ENCOUNTER — Ambulatory Visit: Payer: Self-pay | Admitting: Neurology

## 2024-05-14 MED ORDER — VITAMIN D (ERGOCALCIFEROL) 1.25 MG (50000 UNIT) PO CAPS: ORAL_CAPSULE | ORAL | 0 refills | Status: DC

## 2024-05-17 ENCOUNTER — Encounter: Payer: Self-pay | Admitting: Pulmonary Disease

## 2024-05-17 ENCOUNTER — Encounter

## 2024-05-17 ENCOUNTER — Ambulatory Visit: Admitting: Pulmonary Disease

## 2024-05-17 VITALS — BP 108/72 | HR 81 | Ht 69.0 in | Wt 208.8 lb

## 2024-05-17 DIAGNOSIS — R0609 Other forms of dyspnea: Secondary | ICD-10-CM

## 2024-05-17 DIAGNOSIS — J4540 Moderate persistent asthma, uncomplicated: Secondary | ICD-10-CM

## 2024-05-17 LAB — PULMONARY FUNCTION TEST
DL/VA % pred: 135 %
DL/VA: 5.57 ml/min/mmHg/L
DLCO cor % pred: 78 %
DLCO cor: 19.07 ml/min/mmHg
DLCO unc % pred: 77 %
DLCO unc: 18.71 ml/min/mmHg
FEF 25-75 Post: 2.88 L/s
FEF 25-75 Pre: 1.25 L/s
FEF2575-%Change-Post: 129 %
FEF2575-%Pred-Post: 100 %
FEF2575-%Pred-Pre: 43 %
FEV1-%Change-Post: 70 %
FEV1-%Pred-Post: 60 %
FEV1-%Pred-Pre: 35 %
FEV1-Post: 1.92 L
FEV1-Pre: 1.13 L
FEV1FVC-%Change-Post: -4 %
FEV1FVC-%Pred-Pre: 108 %
FEV6-%Change-Post: 78 %
FEV6-%Pred-Post: 59 %
FEV6-%Pred-Pre: 33 %
FEV6-Post: 2.34 L
FEV6-Pre: 1.31 L
FEV6FVC-%Pred-Post: 103 %
FEV6FVC-%Pred-Pre: 103 %
FVC-%Change-Post: 78 %
FVC-%Pred-Post: 57 %
FVC-%Pred-Pre: 32 %
FVC-Post: 2.34 L
FVC-Pre: 1.31 L
Post FEV1/FVC ratio: 82 %
Post FEV6/FVC ratio: 100 %
Pre FEV1/FVC ratio: 86 %
Pre FEV6/FVC Ratio: 100 %
RV % pred: 186 %
RV: 3.99 L
TLC % pred: 104 %
TLC: 6.09 L

## 2024-05-17 MED ORDER — PREDNISONE 20 MG PO TABS: ORAL_TABLET | ORAL | 0 refills | Status: AC

## 2024-05-17 MED ORDER — SPIRIVA RESPIMAT 2.5 MCG/ACT IN AERS: 2.0000 | INHALATION_SPRAY | Freq: Every day | RESPIRATORY_TRACT | 11 refills | Status: AC

## 2024-05-17 NOTE — Progress Notes (Signed)
 Full pft performed today.

## 2024-05-17 NOTE — Patient Instructions (Signed)
 Your pulmonary function test are consistent with asthma  This can cause chest discomfort and shortness of breath and cough like you are experiencing  It sounds like the Symbicort  has not helped too much  Continue Symbicort  2 puffs twice a day and add Spiriva 2 puffs once a day in the morning  I prescribed prednisone , take as prescribed to help with congestion and chest tightness.  Hopefully this will help.  Return to clinic in 2 months or sooner as needed with Dr. Annella

## 2024-05-17 NOTE — Patient Instructions (Signed)
 Full pft performed today.

## 2024-05-17 NOTE — Progress Notes (Signed)
 @Patient  ID: Sheila [name], female    DOB: [birth year], 53 y.o.   MRN: [id number]  Chief Complaint  Patient presents with   Follow-up    Pt states SOB. 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 Pt states it has gotten worse over time.      Referring provider: Celestia Rosaline SQUIBB, NP  HPI:   53 y.o. woman whom are seen for evaluation of dyspnea on exertion.  ED note x 2 reviewed.  Most recent neurology note reviewed.  Returns for routine follow-up.  Concern for asthma given cough and shortness of breath.  With thickened bronchioles on prior cross-sectional imaging.  Prescribe Symbicort  high-dose.  Minimal improvement per report.  Stable chest discomfort tightness.  Shortness of breath with exertion.  Occasional cough productive of phlegm.  She had pulmonary function test performed today.  These are fully interpreted below but this shows incredible improvement after albuterol  as well as air trapping, consistent with small airways disease.  We discussed this at length.  Discussed increasing her regimen.  Briefly discussed biologic therapy.  HPI initial visit: DOE chief complaint.  Present for some time.  Worse on inclines or stairs.  No time of day when things are worse.  No position maintenance better or worse.  No seasonal environmental factors she can do vitamin things better or worse.  No clear alleviating or exacerbating factors.  Resting gets better.  Not much cough.  Had CT scan 09/2022 that showed thickened bronchioles otherwise clear.  Chest x-ray 09/2022 clear.  We discussed broad differential diagnosis for this.  We discussed workup for this including cardiac and pulmonary causes.   Questionaires / Pulmonary Flowsheets:   ACT:      No data to display          MMRC:     No data to display          Epworth:      No data to display          Tests:   FENO:  No results found for: NITRICOXIDE  PFT:    Latest  Ref Rng & Units 05/17/2024    2:39 PM  PFT Results  FVC-Pre L 1.31  P  FVC-Predicted Pre % 32  P  FVC-Post L 2.34  P  FVC-Predicted Post % 57  P  Pre FEV1/FVC % % 86  P  Post FEV1/FCV % % 82  P  FEV1-Pre L 1.13  P  FEV1-Predicted Pre % 35  P  FEV1-Post L 1.92  P  DLCO uncorrected ml/min/mmHg 18.71  P  DLCO UNC% % 77  P  DLCO corrected ml/min/mmHg 19.07  P  DLCO COR %Predicted % 78  P  DLVA Predicted % 135  P  TLC L 6.09  P  TLC % Predicted % 104  P  RV % Predicted % 186  P    P Preliminary result  Personally reviewed and from suggestive of severe restriction versus air trapping, significant improvement 700 to 800 cc improvement in FEV1 and FVC after bronchodilator, lung volumes consistent with air trapping, DLCO was mildly reduced.  WALK:      No data to display          Imaging:  Personally reviewed  Lab Results: Personally reviewed CBC    Component Value Date/Time   WBC 5.8 04/16/2024 1919   RBC 4.38 04/16/2024 1919   HGB 12.8 04/16/2024 1919   HGB  12.5 05/07/2022 1538   HGB 12.5 04/03/2022 1323   HCT 39.3 04/16/2024 1919   HCT 37.5 04/03/2022 1323   PLT 271 04/16/2024 1919   PLT 277 05/07/2022 1538   PLT 267 04/03/2022 1323   MCV 89.7 04/16/2024 1919   MCV 87 04/03/2022 1323   MCH 29.2 04/16/2024 1919   MCHC 32.6 04/16/2024 1919   RDW 12.8 04/16/2024 1919   RDW 12.9 04/03/2022 1323   LYMPHSABS 1.6 01/02/2024 2232   LYMPHSABS 1.1 04/03/2022 1323   MONOABS 0.4 01/02/2024 2232   EOSABS 0.0 01/02/2024 2232   EOSABS 0.1 04/03/2022 1323   BASOSABS 0.0 01/02/2024 2232   BASOSABS 0.0 04/03/2022 1323    BMET    Component Value Date/Time   NA 139 04/16/2024 1919   NA 143 06/30/2020 1553   K 4.0 04/16/2024 1919   CL 106 04/16/2024 1919   CO2 22 04/16/2024 1919   GLUCOSE 117 (H) 04/16/2024 1919   BUN 10 04/16/2024 1919   BUN 8 06/30/2020 1553   CREATININE 0.99 04/16/2024 1919   CREATININE 0.95 05/07/2022 1538   CALCIUM  9.1 04/16/2024 1919    GFRNONAA >60 04/16/2024 1919   GFRNONAA >60 05/07/2022 1538   GFRAA 82 06/30/2020 1553    BNP    Component Value Date/Time   BNP 26.0 04/16/2024 1919    ProBNP No results found for: PROBNP  Specialty Problems   None   Allergies  Allergen Reactions   Anesthetics, Amide Other (See Comments)    seizures   Other Other (See Comments)    General Anesthesia caused seizures-Propofol     Immunization History  Administered Date(s) Administered   Tdap 11/18/2023    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   Tiotropium Bromide Monohydrate (SPIRIVA RESPIMAT) 2.5 MCG/ACT AERS Inhale 2 puffs into the lungs daily.   albuterol  (VENTOLIN  HFA) 108 (90 Base) MCG/ACT inhaler Inhale 2 puffs into the lungs every 6 (six) hours as needed for wheezing or shortness of breath.   budesonide -formoterol  (SYMBICORT ) 160-4.5 MCG/ACT inhaler Inhale 2 puffs into the lungs 2 (two) times daily.   metoprolol  tartrate (LOPRESSOR ) 50 MG tablet Take 1 tablet (50 mg total) by mouth 2 (two) times daily.   Vitamin D, Ergocalciferol, (DRISDOL) 1.25 MG (50000 UNIT) CAPS capsule Take 1 capsule every 7 days (once a week) for 8 weeks.   No facility-administered encounter medications on file as of 05/17/2024.      Review of Systems  Review of Systems  N/a  Physical Exam  BP 108/72 (BP Location: Right Arm, Patient Position: Sitting, Cuff Size: Large)   Pulse 81   Ht 5' 9 (1.753 m)   Wt 208 lb 12.8 oz (  94.7 kg)   LMP 04/03/2018 (Within Days)   SpO2 100%   BMI 30.83 kg/m   Wt Readings from Last 5 Encounters:  05/17/24 208 lb 12.8 oz (94.7 kg)  05/11/24 208 lb 6.4 oz (94.5 kg)  04/16/24 210 lb (95.3 kg)  02/16/24 202 lb (91.6 kg)  02/14/24 207 lb (93.9 kg)    BMI Readings from Last 5 Encounters:  05/17/24 30.83 kg/m  05/11/24 30.78 kg/m  04/16/24 31.01 kg/m  02/16/24 29.83 kg/m  02/14/24 30.57 kg/m     Physical Exam General: Sitting in chair, no acute distress Eyes: EOMI, no icterus Neck: No JVD appreciated Pulmonary: Clear, no work of breathing Cardiovascular: Warm, no edema MSK: No synovitis Neuro: Normal gait, no weakness Psych: Normal mood, full affect   Assessment & Plan:   Dyspnea on exertion: Quite possibly multifactorial, concern for contribution from asthma given thickened bronchioles on CT scan 09/2022.  Lungs otherwise clear.  PFTs consistent with air trapping significant bronchodilator response, further increasing suspicion for asthma.  Echocardiogram ordered at last visit yet to be performed.  Asthma: Based on thickened bronchioles and symptoms of dyspnea on exertion.  On Symbicort  high-dose.  Minimal improvement.  Add Spiriva.  Continue albuterol  as needed.  Consider addition of biologic therapies in the future, will need phenotyping.   Return in about 2 months (around 07/18/2024) for f/u Dr. Annella.   Donnice JONELLE Annella, MD 05/17/2024

## 2024-05-21 ENCOUNTER — Other Ambulatory Visit

## 2024-05-21 ENCOUNTER — Other Ambulatory Visit: Payer: Self-pay | Admitting: Primary Care

## 2024-05-21 DIAGNOSIS — Z1231 Encounter for screening mammogram for malignant neoplasm of breast: Secondary | ICD-10-CM

## 2024-05-22 NOTE — Telephone Encounter (Signed)
-----   Message from Darice CHRISTELLA Shivers sent at 05/14/2024 12:01 PM EDT ----- Pls let her know the vitamin D  level is very low, I sent in a prescription for high dose vitamin D  replacement, take 1 capsule every 7 days (once a week) for 8 weeks. Thanks ----- Message ----- From: Rebecka Hose Lab Results In Sent: 05/11/2024  11:45 PM EDT To: Darice CHRISTELLA Shivers, MD

## 2024-05-22 NOTE — Telephone Encounter (Signed)
 Pt called informed that vitamin D  level is very low,Dr Georjean sent in a prescription for high dose vitamin D  replacement, take 1 capsule every 7 days (once a week) for 8 weeks

## 2024-05-28 ENCOUNTER — Encounter: Payer: Self-pay | Admitting: Neurology

## 2024-05-28 ENCOUNTER — Ambulatory Visit (INDEPENDENT_AMBULATORY_CARE_PROVIDER_SITE_OTHER): Admitting: Neurology

## 2024-05-28 DIAGNOSIS — R413 Other amnesia: Secondary | ICD-10-CM | POA: Diagnosis not present

## 2024-05-28 DIAGNOSIS — R519 Headache, unspecified: Secondary | ICD-10-CM

## 2024-05-28 DIAGNOSIS — R4020 Unspecified coma: Secondary | ICD-10-CM

## 2024-05-28 NOTE — Progress Notes (Unsigned)
 EEG complete and ready for review.

## 2024-05-30 NOTE — Procedures (Signed)
 ELECTROENCEPHALOGRAM REPORT  Date of Study: 05/28/2024  Patient's Name: Sheila [name] MRN: [id number] Date of Birth: 06/12/1971  Referring Provider: Dr. Darice Shivers  Clinical History: This is a 53 year old woman with recurrent episodes of loss of consciousness, memory loss. EEG for classification.  Medications: Metoprolol   Technical Summary: A multichannel digital 1-hour EEG recording measured by the international 10-20 system with electrodes applied with paste and impedances below 5000 ohms performed in our laboratory with EKG monitoring in an awake and asleep patient.  Hyperventilation was not performed. Photic stimulation was performed.  The digital EEG was referentially recorded, reformatted, and digitally filtered in a variety of bipolar and referential montages for optimal display.    Description: The patient is awake and asleep during the recording.  During maximal wakefulness, there is a symmetric, medium voltage 11-12 Hz posterior dominant rhythm that attenuates with eye opening.  The record is symmetric.  During drowsiness and sleep, there is an increase in theta slowing of the background.  Vertex waves and symmetric sleep spindles were seen. Photic stimulation did not elicit any abnormalities.  There were no epileptiform discharges or electrographic seizures seen.    EKG lead was unremarkable.  Impression: This 1-hour awake and asleep EEG is normal.    Clinical Correlation: A normal EEG does not exclude a clinical diagnosis of epilepsy.  If further clinical questions remain, prolonged EEG may be helpful.  Clinical correlation is advised.   Darice Shivers, M.D.

## 2024-05-31 ENCOUNTER — Ambulatory Visit (HOSPITAL_COMMUNITY)
Admission: RE | Admit: 2024-05-31 | Discharge: 2024-05-31 | Disposition: A | Discharge status: Home / Self Care | Source: Ambulatory Visit | Attending: Pulmonary Disease | Admitting: Pulmonary Disease

## 2024-05-31 DIAGNOSIS — R0609 Other forms of dyspnea: Secondary | ICD-10-CM | POA: Insufficient documentation

## 2024-05-31 DIAGNOSIS — R079 Chest pain, unspecified: Secondary | ICD-10-CM | POA: Insufficient documentation

## 2024-05-31 DIAGNOSIS — I272 Pulmonary hypertension, unspecified: Secondary | ICD-10-CM | POA: Insufficient documentation

## 2024-05-31 LAB — ECHOCARDIOGRAM COMPLETE
Area-P 1/2: 3.48 cm2
S' Lateral: 2.5 cm

## 2024-05-31 NOTE — Progress Notes (Signed)
  Echocardiogram 2D Echocardiogram has been performed.  LAMON MAXWELL 05/31/2024, 2:52 PM

## 2024-06-01 ENCOUNTER — Other Ambulatory Visit: Payer: Self-pay | Admitting: *Deleted

## 2024-06-01 DIAGNOSIS — R4020 Unspecified coma: Secondary | ICD-10-CM

## 2024-06-02 ENCOUNTER — Emergency Department (HOSPITAL_COMMUNITY)
Admission: EM | Admit: 2024-06-02 | Discharge: 2024-06-02 | Disposition: A | Discharge status: Home / Self Care | Attending: Emergency Medicine | Admitting: Emergency Medicine

## 2024-06-02 ENCOUNTER — Other Ambulatory Visit: Payer: Self-pay

## 2024-06-02 ENCOUNTER — Emergency Department (HOSPITAL_COMMUNITY)

## 2024-06-02 ENCOUNTER — Encounter (HOSPITAL_COMMUNITY): Payer: Self-pay | Admitting: *Deleted

## 2024-06-02 DIAGNOSIS — M7989 Other specified soft tissue disorders: Secondary | ICD-10-CM | POA: Diagnosis not present

## 2024-06-02 DIAGNOSIS — M79675 Pain in left toe(s): Secondary | ICD-10-CM | POA: Diagnosis present

## 2024-06-02 DIAGNOSIS — Z853 Personal history of malignant neoplasm of breast: Secondary | ICD-10-CM | POA: Insufficient documentation

## 2024-06-02 MED ORDER — HYDROCODONE-ACETAMINOPHEN 5-325 MG PO TABS
1.0000 | ORAL_TABLET | Freq: Once | ORAL | Status: AC
  Administered 2024-06-02: 1 via ORAL
  Filled 2024-06-02: qty 1

## 2024-06-02 MED ORDER — COLCHICINE 0.6 MG PO TABS: 0.6000 mg | ORAL_TABLET | Freq: Every day | ORAL | 0 refills | Status: DC

## 2024-06-02 MED ORDER — ACETAMINOPHEN 325 MG PO TABS
650.0000 mg | ORAL_TABLET | Freq: Once | ORAL | Status: AC
  Administered 2024-06-02: 650 mg via ORAL
  Filled 2024-06-02: qty 2

## 2024-06-02 MED ORDER — HYDROCODONE-ACETAMINOPHEN 5-325 MG PO TABS: 1.0000 | ORAL_TABLET | Freq: Four times a day (QID) | ORAL | 0 refills | Status: DC | PRN

## 2024-06-02 NOTE — ED Triage Notes (Signed)
 The pt has 2 complaints  the lt arm is swollen and her veins in that arm are bulging for 2 days  she has had breast cancer on that side in the past  no reaction before  she also thinks that she hit something with her lt great toe last pm at church the toe is painful and sl swollen

## 2024-06-02 NOTE — ED Provider Notes (Signed)
 [place] EMERGENCY DEPARTMENT AT [facility] Provider Note   CSN: [id number] Arrival date & time: 06/02/24  8190     Patient presents with: Toe Pain   Sheila [name] is a 53 y.o. female.  With complaint of obesity, anemia, personal history of malignant neoplasm of breast presents to emergency room with 2 complaints.  She notes that she has had several days of left arm swelling and feels her veins are distended.  She notes no significant discomfort fever or warmth.  She does not think that she had any injury.  Reports she has not had any similar symptoms before but has had lymph nodes taken out on her left extremity due to her breast cancer.  She also notes 1 day of left great toe swelling.  She thinks she possibly hit her toe or stubbed her toe at church last night but she notes that in the middle of night she started having severe onset painful metatarsal with swelling and redness.  She is not had similar symptoms like this before. No fever.  She is ambulatory with steady gait.    Toe Pain       Prior to Admission medications   Medication Sig Start Date End Date Taking? Authorizing Provider  albuterol  (VENTOLIN  HFA) 108 (90 Base) MCG/ACT inhaler Inhale 2 puffs into the lungs every 6 (six) hours as needed for wheezing or shortness of breath. 02/14/24   Hunsucker, Donnice SAUNDERS, MD  budesonide -formoterol  (SYMBICORT ) 160-4.5 MCG/ACT inhaler Inhale 2 puffs into the lungs 2 (two) times daily. 02/14/24   Hunsucker, Donnice SAUNDERS, MD  metoprolol  tartrate (LOPRESSOR ) 50 MG tablet Take 1 tablet (50 mg total) by mouth 2 (two) times daily. 11/10/23 05/11/24  Pietro Redell RAMAN, MD  Tiotropium Bromide Monohydrate  (SPIRIVA  RESPIMAT) 2.5 MCG/ACT AERS Inhale 2 puffs into the lungs daily. 05/17/24   Hunsucker, Donnice SAUNDERS, MD  Vitamin D , Ergocalciferol , (DRISDOL ) 1.25 MG (50000 UNIT) CAPS capsule Take 1 capsule every 7 days (once a week) for 8 weeks. 05/14/24   Georjean Darice HERO, MD    Allergies:  Anesthetics, amide and Other    Review of Systems  Musculoskeletal:  Positive for arthralgias.    Updated Vital Signs BP 117/83 (BP Location: Right Arm)   Pulse 79   Temp 98.3 F (36.8 C)   Resp 16   Ht 5' 9 (1.753 m)   Wt 94.7 kg   LMP 04/03/2018 (Within Days)   SpO2 96%   BMI 30.83 kg/m   Physical Exam Vitals and nursing note reviewed.  Constitutional:      General: She is not in acute distress.    Appearance: She is not toxic-appearing.  HENT:     Head: Normocephalic and atraumatic.  Eyes:     General: No scleral icterus.    Conjunctiva/sclera: Conjunctivae normal.  Cardiovascular:     Rate and Rhythm: Normal rate and regular rhythm.     Pulses: Normal pulses.     Heart sounds: Normal heart sounds.  Pulmonary:     Effort: Pulmonary effort is normal. No respiratory distress.     Breath sounds: Normal breath sounds.  Abdominal:     General: Abdomen is flat. Bowel sounds are normal.     Palpations: Abdomen is soft.     Tenderness: There is no abdominal tenderness.  Musculoskeletal:     Right lower leg: No edema.     Left lower leg: No edema.     Comments: Left upper extremity swelling.  Strong  radial pulse.  Good grip strength.  No erythema suggesting cellulitis.  Left great toe painful and swelling at 1st MTP.  Dorsal pedal pulse.  No erythema to suggest cellulitis.  Skin:    General: Skin is warm and dry.     Findings: No lesion.  Neurological:     General: No focal deficit present.     Mental Status: She is alert and oriented to person, place, and time. Mental status is at baseline.     (all labs ordered are listed, but only abnormal results are displayed) Labs Reviewed - No data to display  EKG: None  Radiology: DG Foot Complete Left Result Date: 06/02/2024 CLINICAL DATA:  Left great toe painful and swollen. Uncertain of injury. EXAM: LEFT FOOT - COMPLETE 3+ VIEW COMPARISON:  None Available. FINDINGS: No fracture or bone lesion. Joints are normally  spaced. No significant degenerative/arthropathic change. Hallux valgus. Bony prominence from the medial first metatarsal head consistent with a bunion. Small plantar and dorsal calcaneal spurs. Normal soft tissues. IMPRESSION: 1. No fracture or acute finding. 2. Medial bony prominence/bunion from the first metatarsal head. Hallux valgus. Electronically Signed   By: Alm Parkins M.D.   On: 06/02/2024 19:36     Procedures   Medications Ordered in the ED  acetaminophen  (TYLENOL ) tablet 650 mg (650 mg Oral Given 06/02/24 1847)  HYDROcodone -acetaminophen  (NORCO/VICODIN) 5-325 MG per tablet 1 tablet (1 tablet Oral Given 06/02/24 2109)                                    Medical Decision Making Amount and/or Complexity of Data Reviewed Radiology: ordered.  Risk OTC drugs. Prescription drug management.   This patient presents to the ED for concern of left arm swelling, left toe pain, this involves an extensive number of treatment options, and is a complaint that carries with it a high risk of complications and morbidity.  The differential diagnosis includes DVT, cellulitis, gout, septic joint, fracture, strain   Imaging Studies ordered:  I ordered imaging studies including left toe x-ray I independently visualized and interpreted imaging which showed hallux valgus, no acute fracture or acute finding. I agree with the radiologist interpretation Unfortunately DVT studies are not available this patient will follow-up for left upper extremity DVT study study outpatient --denies chest pain shortness of breath associated with her symptoms.    Problem List / ED Course / Critical interventions / Medication management  Patient notes left upper extremity swelling.  There is no pitting edema.  No erythema.  Neurovascularly intact.  She has no history of DVT or PE.  She has no sign of cellulitis.  No associated chest pain or shortness of breath.  I personally cannot obtain DVT study here to rule out  blood clot as cause so she will get this done outpatient. Also has left great toe pain x-ray shows no acute findings.  Questioning if this is gout given clinical history, but she does note possibly stubbing her toe.  Will trial colchicine .  Symptoms seem less consistent with septic joint or fracture.  No overlying cellulitis.  Will give colchicine  and symptom management.  She will follow-up with primary care.    Plan F/u w/ PCP in 2-3d to ensure resolution of sx.  Patient was given return precautions. Patient stable for discharge at this time.  Patient educated on sx/dx and verbalized understanding of plan. Return to ER w/ new or  worsening sx.       Final diagnoses:  Great toe pain, left  Left arm swelling    ED Discharge Orders          Ordered    UE Venous Duplex       Comments: IMPORTANT PATIENT INSTRUCTIONS: You have been scheduled for an Outpatient Vascular Study at [facility].  If tomorrow is a Saturday, Sunday or holiday, please go to the [place] [facility] Registration Desk at 11 am tomorrow morning and tell them you are there for a vascular study.  If tomorrow is a weekday (Monday-Friday), please go to the [facility] (address [address], [place]) at 8 am and report to the 4th floor registration Zone A.  Inform registration that you are there for a vascular study.   06/02/24 2132    colchicine 0.6 MG tablet  Daily        06/02/24 2134    HYDROcodone-acetaminophen (NORCO/VICODIN) 5-325 MG tablet  Every 6 hours PRN        07 /19/25 2134               Larkin Morelos, Warren SAILOR, [name] 06/02/24 2300    Randol Simmonds, MD 06/03/24 1536

## 2024-06-02 NOTE — Discharge Instructions (Signed)
 You were seen here today for left toe pain.  I am suspicious that this is gout.  Take colchicine  --you will take 2 pills for total of 1.2 mg, then take 1 pill an hour later.  Take Tylenol  for pain control, take hydrocodone  for breakthrough pain.  Please call to have an upper extremity DVT study of your left arm.  In the meantime try warm compress.  Follow-up with primary care and return to ER with new or worsening symptoms.

## 2024-06-02 NOTE — ED Triage Notes (Signed)
 The pt has not taken anything for pain

## 2024-06-02 NOTE — ED Notes (Signed)
 Patient transported to X-ray

## 2024-06-03 ENCOUNTER — Ambulatory Visit (HOSPITAL_COMMUNITY): Admission: RE | Admit: 2024-06-03 | Source: Ambulatory Visit

## 2024-06-04 ENCOUNTER — Ambulatory Visit: Payer: Self-pay

## 2024-06-05 ENCOUNTER — Ambulatory Visit (HOSPITAL_COMMUNITY)
Admission: RE | Admit: 2024-06-05 | Discharge: 2024-06-05 | Disposition: A | Discharge status: Home / Self Care | Source: Ambulatory Visit | Attending: Vascular Surgery | Admitting: Vascular Surgery

## 2024-06-05 DIAGNOSIS — M7989 Other specified soft tissue disorders: Secondary | ICD-10-CM | POA: Insufficient documentation

## 2024-06-06 ENCOUNTER — Telehealth: Payer: Self-pay

## 2024-06-06 NOTE — Telephone Encounter (Signed)
 MRI Brain approved

## 2024-06-12 ENCOUNTER — Telehealth: Payer: Self-pay | Admitting: *Deleted

## 2024-06-12 NOTE — Telephone Encounter (Signed)
 Brain MRI (w/ w/o) PA-approved 05/28/24-06/27/24.-[employer]

## 2024-06-13 ENCOUNTER — Inpatient Hospital Stay: Admission: RE | Admit: 2024-06-13 | Source: Ambulatory Visit

## 2024-06-21 ENCOUNTER — Ambulatory Visit
Admission: RE | Admit: 2024-06-21 | Discharge: 2024-06-21 | Disposition: A | Discharge status: Home / Self Care | Source: Ambulatory Visit | Attending: Neurology | Admitting: Neurology

## 2024-06-21 DIAGNOSIS — R519 Headache, unspecified: Secondary | ICD-10-CM

## 2024-06-21 DIAGNOSIS — R413 Other amnesia: Secondary | ICD-10-CM

## 2024-06-21 DIAGNOSIS — R4020 Unspecified coma: Secondary | ICD-10-CM

## 2024-06-21 MED ORDER — GADOPICLENOL 0.5 MMOL/ML IV SOLN
9.4000 mL | Freq: Once | INTRAVENOUS | Status: AC | PRN
  Administered 2024-06-21: 9.4 mL via INTRAVENOUS

## 2024-06-25 ENCOUNTER — Ambulatory Visit
Admission: RE | Admit: 2024-06-25 | Discharge: 2024-06-25 | Disposition: A | Discharge status: Home / Self Care | Source: Ambulatory Visit | Attending: Primary Care | Admitting: Primary Care

## 2024-06-25 DIAGNOSIS — Z1231 Encounter for screening mammogram for malignant neoplasm of breast: Secondary | ICD-10-CM

## 2024-06-29 ENCOUNTER — Encounter (INDEPENDENT_AMBULATORY_CARE_PROVIDER_SITE_OTHER): Payer: Self-pay | Admitting: Primary Care

## 2024-07-03 ENCOUNTER — Other Ambulatory Visit

## 2024-07-12 NOTE — Progress Notes (Signed)
 I advised patient of MRI, voiced understanding and thanked me for calling on report.

## 2024-07-19 ENCOUNTER — Other Ambulatory Visit: Payer: Self-pay | Admitting: Neurology

## 2024-07-20 ENCOUNTER — Encounter (HOSPITAL_COMMUNITY): Payer: Self-pay

## 2024-07-20 ENCOUNTER — Ambulatory Visit (HOSPITAL_COMMUNITY)
Admission: EM | Admit: 2024-07-20 | Discharge: 2024-07-20 | Disposition: A | Discharge status: Home / Self Care | Attending: Physician Assistant | Admitting: Physician Assistant

## 2024-07-20 DIAGNOSIS — M25561 Pain in right knee: Secondary | ICD-10-CM

## 2024-07-20 MED ORDER — PREDNISONE 20 MG PO TABS: 40.0000 mg | ORAL_TABLET | Freq: Every day | ORAL | 0 refills | Status: AC

## 2024-07-20 NOTE — Discharge Instructions (Signed)
 Can wear knee brace for comfort. Take prednisone  as prescribed. Can take Tylenol  as needed.  While taking prednisone  please avoid other NSAIDs like ibuprofen  or Aleve . Follow-up with orthopedics.

## 2024-07-20 NOTE — ED Triage Notes (Signed)
 Bilateral knee pain but significant in the right knee onset 1 month ago. Patient states diagnosed with a cyst of the right knee and currently waiting for referral to ortho.   No recent falls or injuries.

## 2024-07-24 ENCOUNTER — Ambulatory Visit: Admitting: Neurology

## 2024-07-24 DIAGNOSIS — R4020 Unspecified coma: Secondary | ICD-10-CM

## 2024-07-24 NOTE — ED Provider Notes (Signed)
 [facility]    CSN: [id number] Arrival date & time: 07/20/24  1736      History   Chief Complaint Chief Complaint  Patient presents with   Knee Pain    HPI Sheila [name] is a 53 y.o. female.   Patient presents with bilateral knee pain, right greater than left, that started about 1 month ago.  She reports she has been waiting for orthopedic referral.  He states the pain is much worse.  Denies new injuries or falls.  Nothing seems to make symptoms better.  Pain with ambulation.    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    Patient Active Problem List   Diagnosis Date Noted   Hepatitis B immune 08/09/2023   Seizure-like activity (HCC) 08/09/2021   Postmenopausal bleeding 06/02/2021   Genetic testing 04/21/2021   Family history of breast cancer    Family history of bone cancer    Family history of prostate cancer    Family history of throat cancer    Malignant neoplasm of upper-outer quadrant of left breast in female, 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   Women's annual routine gynecological examination 02/25/2021   Screening examination for STD (sexually transmitted disease) 02/25/2021   Skin lesion 02/25/2021   Chest pain at rest 05/30/2014   Ejection fraction    Abnormal EKG 04/24/2014   Chest discomfort 04/23/2014   Panic attack 04/23/2014   OBESITY 01/15/2009   HAIR LOSS 01/15/2009   ANEMIA-NOS 01/14/2009    Past Surgical History:  Procedure Laterality Date   AXILLARY LYMPH NODE BIOPSY Left 04/29/2021   Procedure: AXILLARY LYMPH NODE BIOPSY;  Surgeon: Ebbie Cough, MD;  Location: [facility] OR;  Service: General;  Laterality: Left;    BREAST LUMPECTOMY WITH RADIOACTIVE SEED AND SENTINEL LYMPH NODE BIOPSY Left 04/29/2021   Procedure: LEFT BREAST LUMPECTOMY WITH RADIOACTIVE SEED;  Surgeon: Ebbie Cough, MD;  Location: [facility] OR;  Service: General;  Laterality: Left;   CARDIAC CATHETERIZATION     LEFT HEART CATHETERIZATION WITH CORONARY ANGIOGRAM N/A 05/30/2014   Procedure: LEFT HEART CATHETERIZATION WITH CORONARY ANGIOGRAM;  Surgeon: Peter M Swaziland, MD;  Location: [facility] CATH LAB;  Service: Cardiovascular;  Laterality: N/A;   TUBAL LIGATION      OB History     Gravida  8   Para  6   Term  4   Preterm  2   AB  2   Living  4      SAB  2   IAB      Ectopic      Multiple      Live Births               Home Medications    Prior to Admission medications   Medication Sig Start Date End Date Taking? Authorizing Provider  albuterol  (VENTOLIN  HFA) 108 (90 Base) MCG/ACT inhaler Inhale 2 puffs into the lungs every 6 (six) hours as needed for wheezing or shortness of breath. 02/14/24  Yes Hunsucker, Cough SAUNDERS, MD  budesonide -formoterol  (SYMBICORT ) 160-4.5 MCG/ACT inhaler Inhale 2 puffs into the lungs 2 (two) times daily. 02/14/24  Yes Hunsucker, Donnice SAUNDERS, MD  HYDROcodone -acetaminophen  (NORCO/VICODIN) 5-325 MG tablet Take 1 tablet by mouth every 6 (six) hours as needed for severe pain (pain score 7-10). 06/02/24  Yes Barrett, Jamie N, [name]  metoprolol  tartrate (LOPRESSOR ) 50 MG tablet Take 1 tablet (50 mg total) by mouth 2 (two) times daily. 11/10/23 07/20/24 Yes Pietro Redell RAMAN, MD  predniSONE  (DELTASONE ) 20 MG tablet Take 2 tablets (40 mg total) by mouth daily for 5 days. 07/20/24 07/25/24 Yes Ward, Harlene PEDLAR, [name]  Tiotropium Bromide Monohydrate  (SPIRIVA  RESPIMAT) 2.5 MCG/ACT AERS Inhale 2 puffs into the lungs daily. 05/17/24  Yes Hunsucker, Donnice SAUNDERS, MD  Vitamin D , Ergocalciferol , (DRISDOL ) 1.25 MG (50000 UNIT) CAPS capsule TAKE 1 CAPSULE BY MOUTH EVERY 7 DAYS FOR 8 WEEKS 07/19/24  Yes Georjean Darice HERO, MD  colchicine   0.6 MG tablet Take 1 tablet (0.6 mg total) by mouth daily for 3 days. 06/02/24 06/05/24  Barrett, Warren SAILOR, [name]    Family History Family History  Problem Relation Age of Onset   Heart disease Mother    Hypertension Mother    Heart Problems Mother    Hyperlipidemia Mother    Breast cancer Mother 77   Diabetes Father    Seizures Father    Heart disease Sister    Crohn's disease Sister    Crohn's disease Brother    Hypertension Maternal Aunt    Colon cancer Maternal Uncle    Hypertension Maternal Uncle    Prostate cancer Maternal Uncle    Prostate cancer Maternal Uncle    Cancer Paternal Aunt        unknown type   Cancer Paternal Uncle        unknown type   Cancer Paternal Uncle        unknown type   Throat cancer Paternal Uncle    Hypertension Maternal Grandmother    Stroke Maternal Grandmother    Breast cancer Maternal Grandmother 69   Stroke Maternal Grandfather    Pancreatic cancer Cousin    Breast cancer Cousin        dx 52s, bilateral mastectomies (maternal first cousin)   Bone cancer Nephew 11       knee, great-nephew   Breast cancer Other        great-aunt (MGF's sister)   Breast cancer Other        first cousin once removed (MGF's niece)   Breast cancer Other        first cousin once removed (MGF's niece)   Cancer Other        unknown type, great-uncle (MGM's brother)   Breast cancer Other        first cousin once removed (MGM's niece)   Breast cancer Other        first cousin once removed (MGM's niece)   Prostate cancer Other        first cousin once removed (MGM's nephew)   Breast cancer Other        second cousin (MGM's great-niece)   Cancer Other        unknown type, dx 44s (maternal aunt's granddaughter)   Sickle cell anemia Other    Esophageal cancer Neg Hx    Stomach cancer Neg Hx    Rectal cancer Neg Hx     Social History Social History   Tobacco Use   Smoking status: Never   Smokeless tobacco: Never  Vaping Use   Vaping status: Never  Used  Substance Use Topics   Alcohol  use:  No   Drug use: No     Allergies   Anesthetics, amide; Other; and Propofol    Review of Systems Review of Systems  Constitutional:  Negative for chills and fever.  HENT:  Negative for ear pain and sore throat.   Eyes:  Negative for pain and visual disturbance.  Respiratory:  Negative for cough and shortness of breath.   Cardiovascular:  Negative for chest pain and palpitations.  Gastrointestinal:  Negative for abdominal pain and vomiting.  Genitourinary:  Negative for dysuria and hematuria.  Musculoskeletal:  Positive for arthralgias (bilateral knee pain). Negative for back pain.  Skin:  Negative for color change and rash.  Neurological:  Negative for seizures and syncope.  All other systems reviewed and are negative.    Physical Exam Triage Vital Signs ED Triage Vitals  Encounter Vitals Group     BP 07/20/24 1846 134/87     Girls Systolic BP Percentile --      Girls Diastolic BP Percentile --      Boys Systolic BP Percentile --      Boys Diastolic BP Percentile --      Pulse Rate 07/20/24 1846 73     Resp 07/20/24 1846 18     Temp 07/20/24 1846 98.8 F (37.1 C)     Temp Source 07/20/24 1846 Oral     SpO2 07/20/24 1846 98 %     Weight 07/20/24 1846 206 lb (93.4 kg)     Height 07/20/24 1846 5' 9 (1.753 m)     Head Circumference --      Peak Flow --      Pain Score 07/20/24 1844 10     Pain Loc --      Pain Education --      Exclude from Growth Chart --    No data found.  Updated Vital Signs BP 134/87 (BP Location: Right Arm)   Pulse 73   Temp 98.8 F (37.1 C) (Oral)   Resp 18   Ht 5' 9 (1.753 m)   Wt 206 lb (93.4 kg)   LMP 04/03/2018 (Within Days)   SpO2 98%   BMI 30.42 kg/m   Visual Acuity Right Eye Distance:   Left Eye Distance:   Bilateral Distance:    Right Eye Near:   Left Eye Near:    Bilateral Near:     Physical Exam Vitals and nursing note reviewed.  Constitutional:      General: She is not  in acute distress.    Appearance: She is well-developed.  HENT:     Head: Normocephalic and atraumatic.  Eyes:     Conjunctiva/sclera: Conjunctivae normal.  Cardiovascular:     Rate and Rhythm: Normal rate and regular rhythm.     Heart sounds: No murmur heard. Pulmonary:     Effort: Pulmonary effort is normal. No respiratory distress.     Breath sounds: Normal breath sounds.  Abdominal:     Palpations: Abdomen is soft.     Tenderness: There is no abdominal tenderness.  Musculoskeletal:        General: No swelling.     Cervical back: Neck supple.     Right knee: Tenderness present over the medial joint line and lateral joint line.     Comments: No swelling to the right knee, normal ROM, normal strength   Skin:    General: Skin is warm and dry.     Capillary Refill: Capillary refill takes less than 2 seconds.  Neurological:  Mental Status: She is alert.  Psychiatric:        Mood and Affect: Mood normal.      UC Treatments / Results  Labs (all labs ordered are listed, but only abnormal results are displayed) Labs Reviewed - No data to display  EKG   Radiology No results found.  Procedures Procedures (including critical care time)  Medications Ordered in UC Medications - No data to display  Initial Impression / Assessment and Plan / UC Course  I have reviewed the triage vital signs and the nursing notes.  Pertinent labs & imaging results that were available during my care of the patient were reviewed by me and considered in my medical decision making (see chart for details).     Right knee pain.  No recent acute trauma.  Brace given in clinic today.  Prednisone  course started.  Advised Tylenol  as needed.  Recommend follow-up with orthopedics. Final Clinical Impressions(s) / UC Diagnoses   Final diagnoses:  Acute pain of right knee     Discharge Instructions      Can wear knee brace for comfort. Take prednisone  as prescribed. Can take Tylenol  as  needed.  While taking prednisone  please avoid other NSAIDs like ibuprofen  or Aleve . Follow-up with orthopedics.   ED Prescriptions     Medication Sig Dispense Auth. Provider   predniSONE  (DELTASONE ) 20 MG tablet Take 2 tablets (40 mg total) by mouth daily for 5 days. 10 tablet Ward, Filippo Puls Z, [name]      PDMP not reviewed this encounter.   Ward, Harlene PEDLAR, [name] 07/24/24 1650

## 2024-07-24 NOTE — Progress Notes (Signed)
 Ambulatory EEG hooked up and running. Light flashing. Push button tested. Camera and event log explained. Batteries explained. Patient understood.

## 2024-07-26 NOTE — Progress Notes (Signed)
 AMB EEG discontinued.  Skin Breakdown:No Diary Returned: No and no events noted by patient

## 2024-07-30 NOTE — Procedures (Signed)
 ELECTROENCEPHALOGRAM REPORT  Dates of Recording: 07/24/2024 1:44PM to 07/26/2024 11:51AM  Patient's Name: Sheila [name] MRN: [id number] Date of Birth: 04-28-1971  Referring Provider: Dr. Darice Shivers  Procedure: 46-hour ambulatory video EEG  History: This is a 53 year old woman with recurrent episodes of loss of consciousness, memory loss. EEG for classification.   Medications: Metoprolol   Technical Summary: This is a 46-hour multichannel digital video EEG recording measured by the international 10-20 system with electrodes applied with paste and impedances below 5000 ohms performed as portable with EKG monitoring.  The digital EEG was referentially recorded, reformatted, and digitally filtered in a variety of bipolar and referential montages for optimal display.    DESCRIPTION OF RECORDING: During maximal wakefulness, the background activity consisted of a symmetric 9 Hz posterior dominant rhythm which was reactive to eye opening.  There were no epileptiform discharges or focal slowing seen in wakefulness.  During the recording, the patient progresses through wakefulness, drowsiness, and Stage 2 sleep.  Again, there were no epileptiform discharges seen.  Events: There were 8 push button events that appear accidental. Patient did not fill out diary. No video recorded during push button events. Electrographically, there were no EEG or EKG changes seen.  There were no electrographic seizures seen.  EKG lead was unremarkable.  IMPRESSION: This 46-hour ambulatory video EEG study is normal.    CLINICAL CORRELATION: A normal EEG does not exclude a clinical diagnosis of epilepsy. Typical events were not captured or reported.  If further clinical questions remain, inpatient video EEG monitoring may be helpful.   Darice Shivers, M.D.

## 2024-08-13 ENCOUNTER — Encounter: Payer: Self-pay | Admitting: Neurology

## 2024-08-13 ENCOUNTER — Ambulatory Visit: Admitting: Neurology

## 2024-08-13 ENCOUNTER — Other Ambulatory Visit

## 2024-08-13 VITALS — BP 116/78 | HR 88 | Ht 69.0 in | Wt 206.0 lb

## 2024-08-13 DIAGNOSIS — R413 Other amnesia: Secondary | ICD-10-CM

## 2024-08-13 DIAGNOSIS — E559 Vitamin D deficiency, unspecified: Secondary | ICD-10-CM | POA: Diagnosis not present

## 2024-08-13 DIAGNOSIS — R4020 Unspecified coma: Secondary | ICD-10-CM

## 2024-08-13 DIAGNOSIS — R519 Headache, unspecified: Secondary | ICD-10-CM | POA: Diagnosis not present

## 2024-08-13 MED ORDER — AMITRIPTYLINE HCL 25 MG PO TABS: ORAL_TABLET | ORAL | 11 refills | Status: DC

## 2024-08-13 NOTE — Progress Notes (Signed)
 NEUROLOGY FOLLOW UP OFFICE NOTE  Sheila [name] [id number] 1971/05/09  HISTORY OF PRESENT ILLNESS: I had the pleasure of seeing Sheila [name] in follow-up in the neurology clinic on 08/13/2024.  The patient was last seen 3 months ago for memory loss, headaches, blackouts. She is alone in the office today. Records and images were personally reviewed where available. TSH and B12 were normal, vitamin D  level was very low 13. She was prescribed high dose replacement therapy. I personally reviewed brain MRI with and without contrast 06/2024 which did not show any acute changes. 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 Her 1-hour EEG in 05/2024 was normal, 48-hour ambulatory EEG in 07/2024 was normal, typical events not reported.   She reports episodes where she feels like she will black out, she lays down and goes to sleep. Her daughter tells her she is asleep for 30 minutes. She had a heart monitor for 3 days in 2024 noting symptoms correlate with infrequent ventricular ectopy. She continues to see Cardiology and does not want to repeat the heart monitor, stating she is taking her heart medication. She has headaches all the time and currently feels like her head is starting to clog up. Vision is blurred. She reports a tender spot on the right parietal region, she feels there is a dent in her skull. She was previously on amitriptyline  in 2023. She has times she cannot breathe and wonders about panic attacks, she denies any anxiety. She does not sleep well with her CPAP machine. She states she has not taken her cancer medication, I don't remember to take anything. She lives with her son, her daughter comes to administer her medications. She does not drive. No recent falls, she has right knee pain.    History on Initial Assessment 05/11/2024:  This is a pleasant 53 year old right-handed woman with a history of breast cancer s/p lumpectomy and radiation, palpitations, presenting for evaluation of seizure-like activity. She reports several symptoms, including memory loss, bad headaches, and blacking out. She reports all these symptoms started after colonoscopy in 2022. Since then, things have been different. She repeatedly states I want my life back. She was previously independent with all ADLs, however since 2022 her daughter who is her POA has been managing finances, meals. She stopped driving as well, she was getting lost and could not remember where she was going, having to call her family for help. She manages her medications but reports there is a medication her oncologist prescribed but she has not been taking it because she does not remember being told to take it. Her last visit with Dr. Lanny was in 2023. She does not cook anymore, she had forgotten something on the stove. She states her daughter does everything for me. She lives with her disabled son. Her children have also mentioned forgetfulness. Her maternal grandmother had dementia.   She denies any history of headaches prior to the colonoscopy. Since then, she has had headaches with pressure in the right parietal and left occipital region, like it's clouded. There is some nausea and light sensitivity. For the past couple of months, she has been taking Tylenol  2 tablets daily for headaches that last for hours. It eases but does not fully relieve the pain. She notices the pressure when she gets up in the morning. She is always lightheaded, she has seen Cardiology and  had a holter monitor in 11/2022 where it was noted that symptoms correlate with infrequent ventricular ectopy. She reports feeling the palpitations and feels like she needs more oxygen. She would be sitting and feel like it is hard to breathe. On last Cardiology visit in 12/2023, it was felt that she is having severe daytime  somnolence with frequent episodes of falling asleep. She still reported apneic spells even with her CPAP machine. She was advised to follow-up with her Sleep doctor, however she is not sure who is managing her CPAP. She saw Pulmonary in 02/2024 and is scheduled for PFTs next week. She states she cannot walk far, getting short of breath.   She states she keeps blacking out, she would be doing something then a certain part of her body can't move, then she wakes up on the ground with EMS around her. She does not recall any tongue bite or incontinence. She states these were initially happening all the time but have quieted down, last passing out was 1-2 months ago. She has been told she is staring off sometimes. She denies any olfactory/gustatory hallucinations or myoclonic jerks. She has numbness and tingling in fingers of both hands. She has had left-sided neck pain for the past couple of weeks. She has pain in her right knee, denies any falls. She was in the ER for this and started on Prednisone . She reports mood is good.  On review of records from 2022, she was evaluated by inpatient Neurology after having 2 seizures with generalized body shaking while in the recovery unit after colonoscopy. EMS was called and she was given benzodiazepines then brought to the ER. In the ER, she had another couple of seizures and was very somnolent after receiving a total of 9mg  Versed . 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 She had a routine and 24-hour EEG which were normal, typical events were not captured. 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 She was evaluated by epileptologist Dr. Gregg in 10/2021 for memory  loss and seizure. MMSE was 29/30. EEG in 11/2021 was normal. She was reporting headaches and started on amitriptyline  but unclear if she took it. On last visit in 06/2022, she was started on Citalopram , she is not taking this medication. She was in the ER in 12/2023 for headache, right-sided burning sensation. She got up and had vertigo, lightheadedness, headache, burning on the right side. She then passed out per EMS and fell and hit her head, head CT no acute changes.   She reports a strong family history of seizures, her father had many seizures and died from them. Her son, daughter (has pseudoseizures), and nephew have seizures. She had a normal birth and early development.  There is no history of febrile convulsions, CNS infections such as meningitis/encephalitis, significant traumatic brain injury, neurosurgical procedures  PAST MEDICAL HISTORY: Past Medical History:  Diagnosis Date   Anemia    Anginal pain    Breast cancer (HCC)    Family history of bone cancer    Family history of breast cancer    Family history of prostate cancer    Family history of throat cancer    Malignant neoplasm of upper-outer quadrant of left breast in female, estrogen receptor positive (HCC)  04/08/2021    MEDICATIONS: Current Outpatient Medications on File Prior to Visit  Medication Sig Dispense Refill   albuterol  (VENTOLIN  HFA) 108 (90 Base) MCG/ACT inhaler Inhale 2 puffs into the lungs every 6 (six) hours as needed for wheezing or shortness of breath. 8 g 6   budesonide -formoterol  (SYMBICORT ) 160-4.5 MCG/ACT inhaler Inhale 2 puffs into the lungs 2 (two) times daily. 1 each 12   colchicine  0.6 MG tablet Take 1 tablet (0.6 mg total) by mouth daily for 3 days. 3 tablet 0   HYDROcodone -acetaminophen  (NORCO/VICODIN) 5-325 MG tablet Take 1 tablet by mouth every 6 (six) hours as needed for severe pain (pain score 7-10). 7 tablet 0   metoprolol  tartrate (LOPRESSOR ) 50 MG tablet Take 1 tablet (50 mg total) by mouth  2 (two) times daily. 180 tablet 3   Tiotropium Bromide Monohydrate  (SPIRIVA  RESPIMAT) 2.5 MCG/ACT AERS Inhale 2 puffs into the lungs daily. 1 each 11   Vitamin D , Ergocalciferol , (DRISDOL ) 1.25 MG (50000 UNIT) CAPS capsule TAKE 1 CAPSULE BY MOUTH EVERY 7 DAYS FOR 8 WEEKS 8 capsule 0   No current facility-administered medications on file prior to visit.    ALLERGIES: Allergies  Allergen Reactions   Anesthetics, Amide Other (See Comments)    seizures   Other Other (See Comments)    General Anesthesia caused seizures-Propofol    Propofol  Anaphylaxis    FAMILY HISTORY: Family History  Problem Relation Age of Onset   Heart disease Mother    Hypertension Mother    Heart Problems Mother    Hyperlipidemia Mother    Breast cancer Mother 12   Diabetes Father    Seizures Father    Heart disease Sister    Crohn's disease Sister    Crohn's disease Brother    Hypertension Maternal Aunt    Colon cancer Maternal Uncle    Hypertension Maternal Uncle    Prostate cancer Maternal Uncle    Prostate cancer Maternal Uncle    Cancer Paternal Aunt        unknown type   Cancer Paternal Uncle        unknown type   Cancer Paternal Uncle        unknown type   Throat cancer Paternal Uncle    Hypertension Maternal Grandmother    Stroke Maternal Grandmother    Breast cancer Maternal Grandmother 87   Stroke Maternal Grandfather    Pancreatic cancer Cousin    Breast cancer Cousin        dx 65s, bilateral mastectomies (maternal first cousin)   Bone cancer Nephew 11       knee, great-nephew   Breast cancer Other        great-aunt (MGF's sister)   Breast cancer Other        first cousin once removed (MGF's niece)   Breast cancer Other        first cousin once removed (MGF's niece)   Cancer Other        unknown type, great-uncle (MGM's brother)   Breast cancer Other        first cousin once removed (MGM's niece)   Breast cancer Other        first cousin once removed (MGM's niece)   Prostate  cancer Other        first cousin once removed (MGM's nephew)   Breast cancer Other        second cousin (MGM's great-niece)   Cancer Other        unknown type,  dx 74s (maternal aunt's granddaughter)   Sickle cell anemia Other    Esophageal cancer Neg Hx    Stomach cancer Neg Hx    Rectal cancer Neg Hx     SOCIAL HISTORY: Social History   Socioeconomic History   Marital status: Married    Spouse name: Not on file   Number of children: 4   Years of education: Not on file   Highest education level: Associate degree: occupational, Scientist, product/process development, or vocational program  Occupational History   Occupation: Health and safety inspector: [employer]  Tobacco Use   Smoking status: Never   Smokeless tobacco: Never  Vaping Use   Vaping status: Never Used  Substance and Sexual Activity   Alcohol  use: No   Drug use: No   Sexual activity: Yes    Birth control/protection: Surgical  Other Topics Concern   Not on file  Social History Narrative   Are you right handed or left handed? Right    Are you currently employed ? No    What is your current occupation?   Do you live at home alone?no   Who lives with you? Lives with son    What type of home do you live in: 1 story or 2 story? 1 story        Social Drivers of Corporate investment banker Strain: Medium Risk (11/16/2023)   Overall Financial Resource Strain (CARDIA)    Difficulty of Paying Living Expenses: Somewhat hard  Food Insecurity: No Food Insecurity (11/16/2023)   Hunger Vital Sign    Worried About Running Out of Food in the Last Year: Never true    Ran Out of Food in the Last Year: Never true  Transportation Needs: No Transportation Needs (11/16/2023)   PRAPARE - Administrator, Civil Service (Medical): No    Lack of Transportation (Non-Medical): No  Physical Activity: Unknown (11/16/2023)   Exercise Vital Sign    Days of Exercise per Week: 0 days    Minutes of Exercise per Session: Not on file  Stress: No Stress  Concern Present (11/16/2023)   [employer] of Occupational Health - Occupational Stress Questionnaire    Feeling of Stress : Not at all  Social Connections: Socially Integrated (11/16/2023)   Social Connection and Isolation Panel    Frequency of Communication with Friends and Family: Twice a week    Frequency of Social Gatherings with Friends and Family: Twice a week    Attends Religious Services: 1 to 4 times per year    Active Member of [employer] or Organizations: No    Attends Engineer, structural: More than 4 times per year    Marital Status: Married  Catering manager Violence: Not At Risk (11/18/2023)   Humiliation, Afraid, Rape, and Kick questionnaire    Fear of Current or Ex-Partner: No    Emotionally Abused: No    Physically Abused: No    Sexually Abused: No     PHYSICAL EXAM: Vitals:   08/13/24 1254  BP: 116/78  Pulse: 88  SpO2: 98%   General: No acute distress Head:  Normocephalic/atraumatic Skin/Extremities: No rash, no edema Neurological Exam: alert and awake. No aphasia or dysarthria. Fund of knowledge is appropriate.  Attention and concentration are normal.   Cranial nerves: Pupils equal, round. Extraocular movements intact with no nystagmus. Visual fields full.  No facial asymmetry.  Motor: Bulk and tone normal, muscle strength 5/5 throughout with no pronator drift.  Finger to nose testing intact.  Gait slow and cautious with right knee pain, no ataxia.    IMPRESSION: This is a pleasant 53 yo RH woman with a history of breast cancer s/p lumpectomy and radiation, palpitations,with memory loss, headaches, and blackouts since colonoscopy in 2022. 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 Overnight EEG normal, MRI brain no acute changes in 2022. MRI brain no acute changes, ambulatory 48-hour EEG normal. She continues to report daily headaches and sleep difficulties, and agrees to start  amitriptyline  25mg  at bedtime, side effects discussed. Repeat vitamin D  level. Schedule Neurocognitive testing to further evaluate cognitive concerns. She was advised to keep a calendar of headaches. Follow-up in 4 months, call for any changes.   Thank you for allowing me to participate in her care.  Please do not hesitate to call for any questions or concerns.    Darice Shivers, M.D.   CC: Rosaline Bohr, NP

## 2024-08-13 NOTE — Patient Instructions (Addendum)
 Good to see you.  Repeat bloodwork for vitamin D  level  2. Start Amitriptyline  25mg : Take 1 tablet every night to help cut down on headaches and help with sleep  3. Schedule Neurocognitive testing to evaluate memory  4. Keep a calendar of the headaches  5. Follow-up in 4 months, call for any changes   You have been referred for a neurocognitive evaluation in our office.   The evaluation has two parts.   The first part of the evaluation is a clinical interview with the neuropsychologist (Dr. Richie or Dr. Gayland). Please bring someone with you to this appointment if possible, as it is helpful for the doctor to hear from both you and another adult who knows you well.   The second part of the evaluation is testing with the doctor's technician (Dana or Pierron). The testing includes a variety of tasks- mostly question-and-answer, some paper-and-pencil. There is nothing you need to do to prepare for this appointment, but having a good night's sleep prior to the testing, taking medications as you normally would, and bringing eyeglasses and hearing aids (if you wear them), is advised. Please make sure that you wear a mask to the appointment.  Please note: We have to reserve several hours of the neuropsychologist's time and the psychometrician's time for your evaluation appointment. As such, please note that there is a No-Show fee of $100. If you are unable to attend any of your appointments, please contact our office as soon as possible to reschedule.

## 2024-08-17 LAB — VITAMIN D 1,25 DIHYDROXY
Vitamin D 1, 25 (OH)2 Total: 40 pg/mL (ref 18–72)
Vitamin D2 1, 25 (OH)2: 30 pg/mL
Vitamin D3 1, 25 (OH)2: 10 pg/mL

## 2024-08-20 ENCOUNTER — Ambulatory Visit (INDEPENDENT_AMBULATORY_CARE_PROVIDER_SITE_OTHER): Admitting: Primary Care

## 2024-08-20 ENCOUNTER — Ambulatory Visit: Payer: Self-pay | Admitting: Neurology

## 2024-09-03 ENCOUNTER — Encounter: Payer: Self-pay | Admitting: *Deleted

## 2024-09-03 NOTE — Progress Notes (Signed)
 Returned call to patient from voicemail left regarding utility assistance. Patient is not currently in active treatment and does not qualify to apply for any assistance through us . She has connected with DSS for partial assistance.  She has my contact name and number for any additional financial questions or concerns.

## 2024-09-10 ENCOUNTER — Telehealth (INDEPENDENT_AMBULATORY_CARE_PROVIDER_SITE_OTHER): Payer: Self-pay | Admitting: Primary Care

## 2024-09-10 NOTE — Telephone Encounter (Signed)
 Called pt to confirm appt. Pt will be present.

## 2024-09-11 ENCOUNTER — Ambulatory Visit (INDEPENDENT_AMBULATORY_CARE_PROVIDER_SITE_OTHER): Admitting: Primary Care

## 2024-09-11 VITALS — BP 128/89 | HR 88 | Resp 16 | Ht 69.0 in | Wt 207.4 lb

## 2024-09-11 DIAGNOSIS — R1012 Left upper quadrant pain: Secondary | ICD-10-CM | POA: Diagnosis not present

## 2024-09-11 DIAGNOSIS — Z09 Encounter for follow-up examination after completed treatment for conditions other than malignant neoplasm: Secondary | ICD-10-CM

## 2024-09-11 DIAGNOSIS — M25561 Pain in right knee: Secondary | ICD-10-CM

## 2024-09-11 NOTE — Progress Notes (Signed)
 [facility]  Sheila [name], is a 53 y.o. female  RDW:[id number]  FMW:[id number]  DOB - [birth year]  Chief Complaint  Patient presents with   Hospitalization Follow-up    Urgent Care 07/17/24  Needing 2 referrals- Ortho and GI       Subjective:   Sheila [name] is a 53 y.o. female here today for an acute visit.  HPI The incident occurred more than 1 week ago. There was no injury mechanism. Pain location: right knee can not bend without hurting. The quality of the pain is described as aching. The pain is at a severity of 8/10. The pain is moderate. The pain has been Constant since onset. Associated symptoms include an inability to bear weight, a loss of motion and muscle weakness. The symptoms are aggravated by movement and weight bearing. She has tried heat and rest for the symptoms. The treatment provided no relief.  No problems updated. Bloating, constipation, radiates to back  nauseated  Comprehensive ROS Pertinent positive and negative noted in HPI   Allergies  Allergen Reactions   Anesthetics, Amide Other (See Comments)    seizures   Other Other (See Comments)    General Anesthesia caused seizures-Propofol    Propofol  Anaphylaxis    Past Medical History:  Diagnosis Date   Anemia    Anginal pain    Breast cancer (HCC)    Family history of bone cancer    Family history of breast cancer    Family history of prostate cancer    Family history of throat cancer    Malignant neoplasm of upper-outer quadrant of left breast in female, 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    Current Outpatient Medications on File Prior to Visit  Medication Sig Dispense Refill   albuterol  (VENTOLIN  HFA) 108 (90 Base) MCG/ACT inhaler Inhale 2 puffs into the lungs every 6 (six) hours as needed for wheezing or shortness of breath. 8 g 6   amitriptyline  (ELAVIL ) 25 MG tablet Take 1 tablet every night 30 tablet 11   budesonide -formoterol  (SYMBICORT ) 160-4.5 MCG/ACT inhaler  Inhale 2 puffs into the lungs 2 (two) times daily. 1 each 12   colchicine  0.6 MG tablet Take 1 tablet (0.6 mg total) by mouth daily for 3 days. 3 tablet 0   HYDROcodone -acetaminophen  (NORCO/VICODIN) 5-325 MG tablet Take 1 tablet by mouth every 6 (six) hours as needed for severe pain (pain score 7-10). 7 tablet 0   metoprolol  tartrate (LOPRESSOR ) 50 MG tablet Take 1 tablet (50 mg total) by mouth 2 (two) times daily. 180 tablet 3   Tiotropium Bromide Monohydrate  (SPIRIVA  RESPIMAT) 2.5 MCG/ACT AERS Inhale 2 puffs into the lungs daily. 1 each 11   Vitamin D , Ergocalciferol , (DRISDOL ) 1.25 MG (50000 UNIT) CAPS capsule TAKE 1 CAPSULE BY MOUTH EVERY 7 DAYS FOR 8 WEEKS 8 capsule 0   No current facility-administered medications on file prior to visit.   Health Maintenance  Topic Date Due   COVID-19 Vaccine (1) Never done   Pneumococcal Vaccine for age over 64 (1 of 2 - PCV) Never done   Hepatitis B Vaccine (1 of 3 - 19+ 3-dose series) 12/27/1987   Zoster (Shingles) Vaccine (1 of 2) Never done   Flu Shot  02/12/2025*   Breast Cancer Screening  06/25/2025   Pap with HPV screening  02/25/2026   Colon Cancer Screening  08/07/2028   DTaP/Tdap/Td vaccine (2 - Td or Tdap) 11/17/2033   Hepatitis C Screening  Completed   HIV Screening  Completed  HPV Vaccine  Aged Out   Meningitis B Vaccine  Aged Out  *Topic was postponed. The date shown is not the original due date.    Objective:   Vitals:   09/11/24 1125  BP: 128/89  Pulse: 88  Resp: 16  SpO2: 96%  Weight: 207 lb 6.4 oz (94.1 kg)  Height: 5' 9 (1.753 m)   BP Readings from Last 3 Encounters:  09/11/24 128/89  08/13/24 116/78  07/20/24 134/87      Physical Exam Vitals reviewed.  Constitutional:      Appearance: Normal appearance. She is obese.  HENT:     Head: Normocephalic.     Right Ear: Tympanic membrane, ear canal and external ear normal.     Left Ear: Tympanic membrane, ear canal and external ear normal.     Nose: Nose  normal.     Mouth/Throat:     Mouth: Mucous membranes are moist.  Eyes:     Extraocular Movements: Extraocular movements intact.     Pupils: Pupils are equal, round, and reactive to light.  Cardiovascular:     Rate and Rhythm: Normal rate.  Pulmonary:     Effort: Pulmonary effort is normal.     Breath sounds: Normal breath sounds.  Abdominal:     General: Bowel sounds are normal.     Palpations: Abdomen is soft.  Musculoskeletal:        General: Normal range of motion.     Cervical back: Normal range of motion.  Skin:    General: Skin is warm and dry.  Neurological:     Mental Status: She is alert and oriented to person, place, and time.  Psychiatric:        Mood and Affect: Mood normal.        Behavior: Behavior normal.        Thought Content: Thought content normal.      Assessment & Plan  Chayse was seen today for hospitalization follow-up.  Diagnoses and all orders for this visit:  Hospital discharge follow-up Acute right knee pain   Abdominal pain, left upper quadrant -     Ambulatory referral to Gastroenterology  Acute pain of right knee -     Ambulatory referral to Orthopedic Surgery     Patient have been counseled extensively about nutrition and exercise. Other issues discussed during this visit include: low cholesterol diet, weight control and daily exercise, foot care, annual eye examinations at Ophthalmology, importance of adherence with medications and regular follow-up. We also discussed long term complications of uncontrolled diabetes and hypertension.   No follow-ups on file.  The patient was given clear instructions to go to ER or return to medical center if symptoms don't improve, worsen or new problems develop. The patient verbalized understanding. The patient was told to call to get lab results if they haven't heard anything in the next week.   This note has been created with Education officer, environmental. Any  transcriptional errors are unintentional.   [name], NP 09/11/2024, 11:40 AM

## 2024-09-12 ENCOUNTER — Encounter: Admitting: Obstetrics

## 2024-09-26 ENCOUNTER — Other Ambulatory Visit: Payer: Self-pay

## 2024-09-26 ENCOUNTER — Ambulatory Visit: Admitting: Family

## 2024-09-26 ENCOUNTER — Encounter: Payer: Self-pay | Admitting: Family

## 2024-09-26 DIAGNOSIS — M25561 Pain in right knee: Secondary | ICD-10-CM

## 2024-09-26 DIAGNOSIS — M545 Low back pain, unspecified: Secondary | ICD-10-CM

## 2024-09-26 MED ORDER — PREDNISONE 50 MG PO TABS: ORAL_TABLET | ORAL | 0 refills | Status: DC

## 2024-09-26 NOTE — Progress Notes (Signed)
 Office Visit Note   Patient: Sheila [name]           Date of Birth: 1971/02/06           MRN: [id number] Visit Date: 09/26/2024              Requested by: Celestia Rosaline SQUIBB, NP [address] [place],  [place] [address] PCP: Celestia Rosaline SQUIBB, NP  Chief Complaint  Patient presents with   Right Knee - Pain      HPI: The patient is a 53 year old woman who presents today for initial evaluation of bilateral knee pain right worse than left.  This has been gradually worsening for the last 6 months or more she had significant pain over the summer when she was at the beach she did have an urgent care visit at that time.  Complains of associated popping and snapping sensation of giving way. she has difficulty sleeping due to pain pain with weightbearing denies any associated injury or swelling she is used a knee brace without any relief she is also taken prednisone  a and Tylenol  with minimal relief of her pain  Goes on to report buttock pain that radiates in the posterior thigh   Assessment & Plan: Visit Diagnoses:  1. Acute pain of right knee     Plan: While she does have osteoarthritic changes as well as appearance of chronic tendinopathy patellar tendon her symptoms seem to primarily lumbar radiculopathy on the right side   placed on a prednisone  burst for her lumbar radiculopathy.  Will proceed with lumbar MRI consideration of referral to Dr. Eldonna for [facility]  Follow-Up Instructions: No follow-ups on file.   Right Knee Exam   Muscle Strength  The patient has normal right knee strength.  Tenderness  Right knee tenderness location: Diffuse.  Range of Motion  Extension:  abnormal Right knee extension: Limited by pain.  Other  Swelling: none   Back Exam   Tenderness  The patient is experiencing no tenderness.   Muscle Strength  The patient has normal back strength.  Tests  Straight leg raise right: positive      Patient is alert, oriented,  no adenopathy, well-dressed, normal affect, normal respiratory effort.     Imaging: No results found. No images are attached to the encounter.  Labs: Lab Results  Component Value Date   HGBA1C 5.3 06/30/2020   ESRSEDRATE 17 04/03/2022   LABURIC 5.3 04/03/2022   REPTSTATUS 03/30/2020 FINAL 03/29/2020   CULT (A) 03/29/2020    >=100,000 COLONIES/mL MULTIPLE SPECIES PRESENT, SUGGEST RECOLLECTION     Lab Results  Component Value Date   ALBUMIN 4.4 05/07/2022   ALBUMIN 4.2 01/16/2022   ALBUMIN 3.3 (L) 08/07/2021    No results found for: MG Lab Results  Component Value Date   VD25OH 13 (L) 05/11/2024    No results found for: PREALBUMIN    Latest Ref Rng & Units 04/16/2024    7:19 PM 01/02/2024   10:48 PM 01/02/2024   10:32 PM  CBC EXTENDED  WBC 4.0 - 10.5 K/uL 5.8   6.1   RBC 3.87 - 5.11 MIL/uL 4.38   4.26   Hemoglobin 12.0 - 15.0 g/dL 87.1  86.0  87.3   HCT 36.0 - 46.0 % 39.3  41.0  37.0   Platelets 150 - 400 K/uL 271   300   NEUT# 1.7 - 7.7 K/uL   4.1   Lymph# 0.7 - 4.0 K/uL   1.6  There is no height or weight on file to calculate BMI.  Orders:  Orders Placed This Encounter  Procedures   XR Knee 1-2 Views Right   No orders of the defined types were placed in this encounter.    Procedures: No procedures performed  Clinical Data: No additional findings.  ROS:  All other systems negative, except as noted in the HPI. Review of Systems  Objective: Vital Signs: LMP 04/03/2018 (Within Days)   Specialty Comments:  No specialty comments available.  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   Women's annual routine gynecological examination 02/25/2021   Screening examination for STD (sexually transmitted disease) 02/25/2021   Skin lesion 02/25/2021   Chest pain at rest 05/30/2014   Ejection fraction    Abnormal EKG 04/24/2014   Chest discomfort 04/23/2014   Panic attack 04/23/2014   OBESITY 01/15/2009   HAIR LOSS 01/15/2009   ANEMIA-NOS 01/14/2009   Past Medical History:  Diagnosis Date   Anemia    Anginal pain    Breast cancer (HCC)    Family history of bone cancer    Family history of breast cancer    Family history of prostate cancer    Family history of throat cancer    Malignant neoplasm of upper-outer quadrant of left breast in female, 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    Family History  Problem Relation Age of Onset   Heart disease Mother    Hypertension Mother    Heart Problems Mother    Hyperlipidemia Mother    Breast cancer Mother 20   Diabetes Father    Seizures Father    Heart disease Sister    Crohn's disease Sister    Crohn's disease Brother    Hypertension Maternal Aunt    Colon cancer Maternal Uncle    Hypertension Maternal Uncle    Prostate cancer Maternal Uncle    Prostate cancer Maternal Uncle    Cancer Paternal Aunt        unknown type   Cancer Paternal Uncle        unknown type   Cancer Paternal Uncle        unknown type   Throat cancer Paternal Uncle    Hypertension Maternal Grandmother    Stroke Maternal Grandmother    Breast cancer Maternal Grandmother 52   Stroke Maternal Grandfather    Pancreatic cancer Cousin    Breast cancer Cousin        dx 57s, bilateral mastectomies (maternal first cousin)   Bone cancer Nephew 11       knee, great-nephew   Breast cancer Other        great-aunt (MGF's sister)   Breast cancer Other        first cousin once removed (MGF's niece)   Breast cancer Other        first cousin once removed (MGF's niece)   Cancer  Other        unknown type, great-uncle (MGM's brother)   Breast cancer Other        first cousin  once removed (MGM's niece)   Breast cancer Other        first cousin once removed (MGM's niece)   Prostate cancer Other        first cousin once removed (MGM's nephew)   Breast cancer Other        second cousin (MGM's great-niece)   Cancer Other        unknown type, dx 75s (maternal aunt's granddaughter)   Sickle cell anemia Other    Esophageal cancer Neg Hx    Stomach cancer Neg Hx    Rectal cancer Neg Hx     Past Surgical History:  Procedure Laterality Date   AXILLARY LYMPH NODE BIOPSY Left 04/29/2021   Procedure: AXILLARY LYMPH NODE BIOPSY;  Surgeon: Ebbie Cough, MD;  Location: [facility] OR;  Service: General;  Laterality: Left;   BREAST LUMPECTOMY WITH RADIOACTIVE SEED AND SENTINEL LYMPH NODE BIOPSY Left 04/29/2021   Procedure: LEFT BREAST LUMPECTOMY WITH RADIOACTIVE SEED;  Surgeon: Ebbie Cough, MD;  Location: [facility] OR;  Service: General;  Laterality: Left;   CARDIAC CATHETERIZATION     LEFT HEART CATHETERIZATION WITH CORONARY ANGIOGRAM N/A 05/30/2014   Procedure: LEFT HEART CATHETERIZATION WITH CORONARY ANGIOGRAM;  Surgeon: Peter M Jordan, MD;  Location: [facility] CATH LAB;  Service: Cardiovascular;  Laterality: N/A;   TUBAL LIGATION     Social History   Occupational History   Occupation: Health And Safety Inspector: [employer]  Tobacco Use   Smoking status: Never   Smokeless tobacco: Never  Vaping Use   Vaping status: Never Used  Substance and Sexual Activity   Alcohol  use: No   Drug use: No   Sexual activity: Yes    Birth control/protection: Surgical

## 2024-09-27 ENCOUNTER — Encounter: Payer: Self-pay | Admitting: Obstetrics

## 2024-09-27 ENCOUNTER — Ambulatory Visit (INDEPENDENT_AMBULATORY_CARE_PROVIDER_SITE_OTHER): Admitting: Obstetrics

## 2024-09-27 ENCOUNTER — Encounter: Payer: Self-pay | Admitting: Family

## 2024-09-27 ENCOUNTER — Other Ambulatory Visit (HOSPITAL_COMMUNITY)
Admission: RE | Admit: 2024-09-27 | Discharge: 2024-09-27 | Disposition: A | Discharge status: Home / Self Care | Source: Ambulatory Visit | Attending: Obstetrics | Admitting: Obstetrics

## 2024-09-27 VITALS — BP 129/86 | HR 96 | Ht 69.0 in | Wt 206.0 lb

## 2024-09-27 DIAGNOSIS — N898 Other specified noninflammatory disorders of vagina: Secondary | ICD-10-CM | POA: Insufficient documentation

## 2024-09-27 DIAGNOSIS — E66811 Obesity, class 1: Secondary | ICD-10-CM

## 2024-09-27 DIAGNOSIS — Z01419 Encounter for gynecological examination (general) (routine) without abnormal findings: Secondary | ICD-10-CM

## 2024-09-27 DIAGNOSIS — N951 Menopausal and female climacteric states: Secondary | ICD-10-CM | POA: Diagnosis not present

## 2024-09-27 MED ORDER — FLUCONAZOLE 150 MG PO TABS: 150.0000 mg | ORAL_TABLET | Freq: Once | ORAL | 0 refills | Status: AC

## 2024-09-27 NOTE — Progress Notes (Signed)
 Wants STI testing today. Thinks has yeast infection. Feels discomfort in vaginal. Had last colonoscopy 08/07/2021. Had reaction to anesthesia. PHQ 1  GAD 1

## 2024-09-27 NOTE — Progress Notes (Addendum)
 Subjective:        Sheila [name] is a 53 y.o. female here for a routine exam.  Current complaints: Vaginal discharge and irritation.    Personal health questionnaire:  Is patient Ashkenazi Jewish, have a family history of breast and/or ovarian cancer: yes Is there a family history of uterine cancer diagnosed at age < 3, gastrointestinal cancer, urinary tract cancer, family member who is a Personnel Officer syndrome-associated carrier: yes Is the patient overweight and hypertensive, family history of diabetes, personal history of gestational diabetes, preeclampsia or PCOS: yes Is patient over 2, have PCOS,  family history of premature CHD under age 38, diabetes, smoke, have hypertension or peripheral artery disease:  no At any time, has a partner hit, kicked or otherwise hurt or frightened you?: no Over the past 2 weeks, have you felt down, depressed or hopeless?: no Over the past 2 weeks, have you felt little interest or pleasure in doing things?:no   Gynecologic History Patient's last menstrual period was 04/03/2018 (within days). Contraception: post menopausal status Last Pap: 2022. Results were: normal Last mammogram: 06-25-2024. Results were: normal  Obstetric History OB History  Gravida Para Term Preterm AB Living  8 6 4 2 2 4   SAB IAB Ectopic Multiple Live Births  2        # Outcome Date GA Lbr Len/2nd Weight Sex Type Anes PTL Lv  8 Term           7 Term           6 Term           5 Term           4 Preterm           3 Preterm           2 SAB           1 SAB             Past Medical History:  Diagnosis Date   Anemia    Anginal pain    Breast cancer (HCC)    Family history of bone cancer    Family history of breast cancer    Family history of prostate cancer    Family history of throat cancer    Malignant neoplasm of upper-outer quadrant of left breast in female, 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    Past Surgical History:  Procedure Laterality Date    AXILLARY LYMPH NODE BIOPSY Left 04/29/2021   Procedure: AXILLARY LYMPH NODE BIOPSY;  Surgeon: Ebbie Cough, MD;  Location: [facility] OR;  Service: General;  Laterality: Left;   BREAST LUMPECTOMY WITH RADIOACTIVE SEED AND SENTINEL LYMPH NODE BIOPSY Left 04/29/2021   Procedure: LEFT BREAST LUMPECTOMY WITH RADIOACTIVE SEED;  Surgeon: Ebbie Cough, MD;  Location: [facility] OR;  Service: General;  Laterality: Left;   CARDIAC CATHETERIZATION     LEFT HEART CATHETERIZATION WITH CORONARY ANGIOGRAM N/A 05/30/2014   Procedure: LEFT HEART CATHETERIZATION WITH CORONARY ANGIOGRAM;  Surgeon: Peter M Jordan, MD;  Location: [facility] CATH LAB;  Service: Cardiovascular;  Laterality: N/A;   TUBAL LIGATION       Current Outpatient Medications:    albuterol  (VENTOLIN  HFA) 108 (90 Base) MCG/ACT inhaler, Inhale 2 puffs into the lungs every 6 (six) hours as needed for wheezing or shortness of breath., Disp: 8 g, Rfl: 6   amitriptyline  (ELAVIL ) 25 MG tablet, Take 1 tablet every night, Disp: 30 tablet, Rfl: 11   budesonide -formoterol  (SYMBICORT ) 160-4.5 MCG/ACT inhaler,  Inhale 2 puffs into the lungs 2 (two) times daily., Disp: 1 each, Rfl: 12   fluconazole  (DIFLUCAN ) 150 MG tablet, Take 1 tablet (150 mg total) by mouth once for 1 dose., Disp: 1 tablet, Rfl: 0   metoprolol  tartrate (LOPRESSOR ) 50 MG tablet, Take 1 tablet (50 mg total) by mouth 2 (two) times daily., Disp: 180 tablet, Rfl: 3   predniSONE  (DELTASONE ) 50 MG tablet, Take one tablet by mouth once daily for 5 days., Disp: 5 tablet, Rfl: 0   Tiotropium Bromide Monohydrate  (SPIRIVA  RESPIMAT) 2.5 MCG/ACT AERS, Inhale 2 puffs into the lungs daily., Disp: 1 each, Rfl: 11   colchicine  0.6 MG tablet, Take 1 tablet (0.6 mg total) by mouth daily for 3 days., Disp: 3 tablet, Rfl: 0   HYDROcodone -acetaminophen  (NORCO/VICODIN) 5-325 MG tablet, Take 1 tablet by mouth every 6 (six) hours as needed for severe pain (pain score 7-10). (Patient not taking: Reported on 09/27/2024), Disp: 7  tablet, Rfl: 0   Vitamin D , Ergocalciferol , (DRISDOL ) 1.25 MG (50000 UNIT) CAPS capsule, TAKE 1 CAPSULE BY MOUTH EVERY 7 DAYS FOR 8 WEEKS (Patient not taking: Reported on 09/27/2024), Disp: 8 capsule, Rfl: 0 Allergies  Allergen Reactions   Anesthetics, Amide Other (See Comments)    seizures   Other Other (See Comments)    General Anesthesia caused seizures-Propofol    Propofol  Anaphylaxis    Social History   Tobacco Use   Smoking status: Never   Smokeless tobacco: Never  Substance Use Topics   Alcohol  use: No    Family History  Problem Relation Age of Onset   Heart disease Mother    Hypertension Mother    Heart Problems Mother    Hyperlipidemia Mother    Breast cancer Mother 39   Diabetes Father    Seizures Father    Heart disease Sister    Crohn's disease Sister    Crohn's disease Brother    Hypertension Maternal Aunt    Colon cancer Maternal Uncle    Hypertension Maternal Uncle    Prostate cancer Maternal Uncle    Prostate cancer Maternal Uncle    Cancer Paternal Aunt        unknown type   Cancer Paternal Uncle        unknown type   Cancer Paternal Uncle        unknown type   Throat cancer Paternal Uncle    Hypertension Maternal Grandmother    Stroke Maternal Grandmother    Breast cancer Maternal Grandmother 40   Stroke Maternal Grandfather    Pancreatic cancer Cousin    Breast cancer Cousin        dx 69s, bilateral mastectomies (maternal first cousin)   Bone cancer Nephew 11       knee, great-nephew   Breast cancer Other        great-aunt (MGF's sister)   Breast cancer Other        first cousin once removed (MGF's niece)   Breast cancer Other        first cousin once removed (MGF's niece)   Cancer Other        unknown type, great-uncle (MGM's brother)   Breast cancer Other        first cousin once removed (MGM's niece)   Breast cancer Other        first cousin once removed (MGM's niece)   Prostate cancer Other        first cousin once removed (MGM's  nephew)   Breast cancer  Other        second cousin (MGM's great-niece)   Cancer Other        unknown type, dx 50s (maternal aunt's granddaughter)   Sickle cell anemia Other    Esophageal cancer Neg Hx    Stomach cancer Neg Hx    Rectal cancer Neg Hx       Review of Systems  Constitutional: negative for fatigue and weight loss Respiratory: negative for cough and wheezing Cardiovascular: negative for chest pain, fatigue and palpitations Gastrointestinal: negative for abdominal pain and change in bowel habits Musculoskeletal:negative for myalgias Neurological: negative for gait problems and tremors Behavioral/Psych: negative for abusive relationship, depression Endocrine: negative for temperature intolerance    Genitourinary:negative for abnormal menstrual periods, genital lesions, hot flashes, sexual problems and vaginal discharge Integument/breast: negative for breast lump, breast tenderness, nipple discharge and skin lesion(s)    Objective:       BP 129/86   Pulse 96   Ht 5' 9 (1.753 m)   Wt 206 lb (93.4 kg)   LMP 04/03/2018 (Within Days)   BMI 30.42 kg/m  General:   Alert and no distress  Skin:   no rash or abnormalities  Lungs:   clear to auscultation bilaterally  Heart:   regular rate and rhythm, S1, S2 normal, no murmur, click, rub or gallop  Breasts:   normal without suspicious masses, skin or nipple changes or axillary nodes  Abdomen:  normal findings: no organomegaly, soft, non-tender and no hernia  Pelvis:  External genitalia: normal general appearance Urinary system: urethral meatus normal and bladder without fullness, nontender Vaginal: normal without tenderness, induration or masses Cervix: normal appearance Adnexa: normal bimanual exam Uterus: anteverted and non-tender, normal size   Lab Review Urine pregnancy test Labs reviewed yes  Radiologic studies reviewed yes  I have spent a total of 20 minutes of face-to-face time, excluding clinical staff  time, reviewing notes and preparing to see patient, ordering tests and/or medications, and counseling the patient.   Assessment:    1. Encounter for gynecological examination with Papanicolaou smear of cervix (Primary) Rx: - Cytology - PAP( [place])  2. Vaginal discharge Rx: - Cervicovaginal ancillary only( [place]) - RPR - HIV antibody (with reflex) - Hepatitis B surface antigen - Hepatitis C Antibody - fluconazole  (DIFLUCAN ) 150 MG tablet; Take 1 tablet (150 mg total) by mouth once for 1 dose.  Dispense: 1 tablet; Refill: 0  3. Menopausal state - doing well  4. Obesity (BMI 30.0-34.9)     Plan:    Education reviewed: calcium  supplements, depression evaluation, low fat, low cholesterol diet, safe sex/STD prevention, self breast exams, and weight bearing exercise. Follow up in: 1 year.   Meds ordered this encounter  Medications   fluconazole  (DIFLUCAN ) 150 MG tablet    Sig: Take 1 tablet (150 mg total) by mouth once for 1 dose.    Dispense:  1 tablet    Refill:  0   Orders Placed This Encounter  Procedures   RPR   HIV antibody (with reflex)   Hepatitis B surface antigen   Hepatitis C Antibody    CARLIN RONAL CENTERS, MD, FACOG Attending Obstetrician & Gynecologist, [facility] for [facility], [facility], [place] 09/27/2024

## 2024-09-28 LAB — CERVICOVAGINAL ANCILLARY ONLY
Bacterial Vaginitis (gardnerella): NEGATIVE
Candida Glabrata: NEGATIVE
Candida Vaginitis: NEGATIVE
Chlamydia: NEGATIVE
Comment: NEGATIVE
Comment: NEGATIVE
Comment: NEGATIVE
Comment: NEGATIVE
Comment: NEGATIVE
Comment: NORMAL
Neisseria Gonorrhea: NEGATIVE
Trichomonas: NEGATIVE

## 2024-09-28 LAB — HEPATITIS C ANTIBODY: Hep C Virus Ab: NONREACTIVE

## 2024-09-28 LAB — HEPATITIS B SURFACE ANTIGEN: Hepatitis B Surface Ag: NEGATIVE

## 2024-09-28 LAB — HIV ANTIBODY (ROUTINE TESTING W REFLEX): HIV Screen 4th Generation wRfx: NONREACTIVE

## 2024-09-28 LAB — RPR: RPR Ser Ql: NONREACTIVE

## 2024-10-02 LAB — CYTOLOGY - PAP
Comment: NEGATIVE
Diagnosis: NEGATIVE
Diagnosis: REACTIVE
High risk HPV: NEGATIVE

## 2024-10-03 ENCOUNTER — Other Ambulatory Visit (HOSPITAL_BASED_OUTPATIENT_CLINIC_OR_DEPARTMENT_OTHER): Payer: Self-pay | Admitting: Family

## 2024-10-09 ENCOUNTER — Ambulatory Visit
Admission: RE | Admit: 2024-10-09 | Discharge: 2024-10-09 | Disposition: A | Discharge status: Home / Self Care | Source: Ambulatory Visit | Attending: Family | Admitting: Family

## 2024-10-09 DIAGNOSIS — M545 Low back pain, unspecified: Secondary | ICD-10-CM

## 2024-10-16 ENCOUNTER — Other Ambulatory Visit: Payer: Self-pay | Admitting: Family

## 2024-10-16 DIAGNOSIS — M545 Low back pain, unspecified: Secondary | ICD-10-CM

## 2024-10-30 ENCOUNTER — Encounter: Payer: Self-pay | Admitting: Physical Medicine and Rehabilitation

## 2024-10-30 ENCOUNTER — Ambulatory Visit: Admitting: Physical Medicine and Rehabilitation

## 2024-10-30 DIAGNOSIS — M5416 Radiculopathy, lumbar region: Secondary | ICD-10-CM

## 2024-10-30 DIAGNOSIS — M7918 Myalgia, other site: Secondary | ICD-10-CM

## 2024-10-30 DIAGNOSIS — G8929 Other chronic pain: Secondary | ICD-10-CM

## 2024-10-30 DIAGNOSIS — M5441 Lumbago with sciatica, right side: Secondary | ICD-10-CM | POA: Diagnosis not present

## 2024-10-30 DIAGNOSIS — M797 Fibromyalgia: Secondary | ICD-10-CM

## 2024-10-30 DIAGNOSIS — M5442 Lumbago with sciatica, left side: Secondary | ICD-10-CM | POA: Diagnosis not present

## 2024-10-30 MED ORDER — DULOXETINE HCL 30 MG PO CPEP: ORAL_CAPSULE | ORAL | 1 refills | Status: AC

## 2024-10-30 NOTE — Progress Notes (Unsigned)
 Sheila [name] - 53 y.o. female MRN [id number]  Date of birth: September 27, 1971  Office Visit Note: Visit Date: 10/30/2024 PCP: Celestia Rosaline SQUIBB, NP Referred by: Celestia Rosaline SQUIBB, NP  Subjective: Chief Complaint  Patient presents with   Lower Back - Pain   HPI: Sheila [name] is a 53 y.o. female who comes in today per the request of Rocky Hans, NP for evaluation of chronic, worsening and severe bilateral lower back pain radiating to buttocks down both legs. Right leg and knee pain is most bothersome. Pain ongoing for several years, worsens with standing, walking and prolonged sitting. She describes pain as sore and aching sensation, currently rates as 8 out of 10. Some relief of pain with home exercise regimen, rest and use of medications. History of formal physical therapy with minimal relief of pain. States she has pain all over her body most days.   Recent lumbar MRI imaging shows small right foraminal disc protrusion at L3-L4, closely approximating and potentially irritating the right L3 nerve root. Moderate left L5 foraminal stenosis related to disc bulge and facet arthrosis. Moderate bilateral facet arthrosis at L3-L4 through L5-S1. No high grade spinal canal stenosis noted. No history of lumbar surgery/injections. Patient denies focal weakness, numbness and tingling. No recent trauma or falls. She has history of chronic knee pain, she has seen Erin for this in the past.   Patients course is complicated by anxiety, panic attacks and breast cancer.      Review of Systems  Musculoskeletal:  Positive for back pain, joint pain and myalgias.  Neurological:  Negative for tingling, sensory change, focal weakness and weakness.  All other systems reviewed and are negative.  Otherwise per HPI.  Assessment & Plan: Visit Diagnoses:    ICD-10-CM   1. Chronic bilateral low back pain with bilateral sciatica  M54.42    M54.41    G89.29     2. Lumbar radiculopathy  M54.16      3. Myofascial pain syndrome  M79.18     4. Fibromyalgia  M79.7        Plan: Findings:  Chronic, worsening and severe bilateral lower back pain radiating to buttocks and down both legs. Right leg and knee pain is most bothersome. Patient continues to have severe pain despite good conservative therapies such as formal physical therapy, home exercise regimen, rest and of medications. Patients clinical presentation and exam are complex. Her pain today Is primarily non dermatomal, however right sided pain does fit with L3 nerve distribution. She also experiences diffuse pain all over her body. I discussed recent lumbar MRI with her today using imaging and spine model. There are multi level degenerative changes and facet arthropathy. No high grade spinal canal stenosis noted. I did provide her with fibromyalgia screening in the office today. Her widespread pain index and symptom severity scores do meet diagnostic criteria for fibromyalgia. We discussed treatment plan in detail today. The findings on recent lumbar MRI would not cause her diffuse pain. I do not think injection therapy would be significantly beneficial for her. Her PCP did prescribe her amitriptyline , however she was unable to get this medication due to insurance issues. I would like to try her on short trial of Cymbalta . I explained to her that this medication is also used to treat pain. I will see her back in follow up in about 4 weeks. She can continue to see Rocky for further orthopedic issues. No red flag symptoms noted upon exam today.  Meds & Orders:  Meds ordered this encounter  Medications   DULoxetine  (CYMBALTA ) 30 MG capsule    Sig: Take 1 capsule (30 mg total) once a day by mouth for 2 weeks, then take 1 capsule (30 mg) twice a day.    Dispense:  60 capsule    Refill:  1   No orders of the defined types were placed in this encounter.   Follow-up: Return for 4 week follow up for medication management.   Procedures: No  procedures performed      Clinical History: CLINICAL DATA:  Initial evaluation for lower back pain, lumbar radiculopathy.   EXAM: MRI LUMBAR SPINE WITHOUT CONTRAST   TECHNIQUE: Multiplanar, multisequence MR imaging of the lumbar spine was performed. No intravenous contrast was administered.   COMPARISON:  Prior radiograph from 09/26/2024.   FINDINGS: Segmentation: Standard. Lowest well-formed disc space labeled the L5-S1 level.   Alignment: 3 mm facet mediated anterolisthesis of L4 on L5. Alignment otherwise normal preservation of the normal lumbar lordosis.   Vertebrae: Vertebral body height maintained without acute or chronic fracture. Bone marrow signal intensity within normal limits. No worrisome osseous lesions. Mild degenerative reactive endplate changes noted about the anterior aspect of the T10-11 interspace. No other abnormal marrow edema.   Conus medullaris and cauda equina: Conus extends to the T12-L1 level. Conus and cauda equina appear normal.   Paraspinal and other soft tissues: Unremarkable.   Disc levels:   T10-11 and T11-12: Seen only on sagittal projection. Mild disc bulge with endplate spurring. No stenosis.   T12-L1: Unremarkable.   L1-2:  Unremarkable.   L2-3: Mild annular disc bulge. No spinal stenosis. Foramina remain patent.   L3-4: Degenerative intervertebral disc space narrowing with disc desiccation and diffuse disc bulge. Superimposed small right foraminal disc protrusion closely approximates the right L3 nerve root (series 313, image 28). Moderate left greater than right facet hypertrophy. Resultant mild spinal stenosis. Mild bilateral L3 foraminal narrowing.   L4-5: Anterolisthesis. Disc desiccation with diffuse disc bulge. Moderate right worse than left facet arthrosis. No significant spinal stenosis. Mild bilateral L4 foraminal narrowing.   L5-S1: Disc desiccation with mild disc bulge. Moderate right worse than left facet  arthrosis. No spinal stenosis. Moderate left L5 foraminal narrowing. Right neural foramina remains patent.   IMPRESSION: 1. Small right foraminal disc protrusion at L3-4, closely approximating and potentially irritating the right L3 nerve root. 2. Moderate left L5 foraminal stenosis related to disc bulge and facet arthrosis. 3. Moderate bilateral facet arthrosis at L3-4 through L5-S1. Findings could contribute to lower back pain.     Electronically Signed   By: Morene Hoard M.D.   On: 10/15/2024 00:05   She reports that she has never smoked. She has never used smokeless tobacco. No results for input(s): HGBA1C, LABURIC in the last 8760 hours.  Objective:  VS:  HT:    WT:   BMI:     BP:   HR: bpm  TEMP: ( )  RESP:  Physical Exam Vitals and nursing note reviewed.  HENT:     Head: Normocephalic and atraumatic.     Right Ear: External ear normal.     Left Ear: External ear normal.     Nose: Nose normal.     Mouth/Throat:     Mouth: Mucous membranes are moist.  Eyes:     Extraocular Movements: Extraocular movements intact.  Cardiovascular:     Rate and Rhythm: Normal rate.     Pulses: Normal pulses.  Pulmonary:     Effort: Pulmonary effort is normal.  Abdominal:     General: Abdomen is flat. There is no distension.  Musculoskeletal:        General: Tenderness present.     Cervical back: Normal range of motion.     Comments: Patient rises from seated position to standing without difficulty. Good lumbar range of motion. No pain noted with facet loading. 5/5 strength noted with bilateral hip flexion, knee flexion/extension, ankle dorsiflexion/plantarflexion and EHL. No clonus noted bilaterally. No pain upon palpation of greater trochanters. No pain with internal/external rotation of bilateral hips. Sensation intact bilaterally. Myofascial tenderness noted to bilateral lumbar paraspinal regions upon palpation. Negative slump test bilaterally. Ambulates without aid,  gait steady.     Skin:    General: Skin is warm and dry.     Capillary Refill: Capillary refill takes less than 2 seconds.  Neurological:     General: No focal deficit present.     Mental Status: She is alert and oriented to person, place, and time.  Psychiatric:        Mood and Affect: Mood normal.        Behavior: Behavior normal.     Ortho Exam  Imaging: No results found.  Past Medical/Family/Surgical/Social History: Medications & Allergies reviewed per EMR, new medications updated. Patient Active Problem List   Diagnosis Date Noted   Hepatitis B immune 08/09/2023   Seizure-like activity (HCC) 08/09/2021   Postmenopausal bleeding 06/02/2021   Genetic testing 04/21/2021   Family history of breast cancer    Family history of bone cancer    Family history of prostate cancer    Family history of throat cancer    Malignant neoplasm of upper-outer quadrant of left breast in female, 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   Women's annual routine gynecological examination 02/25/2021   Screening examination for STD (sexually transmitted disease) 02/25/2021   Skin lesion 02/25/2021   Chest pain at rest 05/30/2014   Ejection fraction    Abnormal EKG 04/24/2014   Chest discomfort 04/23/2014   Panic attack 04/23/2014   OBESITY 01/15/2009   HAIR LOSS 01/15/2009   ANEMIA-NOS 01/14/2009   Past Medical History:  Diagnosis Date   Anemia    Anginal pain    Breast cancer (HCC)    Family history of bone cancer    Family history of breast cancer    Family history of prostate cancer    Family history of throat cancer    Malignant neoplasm of upper-outer quadrant of left breast in female, 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   Family History  Problem Relation Age of Onset   Heart disease Mother    Hypertension Mother    Heart Problems Mother    Hyperlipidemia Mother    Breast cancer Mother 51   Diabetes Father    Seizures Father    Heart disease Sister    Crohn's  disease Sister    Crohn's disease Brother    Hypertension Maternal Aunt    Colon cancer Maternal Uncle    Hypertension Maternal Uncle    Prostate cancer Maternal Uncle    Prostate cancer Maternal Uncle    Cancer Paternal Aunt        unknown type   Cancer Paternal Uncle        unknown type   Cancer Paternal Uncle        unknown type   Throat cancer Paternal Uncle    Hypertension Maternal Grandmother  Stroke Maternal Grandmother    Breast cancer Maternal Grandmother 3   Stroke Maternal Grandfather    Pancreatic cancer Cousin    Breast cancer Cousin        dx 41s, bilateral mastectomies (maternal first cousin)   Bone cancer Nephew 11       knee, great-nephew   Breast cancer Other        great-aunt (MGF's sister)   Breast cancer Other        first cousin once removed (MGF's niece)   Breast cancer Other        first cousin once removed (MGF's niece)   Cancer Other        unknown type, great-uncle (MGM's brother)   Breast cancer Other        first cousin once removed (MGM's niece)   Breast cancer Other        first cousin once removed (MGM's niece)   Prostate cancer Other        first cousin once removed (MGM's nephew)   Breast cancer Other        second cousin (MGM's great-niece)   Cancer Other        unknown type, dx 51s (maternal aunt's granddaughter)   Sickle cell anemia Other    Esophageal cancer Neg Hx    Stomach cancer Neg Hx    Rectal cancer Neg Hx    Past Surgical History:  Procedure Laterality Date   AXILLARY LYMPH NODE BIOPSY Left 04/29/2021   Procedure: AXILLARY LYMPH NODE BIOPSY;  Surgeon: Ebbie Cough, MD;  Location: [facility] OR;  Service: General;  Laterality: Left;   BREAST LUMPECTOMY WITH RADIOACTIVE SEED AND SENTINEL LYMPH NODE BIOPSY Left 04/29/2021   Procedure: LEFT BREAST LUMPECTOMY WITH RADIOACTIVE SEED;  Surgeon: Ebbie Cough, MD;  Location: [facility] OR;  Service: General;  Laterality: Left;   CARDIAC CATHETERIZATION     LEFT HEART  CATHETERIZATION WITH CORONARY ANGIOGRAM N/A 05/30/2014   Procedure: LEFT HEART CATHETERIZATION WITH CORONARY ANGIOGRAM;  Surgeon: Peter M Jordan, MD;  Location: [facility] CATH [facility];  Service: Cardiovascular;  Laterality: N/A;   TUBAL LIGATION     Social History   Occupational History   Occupation: Health And Safety Inspector: [employer]  Tobacco Use   Smoking status: Never   Smokeless tobacco: Never  Vaping Use   Vaping status: Never Used  Substance and Sexual Activity   Alcohol  use: No   Drug use: No   Sexual activity: Yes    Partners: Male    Birth control/protection: Surgical, Condom

## 2024-10-30 NOTE — Progress Notes (Unsigned)
 Pain Scale   Average Pain 10 Patient advising she has chronic lower back pain radiating to right leg        +Driver, -BT, -Dye Allergies.

## 2024-11-21 ENCOUNTER — Ambulatory Visit: Payer: Self-pay | Admitting: Psychology

## 2024-11-21 ENCOUNTER — Ambulatory Visit (INDEPENDENT_AMBULATORY_CARE_PROVIDER_SITE_OTHER): Admitting: Psychology

## 2024-11-21 DIAGNOSIS — R4189 Other symptoms and signs involving cognitive functions and awareness: Secondary | ICD-10-CM

## 2024-11-21 DIAGNOSIS — F4323 Adjustment disorder with mixed anxiety and depressed mood: Secondary | ICD-10-CM

## 2024-11-21 NOTE — Progress Notes (Signed)
" ° °  Psychometrician Note   Cognitive testing was administered to Sheila [name] by Lonell Jude, B.S. (psychometrist) under the supervision of Dr. Renda Beckwith, Psy.D., licensed psychologist on 11/21/2024. [name] did not appear overtly distressed by the testing session per behavioral observation or responses across self-report questionnaires. Rest breaks were offered.   The battery of tests administered was selected by Dr. Renda Beckwith, Psy.D. with consideration to [name]'s current level of functioning, the nature of her symptoms, emotional and behavioral responses during interview, level of literacy, observed level of motivation/effort, and the nature of the referral question. This battery was communicated to the psychometrist. Communication between Dr. Renda Beckwith, Psy.D. and the psychometrist was ongoing throughout the evaluation and Dr. Renda Beckwith, Psy.D. was immediately accessible at all times. Dr. Renda Beckwith, Psy.D. provided supervision to the psychometrist on the date of this service to the extent necessary to assure the quality of all services provided.    Sheila Sharps Loann will return within approximately 1-2 weeks for an interactive feedback session with Dr. Beckwith at which time her test performances, clinical impressions, and treatment recommendations will be reviewed in detail. [name] understands she can contact our office should she require our assistance before this time.  A total of 135 minutes of billable time were spent face-to-face with [name] by the psychometrist. This includes both test administration and scoring time. Billing for these services is reflected in the clinical report generated by Dr. Renda Beckwith, Psy.D.  This note reflects time spent with the psychometrician and does not include test scores or any clinical interpretations made by Dr. Beckwith. The full report will follow in a separate note. "

## 2024-11-21 NOTE — Progress Notes (Signed)
 "  NEUROPSYCHOLOGICAL EVALUATION [place]  [place] Department of Neurology  Date of Evaluation: 11/21/2024  REASON FOR REFERRAL   Sheila [name] is a 54 year old, right-handed, Black female with 14 years of formal education. She was referred for neuropsychological evaluation by her neurologist, Sheila [name], M.D., to assess current neurocognitive functioning, document potential cognitive deficits, and assist with treatment planning. This is her first neuropsychological evaluation.  SUMMARY OF RESULTS   On assessment today, the patient scored below cutoff levels on select standalone performance validity measures. Results must be interpreted with caution, as findings may underestimate her Sheila cognitive abilities. Areas of normal performance can be considered as valid indicators of what she is capable of at a minimum, while impaired scores are not interpreted.  Premorbid cognitive abilities are estimated to be in the low average range based on word reading and sociodemographic factors. Relative to this baseline estimate, current performance was generally within expectations across non-memory domains, although some relative weaknesses were noted in verbal abstract reasoning, perseverative errors on a novel problem-solving task, confrontation naming, and line orientation judgment. However, as mentioned before, these findings should be interpreted cautiously due to validity concerns. Performance in learning/memory could not be reliably assessed and is therefore not interpreted at this time.  On self-report questionnaires, she reported mild symptoms of anxiety and minimal symptoms of depression.  DIAGNOSTIC IMPRESSION   Results of the current evaluation could not be interpreted with a sufficient degree of clinical confidence due to scores below cutoff ranges on multiple measures of performance validity. It should be noted that suboptimal engagement on validity measures does not  preclude the presence of a neurocognitive condition but complicates the interpretation of data used to diagnose such a condition. It should also be mentioned that variable effort on performance validity tests does not necessarily indicate a deliberate attempt to feign symptoms or exaggerate cognitive difficulties. Various factors, such as fatigue, mood, unfamiliarity with the test format, or cognitive challenges can lead to reduced performance, even when the individual is trying their best. Therefore, while today's assessment raises important questions, it is essential to consider the broader context and other clinical factors before drawing definitive conclusions.  Overall, performance across most non-memory cognitive domains was generally consistent with expectations. Memory functioning, however, could not be interpreted with clinical confidence. If considered at face value, the observed pattern of memory difficulties does not correspond with typical profiles associated with seizure disorders. The profile may suggest greater difficulty with initial learning rather than retention, recall, or recognition; however, this pattern is nonspecific anyway and insufficient to support a particular etiological conclusion.  Several observations during the evaluation further complicate interpretation. Specifically, she demonstrated notable difficulty responding to questions and providing autobiographical information, challenges that would be unusual even for individuals with significant cognitive impairment. Additionally, her level of daily functioning appears more impaired than would be expected based on her performance in most cognitive domains, even when considering those scores as minimal estimates of ability.  In summary, definitive conclusions regarding neurocognitive diagnosis or etiology cannot be drawn from this evaluation alone. Clinical decisions should be guided by a comprehensive review of historical,  contextual, and corroborating information. Most importantly, she has several modifiable risk factors for cognitive impairment that may be particularly relevant to her current condition and daily functioning, including significant chronic pain accompanied by mood changes, sleep disturbance, and difficulty adjusting to unplanned retirement.  ICD-10 Codes: F43.23 Adjustment disorder; R41.89 Cognitive changes  RECOMMENDATIONS   In consultation with your  doctor, schedule cognitive reevaluation on an as-needed basis to assess for cognitive decline and update treatment recommendations. Reevaluation should occur during a period of medical and affective stability.  Given ongoing sleep difficulties, she may benefit from the implementation of sleep hygiene techniques, including:  Go to bed and get up at the same time each day to help your body establish a regular rhythm. Establish and maintain a bedtime routine. Certain activities such as stretching, meditating, listening to soft music, or reading ~15 minutes before bedtime can be a great way to regularly get your brain and body ready for sleep. Avoid taking naps during the day. Avoid alcohol  and caffeine  for 5 or 6 hours before going to bed. Get regular exercise, but not in the hours before bedtime. Use comfortable bedding and maintain a cool temperature in your bedroom. Block out light and distracting noise. Avoid watching television or using your phone/computer in bed. Avoid staying in bed if you have difficulty falling asleep. If you have not been able to get to sleep after about 20 minutes or more, get up and do something calming or boring until you feel sleepy, then return to bed and try again.  Prioritize physical health through diet, exercise, and sleep. Regular physical activity supports cardiovascular health, improves mood, and helps preserve mobility and independence. Aim for at least 150 minutes of moderate aerobic exercise per week (e.g., brisk  walking, swimming, gardening). A brain-healthy diet such as the Mediterranean or MIND diet is rich in fruits, vegetables, whole grains, healthy fats, and lean proteins, and has been associated with reduced risk of cognitive decline. Additionally, getting adequate, quality sleep and managing chronic conditions with the help of healthcare providers are essential components of healthy aging.  Continue to stay socially and mentally engaged. Maintaining strong social connections and regularly stimulating your brain can help protect against cognitive decline. This includes staying connected with friends and family, volunteering, or participating in community groups. Mentally engaging activities--such as reading, doing puzzles, playing strategy games, or learning a new language or musical instrument--promote brain plasticity. If you are interested in activities to support cognitive engagement, this site offers a variety of apps and games organized by difficulty level:  https://www.[facility].org/get-to-know-barrow/centers-programs/neurorehabilitation-center/neuro-rehab-apps-and-games/  Consider implementing compensatory strategies to maximize independence and maintain daily functioning. Examples include:  Adhere to routine. Compensatory strategies work best when they are used consistently. Use a planner, calendar, or white board that has the schedule and important events for the day clearly listed to reference and cross off when tasks are complete.  Ask for written information, especially if it is new or unfamiliar (e.g., information provided at a doctor's appointment).  Create an organized environment. Keep items that can be easily misplaced in a sensible location and get into the habit of always returning the items to those places. Pay attention and reduce distractions. Make a point of focusing attention on information you want to remember. One-on-one interaction is more likely to facilitate attention and  minimize distraction. Make eye contact and repeat the information out loud after you hear it. Reduce interruptions or distractions especially when attempting to learn new information.  Create associations. When learning something new, think about and understand the information. Explain it in your own words or try to associate it with something you already know. Take notes to help remember important details. Evaluate goals and plan accordingly. When confronted by many different tasks, begin by making a list that prioritizes each task and estimates the time it will take to complete. Break  down complicated tasks into smaller, more manageable steps. Focus on one task at a time and complete each task before starting another. Avoid multitasking.  DISPOSITION   Patient will follow up with the referring provider, Dr. Georjean. No follow-up neuropsychological testing was scheduled at this time. Please feel free to refer the patient for repeated evaluation if she shows a significant change in neurocognitive status. She will be provided verbal feedback in approximately one week regarding the findings and impression during this visit.  The remainder of the report includes the details of the patient's background and a table of results from the current evaluation, which support the summary and recommendations described above.  BACKGROUND   History of Presenting Illness: The following information was obtained from a review of medical records and an interview with the patient. Briefly, the patient has been followed by Dr. Georjean since 2025 for seizure-like activity following a colonoscopy in 2022. 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 All EEGs and MRIs completed to date have been unremarkable for seizures. Additional reported symptoms include blackouts, headaches, and memory loss. MMSE score on 05/11/2024 was 28/30. Her  daughter has assumed responsibility for many of her instrumental activities of daily living. Patient was referred for neuropsychological evaluation accordingly.  Cognitive Functioning: During today's appointment, the patient reported experiencing cognitive changes for a while, though she was unable to specify a more precise timeframe. She noted difficulties with short-term memory, which her children have also observed, such as forgetting recent conversations and names or misplacing items. She described her attention as scattered across multiple tasks simultaneously and reported word-finding difficulties and slower processing speed. She has experienced some navigational difficulties, which she later cited as a reason for stopping driving; however, her description of the events appeared more consistent with attentional lapses. She feels capable of staying organized but does not engage much in planning or problem solving, as her daughter manages most of her affairs.  Physical Functioning: Patient reported chronically poor sleep and uses her CPAP nightly, though she states it does not improve her comfort while sleeping. She noted a decrease in appetite but denied any changes in her sense of smell or taste. Her vision and hearing are reportedly stable. She described some balance difficulties but denied any falls, except for those that occurred during her blackouts. She experiences chronic pain in her legs and knees, as well as headaches. She denied tremors.  Emotional Functioning: Patient described her recent mood as good but acknowledged that her pain is affecting her. She denied any suicidal ideation. She expressed that she greatly enjoyed her job and wishes to resume her prior level of functioning but currently feels unable to do so.  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 No age-advanced or lobar-predominant cerebral atrophy was identified.  Other Relevant Medical History: Remarkable for chronic pain and breast cancer s/p lumpectomy and radiation. Please refer to the medical record for a more comprehensive problem list. No history of stroke, CNS infection, or head injury, was reported.  Current Medications: Per record, albuterol , budesonide -formoterol , colchicine , duloxetine , metoprolol , prednisone , and Spiriva .   Functional Status: Patient discontinued driving approximately one year ago due to navigational difficulties, although she did not report any accidents or traffic violations prior to  that. Her daughter now manages her finances and medications, as the patient has experienced forgetfulness. She had fallen behind on multiple gas bills, which reportedly amounted to approximately $2,000. She also reports limited awareness of her medications, making it difficult for her to keep track of them. She is able to prepare meals and operate household appliances without difficulty. She remains independent with all basic activities of daily living.  Family Neurological History: Per neurology, patient reported a strong family history of seizures, her father had many seizures and died from them. Her son, daughter (has pseudoseizures), and nephew have seizures. She also reported memory problems in her mother following meningitis.  Psychiatric History: Patient denied history of depression, anxiety, prior mental health treatment, suicidal ideation, hallucinations, and psychiatric hospitalizations.  Substance Use History: Patient denied current use of alcohol , nicotine, marijuana, and other illicit substances. Additionally, there is no reported history of past problematic substance use.  Social and Developmental History: Patient was born in [place], [place]. History of perinatal  complications and developmental delays was not reported. Patient is legally married but separated. She has four children. She currently lives with her son.   Educational and Occupational History: Patient was unable to provide any information regarding her educational history. She reported that between approximately the fourth and eighth grades, she was transferred to a school for individuals who did not complete regular schooling. Her daughter was contacted for collateral information and reported that the patient ultimately obtained an associate degree. Beyond this, limited information is available regarding the patients academic background. 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 She stated that she is currently involved with an attorney regarding a disability claim.  BEHAVIORAL OBSERVATIONS   Patient arrived early and was unaccompanied. She ambulated independently. Gait was antalgic. She was alert and fully oriented. She was appropriately groomed and dressed for the setting. No significant sensory or motor abnormalities were observed. Vision (with glasses) and hearing were adequate for testing purposes. Speech was of normal rate, prosody, and volume. No conversational word-finding difficulties, paraphasic errors, or dysarthria were observed. Comprehension was conversationally intact. Thought processes were linear, logical, and coherent. Thought content was organized and devoid of delusions. Insight appeared fair. However, she was a questionable historian. Affect was even and congruent with mood.   She appeared cooperative; however, scores on performance validity measures were inconsistent, with some measures above the cutoff level and others well below it. Consequently, results should be interpreted with caution, as variable performance may not fully represent Sheila cognitive abilities. Areas in which she performed in the normal range can be viewed as valid  indicators of at least normal abilities in those areas while impaired scores are not interpreted.SABRA  NEUROPSYCHOLOGICAL TESTING RESULTS   Tests Administered: Animal Naming Test; Beck Anxiety Inventory (BAI); Beck Depression Inventory II (BDI-II); Brief Visuospatial Memory Test-Revised (BVMT-R) - Form 1; Controlled Oral Word Association Test (COWAT): FAS; Hopkins Verbal Learning Test-Revised (HVLT-R) - From 1; Neuropsychological Assessment Battery (NAB) - Subtest(s): Naming Form 1; Repeatable Battery for the Assessment of Neuropsychological Status Update (RBANS Update) Form A - Subtest(s): Line Orientation; Standalone performance validity tests (PVTs); Test of Premorbid Functioning (TOPF); Trail Making Test (TMT); Wechsler Adult Intelligence Scale Fifth Edition (WAIS-5) - Subtest(s): Similarities, Clinical Cytogeneticist, Digits Forward, Digit Sequencing, Coding, Symbol Search, Digits Backward; Wechsler Memory Scale Fourth Edition (WMS-IV) - Subtest(s): Logical Memory (LM); and [place]  Card Sorting Test 64 Card Version (WCST-64).  Test results are provided in the table below. Whenever  possible, the patient's scores were compared against age-, sex-, and education-corrected normative samples. Interpretive descriptions are based on the AACN consensus conference statement on uniform labeling (Guilmette et al., 2020).  PREMORBID FUNCTIONING RAW  RANGE  TOPF 26 StdS=85 Low Average  ATTENTION & WORKING MEMORY RAW  RANGE  WAIS-5 Digits Forward -- ss=8 Average  WAIS-5 Digits Backward -- ss=11 Average  WAIS-5 Digit Sequencing -- ss=8 Average  PROCESSING SPEED RAW  RANGE  Trails A 39''1e T=44 Average  WAIS-5 Coding  -- ss=11 Average  WAIS-5 Symbol Search -- ss=10 Average  EXECUTIVE FUNCTION RAW  RANGE  Trails B 110''2e T=45 Average  WAIS-5 Similarities -- ss=4 Below Average  COWAT Letter Fluency 13+9+12 T=49 Average  WCST-64 Total Errors 27 T=37 Low Average  WCST-64 Perseverative Errors 18 T=35 Below Average   WCST-64 Nonperseverative Errors 9 T=41 Low Average  WCST-64 Categories Completed 2 11-16%ile Low Average  WCSR-64 FMS 0 -- --  LANGUAGE RAW  RANGE  COWAT Letter Fluency 13+9+12 T=49 Average  Animal Naming Test 15 T=48 Average  NAB Naming Test 28/31 T=35 BNL  VISUOSPATIAL RAW  RANGE  RBANS Line Orientation -- 3-9%ile Below Average  WAIS-5 Block Design -- ss=12 High Average  BVMT-R Copy Trial 12/12 -- WNL  VERBAL LEARNING & MEMORY RAW  RANGE  HVLT-R Learning Trials (4+6+6)/36 T=22 Exceptionally Low  HVLT-R Delayed Recall 4/12 T=17 Exceptionally Low  HVLT-R Recognition Hits 10 -- --  HVLT-R Recognition False Positives 3 -- --  HVLT-R Discrimination Index 7 T=23 Exceptionally Low  WMS-IV LM-I  (10+6)/50 ss=6 Low Average  WMS-IV LM-II  (6+3)/50 ss=5 Below Average  WMS-IV LM Recognition  (9+10)/30 3-9%ile Below Average  VISUAL LEARNING & MEMORY RAW  RANGE  BVMT-R Total Recall (4+6+6)/36 T=36 Below Average  BVMT-R Delayed Recall 5/12 T=32 Below Average  BVMT-R Recognition Hits 4 3-5%ile Below Average  BVMT-R Recognition False Alarms 0 >16%ile WNL  BVMT-R Recognition Discrimination Index 4 6-10%ile Below Average to Low Average  QUESTIONNAIRES RAW  RANGE  BDI 11 -- Minimal  BAI 13 -- Mild  *Note: ss = scaled score; StdS = standard score; T = t-score; C/S = corrected raw score; WNL = within normal limits; BNL= below normal limits; D/C = discontinued. Scores from skewed distributions are typically interpreted as WNL (>=16th %ile) or BNL (<16th %ile).   INFORMED CONSENT   Patient was provided with a verbal description of the nature and purpose of the neuropsychological evaluation. Also reviewed were the foreseeable risks and/or discomforts and benefits of the procedure, limits of confidentiality, and mandatory reporting requirements of this provider. Patient was given the opportunity to have their questions answered. Oral consent to participate was provided by the patient.   This report was  prepared as part of a clinical evaluation and is not intended for forensic use.  SERVICE   This evaluation was conducted by Renda Beckwith, Psy.D. In addition to time spent directly with the patient, total professional time (180 minutes) includes record review, integration of relevant medical history, test selection, interpretation of findings, and report preparation. A technician, Lonell Jude, B.S., provided testing and scoring assistance (135 minutes).  Psychiatric Diagnostic Evaluation Services (Professional): 09208 x 1 Neuropsychological Testing Evaluation Services (Professional): [phone] x 1 Neuropsychological Testing Evaluation Services (Professional): 03866 x 2 Neuropsychological Test Administration and Scoring (Technician): [phone] x 1 Neuropsychological Test Administration and Scoring (Technician): [phone] x 3  This report was generated using voice recognition software. While this document has been carefully reviewed, transcription errors may  be present. I apologize in advance for any inconvenience. Please contact me if further clarification is needed.            Renda Beckwith, Psy.D.             Neuropsychologist  "

## 2024-11-27 ENCOUNTER — Ambulatory Visit: Admitting: Physical Medicine and Rehabilitation

## 2024-11-27 ENCOUNTER — Encounter: Payer: Self-pay | Admitting: Physical Medicine and Rehabilitation

## 2024-11-27 DIAGNOSIS — M47819 Spondylosis without myelopathy or radiculopathy, site unspecified: Secondary | ICD-10-CM | POA: Diagnosis not present

## 2024-11-27 DIAGNOSIS — G8929 Other chronic pain: Secondary | ICD-10-CM

## 2024-11-27 DIAGNOSIS — M47816 Spondylosis without myelopathy or radiculopathy, lumbar region: Secondary | ICD-10-CM | POA: Diagnosis not present

## 2024-11-27 DIAGNOSIS — M545 Low back pain, unspecified: Secondary | ICD-10-CM | POA: Diagnosis not present

## 2024-11-27 NOTE — Progress Notes (Signed)
 Pain Scale   Average Pain 0 patient advising the medication given she was unable to take due to headache and that her pain in her lower back is better, but her knee is painful.         +Driver, -BT, -Dye Allergies.

## 2024-11-27 NOTE — Progress Notes (Signed)
 "  Sheila [name] - 54 y.o. female MRN [id number]  Date of birth: July 15, 1971  Office Visit Note: Visit Date: 11/27/2024 PCP: Celestia Rosaline SQUIBB, NP Referred by: Celestia Rosaline SQUIBB, NP  Subjective: Chief Complaint  Patient presents with   Lower Back - Pain   HPI: Sheila [name] is a 54 y.o. female who comes in today for evaluation of chronic bilateral lower back pain. She is here today in follow up for medication management. During our last office visit in December I started her on Cymbalta . She tried this medication for 3 days, however stopped due to severe headache and nausea. Lower back pain ongoing for several years. States her lower back pain has improved with continued use of Ibuprofen . Her right knee seems to be biggest pain generator at this time. Her pain worsens with standing, walking and prolonged sitting. She describes pain as sore and aching sensation, currently rates as 8 out of 10. History of formal physical therapy with minimal relief of pain. Recent lumbar MRI imaging shows small right foraminal disc protrusion at L3-L4, closely approximating and potentially irritating the right L3 nerve root. Moderate left L5 foraminal stenosis related to disc bulge and facet arthrosis. Moderate bilateral facet arthrosis at L3-L4 through L5-S1. No high grade spinal canal stenosis noted. Patient denies focal weakness, numbness and tingling. No recent trauma or falls.      Review of Systems  Musculoskeletal:  Positive for back pain and joint pain.  Neurological:  Negative for tingling, sensory change, focal weakness and weakness.  All other systems reviewed and are negative.  Otherwise per HPI.  Assessment & Plan: Visit Diagnoses:    ICD-10-CM   1. Chronic bilateral low back pain without sciatica  M54.50    G89.29     2. Spondylosis without myelopathy or radiculopathy  M47.819     3. Facet arthropathy, lumbar  M47.816        Plan: Findings:  Chronic bilateral axial lower  back pain. No radicular pain today. Patient continues to have pain despite good conservative therapies such as formal physical therapy, home exercise regimen, rest and use of medications. Patients clinical presentation and exam are consistent with facet mediated pain. There is multi level facet arthropathy on recent lumbar MRI imaging. Moderate bilateral facet arthrosis at L3-4 through L5-S1. She does have pain with lumbar extension today. We discussed treatment plan in detail today including possibility of performing lumbar facet joint injections. Also discussed longer sustained pain relief with radiofrequency ablation. 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 She would like some time to think about injection therapy, she will get back to us  should she wish to proceed. No red flag symptoms noted upon exam today.   Her biggest concern at this time is chronic right knee pain. She was previously evaluated in November of 2025. I did have her schedule follow up visit with Erin to re-evaluate right knee pain, consideration of further imaging and injection therapy. She has no questions at this time.     Meds & Orders: No orders of the defined types were placed in this encounter.  No orders of the defined types were placed in this encounter.   Follow-up: Return if symptoms worsen or fail to improve.   Procedures: No procedures performed      Clinical History: CLINICAL DATA:  Initial evaluation for lower back pain, lumbar radiculopathy.   EXAM: MRI LUMBAR SPINE WITHOUT CONTRAST  TECHNIQUE: Multiplanar, multisequence MR imaging of the lumbar spine was performed. No intravenous contrast was administered.   COMPARISON:  Prior radiograph from 09/26/2024.   FINDINGS: Segmentation: Standard. Lowest well-formed disc space labeled the L5-S1 level.   Alignment: 3 mm facet mediated anterolisthesis of L4 on L5. Alignment otherwise normal  preservation of the normal lumbar lordosis.   Vertebrae: Vertebral body height maintained without acute or chronic fracture. Bone marrow signal intensity within normal limits. No worrisome osseous lesions. Mild degenerative reactive endplate changes noted about the anterior aspect of the T10-11 interspace. No other abnormal marrow edema.   Conus medullaris and cauda equina: Conus extends to the T12-L1 level. Conus and cauda equina appear normal.   Paraspinal and other soft tissues: Unremarkable.   Disc levels:   T10-11 and T11-12: Seen only on sagittal projection. Mild disc bulge with endplate spurring. No stenosis.   T12-L1: Unremarkable.   L1-2:  Unremarkable.   L2-3: Mild annular disc bulge. No spinal stenosis. Foramina remain patent.   L3-4: Degenerative intervertebral disc space narrowing with disc desiccation and diffuse disc bulge. Superimposed small right foraminal disc protrusion closely approximates the right L3 nerve root (series 313, image 28). Moderate left greater than right facet hypertrophy. Resultant mild spinal stenosis. Mild bilateral L3 foraminal narrowing.   L4-5: Anterolisthesis. Disc desiccation with diffuse disc bulge. Moderate right worse than left facet arthrosis. No significant spinal stenosis. Mild bilateral L4 foraminal narrowing.   L5-S1: Disc desiccation with mild disc bulge. Moderate right worse than left facet arthrosis. No spinal stenosis. Moderate left L5 foraminal narrowing. Right neural foramina remains patent.   IMPRESSION: 1. Small right foraminal disc protrusion at L3-4, closely approximating and potentially irritating the right L3 nerve root. 2. Moderate left L5 foraminal stenosis related to disc bulge and facet arthrosis. 3. Moderate bilateral facet arthrosis at L3-4 through L5-S1. Findings could contribute to lower back pain.     Electronically Signed   By: Morene Hoard M.D.   On: 10/15/2024 00:05   She reports  that she has never smoked. She has never used smokeless tobacco. No results for input(s): HGBA1C, LABURIC in the last 8760 hours.  Objective:  VS:  HT:    WT:   BMI:     BP:   HR: bpm  TEMP: ( )  RESP:  Physical Exam Vitals and nursing note reviewed.  HENT:     Head: Normocephalic and atraumatic.     Right Ear: External ear normal.     Left Ear: External ear normal.     Nose: Nose normal.     Mouth/Throat:     Mouth: Mucous membranes are moist.  Eyes:     Extraocular Movements: Extraocular movements intact.  Cardiovascular:     Rate and Rhythm: Normal rate.     Pulses: Normal pulses.  Pulmonary:     Effort: Pulmonary effort is normal.  Abdominal:     General: Abdomen is flat. There is no distension.  Musculoskeletal:        General: Tenderness present.     Cervical back: Normal range of motion.     Comments: Patient rises from seated position to standing without difficulty. Pain noted with lumbar extension and facet loading. 5/5 strength noted with bilateral hip flexion, knee flexion/extension, ankle dorsiflexion/plantarflexion and EHL. No clonus noted bilaterally. No pain upon palpation of greater trochanters. No pain with internal/external rotation of bilateral hips. Sensation intact bilaterally. Negative slump test bilaterally. Ambulates without aid, gait steady.  Skin:    General: Skin is warm and dry.     Capillary Refill: Capillary refill takes less than 2 seconds.  Neurological:     General: No focal deficit present.     Mental Status: She is alert and oriented to person, place, and time.  Psychiatric:        Mood and Affect: Mood normal.        Behavior: Behavior normal.     Ortho Exam  Imaging: No results found.  Past Medical/Family/Surgical/Social History: Medications & Allergies reviewed per EMR, new medications updated. Patient Active Problem List   Diagnosis Date Noted   Hepatitis B immune 08/09/2023   Seizure-like activity (HCC) 08/09/2021    Postmenopausal bleeding 06/02/2021   Genetic testing 04/21/2021   Family history of breast cancer    Family history of bone cancer    Family history of prostate cancer    Family history of throat cancer    Malignant neoplasm of upper-outer quadrant of left breast in female, 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   Women's annual routine gynecological examination 02/25/2021   Screening examination for STD (sexually transmitted disease) 02/25/2021   Skin lesion 02/25/2021   Chest pain at rest 05/30/2014   Ejection fraction    Abnormal EKG 04/24/2014   Chest discomfort 04/23/2014   Panic attack 04/23/2014   OBESITY 01/15/2009   HAIR LOSS 01/15/2009   ANEMIA-NOS 01/14/2009   Past Medical History:  Diagnosis Date   Anemia    Anginal pain    Breast cancer (HCC)    Family history of bone cancer    Family history of breast cancer    Family history of prostate cancer    Family history of throat cancer    Malignant neoplasm of upper-outer quadrant of left breast in female, 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   Family History  Problem Relation Age of Onset   Heart disease Mother    Hypertension Mother    Heart Problems Mother    Hyperlipidemia Mother    Breast cancer Mother 108   Diabetes Father    Seizures Father    Heart disease Sister    Crohn's disease Sister    Crohn's disease Brother    Hypertension Maternal Aunt    Colon cancer Maternal Uncle    Hypertension Maternal Uncle    Prostate cancer Maternal Uncle    Prostate cancer Maternal Uncle    Cancer Paternal Aunt        unknown type   Cancer Paternal Uncle        unknown type   Cancer Paternal Uncle        unknown type   Throat cancer Paternal Uncle    Hypertension Maternal Grandmother    Stroke Maternal Grandmother    Breast cancer Maternal Grandmother 16   Stroke Maternal Grandfather    Pancreatic cancer Cousin    Breast cancer Cousin        dx 17s, bilateral mastectomies (maternal first cousin)    Bone cancer Nephew 11       knee, great-nephew   Breast cancer Other        great-aunt (MGF's sister)   Breast cancer Other        first cousin once removed (MGF's niece)   Breast cancer Other        first cousin once removed (MGF's niece)   Cancer Other        unknown type, great-uncle (MGM's brother)   Breast cancer Other  first cousin once removed (MGM's niece)   Breast cancer Other        first cousin once removed (MGM's niece)   Prostate cancer Other        first cousin once removed (MGM's nephew)   Breast cancer Other        second cousin (MGM's great-niece)   Cancer Other        unknown type, dx 86s (maternal aunt's granddaughter)   Sickle cell anemia Other    Esophageal cancer Neg Hx    Stomach cancer Neg Hx    Rectal cancer Neg Hx    Past Surgical History:  Procedure Laterality Date   AXILLARY LYMPH NODE BIOPSY Left 04/29/2021   Procedure: AXILLARY LYMPH NODE BIOPSY;  Surgeon: Ebbie Cough, MD;  Location: [facility] OR;  Service: General;  Laterality: Left;   BREAST LUMPECTOMY WITH RADIOACTIVE SEED AND SENTINEL LYMPH NODE BIOPSY Left 04/29/2021   Procedure: LEFT BREAST LUMPECTOMY WITH RADIOACTIVE SEED;  Surgeon: Ebbie Cough, MD;  Location: [facility];  Service: General;  Laterality: Left;   CARDIAC CATHETERIZATION     LEFT HEART CATHETERIZATION WITH CORONARY ANGIOGRAM N/A 05/30/2014   Procedure: LEFT HEART CATHETERIZATION WITH CORONARY ANGIOGRAM;  Surgeon: Peter M Jordan, MD;  Location: [facility] CATH LAB;  Service: Cardiovascular;  Laterality: N/A;   TUBAL LIGATION     Social History   Occupational History   Occupation: Health And Safety Inspector: [employer]  Tobacco Use   Smoking status: Never   Smokeless tobacco: Never  Vaping Use   Vaping status: Never Used  Substance and Sexual Activity   Alcohol  use: No   Drug use: No   Sexual activity: Yes    Partners: Male    Birth control/protection: Surgical, Condom   "

## 2024-11-28 ENCOUNTER — Encounter: Admitting: Psychology

## 2024-11-30 ENCOUNTER — Encounter: Payer: Self-pay | Admitting: Family

## 2024-11-30 ENCOUNTER — Ambulatory Visit: Admitting: Family

## 2024-11-30 DIAGNOSIS — G8929 Other chronic pain: Secondary | ICD-10-CM

## 2024-11-30 DIAGNOSIS — M25561 Pain in right knee: Secondary | ICD-10-CM

## 2024-11-30 DIAGNOSIS — M222X1 Patellofemoral disorders, right knee: Secondary | ICD-10-CM | POA: Diagnosis not present

## 2024-11-30 NOTE — Progress Notes (Signed)
 "  Office Visit Note   Patient: Sheila [name]           Date of Birth: 23-Nov-1970           MRN: [id number] Visit Date: 11/30/2024              Requested by: Celestia Rosaline SQUIBB, NP [address] [place],  [place] [address] PCP: Celestia Rosaline SQUIBB, NP  Chief Complaint  Patient presents with   Right Knee - Pain      HPI: The patient is a 54 year old woman who presents today with a months to years history of right knee pain this has been worsening gradually she notices significant decrease in her ability to participate in IADLs.  She reports she cannot squat or a send descend inclines significant anterior medial knee pain.  Having giving way at the knee  Assessment & Plan: Visit Diagnoses: No diagnosis found.  Plan: mild oa on xray with patellofemoral pain. Mechanical symptoms present.   Offered Depo-Medrol  injection.  Patient declined.  Discussed physical therapy, she has completed formal physical therapy for the knee in the past and has not seen improvement declined repeat today. Reinforced VMO strengthening.  Will proceed with MRI of the right knee  Follow-Up Instructions: No follow-ups on file.   Right Knee Exam   Tenderness  The patient is experiencing tenderness in the medial joint line and patellar tendon.  Range of Motion  Right knee extension: weak extension, supports with her hand.  Flexion:  normal   Tests  Varus: negative Valgus: negative  Other  Erythema: absent Pulse: present Swelling: mild Effusion: effusion present      Patient is alert, oriented, no adenopathy, well-dressed, normal affect, normal respiratory effort.    Imaging: No results found. No images are attached to the encounter.  Labs: Lab Results  Component Value Date   HGBA1C 5.3 06/30/2020   ESRSEDRATE 17 04/03/2022   LABURIC 5.3 04/03/2022   REPTSTATUS 03/30/2020 FINAL 03/29/2020   CULT (A) 03/29/2020    >=100,000 COLONIES/mL MULTIPLE SPECIES PRESENT, SUGGEST  RECOLLECTION     Lab Results  Component Value Date   ALBUMIN 4.4 05/07/2022   ALBUMIN 4.2 01/16/2022   ALBUMIN 3.3 (L) 08/07/2021    No results found for: MG Lab Results  Component Value Date   VD25OH 13 (L) 05/11/2024    No results found for: PREALBUMIN    Latest Ref Rng & Units 04/16/2024    7:19 PM 01/02/2024   10:48 PM 01/02/2024   10:32 PM  CBC EXTENDED  WBC 4.0 - 10.5 K/uL 5.8   6.1   RBC 3.87 - 5.11 MIL/uL 4.38   4.26   Hemoglobin 12.0 - 15.0 g/dL 87.1  86.0  87.3   HCT 36.0 - 46.0 % 39.3  41.0  37.0   Platelets 150 - 400 K/uL 271   300   NEUT# 1.7 - 7.7 K/uL   4.1   Lymph# 0.7 - 4.0 K/uL   1.6      There is no height or weight on file to calculate BMI.  Orders:  No orders of the defined types were placed in this encounter.  No orders of the defined types were placed in this encounter.    Procedures: No procedures performed  Clinical Data: No additional findings.  ROS:  All other systems negative, except as noted in the HPI. Review of Systems  Objective: Vital Signs: LMP 04/03/2018   Specialty Comments:  CLINICAL DATA:  Initial evaluation  for lower back pain, lumbar radiculopathy.   EXAM: MRI LUMBAR SPINE WITHOUT CONTRAST   TECHNIQUE: Multiplanar, multisequence MR imaging of the lumbar spine was performed. No intravenous contrast was administered.   COMPARISON:  Prior radiograph from 09/26/2024.   FINDINGS: Segmentation: Standard. Lowest well-formed disc space labeled the L5-S1 level.   Alignment: 3 mm facet mediated anterolisthesis of L4 on L5. Alignment otherwise normal preservation of the normal lumbar lordosis.   Vertebrae: Vertebral body height maintained without acute or chronic fracture. Bone marrow signal intensity within normal limits. No worrisome osseous lesions. Mild degenerative reactive endplate changes noted about the anterior aspect of the T10-11 interspace. No other abnormal marrow edema.   Conus medullaris and  cauda equina: Conus extends to the T12-L1 level. Conus and cauda equina appear normal.   Paraspinal and other soft tissues: Unremarkable.   Disc levels:   T10-11 and T11-12: Seen only on sagittal projection. Mild disc bulge with endplate spurring. No stenosis.   T12-L1: Unremarkable.   L1-2:  Unremarkable.   L2-3: Mild annular disc bulge. No spinal stenosis. Foramina remain patent.   L3-4: Degenerative intervertebral disc space narrowing with disc desiccation and diffuse disc bulge. Superimposed small right foraminal disc protrusion closely approximates the right L3 nerve root (series 313, image 28). Moderate left greater than right facet hypertrophy. Resultant mild spinal stenosis. Mild bilateral L3 foraminal narrowing.   L4-5: Anterolisthesis. Disc desiccation with diffuse disc bulge. Moderate right worse than left facet arthrosis. No significant spinal stenosis. Mild bilateral L4 foraminal narrowing.   L5-S1: Disc desiccation with mild disc bulge. Moderate right worse than left facet arthrosis. No spinal stenosis. Moderate left L5 foraminal narrowing. Right neural foramina remains patent.   IMPRESSION: 1. Small right foraminal disc protrusion at L3-4, closely approximating and potentially irritating the right L3 nerve root. 2. Moderate left L5 foraminal stenosis related to disc bulge and facet arthrosis. 3. Moderate bilateral facet arthrosis at L3-4 through L5-S1. Findings could contribute to lower back pain.     Electronically Signed   By: Morene Hoard M.D.   On: 10/15/2024 00:05  PMFS History: Patient Active Problem List   Diagnosis Date Noted   Hepatitis B immune 08/09/2023   Seizure-like activity (HCC) 08/09/2021   Postmenopausal bleeding 06/02/2021   Genetic testing 04/21/2021   Family history of breast cancer    Family history of bone cancer    Family history of prostate cancer    Family history of throat cancer    Malignant neoplasm of  upper-outer quadrant of left breast in female, 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   Women's annual routine gynecological examination 02/25/2021   Screening examination for STD (sexually transmitted disease) 02/25/2021   Skin lesion 02/25/2021   Chest pain at rest 05/30/2014   Ejection fraction    Abnormal EKG 04/24/2014   Chest discomfort 04/23/2014   Panic attack 04/23/2014   OBESITY 01/15/2009   HAIR LOSS 01/15/2009   ANEMIA-NOS 01/14/2009   Past Medical History:  Diagnosis Date   Anemia    Anginal pain    Breast cancer (HCC)    Family history of bone cancer    Family history of breast cancer    Family history of prostate cancer    Family history of throat cancer    Malignant neoplasm of upper-outer quadrant of left breast in female, 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    Family History  Problem Relation Age of Onset   Heart disease Mother    Hypertension Mother  Heart Problems Mother    Hyperlipidemia Mother    Breast cancer Mother 24   Diabetes Father    Seizures Father    Heart disease Sister    Crohn's disease Sister    Crohn's disease Brother    Hypertension Maternal Aunt    Colon cancer Maternal Uncle    Hypertension Maternal Uncle    Prostate cancer Maternal Uncle    Prostate cancer Maternal Uncle    Cancer Paternal Aunt        unknown type   Cancer Paternal Uncle        unknown type   Cancer Paternal Uncle        unknown type   Throat cancer Paternal Uncle    Hypertension Maternal Grandmother    Stroke Maternal Grandmother    Breast cancer Maternal Grandmother 39   Stroke Maternal Grandfather    Pancreatic cancer Cousin    Breast cancer Cousin        dx 11s, bilateral mastectomies (maternal first cousin)   Bone cancer Nephew 11       knee, great-nephew   Breast cancer Other        great-aunt (MGF's sister)   Breast cancer Other        first cousin once removed (MGF's niece)   Breast cancer Other        first cousin once removed  (MGF's niece)   Cancer Other        unknown type, great-uncle (MGM's brother)   Breast cancer Other        first cousin once removed (MGM's niece)   Breast cancer Other        first cousin once removed (MGM's niece)   Prostate cancer Other        first cousin once removed (MGM's nephew)   Breast cancer Other        second cousin (MGM's great-niece)   Cancer Other        unknown type, dx 16s (maternal aunt's granddaughter)   Sickle cell anemia Other    Esophageal cancer Neg Hx    Stomach cancer Neg Hx    Rectal cancer Neg Hx     Past Surgical History:  Procedure Laterality Date   AXILLARY LYMPH NODE BIOPSY Left 04/29/2021   Procedure: AXILLARY LYMPH NODE BIOPSY;  Surgeon: Ebbie Cough, MD;  Location: [facility] OR;  Service: General;  Laterality: Left;   BREAST LUMPECTOMY WITH RADIOACTIVE SEED AND SENTINEL LYMPH NODE BIOPSY Left 04/29/2021   Procedure: LEFT BREAST LUMPECTOMY WITH RADIOACTIVE SEED;  Surgeon: Ebbie Cough, MD;  Location: [facility] OR;  Service: General;  Laterality: Left;   CARDIAC CATHETERIZATION     LEFT HEART CATHETERIZATION WITH CORONARY ANGIOGRAM N/A 05/30/2014   Procedure: LEFT HEART CATHETERIZATION WITH CORONARY ANGIOGRAM;  Surgeon: Peter M Jordan, MD;  Location: [facility] CATH LAB;  Service: Cardiovascular;  Laterality: N/A;   TUBAL LIGATION     Social History   Occupational History   Occupation: Health And Safety Inspector: [employer]  Tobacco Use   Smoking status: Never   Smokeless tobacco: Never  Vaping Use   Vaping status: Never Used  Substance and Sexual Activity   Alcohol  use: No   Drug use: No   Sexual activity: Yes    Partners: Male    Birth control/protection: Surgical, Condom       "

## 2024-12-04 ENCOUNTER — Other Ambulatory Visit: Payer: Self-pay

## 2024-12-04 ENCOUNTER — Encounter (HOSPITAL_COMMUNITY): Payer: Self-pay | Admitting: *Deleted

## 2024-12-04 ENCOUNTER — Ambulatory Visit (INDEPENDENT_AMBULATORY_CARE_PROVIDER_SITE_OTHER)

## 2024-12-04 ENCOUNTER — Ambulatory Visit (HOSPITAL_COMMUNITY)
Admission: EM | Admit: 2024-12-04 | Discharge: 2024-12-04 | Disposition: A | Discharge status: Home / Self Care | Attending: Family Medicine | Admitting: Family Medicine

## 2024-12-04 DIAGNOSIS — R079 Chest pain, unspecified: Secondary | ICD-10-CM

## 2024-12-04 DIAGNOSIS — R0602 Shortness of breath: Secondary | ICD-10-CM

## 2024-12-04 MED ORDER — CYCLOBENZAPRINE HCL 10 MG PO TABS: ORAL_TABLET | ORAL | 0 refills | Status: AC

## 2024-12-04 MED ORDER — METHYLPREDNISOLONE 4 MG PO TBPK: ORAL_TABLET | ORAL | 0 refills | Status: AC

## 2024-12-04 NOTE — ED Triage Notes (Signed)
 PT reports last night she started to have pain on Lt side of chest when breathing.  Pt reports increased pain when moving. Pt denies any injury.

## 2024-12-04 NOTE — Discharge Instructions (Signed)
You have been seen at the [facility] today for chest pain. Your evaluation today was not suggestive of any emergent condition requiring medical intervention at this time. Your chest x-ray and ECG (heart tracing) did not show any worrisome changes. However, some medical problems make take more time to appear. Therefore, it's very important that you pay attention to any new symptoms or worsening of your current condition.  Please proceed directly to the Emergency Department immediately should you feel worse in any way or have any of the following symptoms: increasing or different chest pain, pain that spreads to your arm, neck, jaw, back or abdomen, shortness of breath, or nausea and vomiting.  

## 2024-12-05 ENCOUNTER — Ambulatory Visit (INDEPENDENT_AMBULATORY_CARE_PROVIDER_SITE_OTHER): Payer: Self-pay | Admitting: Psychology

## 2024-12-05 DIAGNOSIS — R4189 Other symptoms and signs involving cognitive functions and awareness: Secondary | ICD-10-CM

## 2024-12-05 DIAGNOSIS — F4323 Adjustment disorder with mixed anxiety and depressed mood: Secondary | ICD-10-CM

## 2024-12-05 NOTE — Progress Notes (Signed)
" ° °  NEUROPSYCHOLOGY FEEDBACK SESSION [place]. [facility] [place]  [place] Department of Neurology  Date of Feedback Session: 12/05/2024  REASON FOR REFERRAL   Sheila [name] is a 54 year old, right-handed, Black female with 14 years of formal education. She was referred for neuropsychological evaluation by her neurologist, Darice Shivers, M.D., to assess current neurocognitive functioning, document potential cognitive deficits, and assist with treatment planning. This is her first neuropsychological evaluation.  FEEDBACK   Patient completed a comprehensive neuropsychological evaluation on 11/21/2024. Please refer to that encounter for the full report and recommendations. Briefly, the results could not be interpreted with a sufficient degree of clinical confidence due to scores below cutoff ranges on multiple measures of performance validity. That said, at a minimum, performance across most non-memory measures was generally consistent with expectations. At this time, it remains unclear why there is a mismatch between her test performance and her clinical presentation/daily functioning. Most importantly, she has several modifiable risk factors for cognitive impairment that may be particularly relevant to her current condition and functional status, including significant chronic pain with associated mood changes, sleep disturbance, and difficulty adjusting to unplanned retirement.  Today, the patient was unaccompanied during the telephone encounter. She participated from her residence, while I was in my office. The session focused on review of the results of her neuropsychological evaluation. She was given the opportunity to ask questions, all of which were addressed, and she was encouraged to reach out should additional questions arise. She was offered a mailed copy of her report but elected to access it through her medical record.  DISPOSITION   Patient will follow up with the referring provider,  Dr. Shivers. No follow-up neuropsychological testing was scheduled at this time. Please feel free to refer the patient for repeated evaluation if she shows a significant change in neurocognitive status.  SERVICE   This feedback session was conducted by Renda Beckwith, Psy.D. As the visit duration was ten minutes, no billing was submitted.  This report was generated using voice recognition software. While this document has been carefully reviewed, transcription errors may be present. I apologize in advance for any inconvenience. Please contact me if further clarification is needed.  "

## 2024-12-05 NOTE — ED Provider Notes (Signed)
 " [facility]   [id number] 12/04/24 Arrival Time: 1550  ASSESSMENT & PLAN:  1. Shortness of breath   2. Chest pain, unspecified type     ECG: Performed today and interpreted by me: no acute changes when compared to previous; no STEMI.  I have personally viewed the imaging studies ordered this visit. CXR: no acute changes; no pneumothorax.  Likely MSK. Trial of: Meds ordered this encounter  Medications   methylPREDNISolone  (MEDROL  DOSEPAK) 4 MG TBPK tablet    Sig: Take as directed.    Dispense:  1 each    Refill:  0   cyclobenzaprine  (FLEXERIL ) 10 MG tablet    Sig: Take 1 tablet by mouth 3 times daily as needed for muscle spasm. Warning: May cause drowsiness.    Dispense:  21 tablet    Refill:  0    Chest pain precautions given. Declines ED evaluation for troponin.  Reviewed expectations re: course of current medical issues. Questions answered. Outlined signs and symptoms indicating need for more acute intervention. Patient verbalized understanding. After Visit Summary given.   SUBJECTIVE:  History from: patient. Sheila [name] is a 54 y.o. female who presents with complaint of L-sided non-radiating dull chest pain; noted last evening, esp with taking deep breath and with certain torso movements. Did move a washing machine recently and questions relation. Maybe a little short of breath.  Tobacco Use History[1] Social History   Substance and Sexual Activity  Alcohol  Use No   Denies recreational drug use.    OBJECTIVE:  Vitals:   12/04/24 1725  BP: 126/85  Pulse: 86  Resp: 20  Temp: 98 F (36.7 C)  SpO2: 98%    General appearance: alert, oriented, no acute distress Eyes: PERRLA; EOMI; conjunctivae normal HENT: normocephalic; atraumatic Neck: supple with FROM Lungs: without labored respirations; speaks full sentences without difficulty; CTAB Heart: regular rate and rhythm without murmer Chest Wall: with tenderness to palpation over  ULSB Abdomen: soft, non-tender; no guarding or rebound tenderness Extremities: without edema; without calf swelling or tenderness; symmetrical without gross deformities Skin: warm and dry; without rash or lesions Neuro: normal gait Psychological: alert and cooperative; normal mood and affect  Labs:  Labs Reviewed - No data to display  Imaging: DG Chest 2 View Result Date: 12/04/2024 CLINICAL DATA:  Chest pain and shortness of breath. EXAM: CHEST - 2 VIEW COMPARISON:  Chest radiograph dated 04/16/2024. FINDINGS: The heart size and mediastinal contours are within normal limits. Both lungs are clear. The visualized skeletal structures are unremarkable. IMPRESSION: No active cardiopulmonary disease. Electronically Signed   By: Vanetta Chou M.D.   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   Social History   Socioeconomic History   Marital status: Married    Spouse name: Not on file   Number of children: 4   Years of education: Not on file   Highest education level: GED or equivalent  Occupational History   Occupation: Health And Safety Inspector: [employer]  Tobacco Use   Smoking status: Never   Smokeless  tobacco: Never  Vaping Use   Vaping status: Never Used  Substance and Sexual Activity   Alcohol  use: No   Drug use: No   Sexual activity: Yes    Partners: Male    Birth control/protection: Surgical, Condom  Other Topics Concern   Not on file  Social History Narrative   Are you right handed or left handed? Right    Are you currently employed ? No    What is your current occupation?   Do you live at home alone?no   Who lives  with you? Lives with son    What type of home do you live in: 1 story or 2 story? 1 story        Social Drivers of Health   Tobacco Use: Low Risk (12/04/2024)   Patient History    Smoking Tobacco Use: Never    Smokeless Tobacco Use: Never    Passive Exposure: Not on file  Financial Resource Strain: Low Risk (09/11/2024)   Overall Financial Resource Strain (CARDIA)    Difficulty of Paying Living Expenses: Not hard at all  Food Insecurity: No Food Insecurity (09/11/2024)   Epic    Worried About Programme Researcher, Broadcasting/film/video in the Last Year: Never true    Ran Out of Food in the Last Year: Never true  Transportation Needs: No Transportation Needs (09/11/2024)   Epic    Lack of Transportation (Medical): No    Lack of Transportation (Non-Medical): No  Physical Activity: Insufficiently Active (09/11/2024)   Exercise Vital Sign    Days of Exercise per Week: 3 days    Minutes of Exercise per Session: 10 min  Stress: No Stress Concern Present (09/11/2024)   [employer] of Occupational Health - Occupational Stress Questionnaire    Feeling of Stress: Not at all  Social Connections: Moderately Integrated (09/11/2024)   Social Connection and Isolation Panel    Frequency of Communication with Friends and Family: More than three times a week    Frequency of Social Gatherings with Friends and Family: More than three times a week    Attends Religious Services: More than 4 times per year    Active Member of [employer] or Organizations: No    Attends Banker Meetings: Not on file    Marital Status: Married  Catering Manager Violence: Not At Risk (11/18/2023)   Humiliation, Afraid, Rape, and Kick questionnaire    Fear of Current or Ex-Partner: No    Emotionally Abused: No    Physically Abused: No    Sexually Abused: No  Depression (PHQ2-9): Low Risk (09/27/2024)   Depression (PHQ2-9)    PHQ-2 Score: 2  Alcohol  Screen: Low Risk (11/16/2023)   Alcohol  Screen    Last Alcohol  Screening Score  (AUDIT): 0  Housing: Low Risk (09/11/2024)   Epic    Unable to Pay for Housing in the Last Year: No    Number of Times Moved in the Last Year: 0    Homeless in the Last Year: No  Utilities: At Risk (11/18/2023)   AHC Utilities    Threatened with loss of utilities: Yes  Health Literacy: Not on file   Family History  Problem Relation Age of Onset   Heart disease Mother    Hypertension Mother    Heart Problems Mother    Hyperlipidemia Mother    Breast cancer Mother 23   Diabetes Father    Seizures Father    Heart disease Sister    Crohn's disease Sister  Crohn's disease Brother    Hypertension Maternal Aunt    Colon cancer Maternal Uncle    Hypertension Maternal Uncle    Prostate cancer Maternal Uncle    Prostate cancer Maternal Uncle    Cancer Paternal Aunt        unknown type   Cancer Paternal Uncle        unknown type   Cancer Paternal Uncle        unknown type   Throat cancer Paternal Uncle    Hypertension Maternal Grandmother    Stroke Maternal Grandmother    Breast cancer Maternal Grandmother 9   Stroke Maternal Grandfather    Pancreatic cancer Cousin    Breast cancer Cousin        dx 42s, bilateral mastectomies (maternal first cousin)   Bone cancer Nephew 11       knee, great-nephew   Breast cancer Other        great-aunt (MGF's sister)   Breast cancer Other        first cousin once removed (MGF's niece)   Breast cancer Other        first cousin once removed (MGF's niece)   Cancer Other        unknown type, great-uncle (MGM's brother)   Breast cancer Other        first cousin once removed (MGM's niece)   Breast cancer Other        first cousin once removed (MGM's niece)   Prostate cancer Other        first cousin once removed (MGM's nephew)   Breast cancer Other        second cousin (MGM's great-niece)   Cancer Other        unknown type, dx 32s (maternal aunt's granddaughter)   Sickle cell anemia Other    Esophageal cancer Neg Hx    Stomach cancer  Neg Hx    Rectal cancer Neg Hx    Past Surgical History:  Procedure Laterality Date   AXILLARY LYMPH NODE BIOPSY Left 04/29/2021   Procedure: AXILLARY LYMPH NODE BIOPSY;  Surgeon: Ebbie Cough, MD;  Location: [facility] OR;  Service: General;  Laterality: Left;   BREAST LUMPECTOMY WITH RADIOACTIVE SEED AND SENTINEL LYMPH NODE BIOPSY Left 04/29/2021   Procedure: LEFT BREAST LUMPECTOMY WITH RADIOACTIVE SEED;  Surgeon: Ebbie Cough, MD;  Location: [facility] OR;  Service: General;  Laterality: Left;   CARDIAC CATHETERIZATION     LEFT HEART CATHETERIZATION WITH CORONARY ANGIOGRAM N/A 05/30/2014   Procedure: LEFT HEART CATHETERIZATION WITH CORONARY ANGIOGRAM;  Surgeon: Peter M Jordan, MD;  Location: [facility] CATH LAB;  Service: Cardiovascular;  Laterality: N/A;   TUBAL LIGATION         [1]  Social History Tobacco Use  Smoking Status Never  Smokeless Tobacco Never  [2]  Allergies Allergen Reactions   Anesthetics, Amide Other (See Comments)    seizures   Other Other (See Comments)    General Anesthesia caused seizures-Propofol    Propofol  Anaphylaxis     Rolinda Rogue, MD 12/05/24 [phone]  "

## 2024-12-11 ENCOUNTER — Ambulatory Visit: Admitting: Gastroenterology

## 2024-12-11 ENCOUNTER — Other Ambulatory Visit

## 2024-12-11 ENCOUNTER — Encounter: Payer: Self-pay | Admitting: Gastroenterology

## 2024-12-11 ENCOUNTER — Ambulatory Visit: Payer: Self-pay | Admitting: Gastroenterology

## 2024-12-11 VITALS — BP 122/70 | HR 82 | Ht 69.0 in | Wt 200.0 lb

## 2024-12-11 DIAGNOSIS — R110 Nausea: Secondary | ICD-10-CM

## 2024-12-11 DIAGNOSIS — R1032 Left lower quadrant pain: Secondary | ICD-10-CM

## 2024-12-11 DIAGNOSIS — Z8719 Personal history of other diseases of the digestive system: Secondary | ICD-10-CM

## 2024-12-11 DIAGNOSIS — Z860100 Personal history of colon polyps, unspecified: Secondary | ICD-10-CM

## 2024-12-11 DIAGNOSIS — R12 Heartburn: Secondary | ICD-10-CM | POA: Diagnosis not present

## 2024-12-11 DIAGNOSIS — Z853 Personal history of malignant neoplasm of breast: Secondary | ICD-10-CM | POA: Diagnosis not present

## 2024-12-11 DIAGNOSIS — R10A2 Flank pain, left side: Secondary | ICD-10-CM | POA: Diagnosis not present

## 2024-12-11 DIAGNOSIS — K5909 Other constipation: Secondary | ICD-10-CM | POA: Diagnosis not present

## 2024-12-11 LAB — COMPREHENSIVE METABOLIC PANEL WITH GFR
ALT: 13 U/L (ref 3–35)
AST: 17 U/L (ref 5–37)
Albumin: 4.4 g/dL (ref 3.5–5.2)
Alkaline Phosphatase: 65 U/L (ref 39–117)
BUN: 9 mg/dL (ref 6–23)
CO2: 26 meq/L (ref 19–32)
Calcium: 9.5 mg/dL (ref 8.4–10.5)
Chloride: 106 meq/L (ref 96–112)
Creatinine, Ser: 0.85 mg/dL (ref 0.40–1.20)
GFR: 76.84 mL/min
Glucose, Bld: 88 mg/dL (ref 70–99)
Potassium: 4.1 meq/L (ref 3.5–5.1)
Sodium: 140 meq/L (ref 135–145)
Total Bilirubin: 1.3 mg/dL — ABNORMAL HIGH (ref 0.2–1.2)
Total Protein: 7.4 g/dL (ref 6.0–8.3)

## 2024-12-11 LAB — CBC WITH DIFFERENTIAL/PLATELET
Basophils Absolute: 0.1 10*3/uL (ref 0.0–0.1)
Basophils Relative: 1.2 % (ref 0.0–3.0)
Eosinophils Absolute: 0.1 10*3/uL (ref 0.0–0.7)
Eosinophils Relative: 1.5 % (ref 0.0–5.0)
HCT: 39.4 % (ref 36.0–46.0)
Hemoglobin: 13.3 g/dL (ref 12.0–15.0)
Lymphocytes Relative: 27.1 % (ref 12.0–46.0)
Lymphs Abs: 1.2 10*3/uL (ref 0.7–4.0)
MCHC: 33.7 g/dL (ref 30.0–36.0)
MCV: 86.9 fl (ref 78.0–100.0)
Monocytes Absolute: 0.3 10*3/uL (ref 0.1–1.0)
Monocytes Relative: 7.5 % (ref 3.0–12.0)
Neutro Abs: 2.7 10*3/uL (ref 1.4–7.7)
Neutrophils Relative %: 62.7 % (ref 43.0–77.0)
Platelets: 255 10*3/uL (ref 150.0–400.0)
RBC: 4.54 Mil/uL (ref 3.87–5.11)
RDW: 13 % (ref 11.5–15.5)
WBC: 4.3 10*3/uL (ref 4.0–10.5)

## 2024-12-11 LAB — LIPASE: Lipase: 14 U/L (ref 11.0–59.0)

## 2024-12-11 MED ORDER — ONDANSETRON 4 MG PO TBDP: 4.0000 mg | ORAL_TABLET | Freq: Every day | ORAL | 0 refills | Status: AC | PRN

## 2024-12-11 NOTE — Progress Notes (Signed)
 Noted.

## 2024-12-11 NOTE — Patient Instructions (Addendum)
 Constipation Take OTC Miralax  po daily  Drink plenty of water  GERD OTC Pepcid as needed Recommend GERD diet , no late meals 3-4 hours before lying down  Diverticulitis Pamphlet attached Advised to go to the ER if there is any severe abdominal pain, unable to hold down food/water, blood in stool or vomit, chest pain, shortness of breath, or any worsening symptoms.    Your provider has requested that you go to the basement level for lab work before leaving today. Press B on the elevator. The lab is located at the first door on the left as you exit the elevator.  You have been scheduled for a CT scan of the abdomen and pelvis at [facility], 1st floor Radiology. You are scheduled on 12/12/24 at 8:00am. You should arrive 15 minutes prior to your appointment time for registration.    You may take any medications as prescribed with a small amount of water, if necessary. If you take any of the following medications: METFORMIN, GLUCOPHAGE, GLUCOVANCE, AVANDAMET, RIOMET, FORTAMET, ACTOPLUS MET, JANUMET, GLUMETZA or METAGLIP, you MAY be asked to HOLD this medication 48 hours AFTER the exam.   The purpose of you drinking the oral contrast is to aid in the visualization of your intestinal tract. The contrast solution may cause some diarrhea. Depending on your individual set of symptoms, you may also receive an intravenous injection of x-ray contrast/dye. Plan on being at [facility] for 45 minutes or longer, depending on the type of exam you are having performed.   If you have any questions regarding your exam or if you need to reschedule, you may call [facility] at [phone] between the hours of 8:00 am and 5:00 pm, Monday-Friday.   _______________________________________________________  If your blood pressure at your visit was 140/90 or greater, please contact your primary care physician to follow up on this.  _______________________________________________________  If you  are age 55 or older, your body mass index should be between 23-30. Your Body mass index is 29.53 kg/m. If this is out of the aforementioned range listed, please consider follow up with your Primary Care Provider.  If you are age 72 or younger, your body mass index should be between 19-25. Your Body mass index is 29.53 kg/m. If this is out of the aformentioned range listed, please consider follow up with your Primary Care Provider.   ________________________________________________________  The [facility] providers would like to encourage you to use MYCHART to communicate with providers for non-urgent requests or questions.  Due to long hold times on the telephone, sending your provider a message by [facility] may be a faster and more efficient way to get a response.  Please allow 48 business hours for a response.  Please remember that this is for non-urgent requests.  _______________________________________________________  [facility] is using a team-based approach to care.  Your team is made up of your doctor and two to three APPS. Our APPS (Nurse Practitioners and Physician Assistants) work with your physician to ensure care continuity for you. They are fully qualified to address your health concerns and develop a treatment plan. They communicate directly with your gastroenterologist to care for you. Seeing the Advanced Practice Practitioners on your physician's team can help you by facilitating care more promptly, often allowing for earlier appointments, access to diagnostic testing, procedures, and other specialty referrals.   Thank you for trusting me with your gastrointestinal care. Deanna May, FNP-C

## 2024-12-12 ENCOUNTER — Other Ambulatory Visit: Payer: Self-pay

## 2024-12-12 ENCOUNTER — Encounter: Payer: Self-pay | Admitting: Internal Medicine

## 2024-12-12 ENCOUNTER — Ambulatory Visit (HOSPITAL_COMMUNITY)

## 2024-12-13 ENCOUNTER — Ambulatory Visit (HOSPITAL_COMMUNITY)

## 2024-12-13 ENCOUNTER — Telehealth (HOSPITAL_BASED_OUTPATIENT_CLINIC_OR_DEPARTMENT_OTHER): Payer: Self-pay | Admitting: Licensed Clinical Social Worker

## 2024-12-13 NOTE — Telephone Encounter (Signed)
 H&V Care Navigation CSW Progress Note  Clinical Social Worker contacted patient by phone to f/u on myChart message. Pt had reached out to clinic with concern related to housing. Pt clarified that currently has Section 8 voucher- due to past due/disconnected utilities they are threatening to discontinue her voucher if not turned on within 24 hours.  Confirmed from attachments pt received a notice on 12/12/24 from housing authority stating that they were made aware that [employer] had cut their lights off on 12/10/24 and had 24 hours to get lights turned back on. I called pt at [phone], introduced self, role, reason for call. Confirmed full name and DOB- when I speak with pt she indicated that the gas has been past due since October ($2,513). But that their electricity is still on. Encouraged her to contact housing authority to clarify what is needed and any options for appeal since letter notes they are able to appeal this notice.   Unfortunately once we clarified amount past due I let pt know we would not be able to provide any immediate assistance. Our Patient Care Fund Guidelines don't allow us  to assist once something has been disconnected, and any exceptions that could be made would need proof that they had assistance for at least 1,500 or so of the balance before we could chip in. Encouraged her to bring notice and bill to DSS to discuss LIEAP funding and pt is agreeable to me sending brief info and her contact to [facility] for Housing and [employer] for additional advocacy.    Patient is participating in a Managed Medicaid Plan:  Yes  SDOH Screenings   Food Insecurity: No Food Insecurity (09/11/2024)  Housing: High Risk (12/13/2024)  Transportation Needs: No Transportation Needs (09/11/2024)  Utilities: At Risk (12/13/2024)  Alcohol  Screen: Low Risk (11/16/2023)  Depression (PHQ2-9): Low Risk (09/27/2024)  Financial Resource Strain: Low Risk (09/11/2024)  Physical Activity:  Insufficiently Active (09/11/2024)  Social Connections: Moderately Integrated (09/11/2024)  Stress: No Stress Concern Present (09/11/2024)  Tobacco Use: Low Risk (12/11/2024)    Marit Lark, MSW, LCSW Clinical Social Worker [facility] Care Navigation  [phone]- work cell phone (preferred)

## 2024-12-14 ENCOUNTER — Ambulatory Visit: Admitting: Neurology

## 2024-12-14 NOTE — Progress Notes (Unsigned)
 "  NEUROLOGY FOLLOW UP OFFICE NOTE  Ronae Noell [id number] 01/12/1971  HISTORY OF PRESENT ILLNESS: I had the pleasure of seeing Gavin Faivre in follow-up in the neurology clinic on 12/14/2024.  The patient was last seen 4 months ago for memory loss, headaches, and blackouts. and is accompanied by *** today.  Records and images were personally reviewed where available.  Amtriptyline 25mg  at bedtime for daily headaches started  Since her last visit, she underwent Neuropsychological testing earlier this month  Results of the current evaluation could not be interpreted with a sufficient degree of clinical confidence due to scores below cutoff ranges on multiple measures of performance validity.  she demonstrated notable difficulty responding to questions and providing autobiographical information, challenges that would be unusual even for individuals with significant cognitive impairment. Additionally, her level of daily functioning appears more impaired than would be expected based on her performance in most cognitive domains, even when considering those scores as minimal estimates of ability.  In summary, definitive conclusions regarding neurocognitive diagnosis or etiology cannot be drawn from this evaluation alone. Clinical decisions should be guided by a comprehensive review of historical, contextual, and corroborating information. Most importantly, she has several modifiable risk factors for cognitive impairment that may be particularly relevant to her current condition and daily functioning, including significant chronic pain accompanied by mood changes, sleep disturbance, and difficulty adjusting to unplanned retirement.  .  I had the pleasure of seeing Merrie Epler in follow-up in the neurology clinic on 08/13/2024.  The patient was last seen 3 months ago for memory loss, headaches, blackouts. She is alone in the office today. Records and images were personally reviewed where available.  TSH and B12 were normal, vitamin D  level was very low 13. She was prescribed high dose replacement therapy. I personally reviewed brain MRI with and without contrast 06/2024 which did not show any acute changes. 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 Her 1-hour EEG in 05/2024 was normal, 48-hour ambulatory EEG in 07/2024 was normal, typical events not reported.   She reports episodes where she feels like she will black out, she lays down and goes to sleep. Her daughter tells her she is asleep for 30 minutes. She had a heart monitor for 3 days in 2024 noting symptoms correlate with infrequent ventricular ectopy. She continues to see Cardiology and does not want to repeat the heart monitor, stating she is taking her heart medication. She has headaches all the time and currently feels like her head is starting to clog up. Vision is blurred. She reports a tender spot on the right parietal region, she feels there is a dent in her skull. She was previously on amitriptyline  in 2023. She has times she cannot breathe and wonders about panic attacks, she denies any anxiety. She does not sleep well with her CPAP machine. She states she has not taken her cancer medication, I don't remember to take anything. She lives with her son, her daughter comes to administer her medications. She does not drive. No recent falls, she has right knee pain.    History on Initial Assessment 05/11/2024: This is a pleasant 54 year old right-handed woman with a history of breast cancer s/p lumpectomy and radiation, palpitations, presenting for evaluation of seizure-like activity. She reports several symptoms, including memory loss, bad headaches, and blacking out. She reports all these symptoms started  after colonoscopy in 2022. Since then, things have been different. She repeatedly  states I want my life back. She was previously independent with all ADLs, however since 2022 her daughter who is her POA has been managing finances, meals. She stopped driving as well, she was getting lost and could not remember where she was going, having to call her family for help. She manages her medications but reports there is a medication her oncologist prescribed but she has not been taking it because she does not remember being told to take it. Her last visit with Dr. Lanny was in 2023. She does not cook anymore, she had forgotten something on the stove. She states her daughter does everything for me. She lives with her disabled son. Her children have also mentioned forgetfulness. Her maternal grandmother had dementia.   She denies any history of headaches prior to the colonoscopy. Since then, she has had headaches with pressure in the right parietal and left occipital region, like it's clouded. There is some nausea and light sensitivity. For the past couple of months, she has been taking Tylenol  2 tablets daily for headaches that last for hours. It eases but does not fully relieve the pain. She notices the pressure when she gets up in the morning. She is always lightheaded, she has seen Cardiology and had a holter monitor in 11/2022 where it was noted that symptoms correlate with infrequent ventricular ectopy. She reports feeling the palpitations and feels like she needs more oxygen. She would be sitting and feel like it is hard to breathe. On last Cardiology visit in 12/2023, it was felt that she is having severe daytime somnolence with frequent episodes of falling asleep. She still reported apneic spells even with her CPAP machine. She was advised to follow-up with her Sleep doctor, however she is not sure who is managing her CPAP. She saw Pulmonary in 02/2024 and is scheduled for PFTs next week. She states she cannot walk far, getting short of breath.   She states she keeps blacking out, she would  be doing something then a certain part of her body can't move, then she wakes up on the ground with EMS around her. She does not recall any tongue bite or incontinence. She states these were initially happening all the time but have quieted down, last passing out was 1-2 months ago. She has been told she is staring off sometimes. She denies any olfactory/gustatory hallucinations or myoclonic jerks. She has numbness and tingling in fingers of both hands. She has had left-sided neck pain for the past couple of weeks. She has pain in her right knee, denies any falls. She was in the ER for this and started on Prednisone . She reports mood is good.  On review of records from 2022, she was evaluated by inpatient Neurology after having 2 seizures with generalized body shaking while in the recovery unit after colonoscopy. EMS was called and she was given benzodiazepines then brought to the ER. In the ER, she had another couple of seizures and was very somnolent after receiving a total of 9mg  Versed . 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 She had a routine and 24-hour EEG which were normal, typical events were not captured. I personally reviewed MRI brain with and without contrast done 07/2021 which did not show any acute changes, there was minimal chronic microvascular disease, chronic lacunar infarct  in the right pons. She was evaluated by epileptologist Dr. Gregg in 10/2021 for memory loss and seizure. MMSE was 29/30. EEG in 11/2021 was normal. She was reporting headaches and started on amitriptyline  but unclear if she took it. On last visit in 06/2022, she was started on Citalopram , she is not taking this medication. She was in the ER in 12/2023 for headache, right-sided burning sensation. She got up and had vertigo, lightheadedness, headache, burning on the right  side. She then passed out per EMS and fell and hit her head, head CT no acute changes.   She reports a strong family history of seizures, her father had many seizures and died from them. Her son, daughter (has pseudoseizures), and nephew have seizures. She had a normal birth and early development.  There is no history of febrile convulsions, CNS infections such as meningitis/encephalitis, significant traumatic brain injury, neurosurgical procedures  PAST MEDICAL HISTORY: Past Medical History:  Diagnosis Date   Anemia    Anginal pain    Arthritis    in legs and back   Breast cancer (HCC)    Diverticulitis    Family history of bone cancer    Family history of breast cancer    Family history of prostate cancer    Family history of throat cancer    Malignant neoplasm of upper-outer quadrant of left breast in female, 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    MEDICATIONS: Medications Ordered Prior to Encounter[1]  ALLERGIES: Allergies[2]  FAMILY HISTORY: Family History  Problem Relation Age of Onset   Heart disease Mother    Hypertension Mother    Heart Problems Mother    Hyperlipidemia Mother    Breast cancer Mother 6   Diabetes Father    Seizures Father    Heart disease Sister    Crohn's disease Sister    Crohn's disease Brother    Hypertension Maternal Aunt    Colon cancer Maternal Uncle    Hypertension Maternal Uncle    Prostate cancer Maternal Uncle    Prostate cancer Maternal Uncle    Cancer Paternal Aunt        unknown type   Cancer Paternal Uncle        unknown type   Cancer Paternal Uncle        unknown type   Throat cancer Paternal Uncle    Hypertension Maternal Grandmother    Stroke Maternal Grandmother    Breast cancer Maternal Grandmother 11   Stroke Maternal Grandfather    Pancreatic cancer Cousin    Breast cancer Cousin        dx 43s, bilateral mastectomies (maternal first cousin)   Bone cancer Nephew 11       knee, great-nephew   Breast  cancer Other        great-aunt (MGF's sister)   Breast cancer Other        first cousin once removed (MGF's niece)   Breast cancer Other        first cousin once removed (MGF's niece)   Cancer Other        unknown type, great-uncle (MGM's brother)   Breast cancer Other        first cousin once removed (MGM's niece)   Breast cancer Other        first cousin once removed (MGM's niece)   Prostate cancer Other        first cousin once removed (MGM's nephew)   Breast cancer Other  second cousin (MGM's great-niece)   Cancer Other        unknown type, dx 106s (maternal aunt's granddaughter)   Sickle cell anemia Other    Esophageal cancer Neg Hx    Stomach cancer Neg Hx    Rectal cancer Neg Hx     SOCIAL HISTORY: Social History   Socioeconomic History   Marital status: Married    Spouse name: Not on file   Number of children: 4   Years of education: Not on file   Highest education level: GED or equivalent  Occupational History   Not on file  Tobacco Use   Smoking status: Never   Smokeless tobacco: Never  Vaping Use   Vaping status: Never Used  Substance and Sexual Activity   Alcohol  use: No   Drug use: No   Sexual activity: Yes    Partners: Male    Birth control/protection: Surgical, Condom  Other Topics Concern   Not on file  Social History Narrative   Are you right handed or left handed? Right    Are you currently employed ? No    What is your current occupation?   Do you live at home alone?no   Who lives with you? Lives with son    What type of home do you live in: 1 story or 2 story? 1 story        Social Drivers of Health   Tobacco Use: Low Risk (12/11/2024)   Patient History    Smoking Tobacco Use: Never    Smokeless Tobacco Use: Never    Passive Exposure: Not on file  Financial Resource Strain: Low Risk (09/11/2024)   Overall Financial Resource Strain (CARDIA)    Difficulty of Paying Living Expenses: Not hard at all  Food Insecurity: No Food  Insecurity (09/11/2024)   Epic    Worried About Programme Researcher, Broadcasting/film/video in the Last Year: Never true    Ran Out of Food in the Last Year: Never true  Transportation Needs: No Transportation Needs (09/11/2024)   Epic    Lack of Transportation (Medical): No    Lack of Transportation (Non-Medical): No  Physical Activity: Insufficiently Active (09/11/2024)   Exercise Vital Sign    Days of Exercise per Week: 3 days    Minutes of Exercise per Session: 10 min  Stress: No Stress Concern Present (09/11/2024)   [employer] of Occupational Health - Occupational Stress Questionnaire    Feeling of Stress: Not at all  Social Connections: Moderately Integrated (09/11/2024)   Social Connection and Isolation Panel    Frequency of Communication with Friends and Family: More than three times a week    Frequency of Social Gatherings with Friends and Family: More than three times a week    Attends Religious Services: More than 4 times per year    Active Member of [employer] or Organizations: No    Attends Banker Meetings: Not on file    Marital Status: Married  Intimate Partner Violence: Not At Risk (11/18/2023)   Humiliation, Afraid, Rape, and Kick questionnaire    Fear of Current or Ex-Partner: No    Emotionally Abused: No    Physically Abused: No    Sexually Abused: No  Depression (PHQ2-9): Low Risk (09/27/2024)   Depression (PHQ2-9)    PHQ-2 Score: 2  Alcohol  Screen: Low Risk (11/16/2023)   Alcohol  Screen    Last Alcohol  Screening Score (AUDIT): 0  Housing: High Risk (12/13/2024)   Epic  Unable to Pay for Housing in the Last Year: Yes    Number of Times Moved in the Last Year: 1    Homeless in the Last Year: No  Utilities: At Risk (12/13/2024)   Epic    Threatened with loss of utilities: Yes  Health Literacy: Not on file     PHYSICAL EXAM: There were no vitals filed for this visit. General: No acute distress Head:  Normocephalic/atraumatic Skin/Extremities: No rash, no  edema Neurological Exam: alert and oriented to person, place, and time. No aphasia or dysarthria. Fund of knowledge is appropriate.  Recent and remote memory are intact.  Attention and concentration are normal.   Cranial nerves: Pupils equal, round. Extraocular movements intact with no nystagmus. Visual fields full.  No facial asymmetry.  Motor: Bulk and tone normal, muscle strength 5/5 throughout with no pronator drift.   Finger to nose testing intact.  Gait narrow-based and steady, able to tandem walk adequately.  Romberg negative.   IMPRESSION: This is a pleasant 54 yo RH woman with a history of breast cancer s/p lumpectomy and radiation, palpitations,with memory loss, headaches, and blackouts since colonoscopy in 2022. 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 Overnight EEG normal, MRI brain no acute changes in 2022. MRI brain no acute changes, ambulatory 48-hour EEG normal. She continues to report daily headaches and sleep difficulties, and agrees to start amitriptyline  25mg  at bedtime, side effects discussed. Repeat vitamin D  level. Schedule Neurocognitive testing to further evaluate cognitive concerns. She was advised to keep a calendar of headaches. Follow-up in 4 months, call for any changes.    Thank you for allowing me to participate in *** care.  Please do not hesitate to call for any questions or concerns.  The duration of this appointment visit was *** minutes of face-to-face time with the patient.  Greater than 50% of this time was spent in counseling, explanation of diagnosis, planning of further management, and coordination of care.   Darice Shivers, M.D.   CC: ***      [1]  Current Outpatient Medications on File Prior to Visit  Medication Sig Dispense Refill   albuterol  (VENTOLIN  HFA) 108 (90 Base) MCG/ACT inhaler Inhale 2 puffs into the lungs every 6 (six) hours as needed for wheezing or shortness of  breath. 8 g 6   budesonide -formoterol  (SYMBICORT ) 160-4.5 MCG/ACT inhaler Inhale 2 puffs into the lungs 2 (two) times daily. 1 each 12   cyclobenzaprine  (FLEXERIL ) 10 MG tablet Take 1 tablet by mouth 3 times daily as needed for muscle spasm. Warning: May cause drowsiness. 21 tablet 0   DULoxetine  (CYMBALTA ) 30 MG capsule Take 1 capsule (30 mg total) once a day by mouth for 2 weeks, then take 1 capsule (30 mg) twice a day. 60 capsule 1   methylPREDNISolone  (MEDROL  DOSEPAK) 4 MG TBPK tablet Take as directed. 1 each 0   metoprolol  tartrate (LOPRESSOR ) 50 MG tablet Take 1 tablet (50 mg total) by mouth 2 (two) times daily. 180 tablet 3   ondansetron  (ZOFRAN -ODT) 4 MG disintegrating tablet Take 1 tablet (4 mg total) by mouth daily as needed for nausea or vomiting. 20 tablet 0   Tiotropium Bromide Monohydrate  (SPIRIVA  RESPIMAT) 2.5 MCG/ACT AERS Inhale 2 puffs into the lungs daily. 1 each 11   No current facility-administered medications on file prior to visit.  [2]  Allergies Allergen Reactions   Anesthetics, Amide Other (See Comments)    seizures  Other Other (See Comments)    General Anesthesia caused seizures-Propofol    Propofol  Anaphylaxis   "

## 2024-12-21 ENCOUNTER — Telehealth: Payer: Self-pay | Admitting: Licensed Clinical Social Worker

## 2024-12-21 ENCOUNTER — Encounter (HOSPITAL_COMMUNITY): Payer: Self-pay

## 2024-12-21 ENCOUNTER — Ambulatory Visit (HOSPITAL_COMMUNITY)

## 2024-12-21 NOTE — Telephone Encounter (Signed)
 H&V Care Navigation CSW Progress Note  Clinical Social Worker received an update from [employer]. I had played phone tag with this lady, but did finally speak to her, and she has actually been able to get the majority of the bill paid.  Since she was only a few hundred dollars short, I was able to refer her to two churches that will have their monthly funding next week and should be able to assist her.   LCSW team remains available as needed.   Patient is participating in a Managed Medicaid Plan:  Yes  SDOH Screenings   Food Insecurity: No Food Insecurity (09/11/2024)  Housing: High Risk (12/13/2024)  Transportation Needs: No Transportation Needs (09/11/2024)  Utilities: At Risk (12/13/2024)  Alcohol  Screen: Low Risk (11/16/2023)  Depression (PHQ2-9): Low Risk (09/27/2024)  Financial Resource Strain: Low Risk (09/11/2024)  Physical Activity: Insufficiently Active (09/11/2024)  Social Connections: Moderately Integrated (09/11/2024)  Stress: No Stress Concern Present (09/11/2024)  Tobacco Use: Low Risk (12/11/2024)    Marit Lark, MSW, LCSW Clinical Social Worker II [facility] Care Navigation  [phone]- work cell phone (preferred)

## 2025-01-02 ENCOUNTER — Ambulatory Visit (HOSPITAL_COMMUNITY)

## 2025-06-18 ENCOUNTER — Ambulatory Visit: Payer: Self-pay | Admitting: Neurology
